# Patient Record
Sex: Female | Born: 1960 | Race: Black or African American | Hispanic: No | Marital: Single | Smoking: Former smoker
Health system: Southern US, Community
[De-identification: ages and names within clinical notes are randomized; demographics above are authoritative.]

## PROBLEM LIST (undated history)

## (undated) DIAGNOSIS — I10 Essential (primary) hypertension: Secondary | ICD-10-CM

## (undated) DIAGNOSIS — E119 Type 2 diabetes mellitus without complications: Secondary | ICD-10-CM

## (undated) DIAGNOSIS — H35039 Hypertensive retinopathy, unspecified eye: Secondary | ICD-10-CM

## (undated) DIAGNOSIS — E1140 Type 2 diabetes mellitus with diabetic neuropathy, unspecified: Secondary | ICD-10-CM

## (undated) HISTORY — PX: FOOT SURGERY: SHX648

## (undated) HISTORY — PX: CATARACT EXTRACTION: SUR2

## (undated) HISTORY — PX: EYE SURGERY: SHX253

## (undated) HISTORY — PX: TUBAL LIGATION: SHX77

## (undated) HISTORY — DX: Hypertensive retinopathy, unspecified eye: H35.039

---

## 2001-02-05 ENCOUNTER — Ambulatory Visit (HOSPITAL_COMMUNITY): Admission: RE | Admit: 2001-02-05 | Discharge: 2001-02-05 | Payer: Self-pay | Admitting: Family Medicine

## 2001-02-05 ENCOUNTER — Encounter: Payer: Self-pay | Admitting: Family Medicine

## 2016-04-23 ENCOUNTER — Encounter (HOSPITAL_COMMUNITY): Payer: Self-pay

## 2016-04-23 ENCOUNTER — Inpatient Hospital Stay (HOSPITAL_COMMUNITY)
Admission: EM | Admit: 2016-04-23 | Discharge: 2016-04-28 | DRG: 602 | Disposition: A | Discharge status: Home / Self Care | Payer: Medicaid - Out of State | Attending: Internal Medicine | Admitting: Internal Medicine

## 2016-04-23 DIAGNOSIS — L03311 Cellulitis of abdominal wall: Secondary | ICD-10-CM

## 2016-04-23 DIAGNOSIS — R739 Hyperglycemia, unspecified: Secondary | ICD-10-CM

## 2016-04-23 DIAGNOSIS — E1110 Type 2 diabetes mellitus with ketoacidosis without coma: Secondary | ICD-10-CM | POA: Diagnosis present

## 2016-04-23 DIAGNOSIS — L299 Pruritus, unspecified: Secondary | ICD-10-CM | POA: Diagnosis present

## 2016-04-23 DIAGNOSIS — E872 Acidosis, unspecified: Secondary | ICD-10-CM

## 2016-04-23 DIAGNOSIS — T368X5A Adverse effect of other systemic antibiotics, initial encounter: Secondary | ICD-10-CM | POA: Diagnosis present

## 2016-04-23 DIAGNOSIS — E1165 Type 2 diabetes mellitus with hyperglycemia: Secondary | ICD-10-CM

## 2016-04-23 DIAGNOSIS — Z833 Family history of diabetes mellitus: Secondary | ICD-10-CM

## 2016-04-23 DIAGNOSIS — L500 Allergic urticaria: Secondary | ICD-10-CM | POA: Diagnosis present

## 2016-04-23 DIAGNOSIS — L02211 Cutaneous abscess of abdominal wall: Principal | ICD-10-CM | POA: Diagnosis present

## 2016-04-23 DIAGNOSIS — IMO0002 Reserved for concepts with insufficient information to code with codable children: Secondary | ICD-10-CM

## 2016-04-23 DIAGNOSIS — E1140 Type 2 diabetes mellitus with diabetic neuropathy, unspecified: Secondary | ICD-10-CM | POA: Diagnosis present

## 2016-04-23 DIAGNOSIS — E1310 Other specified diabetes mellitus with ketoacidosis without coma: Secondary | ICD-10-CM | POA: Diagnosis present

## 2016-04-23 DIAGNOSIS — Z794 Long term (current) use of insulin: Secondary | ICD-10-CM

## 2016-04-23 DIAGNOSIS — I10 Essential (primary) hypertension: Secondary | ICD-10-CM | POA: Diagnosis present

## 2016-04-23 DIAGNOSIS — E119 Type 2 diabetes mellitus without complications: Secondary | ICD-10-CM

## 2016-04-23 HISTORY — DX: Essential (primary) hypertension: I10

## 2016-04-23 HISTORY — DX: Type 2 diabetes mellitus without complications: E11.9

## 2016-04-23 HISTORY — DX: Type 2 diabetes mellitus with diabetic neuropathy, unspecified: E11.40

## 2016-04-23 LAB — URINE MICROSCOPIC-ADD ON

## 2016-04-23 LAB — URINALYSIS, ROUTINE W REFLEX MICROSCOPIC
Bilirubin Urine: NEGATIVE
Glucose, UA: >1000 mg/dL — AB
Ketones, ur: >80 mg/dL — AB
Leukocytes, UA: NEGATIVE
Nitrite: NEGATIVE
Protein, ur: NEGATIVE mg/dL
Specific Gravity, Urine: 1.01 (ref 1.005–1.030)
pH: 6 (ref 5.0–8.0)

## 2016-04-23 MED ORDER — HYDROXYZINE HCL 50 MG/ML IM SOLN
50.0000 mg | Freq: Once | INTRAMUSCULAR | Status: AC
  Administered 2016-04-23: 50 mg via INTRAMUSCULAR
  Filled 2016-04-23: qty 1

## 2016-04-23 NOTE — ED Provider Notes (Signed)
[name] DEPT Provider Note   CSN: [id number] Arrival date & time: 04/23/16  2119  By signing my name below, I, Nicole Golden, attest that this documentation has been prepared under the direction and in the presence of [name], PA-C. Electronically Signed: Irene Golden, ED Scribe. 04/23/16. 10:08 PM.   History   Chief Complaint Chief Complaint  Patient presents with  . Abscess   The history is provided by the patient. No language interpreter was used.  Abscess  Location:  Torso Torso abscess location:  Abd RLQ Abscess quality: draining and redness   Duration:  2 days Progression:  Worsening Chronicity:  New Context: diabetes   Ineffective treatments:  Oral antibiotics Associated symptoms: no fever, no nausea and no vomiting   Rash   This is a new problem. The current episode started more than 2 days ago. The problem has been gradually worsening. The problem is associated with an unknown factor. There has been no fever. The rash is present on the left arm, back, torso, right arm and neck. The pain is mild. Associated symptoms include itching. She has tried antihistamines and steriods for the symptoms. The treatment provided no relief. Risk factors include new medications.   HPI Comments: Nicole Golden is a 55 y.o. Female with a hx of DM and HTN who presents to the Emergency Department complaining of a red abscess to the RLQ abdomen onset 2 days ago. She states that the area is sore and had discharge earlier today, but this has since stopped. Pt has been prescribed Cipro for her symptoms. Pt states that she is "itching" all over onset 4 days ago. She does not know the cause of the rash, but believes it could be related to the seasoning she used on her fajitas on 04/18/16. She was taking Benadryl for the itching but states that the itching became worse. She was seen at [facility] for her symptoms and prescribed steroids. She states that she had to return to the  hospital because the steroids caused her CBG to spike and she was dehydrated. Her itching had stopped before she received fluids for hydration. She was given the Cipro while at [facility] for abscess of the abdomen wall. Pt was discharged from [facility] this morning. She denies hx of similar symptoms. She denies known allergies.   Past Medical History:  Diagnosis Date  . Diabetes mellitus without complication ([place])   . Diabetic neuropathy ([place])   . Diabetic neuropathy ([place])   . Hypertension     There are no active problems to display for this patient.   Past Surgical History:  Procedure Laterality Date  . CATARACT EXTRACTION    . EYE SURGERY    . FOOT SURGERY    . tubual ligation      OB History    No data available       Home Medications    Prior to Admission medications   Not on File    Family History History reviewed. No pertinent family history.  Social History Social History  Substance Use Topics  . Smoking status: Never Smoker  . Smokeless tobacco: Never Used  . Alcohol use No     Allergies   Review of patient's allergies indicates no known allergies.   Review of Systems Review of Systems  Constitutional: Negative for chills and fever.  Gastrointestinal: Negative for nausea and vomiting.  Skin: Positive for itching, rash and wound.  All other systems reviewed and are negative.    Physical  Exam Updated Vital Signs BP 143/72 (BP Location: Left Arm)   Pulse 114   Temp 97.7 F (36.5 C) (Oral)   Resp 16   Ht 5\' 8"  (1.727 m)   Wt 173 lb (78.5 kg)   SpO2 99%   BMI 26.30 kg/m   Physical Exam  Constitutional: She is oriented to person, place, and time. She appears well-developed and well-nourished.  HENT:  Head: Normocephalic and atraumatic.  Eyes: EOM are normal. Pupils are equal, round, and reactive to light.  Neck: Normal range of motion. Neck supple.  Cardiovascular: Regular rhythm and normal heart sounds.  Tachycardia present.  Exam reveals  no gallop and no friction rub.   No murmur heard. Pulmonary/Chest: Effort normal and breath sounds normal. She has no wheezes.  Abdominal: Soft. There is no tenderness.  Musculoskeletal: Normal range of motion.  Neurological: She is alert and oriented to person, place, and time.  Skin: Skin is warm and dry. Rash noted. Rash is urticarial.  Symmetrical rise and fall of the chest with breathing multiple hives of the neck and back Few hives on the forearms, right and left lower extremities Hives noted on the abdomen Abscess of the right abdominal wall  Psychiatric: She has a normal mood and affect. Her behavior is normal.  Nursing note and vitals reviewed.    ED Treatments / Results  DIAGNOSTIC STUDIES: Oxygen Saturation is 99% on RA, normal by my interpretation.    COORDINATION OF CARE: 10:07 PM-Discussed treatment plan which includes labs, Benadryl, and phenergan with pt at bedside and pt agreed to plan.    Labs (all labs order ed are listed, but only abnormal results are displayed) Labs Reviewed - No data to display  EKG  EKG Interpretation None       Radiology No results found.  Procedures Procedures (including critical care time)  Medications Ordered in ED Medications - No data to display   Initial Impression / Assessment and Plan / ED Course  11:48 - Lab reports blood Hemolyzed. Results will be delayed.  I have reviewed the triage vital signs and the nursing notes.  Pertinent labs & imaging results that were available during my care of the patient were reviewed by me and considered in my medical decision making (see chart for details).  Clinical Course    **I have reviewed nursing notes, vital signs, and all appropriate lab and imaging results for this patient.*  Final Clinical Impressions(s)   Itching significantly improved with the intramuscular hydralazine. Vital signs reviewed.  BASIC metabolic panel shows a glucose to be elevated at 332. Sodium  is slightly low at 133, the CO2 is low at 18. The anion gap is 15. The complete blood count is normal. Urinalysis does not show evidence of urinary tract infection, but does show 1000 mg/daL of glucose.   I discussed the importance of monitoring her glucose with the patient. I've also discussed with her the need for her physician to closely monitor her because of the elevation in glucose and decrease in the carbon dioxide. I've also discussed with patient the need to use the Vistaril for itching. She will currently stop the Cipro and use doxycycline 2 times daily she will use warm tub soaks as well. Patient is to return to the emergency department if any increase in temperature, or signs of advancing infection.    Final diagnoses:  None  ntation, which was admitting scribed in my presence. The recorded information has been reviewed and is accurate.*  New Prescriptions New Prescriptions   No medications on file     [name], PA-C 04/24/16 [place], PA-C 04/24/16 [id number]    Rolland Porter, MD 04/24/16 [phone]

## 2016-04-23 NOTE — ED Triage Notes (Signed)
Abscess with redness to RLQ X2 days. Also c/o itching

## 2016-04-24 ENCOUNTER — Encounter (HOSPITAL_COMMUNITY): Payer: Self-pay

## 2016-04-24 DIAGNOSIS — L02211 Cutaneous abscess of abdominal wall: Secondary | ICD-10-CM | POA: Diagnosis present

## 2016-04-24 DIAGNOSIS — E1140 Type 2 diabetes mellitus with diabetic neuropathy, unspecified: Secondary | ICD-10-CM | POA: Diagnosis present

## 2016-04-24 DIAGNOSIS — E1010 Type 1 diabetes mellitus with ketoacidosis without coma: Secondary | ICD-10-CM | POA: Diagnosis not present

## 2016-04-24 DIAGNOSIS — L500 Allergic urticaria: Secondary | ICD-10-CM | POA: Diagnosis present

## 2016-04-24 DIAGNOSIS — I10 Essential (primary) hypertension: Secondary | ICD-10-CM | POA: Diagnosis present

## 2016-04-24 DIAGNOSIS — E0810 Diabetes mellitus due to underlying condition with ketoacidosis without coma: Secondary | ICD-10-CM

## 2016-04-24 DIAGNOSIS — L299 Pruritus, unspecified: Secondary | ICD-10-CM | POA: Diagnosis present

## 2016-04-24 DIAGNOSIS — E119 Type 2 diabetes mellitus without complications: Secondary | ICD-10-CM | POA: Diagnosis not present

## 2016-04-24 DIAGNOSIS — T368X5A Adverse effect of other systemic antibiotics, initial encounter: Secondary | ICD-10-CM | POA: Diagnosis present

## 2016-04-24 DIAGNOSIS — L03311 Cellulitis of abdominal wall: Secondary | ICD-10-CM

## 2016-04-24 DIAGNOSIS — Z794 Long term (current) use of insulin: Secondary | ICD-10-CM

## 2016-04-24 DIAGNOSIS — R109 Unspecified abdominal pain: Secondary | ICD-10-CM | POA: Diagnosis present

## 2016-04-24 DIAGNOSIS — E1310 Other specified diabetes mellitus with ketoacidosis without coma: Secondary | ICD-10-CM | POA: Diagnosis present

## 2016-04-24 DIAGNOSIS — Z833 Family history of diabetes mellitus: Secondary | ICD-10-CM | POA: Diagnosis not present

## 2016-04-24 DIAGNOSIS — IMO0002 Reserved for concepts with insufficient information to code with codable children: Secondary | ICD-10-CM

## 2016-04-24 DIAGNOSIS — E1165 Type 2 diabetes mellitus with hyperglycemia: Secondary | ICD-10-CM

## 2016-04-24 DIAGNOSIS — E1110 Type 2 diabetes mellitus with ketoacidosis without coma: Secondary | ICD-10-CM | POA: Diagnosis present

## 2016-04-24 LAB — BASIC METABOLIC PANEL
ANION GAP: 13 (ref 5–15)
ANION GAP: 15 (ref 5–15)
BUN: 16 mg/dL (ref 6–20)
BUN: 12 mg/dL (ref 6–20)
CHLORIDE: 105 mmol/L (ref 101–111)
CO2: 18 mmol/L — ABNORMAL LOW (ref 22–32)
CO2: 15 mmol/L — AB (ref 22–32)
Calcium: 8.9 mg/dL (ref 8.9–10.3)
Calcium: 7.8 mg/dL — ABNORMAL LOW (ref 8.9–10.3)
Chloride: 100 mmol/L — ABNORMAL LOW (ref 101–111)
Creatinine, Ser: 0.91 mg/dL (ref 0.44–1.00)
Creatinine, Ser: 0.79 mg/dL (ref 0.44–1.00)
GFR calc Af Amer: >60 mL/min (ref >60)
GFR calc non Af Amer: >60 mL/min (ref >60)
GFR calc non Af Amer: >60 mL/min (ref >60)
GLUCOSE: 249 mg/dL — AB (ref 65–99)
Glucose, Bld: 332 mg/dL — ABNORMAL HIGH (ref 65–99)
POTASSIUM: 3.8 mmol/L (ref 3.5–5.1)
Potassium: 3.4 mmol/L — ABNORMAL LOW (ref 3.5–5.1)
SODIUM: 133 mmol/L — AB (ref 135–145)
Sodium: 133 mmol/L — ABNORMAL LOW (ref 135–145)

## 2016-04-24 LAB — CBC
HEMATOCRIT: 34.7 % — AB (ref 36.0–46.0)
HEMOGLOBIN: 11.5 g/dL — AB (ref 12.0–15.0)
MCH: 26.1 pg (ref 26.0–34.0)
MCHC: 33.1 g/dL (ref 30.0–36.0)
MCV: 78.7 fL (ref 78.0–100.0)
Platelets: 193 10*3/uL (ref 150–400)
RBC: 4.41 MIL/uL (ref 3.87–5.11)
RDW: 13.7 % (ref 11.5–15.5)
WBC: 9 10*3/uL (ref 4.0–10.5)

## 2016-04-24 LAB — CBC WITH DIFFERENTIAL/PLATELET
BASOS ABS: 0 10*3/uL (ref 0.0–0.1)
BASOS PCT: 0 %
Eosinophils Absolute: 0.2 10*3/uL (ref 0.0–0.7)
Eosinophils Relative: 2 %
HEMATOCRIT: 36.6 % (ref 36.0–46.0)
HEMOGLOBIN: 12 g/dL (ref 12.0–15.0)
LYMPHS PCT: 18 %
Lymphs Abs: 1.5 10*3/uL (ref 0.7–4.0)
MCH: 26 pg (ref 26.0–34.0)
MCHC: 32.8 g/dL (ref 30.0–36.0)
MCV: 79.2 fL (ref 78.0–100.0)
MONO ABS: 0.9 10*3/uL (ref 0.1–1.0)
MONOS PCT: 11 %
NEUTROS ABS: 6.1 10*3/uL (ref 1.7–7.7)
NEUTROS PCT: 69 %
Platelets: 186 10*3/uL (ref 150–400)
RBC: 4.62 MIL/uL (ref 3.87–5.11)
RDW: 13.5 % (ref 11.5–15.5)
WBC: 8.7 10*3/uL (ref 4.0–10.5)

## 2016-04-24 LAB — GLUCOSE, CAPILLARY: Glucose-Capillary: 345 mg/dL — ABNORMAL HIGH (ref 65–99)

## 2016-04-24 LAB — CREATININE, SERUM
Creatinine, Ser: 0.91 mg/dL (ref 0.44–1.00)
GFR calc Af Amer: >60 mL/min (ref >60)

## 2016-04-24 MED ORDER — ONDANSETRON HCL 4 MG/2ML IJ SOLN: 4.0000 mg | Freq: Four times a day (QID) | INTRAMUSCULAR | Status: DC | PRN

## 2016-04-24 MED ORDER — ACETAMINOPHEN 650 MG RE SUPP: 650.0000 mg | Freq: Four times a day (QID) | RECTAL | Status: DC | PRN

## 2016-04-24 MED ORDER — ONDANSETRON HCL 4 MG PO TABS: 4.0000 mg | ORAL_TABLET | Freq: Four times a day (QID) | ORAL | Status: DC | PRN

## 2016-04-24 MED ORDER — BUPIVACAINE HCL (PF) 0.5 % IJ SOLN
10.0000 mL | Freq: Once | INTRAMUSCULAR | Status: AC
  Administered 2016-04-24: 10 mL
  Filled 2016-04-24: qty 30

## 2016-04-24 MED ORDER — INSULIN GLARGINE 100 UNIT/ML ~~LOC~~ SOLN
30.0000 [IU] | Freq: Every day | SUBCUTANEOUS | Status: DC
  Administered 2016-04-24: 30 [IU] via SUBCUTANEOUS
  Filled 2016-04-24 (×2): qty 0.3

## 2016-04-24 MED ORDER — HYDROCHLOROTHIAZIDE 25 MG PO TABS
25.0000 mg | ORAL_TABLET | Freq: Every day | ORAL | Status: DC
  Administered 2016-04-25 – 2016-04-28 (×4): 25 mg via ORAL
  Filled 2016-04-24 (×4): qty 1

## 2016-04-24 MED ORDER — IRBESARTAN 300 MG PO TABS
300.0000 mg | ORAL_TABLET | Freq: Every day | ORAL | Status: DC
  Administered 2016-04-25 – 2016-04-28 (×4): 300 mg via ORAL
  Filled 2016-04-24 (×4): qty 1

## 2016-04-24 MED ORDER — DOXYCYCLINE HYCLATE 100 MG PO CAPS: 100.0000 mg | ORAL_CAPSULE | Freq: Two times a day (BID) | ORAL | 0 refills | Status: DC

## 2016-04-24 MED ORDER — INSULIN ASPART 100 UNIT/ML ~~LOC~~ SOLN
5.0000 [IU] | Freq: Once | SUBCUTANEOUS | Status: AC
  Administered 2016-04-24: 5 [IU] via SUBCUTANEOUS
  Filled 2016-04-24: qty 1

## 2016-04-24 MED ORDER — SODIUM CHLORIDE 0.9 % IV BOLUS (SEPSIS)
1000.0000 mL | Freq: Once | INTRAVENOUS | Status: AC
  Administered 2016-04-24: 1000 mL via INTRAVENOUS

## 2016-04-24 MED ORDER — ACETAMINOPHEN 325 MG PO TABS: 650.0000 mg | ORAL_TABLET | Freq: Four times a day (QID) | ORAL | Status: DC | PRN

## 2016-04-24 MED ORDER — DOXYCYCLINE HYCLATE 100 MG PO TABS
100.0000 mg | ORAL_TABLET | Freq: Once | ORAL | Status: AC
  Administered 2016-04-24: 100 mg via ORAL
  Filled 2016-04-24: qty 1

## 2016-04-24 MED ORDER — ENOXAPARIN SODIUM 40 MG/0.4ML ~~LOC~~ SOLN
40.0000 mg | SUBCUTANEOUS | Status: DC
  Administered 2016-04-25 – 2016-04-27 (×3): 40 mg via SUBCUTANEOUS
  Filled 2016-04-24 (×4): qty 0.4

## 2016-04-24 MED ORDER — SODIUM CHLORIDE 0.9 % IV SOLN
INTRAVENOUS | Status: DC
  Administered 2016-04-24: 11:00:00 via INTRAVENOUS

## 2016-04-24 MED ORDER — INSULIN ASPART 100 UNIT/ML ~~LOC~~ SOLN
0.0000 [IU] | Freq: Three times a day (TID) | SUBCUTANEOUS | Status: DC
Start: 2016-04-24 — End: 2016-04-28
  Administered 2016-04-25: 8 [IU] via SUBCUTANEOUS
  Administered 2016-04-25: 11 [IU] via SUBCUTANEOUS
  Administered 2016-04-25: 15 [IU] via SUBCUTANEOUS
  Administered 2016-04-26 (×2): 8 [IU] via SUBCUTANEOUS
  Administered 2016-04-26: 11 [IU] via SUBCUTANEOUS
  Administered 2016-04-27: 8 [IU] via SUBCUTANEOUS
  Administered 2016-04-27: 3 [IU] via SUBCUTANEOUS
  Administered 2016-04-27 – 2016-04-28 (×3): 8 [IU] via SUBCUTANEOUS

## 2016-04-24 MED ORDER — SENNOSIDES-DOCUSATE SODIUM 8.6-50 MG PO TABS
1.0000 | ORAL_TABLET | Freq: Every evening | ORAL | Status: DC | PRN
  Administered 2016-04-27: 1 via ORAL
  Filled 2016-04-24: qty 1

## 2016-04-24 MED ORDER — INSULIN ASPART 100 UNIT/ML ~~LOC~~ SOLN
4.0000 [IU] | Freq: Three times a day (TID) | SUBCUTANEOUS | Status: DC
  Administered 2016-04-25 – 2016-04-26 (×4): 4 [IU] via SUBCUTANEOUS

## 2016-04-24 MED ORDER — VANCOMYCIN HCL IN DEXTROSE 1-5 GM/200ML-% IV SOLN
1000.0000 mg | Freq: Two times a day (BID) | INTRAVENOUS | Status: DC
  Administered 2016-04-25 – 2016-04-28 (×7): 1000 mg via INTRAVENOUS
  Filled 2016-04-24 (×7): qty 200

## 2016-04-24 MED ORDER — SODIUM CHLORIDE 0.9 % IV SOLN
INTRAVENOUS | Status: DC
  Administered 2016-04-24 – 2016-04-28 (×10): via INTRAVENOUS

## 2016-04-24 MED ORDER — INSULIN ASPART 100 UNIT/ML ~~LOC~~ SOLN
0.0000 [IU] | Freq: Every day | SUBCUTANEOUS | Status: DC
  Administered 2016-04-24 – 2016-04-26 (×3): 4 [IU] via SUBCUTANEOUS
  Administered 2016-04-27: 2 [IU] via SUBCUTANEOUS

## 2016-04-24 MED ORDER — HYDROXYZINE PAMOATE 25 MG PO CAPS: 25.0000 mg | ORAL_CAPSULE | Freq: Three times a day (TID) | ORAL | 0 refills | Status: DC | PRN

## 2016-04-24 MED ORDER — CANDESARTAN CILEXETIL-HCTZ 32-25 MG PO TABS: 1.0000 | ORAL_TABLET | Freq: Every day | ORAL | Status: DC

## 2016-04-24 MED ORDER — SODIUM CHLORIDE 0.9 % IV SOLN
1500.0000 mg | Freq: Once | INTRAVENOUS | Status: AC
  Administered 2016-04-24: 1500 mg via INTRAVENOUS
  Filled 2016-04-24: qty 1500

## 2016-04-24 NOTE — ED Notes (Signed)
Report given to Ander Purpura, RN unit 300

## 2016-04-24 NOTE — Progress Notes (Signed)
ANTIBIOTIC CONSULT NOTE - INITIAL  Pharmacy Consult for Vancomycin Indication: cellulitis  Allergies  Allergen Reactions  . Ciprofloxacin Hives    Patient Measurements: Height: 5\' 8"  (172.7 cm) Weight: 178 lb 8 oz (81 kg) IBW/kg (Calculated) : 63.9  Vital Signs: Temp: 98.2 F (36.8 C) (08/28 1451) Temp Source: Oral (08/28 1451) BP: 155/63 (08/28 1451) Pulse Rate: 91 (08/28 1451) Intake/Output from previous day: No intake/output data recorded. Intake/Output from this shift: Total I/O In: 1360 [P.O.:360; I.V.:1000] Out: 300 [Urine:300]  Labs:  Recent Labs  04/23/16 2345 04/24/16 0536  WBC 8.7  --   HGB 12.0  --   PLT 186  --   CREATININE 0.91 0.79   Estimated Creatinine Clearance: 87.6 mL/min (by C-G formula based on SCr of 0.8 mg/dL). No results for input(s): VANCOTROUGH, VANCOPEAK, VANCORANDOM, GENTTROUGH, GENTPEAK, GENTRANDOM, TOBRATROUGH, TOBRAPEAK, TOBRARND, AMIKACINPEAK, AMIKACINTROU, AMIKACIN in the last 72 hours.   Microbiology: No results found for this or any previous visit (from the past 720 hour(s)).  Medical History: Past Medical History:  Diagnosis Date  . Diabetes mellitus without complication ([place])   . Diabetic neuropathy ([place])   . Diabetic neuropathy ([place])   . Hypertension     Assessment: 55yo obese female.  Pt c/o abdominal wall cellulitis.  Good renal fxn.  Asked to initiate Vancomycin.  Pt failed OP Rx reportedly.  Goal of Therapy:  Vancomycin trough level 10-15 mcg/ml  Plan:  Vancomycin 1500mg  x 1 then 1000mg  IV q12hrs Check trough at SS F/U labs, micro, plan, cx's  Hart Robinsons A 04/24/2016,5:36 PM

## 2016-04-24 NOTE — ED Notes (Signed)
When pt sat up to sign her discharge papers, she began to throw up. Pt's temp was 100.0 and heart rate was 116. Pt stating she was about to thirst to death and she just didn't feel right. Dr. Tomi Bamberger notified and will go in and assess this pt herself.

## 2016-04-24 NOTE — Discharge Instructions (Signed)
Please use Vistaril every 6 hours as needed for itching. Please stop your Cipro. Please use doxycycline 2 times daily with food for your abscess. Please soak in a total warm Epsom salt water for about 15 minutes daily until the abscess has resolved. Vistaril may cause drowsiness, please use with caution.  Your lab work shows elevation in your glucose. You also have some other abnormalities that may indicate easy transition to an acidosis state. Please monitor your glucoses carefully. Please keep your physician aware of your condition and your numbers when you test her sugars. Please return to the emergency department if any emergent changes, problems, or concerns.

## 2016-04-24 NOTE — H&P (Signed)
History and Physical    Nicole Golden [id number] DOB: [birth year] DOA: 04/23/2016  Referring MD/NP/PA: Rolland Porter, EDP PCP: No primary care provider on file.  Patient coming from: Home  Chief Complaint: "[place]", right-sided abdominal pain  HPI: Nicole Golden is a 55 y.o. 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 She was just hospitalized at [facility] and discharged less than 48 hours ago. From what she describes it appears she was admitted with DKA and abdominal wall cellulitis, discharged home on Cipro. She returns today due to worsening right-sided abdominal pain with an area of fluctuance that has been drained in the ED, she also describes what appear to be hives. In the ED she was given Solu-Medrol, Benadryl, Pepcid and was going to be discharged home when she was noted to have a low-grade temperature followed by emesis.Upon reassessment by EDP, decision was made to drain abscess. She was also noted to be acidotic with a bicarbonate of 18 and an initial anion gap of 15 likely representing early DKA. Admission requested for further evaluation and management.  Past Medical/Surgical History: Past Medical History:  Diagnosis Date  . Diabetes mellitus without complication ([place])   . Diabetic neuropathy ([place])   . Diabetic neuropathy ([place])   . Hypertension     Past Surgical History:  Procedure Laterality Date  . CATARACT EXTRACTION    . EYE SURGERY    . FOOT SURGERY    . tubual ligation      Social History:  reports that she has never smoked. She has never used smokeless tobacco. She reports that she does not drink alcohol. Her drug history is not on file.  Allergies: No Known Allergies  Family History:  Diabetes in mother  Prior to Admission medications   Medication Sig Start Date End Date Taking? Authorizing Provider  acetaminophen (TYLENOL) 500 MG tablet Take 500 mg by mouth every 6 (six)  hours as needed for fever.   Yes Historical Provider, MD  Candesartan Cilexetil-HCTZ 32-25 MG TABS Take 1 tablet by mouth daily.   Yes Historical Provider, MD  ciprofloxacin (CIPRO) 500 MG tablet Take 1 tablet by mouth 2 (two) times daily. 04/23/16  Yes Historical Provider, MD  insulin glargine (LANTUS) 100 UNIT/ML injection Inject 30 Units into the skin at bedtime.   Yes Historical Provider, MD  insulin lispro (HUMALOG) 100 UNIT/ML injection Inject 10-20 Units into the skin 3 (three) times daily before meals.   Yes Historical Provider, MD  milk thistle 175 MG tablet Take 175 mg by mouth daily.   Yes Historical Provider, MD  doxycycline (VIBRAMYCIN) 100 MG capsule Take 1 capsule (100 mg total) by mouth 2 (two) times daily. 04/24/16   [name], PA-C  hydrOXYzine (VISTARIL) 25 MG capsule Take 1 capsule (25 mg total) by mouth 3 (three) times daily as needed. 04/24/16   [name], PA-C    Review of Systems: Constitutional: Denies  chills, diaphoresis, appetite change and fatigue.  HEENT: Denies photophobia, eye pain, redness, hearing loss, ear pain, congestion, sore throat, rhinorrhea, sneezing, mouth sores, trouble swallowing, neck pain, neck stiffness and tinnitus.   Respiratory: Denies SOB, DOE, cough, chest tightness,  and wheezing.   Cardiovascular: Denies chest pain, palpitations and leg swelling.  Gastrointestinal: Denies nausea, vomiting, abdominal pain, diarrhea, constipation, blood in stool and abdominal distention.  Genitourinary: Denies dysuria, urgency, frequency, hematuria, flank pain and difficulty urinating.  Endocrine: Denies: hot or cold intolerance, sweats, changes in hair  or nails, polyuria, polydipsia. Musculoskeletal: Denies myalgias, back pain, joint swelling, arthralgias and gait problem.  Skin: Denies pallor, rash and wound.  Neurological: Denies dizziness, seizures, syncope, weakness, light-headedness, numbness and headaches.  Hematological: Denies adenopathy. Easy  bruising, personal or family bleeding history  Psychiatric/Behavioral: Denies suicidal ideation, mood changes, confusion, nervousness, sleep disturbance and agitation    Physical Exam: Vitals:   04/24/16 0635 04/24/16 0816 04/24/16 0926 04/24/16 1451  BP: 146/74 150/71 (!) 140/58 (!) 155/63  Pulse: 104 105 100 91  Resp: 22 21 20 20   Temp: 99.8 F (37.7 C)  98.9 F (37.2 C) 98.2 F (36.8 C)  TempSrc: Oral  Oral Oral  SpO2: 99% 98% 98% 99%  Weight:   81 kg (178 lb 8 oz)   Height:   5\' 8"  (1.727 m)      Constitutional: NAD, calm, comfortable Eyes: PERRL, lids and conjunctivae normal ENMT: Mucous membranes are moist. Posterior pharynx clear of any exudate or lesions.Normal dentition.  Neck: normal, supple, no masses, no thyromegaly Respiratory: clear to auscultation bilaterally, no wheezing, no crackles. Normal respiratory effort. No accessory muscle use.  Cardiovascular: Regular rate and rhythm, no murmurs / rubs / gallops. No extremity edema. 2+ pedal pulses. No carotid bruits.  Abdomen: Distended, tenderness to superficial palpation of right side of abdomen, no masses palpated. . Bowel sounds positive.  Musculoskeletal: no clubbing / cyanosis. No joint deformity upper and lower extremities. Good ROM, no contractures. Normal muscle tone.  Skin: no rashes, lesions, ulcers. No induration Neurologic: CN 2-12 grossly intact. Sensation intact, DTR normal. Strength 5/5 in all 4.  Psychiatric: Normal judgment and insight. Alert and oriented x 3. Normal mood.    Labs on Admission: I have personally reviewed the following labs and imaging studies  CBC:  Recent Labs Lab 04/23/16 2345  WBC 8.7  NEUTROABS 6.1  HGB 12.0  HCT 36.6  MCV 79.2  PLT 99991111   Basic Metabolic Panel:  Recent Labs Lab 04/23/16 2345 04/24/16 0536  NA 133* 133*  K 3.8 3.4*  CL 100* 105  CO2 18* 15*  GLUCOSE 332* 249*  BUN 16 12  CREATININE 0.91 0.79  CALCIUM 8.9 7.8*   GFR: Estimated Creatinine  Clearance: 87.6 mL/min (by C-G formula based on SCr of 0.8 mg/dL). Liver Function Tests: No results for input(s): AST, ALT, ALKPHOS, BILITOT, PROT, ALBUMIN in the last 168 hours. No results for input(s): LIPASE, AMYLASE in the last 168 hours. No results for input(s): AMMONIA in the last 168 hours. Coagulation Profile: No results for input(s): INR, PROTIME in the last 168 hours. Cardiac Enzymes: No results for input(s): CKTOTAL, CKMB, CKMBINDEX, TROPONINI in the last 168 hours. BNP (last 3 results) No results for input(s): PROBNP in the last 8760 hours. HbA1C: No results for input(s): HGBA1C in the last 72 hours. CBG: No results for input(s): GLUCAP in the last 168 hours. Lipid Profile: No results for input(s): CHOL, HDL, LDLCALC, TRIG, CHOLHDL, LDLDIRECT in the last 72 hours. Thyroid Function Tests: No results for input(s): TSH, T4TOTAL, FREET4, T3FREE, THYROIDAB in the last 72 hours. Anemia Panel: No results for input(s): VITAMINB12, FOLATE, FERRITIN, TIBC, IRON, RETICCTPCT in the last 72 hours. Urine analysis:    Component Value Date/Time   COLORURINE YELLOW 04/23/2016 2156   APPEARANCEUR CLEAR 04/23/2016 2156   LABSPEC 1.010 04/23/2016 2156   PHURINE 6.0 04/23/2016 2156   GLUCOSEU >1000 (A) 04/23/2016 2156   HGBUR TRACE (A) 04/23/2016 2156   [place] NEGATIVE 04/23/2016 2156  KETONESUR >80 (A) 04/23/2016 2156   PROTEINUR NEGATIVE 04/23/2016 2156   NITRITE NEGATIVE 04/23/2016 2156   LEUKOCYTESUR NEGATIVE 04/23/2016 2156   Sepsis Labs: @LABRCNTIP (procalcitonin:4,lacticidven:4) )No results found for this or any previous visit (from the past 240 hour(s)).   Radiological Exams on Admission: No results found.  EKG: Independently reviewed. None obtained in ED  Assessment/Plan Principal Problem:   Abdominal wall cellulitis Active Problems:   DKA (diabetic ketoacidoses) (HCC)   IDDM (insulin dependent diabetes mellitus) ([place])    Abdominal wall cellulitis and  abscess -Has been drained in ED. -We'll place on IV vancomycin; believe that Cipro prescribed at [facility] was inadequate coverage.  Early DKA -Do not believe she needs insulin drip at this point. -We'll give copious amounts of IV fluids, 30 units of Lantus and a moderate sliding scale, recheck basic metabolic profile in a.m.  Allergic reaction/hives -No longer present, received Solu-Medrol, Benadryl and Pepcid in the ED. -Only new medication is Cipro, she took 2 doses of it before rash appeared. -Will place on allergy list.   DVT prophylaxis: Lovenox  Code Status: Full code  Family Communication: patient only  Disposition Plan: likely dc in 48-72 hours  Consults called: none  Admission status: inpatient    Time Spent: 75 minutes  Lelon Frohlich MD [facility] Pager [phone]  If 7PM-7AM, please contact night-coverage [email or web address] Password [facility]  04/24/2016, 5:03 PM

## 2016-04-25 DIAGNOSIS — E1010 Type 1 diabetes mellitus with ketoacidosis without coma: Secondary | ICD-10-CM

## 2016-04-25 LAB — COMPREHENSIVE METABOLIC PANEL
ALK PHOS: 115 U/L (ref 38–126)
ALT: 42 U/L (ref 14–54)
ANION GAP: 10 (ref 5–15)
AST: 38 U/L (ref 15–41)
Albumin: 2.8 g/dL — ABNORMAL LOW (ref 3.5–5.0)
BILIRUBIN TOTAL: 1.1 mg/dL (ref 0.3–1.2)
BUN: 10 mg/dL (ref 6–20)
CHLORIDE: 107 mmol/L (ref 101–111)
CO2: 18 mmol/L — ABNORMAL LOW (ref 22–32)
Calcium: 8.4 mg/dL — ABNORMAL LOW (ref 8.9–10.3)
Creatinine, Ser: 0.67 mg/dL (ref 0.44–1.00)
GFR calc Af Amer: >60 mL/min (ref >60)
GLUCOSE: 260 mg/dL — AB (ref 65–99)
POTASSIUM: 3.5 mmol/L (ref 3.5–5.1)
SODIUM: 135 mmol/L (ref 135–145)
TOTAL PROTEIN: 6.5 g/dL (ref 6.5–8.1)

## 2016-04-25 LAB — GLUCOSE, CAPILLARY
GLUCOSE-CAPILLARY: 217 mg/dL — AB (ref 65–99)
GLUCOSE-CAPILLARY: 248 mg/dL — AB (ref 65–99)
GLUCOSE-CAPILLARY: 318 mg/dL — AB (ref 65–99)
GLUCOSE-CAPILLARY: 328 mg/dL — AB (ref 65–99)
Glucose-Capillary: 281 mg/dL — ABNORMAL HIGH (ref 65–99)
Glucose-Capillary: 351 mg/dL — ABNORMAL HIGH (ref 65–99)

## 2016-04-25 LAB — HEMOGLOBIN A1C
HEMOGLOBIN A1C: 15.3 % — AB (ref 4.8–5.6)
Mean Plasma Glucose: 392 mg/dL

## 2016-04-25 MED ORDER — LIVING WELL WITH DIABETES BOOK
Freq: Once | Status: AC
  Administered 2016-04-25: 08:00:00
  Filled 2016-04-25: qty 1

## 2016-04-25 MED ORDER — INSULIN GLARGINE 100 UNIT/ML ~~LOC~~ SOLN
40.0000 [IU] | Freq: Every day | SUBCUTANEOUS | Status: DC
  Administered 2016-04-25 – 2016-04-27 (×3): 40 [IU] via SUBCUTANEOUS
  Filled 2016-04-25 (×6): qty 0.4

## 2016-04-25 NOTE — Progress Notes (Addendum)
Inpatient Diabetes Program Recommendations  AACE/ADA: New Consensus Statement on Inpatient Glycemic Control (2015)  Target Ranges:  Prepandial:   less than 140 mg/dL      [place] postprandial:   less than 180 mg/dL (1-2 hours)      Critically ill patients:  140 - 180 mg/dL   Results for Nicole Golden, Nicole Golden (MRN [id number]) as of 04/25/2016 07:36  Ref. Range 04/24/2016 20:05 04/25/2016 00:21 04/25/2016 04:25 04/25/2016 07:23  Glucose-Capillary Latest Ref Range: 65 - 99 mg/dL 345 (H) 248 (H) 217 (H) 281 (H)  Results for Nicole Golden, Nicole Golden (MRN [id number]) as of 04/25/2016 07:36  Ref. Range 04/23/2016 23:45 04/24/2016 05:36 04/25/2016 06:06  Hemoglobin A1C Latest Ref Range: 4.8 - 5.6 %  15.3 (H)   Glucose Latest Ref Range: 65 - 99 mg/dL 332 (H) 249 (H) 260 (H)   Review of Glycemic Control  Outpatient Diabetes medications: Lantus 30 units QHS, Humalog 10-20 units TID with meals Current orders for Inpatient glycemic control: Lantus 40 units QHS, Novolog 0-15 units TID with meals, Novolog 0-5 units QHS, Novolog 4 units TID with meals for meal coverage  Inpatient Diabetes Program Recommendations: Insulin-Basal: Noted Lantus was increased from 30 to 40 units today.  nsulin-Meal Coverage: Please consider increasing meal coverage to 10 units TID with meals. HgbA1C: A1C 15.3% on 04/24/16 indicating an average glucose of 392 mg/dl. In reviewing chart, note ED note that patient has recently been on steroids which has contributed to elevated A1C. Patient needs to follow up with PCP regarding improving diabetes control.  Addendum 04/25/16@16 :57-Spoke with patient about diabetes and home regimen for diabetes control. Patient reports that she was diagnosed with GDM during her pregnancy with her son and then with DM2 about 30 years ago.  Patient is followed by PCP in [place], [place]  for diabetes management and she also has an Musician in [place], [place] but does not see Endocrinologist very often. Currently she takes Lantus 30  units QHS and Humalog 10-20 units TID with meals as an outpatient for diabetes control.  Patient reports that she is taking insulin as prescribed.  Patient states that she checks her glucose 2-3 times per day and that it always over 200 mg/dl.   Discussed A1C results (15.3% on 04/24/16)  and explained that her current A1C indicates an average glucose of 392 mg/dl over the past 2-3 months. Do note that patient has recently been on steroids which is contributing to elevated A1C. Discussed glucose and A1C goals. Discussed importance of checking CBGs and maintaining good CBG control to prevent long-term and short-term complications. Explained how hyperglycemia leads to damage within blood vessels which lead to the common complications seen with uncontrolled diabetes. Stressed to the patient the importance of improving glycemic control to prevent further complications from uncontrolled diabetes. Discussed impact of nutrition, exercise, stress, sickness, and medications on diabetes control. Patient admits that she has a great deal of stress in her life and feels the stress is contributing to hyperglycemia. Patient reports that she tries to follow a carb modified diet for the most part.  Discussed carbohydrates, carbohydrate goals per day and meal, along with portion sizes. Patient reports that she plans to start exercising (walking) and once her abscess is healed up she would like to try water aerobics at the [facility]. Encouraged patient to check her glucose 4 times per day (before meals and at bedtime) and to keep a log book of glucose readings and insulin taken which she will need to take to doctor  appointments. Patient states that she plans to make follow up appointments with her PCP and Endocrinologist in [place] after she is discharged from the hospital. Patient stated that eventually she plans to move back to the [facility] area and will find a local PCP and Endocrinologist. Encouraged patient to see her  Endocrinologist in [place] as soon as she could for assistance with improve diabetes control.  Patient verbalized understanding of information discussed and she states that she has no further questions at this time related to diabetes.  Thanks, Barnie Alderman, RN, MSN, CDE Diabetes Coordinator Inpatient Diabetes Program [phone] (Team Pager from [place] to [place]) [phone] ([facility] office) [phone] [facility] office) [phone] [facility] office)

## 2016-04-25 NOTE — Progress Notes (Signed)
PROGRESS NOTE    Nicole Golden  [id number] DOB: Nov 02, 1960 DOA: 04/23/2016 PCP: No primary care provider on file.     Brief Narrative:  55 y/o woman admitted from home on 8/28 with abdominal wall cellulitis and early DKA.   Assessment & Plan:   Principal Problem:   Abdominal wall cellulitis Active Problems:   DKA (diabetic ketoacidoses) (HCC)   IDDM (insulin dependent diabetes mellitus) ([place])   Abdominal Wall Cellulitis and Abscess -Looks much better today. -Would continue IV vancomycin for at least another 24 hours. -Has been I and D in ED and will need packing removed prior to DC home.  Early DKA -Resolved with IVF and SQ insulin (was never on an insulin drip). -CBGs remain elevated. Will increase lantus to 40 units.   DVT prophylaxis: Lovenox Code Status: full code Family Communication: patient only Disposition Plan: likely home in 24-48 hours  Consultants:   None  Procedures:   None  Antimicrobials:   Vancomycin 8/28-->    Subjective: No complaints  Objective: Vitals:   04/24/16 1451 04/24/16 2138 04/25/16 0427 04/25/16 1300  BP: (!) 155/63 (!) 152/57 (!) 150/69 128/86  Pulse: 91 (!) 104 (!) 105 (!) 101  Resp: 20 20 20 20   Temp: 98.2 F (36.8 C) 99.5 F (37.5 C) 100.2 F (37.9 C) 98.5 F (36.9 C)  TempSrc: Oral Oral Oral Oral  SpO2: 99% 100% 100% 97%  Weight:      Height:        Intake/Output Summary (Last 24 hours) at 04/25/16 1520 Last data filed at 04/25/16 1200  Gross per 24 hour  Intake          1938.75 ml  Output             1900 ml  Net            38.75 ml   Filed Weights   04/23/16 2128 04/24/16 0926  Weight: 78.5 kg (173 lb) 81 kg (178 lb 8 oz)    Examination:  General exam: Alert, awake, oriented x 3 Respiratory system: Clear to auscultation. Respiratory effort normal. Cardiovascular system:RRR. No murmurs, rubs, gallops. Gastrointestinal system: Right sided abdominal wall cellulitis looks improved, less hard  and red. Central nervous system: Alert and oriented. No focal neurological deficits. Extremities: No C/C/E, +pedal pulses Skin: No rashes, lesions or ulcers Psychiatry: Judgement and insight appear normal. Mood & affect appropriate.     Data Reviewed: I have personally reviewed following labs and imaging studies  CBC:  Recent Labs Lab 04/23/16 2345 04/24/16 1908  WBC 8.7 9.0  NEUTROABS 6.1  --   HGB 12.0 11.5*  HCT 36.6 34.7*  MCV 79.2 78.7  PLT 186 0000000   Basic Metabolic Panel:  Recent Labs Lab 04/23/16 2345 04/24/16 0536 04/24/16 1908 04/25/16 0606  NA 133* 133*  --  135  K 3.8 3.4*  --  3.5  CL 100* 105  --  107  CO2 18* 15*  --  18*  GLUCOSE 332* 249*  --  260*  BUN 16 12  --  10  CREATININE 0.91 0.79 0.91 0.67  CALCIUM 8.9 7.8*  --  8.4*   GFR: Estimated Creatinine Clearance: 87.6 mL/min (by C-G formula based on SCr of 0.8 mg/dL). Liver Function Tests:  Recent Labs Lab 04/25/16 0606  AST 38  ALT 42  ALKPHOS 115  BILITOT 1.1  PROT 6.5  ALBUMIN 2.8*   No results for input(s): LIPASE, AMYLASE in the last 168  hours. No results for input(s): AMMONIA in the last 168 hours. Coagulation Profile: No results for input(s): INR, PROTIME in the last 168 hours. Cardiac Enzymes: No results for input(s): CKTOTAL, CKMB, CKMBINDEX, TROPONINI in the last 168 hours. BNP (last 3 results) No results for input(s): PROBNP in the last 8760 hours. HbA1C:  Recent Labs  04/24/16 0536  HGBA1C 15.3*   CBG:  Recent Labs Lab 04/24/16 2005 04/25/16 0021 04/25/16 0425 04/25/16 0723 04/25/16 1108  GLUCAP 345* 248* 217* 281* 351*   Lipid Profile: No results for input(s): CHOL, HDL, LDLCALC, TRIG, CHOLHDL, LDLDIRECT in the last 72 hours. Thyroid Function Tests: No results for input(s): TSH, T4TOTAL, FREET4, T3FREE, THYROIDAB in the last 72 hours. Anemia Panel: No results for input(s): VITAMINB12, FOLATE, FERRITIN, TIBC, IRON, RETICCTPCT in the last 72 hours. Urine  analysis:    Component Value Date/Time   COLORURINE YELLOW 04/23/2016 2156   APPEARANCEUR CLEAR 04/23/2016 2156   LABSPEC 1.010 04/23/2016 2156   PHURINE 6.0 04/23/2016 2156   GLUCOSEU >1000 (A) 04/23/2016 2156   HGBUR TRACE (A) 04/23/2016 2156   BILIRUBINUR NEGATIVE 04/23/2016 2156   KETONESUR >80 (A) 04/23/2016 2156   PROTEINUR NEGATIVE 04/23/2016 2156   NITRITE NEGATIVE 04/23/2016 2156   LEUKOCYTESUR NEGATIVE 04/23/2016 2156   Sepsis Labs: @LABRCNTIP (procalcitonin:4,lacticidven:4)  )No results found for this or any previous visit (from the past 240 hour(s)).       Radiology Studies: No results found.      Scheduled Meds: . enoxaparin (LOVENOX) injection  40 mg Subcutaneous Q24H  . irbesartan  300 mg Oral Daily   And  . hydrochlorothiazide  25 mg Oral Daily  . insulin aspart  0-15 Units Subcutaneous TID WC  . insulin aspart  0-5 Units Subcutaneous QHS  . insulin aspart  4 Units Subcutaneous TID WC  . insulin glargine  30 Units Subcutaneous QHS  . vancomycin  1,000 mg Intravenous Q12H   Continuous Infusions: . sodium chloride 125 mL/hr at 04/25/16 0904     LOS: 1 day    Time spent: 25 minutes. Greater than 50% of this time was spent in direct contact with the patient coordinating care.     Lelon Frohlich, MD [facility] Pager [phone]  If 7PM-7AM, please contact night-coverage [email or web address] Password [email or web address] 04/25/2016, 3:20 PM

## 2016-04-25 NOTE — Care Management Note (Signed)
Case Management Note  Patient Details  Name: Nicole Golden MRN: [id number] Date of Birth: [birth year]  Subjective/Objective:  Patient adm from home with abd wall cellulitis. She is from [place], she is here staying with son ot help with newborn. She still drives and is ind with ADL's. She has a PCP in [place]. She has a glucometer. She has medicaid, reports no issues affording medications.                   Action/Plan: Anticipate DC home with self care. No CM needs.    Expected Discharge Date:       04/27/2016           Expected Discharge Plan:  Home/Self Care  In-House Referral:  NA  Discharge planning Services  CM Consult  Post Acute Care Choice:  NA Choice offered to:  NA  DME Arranged:    DME Agency:     HH Arranged:    HH Agency:     Status of Service:  Completed, signed off  If discussed at [employer] of Stay Meetings, dates discussed:    Additional Comments:  Temara Lanum, Chauncey Reading, RN 04/25/2016, 3:20 PM

## 2016-04-26 DIAGNOSIS — Z794 Long term (current) use of insulin: Secondary | ICD-10-CM

## 2016-04-26 DIAGNOSIS — E119 Type 2 diabetes mellitus without complications: Secondary | ICD-10-CM

## 2016-04-26 DIAGNOSIS — L03311 Cellulitis of abdominal wall: Secondary | ICD-10-CM

## 2016-04-26 LAB — GLUCOSE, CAPILLARY
GLUCOSE-CAPILLARY: 283 mg/dL — AB (ref 65–99)
Glucose-Capillary: 291 mg/dL — ABNORMAL HIGH (ref 65–99)
Glucose-Capillary: 313 mg/dL — ABNORMAL HIGH (ref 65–99)
Glucose-Capillary: 307 mg/dL — ABNORMAL HIGH (ref 65–99)

## 2016-04-26 MED ORDER — INSULIN ASPART 100 UNIT/ML ~~LOC~~ SOLN
6.0000 [IU] | Freq: Three times a day (TID) | SUBCUTANEOUS | Status: DC
  Administered 2016-04-26 – 2016-04-28 (×7): 6 [IU] via SUBCUTANEOUS

## 2016-04-26 NOTE — Progress Notes (Signed)
PROGRESS NOTE    Nicole Golden  [id number] DOB: [birth year] DOA: 04/23/2016 [facility]      Brief Narrative: patient was admitted for abdominal wall cellulitis, having mild and early DKA, currently on IV Vancomycin, doing well.   Assessment & Plan:   Principal Problem:   Abdominal wall cellulitis Active Problems:   DKA (diabetic ketoacidoses) (HCC)   IDDM (insulin dependent diabetes mellitus) ([place])   Abdominal Wall Cellulitis and Abscess -Looks much better today. -Will continue with IV Vancomycin.  D/C home tomorrow on oral antibiotics.  -Has been I and D in ED and will need packing removed prior to DC home.  Early DKA -Resolved with IVF and SQ insulin (was never on an insulin drip). -CBGs remain elevated. Will increase lantus to 40 units.   DVT prophylaxis: Lovenox Code Status: full code Family Communication: patient only Disposition Plan: likely home in 24-48 hours   Antimicrobials: Anti-infectives    Start     Dose/Rate Route Frequency Ordered Stop   04/25/16 0600  vancomycin (VANCOCIN) IVPB 1000 mg/200 mL premix     1,000 mg 200 mL/hr over 60 Minutes Intravenous Every 12 hours 04/24/16 1734     04/24/16 1800  vancomycin (VANCOCIN) 1,500 mg in sodium chloride 0.9 % 500 mL IVPB     1,500 mg 250 mL/hr over 120 Minutes Intravenous  Once 04/24/16 1734 04/24/16 2332   04/24/16 0230  doxycycline (VIBRA-TABS) tablet 100 mg     100 mg Oral  Once 04/24/16 0215 04/24/16 0227   04/24/16 0000  doxycycline (VIBRAMYCIN) 100 MG capsule     100 mg Oral 2 times daily 04/24/16 0245         Subjective:  Feeling better.  Some drainage still from the abdominal wall.   Objective: Vitals:   04/25/16 0427 04/25/16 1300 04/25/16 2239 04/26/16 0543  BP: (!) 150/69 128/86 121/75 137/68  Pulse: (!) 105 (!) 101 (!) 104 93  Resp: 20 20 20 16   Temp: 100.2 F (37.9 C) 98.5 F (36.9 C) 99.5 F (37.5 C) 99 F (37.2 C)  TempSrc: Oral Oral Oral Oral  SpO2: 100% 97% 99% 96%    Weight:      Height:        Intake/Output Summary (Last 24 hours) at 04/26/16 1033 Last data filed at 04/25/16 1800  Gross per 24 hour  Intake          2459.17 ml  Output              300 ml  Net          2159.17 ml   Filed Weights   04/23/16 2128 04/24/16 0926  Weight: 78.5 kg (173 lb) 81 kg (178 lb 8 oz)    Examination:  General exam: Appears calm and comfortable  Respiratory system: Clear to auscultation. Respiratory effort normal. Cardiovascular system: S1 & S2 heard, RRR. No JVD, murmurs, rubs, gallops or clicks. No pedal edema. Gastrointestinal system: Abdomen is nondistended, soft and nontender. No organomegaly or masses felt. Normal bowel sounds heard. Central nervous system: Alert and oriented. No focal neurological deficits. Extremities: Symmetric 5 x 5 power. Skin: No rashes, lesions or ulcers Psychiatry: Judgement and insight appear normal. Mood & affect appropriate.   Data Reviewed: I have personally reviewed following labs and imaging studies  CBC:  Recent Labs Lab 04/23/16 2345 04/24/16 1908  WBC 8.7 9.0  NEUTROABS 6.1  --   HGB 12.0 11.5*  HCT 36.6 34.7*  MCV  79.2 78.7  PLT 186 0000000   Basic Metabolic Panel:  Recent Labs Lab 04/23/16 2345 04/24/16 0536 04/24/16 1908 04/25/16 0606  NA 133* 133*  --  135  K 3.8 3.4*  --  3.5  CL 100* 105  --  107  CO2 18* 15*  --  18*  GLUCOSE 332* 249*  --  260*  BUN 16 12  --  10  CREATININE 0.91 0.79 0.91 0.67  CALCIUM 8.9 7.8*  --  8.4*   Liver Function Tests:  Recent Labs Lab 04/25/16 0606  AST 38  ALT 42  ALKPHOS 115  BILITOT 1.1  PROT 6.5  ALBUMIN 2.8*    Recent Labs  04/24/16 0536  HGBA1C 15.3*   CBG:  Recent Labs Lab 04/25/16 0723 04/25/16 1108 04/25/16 1610 04/25/16 2023 04/26/16 0733  GLUCAP 281* 351* 318* 328* 291*   Radiology Studies: No results found.  Scheduled Meds: . enoxaparin (LOVENOX) injection  40 mg Subcutaneous Q24H  . irbesartan  300 mg Oral Daily   And   . hydrochlorothiazide  25 mg Oral Daily  . insulin aspart  0-15 Units Subcutaneous TID WC  . insulin aspart  0-5 Units Subcutaneous QHS  . insulin aspart  4 Units Subcutaneous TID WC  . insulin glargine  40 Units Subcutaneous QHS  . vancomycin  1,000 mg Intravenous Q12H   Continuous Infusions: . sodium chloride 125 mL/hr at 04/26/16 0819     LOS: 2 days   Wava Kildow, MD [facility].   If 7PM-7AM, please contact night-coverage [email or web address] Password [email or web address] 04/26/2016, 10:33 AM

## 2016-04-26 NOTE — Progress Notes (Signed)
2050 drsg changed to RIGHT ABD incision site, moderate purulent drainage noted to removed drsg. Clean guaze and CDD applied to incision site. Patient c/o slight tenderness to area surrounding incision site but tolerated drsg change well.

## 2016-04-27 LAB — GLUCOSE, CAPILLARY
GLUCOSE-CAPILLARY: 226 mg/dL — AB (ref 65–99)
Glucose-Capillary: 296 mg/dL — ABNORMAL HIGH (ref 65–99)
Glucose-Capillary: 286 mg/dL — ABNORMAL HIGH (ref 65–99)
Glucose-Capillary: 181 mg/dL — ABNORMAL HIGH (ref 65–99)

## 2016-04-27 MED ORDER — SULFAMETHOXAZOLE-TRIMETHOPRIM 800-160 MG PO TABS: 1.0000 | ORAL_TABLET | Freq: Two times a day (BID) | ORAL | 0 refills | Status: DC

## 2016-04-27 NOTE — Discharge Summary (Signed)
Physician Discharge Summary  IN SHIDLER [id number] DOB: 25-Dec-1960 DOA: 04/23/2016  PCP: No primary care provider on file.  Admit date: 04/23/2016 Discharge date: 04/27/2016  Admitted From: Home Disposition:  To home.   Recommendations for Outpatient Follow-up:  1. Follow up with PCP in 1-2 weeks, in [place]. 2. Please obtain BMP/CBC in one week  Home Health: None.  Equipment/Devices: None.  Discharge Condition: Improved CODE STATUS: Full code.  Diet recommendation: carb modified diet.   Brief/Interim Summary: patient was admitted by Dr Jerilee Hoh on Aug 28, 17 for abdominal wall cellulitis and abscess, along with having early DKA.  As per her H and P:  " Nicole Golden is a 55 y.o. 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 She was just hospitalized at [facility] and discharged less than 48 hours ago. From what she describes it appears she was admitted with DKA and abdominal wall cellulitis, discharged home on Cipro. She returns today due to worsening right-sided abdominal pain with an area of fluctuance that has been drained in the ED, she also describes what appear to be hives. In the ED she was given Solu-Medrol, Benadryl, Pepcid and was going to be discharged home when she was noted to have a low-grade temperature followed by emesis.Upon reassessment by EDP, decision was made to drain abscess. She was also noted to be acidotic with a bicarbonate of 18 and an initial anion gap of 15 likely representing early DKA. Admission requested for further evaluation and management.  Patient was admitted, given IV Van and IVF, and she improved.   She did not require IV drip of insulin, but her early DKA resolved.  She did have I and D of the local boil, and her cellulitis improved on IV antibiotics.  She has dressing changes q shift, and no longer had any drainage from her wound.  She is anxious to go back to [place], where  she lives, and I have recommended that she follow up with her PCP there later this week or early next week.  She will be given Bactrim DS to take 1 BID for another week.  Rash, nausea and vomiting as side effect discussed, and she should promptly discontinue the antibiotic and seek medical help should it happens.  She is stable for discharge, and will be discharge home today.  She will continue with her insulin regimen, and to check CBG 4x per day.   She will stay on carb modified diet.   No sweet.  Thank you and Good Day.   Discharge Diagnoses:  Principal Problem:   Abdominal wall cellulitis Active Problems:   DKA (diabetic ketoacidoses) (HCC)   IDDM (insulin dependent diabetes mellitus) ([place])    Discharge Instructions:  Take antibiotics and follow up with PCP.     Medication List    STOP taking these medications   ciprofloxacin 500 MG tablet Commonly known as:  CIPRO     TAKE these medications   acetaminophen 500 MG tablet Commonly known as:  TYLENOL Take 500 mg by mouth every 6 (six) hours as needed for fever.   Candesartan Cilexetil-HCTZ 32-25 MG Tabs Take 1 tablet by mouth daily.   doxycycline 100 MG capsule Commonly known as:  VIBRAMYCIN Take 1 capsule (100 mg total) by mouth 2 (two) times daily.   hydrOXYzine 25 MG capsule Commonly known as:  VISTARIL Take 1 capsule (25 mg total) by mouth 3 (three) times daily as needed.   insulin  glargine 100 UNIT/ML injection Commonly known as:  LANTUS Inject 30 Units into the skin at bedtime.   insulin lispro 100 UNIT/ML injection Commonly known as:  HUMALOG Inject 10-20 Units into the skin 3 (three) times daily before meals.   milk thistle 175 MG tablet Take 175 mg by mouth daily.   sulfamethoxazole-trimethoprim 800-160 MG tablet Commonly known as:  BACTRIM DS,SEPTRA DS Take 1 tablet by mouth 2 (two) times daily.       Allergies  Allergen Reactions  . Ciprofloxacin Hives     Consultations:  None.   Procedures/Studies:  No results found.    Subjective: Feeling much better.    Discharge Exam: Vitals:   04/26/16 2035 04/27/16 0536  BP: 135/65 134/61  Pulse: 94 85  Resp: 20   Temp: 99 F (37.2 C) 98.2 F (36.8 C)   Vitals:   04/26/16 0543 04/26/16 1311 04/26/16 2035 04/27/16 0536  BP: 137/68 138/63 135/65 134/61  Pulse: 93 90 94 85  Resp: 16 18 20    Temp: 99 F (37.2 C) 98.4 F (36.9 C) 99 F (37.2 C) 98.2 F (36.8 C)  TempSrc: Oral Oral Oral Oral  SpO2: 96% 100% 99% 97%  Weight:      Height:        General: Pt is alert, awake, not in acute distress Cardiovascular: RRR, S1/S2 +, no rubs, no gallops Respiratory: CTA bilaterally, no wheezing, no rhonchi Abdominal: Soft, NT, ND, bowel sounds + Extremities: no edema, no cyanosis    The results of significant diagnostics from this hospitalization (including imaging, microbiology, ancillary and laboratory) are listed below for reference.     Microbiology: No results found for this or any previous visit (from the past 240 hour(s)).   Labs: BNP (last 3 results) No results for input(s): BNP in the last 8760 hours. Basic Metabolic Panel:  Recent Labs Lab 04/23/16 2345 04/24/16 0536 04/24/16 1908 04/25/16 0606  NA 133* 133*  --  135  K 3.8 3.4*  --  3.5  CL 100* 105  --  107  CO2 18* 15*  --  18*  GLUCOSE 332* 249*  --  260*  BUN 16 12  --  10  CREATININE 0.91 0.79 0.91 0.67  CALCIUM 8.9 7.8*  --  8.4*   Liver Function Tests:  Recent Labs Lab 04/25/16 0606  AST 38  ALT 42  ALKPHOS 115  BILITOT 1.1  PROT 6.5  ALBUMIN 2.8*   CBC:  Recent Labs Lab 04/23/16 2345 04/24/16 1908  WBC 8.7 9.0  NEUTROABS 6.1  --   HGB 12.0 11.5*  HCT 36.6 34.7*  MCV 79.2 78.7  PLT 186 193   CBG:  Recent Labs Lab 04/26/16 0733 04/26/16 1205 04/26/16 1608 04/26/16 2141 04/27/16 0720  GLUCAP 291* 283* 313* 307* 296*   Urinalysis    Component Value Date/Time    COLORURINE YELLOW 04/23/2016 2156   APPEARANCEUR CLEAR 04/23/2016 2156   LABSPEC 1.010 04/23/2016 2156   PHURINE 6.0 04/23/2016 2156   GLUCOSEU >1000 (A) 04/23/2016 2156   HGBUR TRACE (A) 04/23/2016 2156   BILIRUBINUR NEGATIVE 04/23/2016 2156   KETONESUR >80 (A) 04/23/2016 2156   PROTEINUR NEGATIVE 04/23/2016 2156   NITRITE NEGATIVE 04/23/2016 2156   LEUKOCYTESUR NEGATIVE 04/23/2016 2156    Time coordinating discharge: Over 30 minutes  SIGNED:  Orvan Falconer, MD FACP [facility] 04/27/2016, 11:13 AM   If 7PM-7AM, please contact night-coverage [email or web address] Password [email or web address]

## 2016-04-27 NOTE — Progress Notes (Addendum)
Charted in wrong chart 

## 2016-04-27 NOTE — Progress Notes (Signed)
Inpatient Diabetes Program Recommendations  AACE/ADA: New Consensus Statement on Inpatient Glycemic Control (2015)  Target Ranges:  Prepandial:   less than 140 mg/dL      Peak postprandial:   less than 180 mg/dL (1-2 hours)      Critically ill patients:  140 - 180 mg/dL  Results for Nicole Golden, Nicole Golden (MRN [id number]) as of 04/27/2016 07:03  Ref. Range 04/26/2016 07:33 04/26/2016 12:05 04/26/2016 16:08 04/26/2016 21:41  Glucose-Capillary Latest Ref Range: 65 - 99 mg/dL 291 (H)  Novolog 12 units 283 (H)  Novolog 14 units 313 (H)  Novolog 17 units 307 (H)  Novolog 4 units  Lantus 40 units    Review of Glycemic Control  Diabetes history: DM2 Outpatient Diabetes medications: Lantus 30 units QHS, Humalog 10-20 units TID with meals Current orders for Inpatient glycemic control: Lantus 40 units QHS, Novolog 0-15 units TID with meals, Novolog 0-5 units QHS, Novolog 6 units TID with meals for meal coverage  Inpatient Diabetes Program Recommendations: Insulin - Basal: Please consider increasing Lantus to 45 units QHS. Insulin - Meal Coverage: Please consider increasing meal coverage to Novolog 15 units TID with meals.  Thanks, Barnie Alderman, RN, MSN, CDE Diabetes Coordinator Inpatient Diabetes Program [phone] (Team Pager from [place] to [place]) [phone] ([place] office) [phone] [facility] office) [phone] [facility] office)

## 2016-04-28 LAB — GLUCOSE, CAPILLARY
GLUCOSE-CAPILLARY: 262 mg/dL — AB (ref 65–99)
GLUCOSE-CAPILLARY: 252 mg/dL — AB (ref 65–99)

## 2016-04-28 MED ORDER — SULFAMETHOXAZOLE-TRIMETHOPRIM 800-160 MG PO TABS: 1.0000 | ORAL_TABLET | Freq: Two times a day (BID) | ORAL | 0 refills | Status: DC

## 2016-04-28 NOTE — Progress Notes (Signed)
Patient with orders to be discharge home. Discharge instructions given, patient verbalized understanding. Prescriptions given. Patient stable. Patient left in private vehicle with family.  

## 2016-04-28 NOTE — Progress Notes (Signed)
Inpatient Diabetes Program Recommendations  AACE/ADA: New Consensus Statement on Inpatient Glycemic Control (2015)  Target Ranges:  Prepandial:   less than 140 mg/dL      Peak postprandial:   less than 180 mg/dL (1-2 hours)      Critically ill patients:  140 - 180 mg/dL   Results for KIRRA, HEATER (MRN [id number]) as of 04/28/2016 09:43  Ref. Range 04/27/2016 07:20 04/27/2016 11:21 04/27/2016 16:26 04/27/2016 20:31 04/28/2016 08:05  Glucose-Capillary Latest Ref Range: 65 - 99 mg/dL 296 (H) 286 (H) 181 (H) 226 (H) 262 (H)   Review of Glycemic Control  Diabetes history: DM2 Outpatient Diabetes medications: Lantus 30 units QHS, Humalog 10-20 units TID with meals Current orders for Inpatient glycemic control: Lantus 40units QHS, Novolog 0-15 units TID with meals, Novolog 0-5 units QHS, Novolog 6 units TID with meals for meal coverage  Inpatient Diabetes Program Recommendations: Insulin - Basal: Please consider increasing Lantus to 45 units QHS. Insulin - Meal Coverage: Please consider increasing meal coverage to Novolog 12 units TID with meals.  Thanks, Barnie Alderman, RN, MSN, CDE Diabetes Coordinator Inpatient Diabetes Program [phone] (Team Pager from [place] to [place]) [phone] ([place] office) [phone] [facility] office) [phone] [facility] office)

## 2016-04-28 NOTE — Progress Notes (Signed)
Patient ready for discharge. Dr. Marin Comment notified. New order to discharge patient home.

## 2016-07-28 ENCOUNTER — Encounter (HOSPITAL_COMMUNITY): Payer: Self-pay | Admitting: *Deleted

## 2016-07-28 ENCOUNTER — Emergency Department (HOSPITAL_COMMUNITY)
Admission: EM | Admit: 2016-07-28 | Discharge: 2016-07-28 | Disposition: A | Discharge status: Home / Self Care | Payer: Medicaid Other | Attending: Emergency Medicine | Admitting: Emergency Medicine

## 2016-07-28 DIAGNOSIS — L02412 Cutaneous abscess of left axilla: Secondary | ICD-10-CM | POA: Insufficient documentation

## 2016-07-28 DIAGNOSIS — I10 Essential (primary) hypertension: Secondary | ICD-10-CM | POA: Diagnosis not present

## 2016-07-28 DIAGNOSIS — L02211 Cutaneous abscess of abdominal wall: Secondary | ICD-10-CM | POA: Diagnosis not present

## 2016-07-28 DIAGNOSIS — Z79899 Other long term (current) drug therapy: Secondary | ICD-10-CM | POA: Insufficient documentation

## 2016-07-28 DIAGNOSIS — Z794 Long term (current) use of insulin: Secondary | ICD-10-CM | POA: Diagnosis not present

## 2016-07-28 DIAGNOSIS — L02212 Cutaneous abscess of back [any part, except buttock]: Secondary | ICD-10-CM | POA: Diagnosis not present

## 2016-07-28 DIAGNOSIS — L0291 Cutaneous abscess, unspecified: Secondary | ICD-10-CM

## 2016-07-28 MED ORDER — DOXYCYCLINE HYCLATE 100 MG PO TABS
100.0000 mg | ORAL_TABLET | Freq: Once | ORAL | Status: AC
  Administered 2016-07-28: 100 mg via ORAL

## 2016-07-28 MED ORDER — IBUPROFEN 800 MG PO TABS
800.0000 mg | ORAL_TABLET | Freq: Once | ORAL | Status: AC
  Administered 2016-07-28: 800 mg via ORAL

## 2016-07-28 MED ORDER — IBUPROFEN 800 MG PO TABS
ORAL_TABLET | ORAL | Status: AC
  Filled 2016-07-28: qty 1

## 2016-07-28 MED ORDER — CEFTRIAXONE SODIUM 1 G IJ SOLR
INTRAMUSCULAR | Status: AC
  Filled 2016-07-28: qty 10

## 2016-07-28 MED ORDER — CEFTRIAXONE SODIUM 1 G IJ SOLR
1.0000 g | Freq: Once | INTRAMUSCULAR | Status: AC
  Administered 2016-07-28: 1 g via INTRAMUSCULAR

## 2016-07-28 MED ORDER — DOXYCYCLINE HYCLATE 100 MG PO TABS
ORAL_TABLET | ORAL | Status: AC
  Filled 2016-07-28: qty 1

## 2016-07-28 MED ORDER — STERILE WATER FOR INJECTION IJ SOLN
INTRAMUSCULAR | Status: AC
  Filled 2016-07-28: qty 10

## 2016-07-28 NOTE — Discharge Instructions (Signed)
Your vital signs within normal limits. Please use warm Epsom salt soaks for about 15-20 minutes daily until the abscess areas have resolved. Please finish your antibiotic. Use 600 mg of ibuprofen every 6 hours, may use Tylenol in between the ibuprofen doses if needed for soreness. Please see the clinic listed above for assistance or reevaluation. You were treated in the emergency department tonight with intramuscular Rocephin. Please return to the emergency department if any emergent changes, problems, or concerns.

## 2016-07-28 NOTE — ED Notes (Addendum)
Pt reports 4 abscesses to triage, but does not indicate that to this RN

## 2016-07-28 NOTE — ED Triage Notes (Signed)
Abscess to back, abdomen and under left arm, states eh was seen at [facility] for same and given an antibiotic, states she is not better

## 2016-07-28 NOTE — ED Notes (Signed)
Pt reports she is recently moved here- has no family practitioner and yet has been seen here previously for abscesses. She points to her back as well as her abd area as sites of abscesses. This RN cannot see nor palpate reddened raised areas on her back or abd area- nor does she mention other areas

## 2016-07-28 NOTE — ED Provider Notes (Signed)
[facility] DEPT Provider Note   CSN: [id number] Arrival date & time: 07/28/16  2030  By signing my name below, I, Jeanell Sparrow, attest that this documentation has been prepared under the direction and in the presence of non-physician practitioner, [name], PA-C. Electronically Signed: Jeanell Sparrow, Scribe. 07/28/2016. 10:01 PM.  History   Chief Complaint Chief Complaint  Patient presents with  . Abscess   The history is provided by the patient.  Abscess  Location:  Shoulder/arm and torso Shoulder/arm abscess location:  L axilla Torso abscess location:  Lower back, abd LLQ and abd RLQ Abscess quality: not draining   Red streaking: no   Duration:  7 days Progression:  Unchanged Chronicity:  Recurrent Relieved by:  Nothing Worsened by:  Nothing Ineffective treatments:  Oral antibiotics Associated symptoms: no fever   Risk factors: prior abscess    HPI Comments: Nicole Golden is a 55 y.o. female who presents to the Emergency Department complaining of a lower back bump that started about 6 days ago. She states that she was seen at [facility] and started on Doxycyline 3 days ago without relief or change in progression. She reports other small bumps to her abdomen and left armpit. She admits to a prior hx of abscess. She denies any fever or other complaints.   Past Medical History:  Diagnosis Date  . Diabetes mellitus without complication ([place])   . Diabetic neuropathy ([place])   . Diabetic neuropathy ([place])   . Hypertension     Patient Active Problem List   Diagnosis Date Noted  . DKA (diabetic ketoacidoses) ([place]) 04/24/2016  . Abdominal wall cellulitis 04/24/2016  . IDDM (insulin dependent diabetes mellitus) ([place]) 04/24/2016    Past Surgical History:  Procedure Laterality Date  . CATARACT EXTRACTION    . EYE SURGERY    . FOOT SURGERY    . tubual ligation      OB History    No data available       Home Medications    Prior to Admission medications     Medication Sig Start Date End Date Taking? Authorizing Provider  acetaminophen (TYLENOL) 500 MG tablet Take 500 mg by mouth every 6 (six) hours as needed for fever.    Historical Provider, MD  Candesartan Cilexetil-HCTZ 32-25 MG TABS Take 1 tablet by mouth daily.    Historical Provider, MD  doxycycline (VIBRAMYCIN) 100 MG capsule Take 1 capsule (100 mg total) by mouth 2 (two) times daily. 04/24/16   [name], PA-C  hydrOXYzine (VISTARIL) 25 MG capsule Take 1 capsule (25 mg total) by mouth 3 (three) times daily as needed. 04/24/16   [name], PA-C  insulin glargine (LANTUS) 100 UNIT/ML injection Inject 30 Units into the skin at bedtime.    Historical Provider, MD  insulin lispro (HUMALOG) 100 UNIT/ML injection Inject 10-20 Units into the skin 3 (three) times daily before meals.    Historical Provider, MD  milk thistle 175 MG tablet Take 175 mg by mouth daily.    Historical Provider, MD  sulfamethoxazole-trimethoprim (BACTRIM DS,SEPTRA DS) 800-160 MG tablet Take 1 tablet by mouth 2 (two) times daily. 04/28/16   Orvan Falconer, MD    Family History No family history on file.  Social History Social History  Substance Use Topics  . Smoking status: Never Smoker  . Smokeless tobacco: Never Used  . Alcohol use No     Allergies   Ciprofloxacin   Review of Systems Review of Systems  Constitutional: Negative for fever.  Skin:       abscess  All other systems reviewed and are negative.    Physical Exam Updated Vital Signs BP 137/75   Pulse 95   Temp 97.6 F (36.4 C)   Resp 18   Ht 5\' 8"  (1.727 m)   Wt 175 lb (79.4 kg)   SpO2 96%   BMI 26.61 kg/m   Physical Exam  Constitutional: She appears well-developed and well-nourished. No distress.  HENT:  Head: Normocephalic.  Eyes: Conjunctivae are normal.  Neck: Neck supple.  Cardiovascular: Normal rate and regular rhythm.  Exam reveals no gallop and no friction rub.   No murmur heard. Pulmonary/Chest: Effort normal. No  respiratory distress. She has no wheezes. She has no rales.  Abdominal: Soft.  Musculoskeletal: Normal range of motion.  Neurological: She is alert.  Skin: Skin is warm and dry.  Abscess with a scabbed area in the right lower back. No streaks. No drainage.  Small abcesses to the right and left mid abdomen. No red streaks appreciated.  Small abscess to the outer left axilla. No drainage. No red streak appreciated.   Psychiatric: She has a normal mood and affect.  Nursing note and vitals reviewed.    ED Treatments / Results  DIAGNOSTIC STUDIES: Oxygen Saturation is 96% on RA, normal by my interpretation.    COORDINATION OF CARE: 10:06 PM- Pt advised of plan for treatment and pt agrees.  Labs (all labs ordered are listed, but only abnormal results are displayed) Labs Reviewed - No data to display  EKG  EKG Interpretation None       Radiology No results found.  Procedures Procedures (including critical care time)  Medications Ordered in ED Medications - No data to display   Initial Impression / Assessment and Plan / ED Course  I have reviewed the triage vital signs and the nursing notes.  Pertinent labs & imaging results that were available during my care of the patient were reviewed by me and considered in my medical decision making (see chart for details).  Clinical Course     *I have reviewed nursing notes, vital signs, and all appropriate lab and imaging results for this patient.**  Final Clinical Impressions(s) / ED Diagnoses  Vital signs within normal limits. Patient has multiple areas of abscess present. They are not candidates for incision and drainage at this time. The patient has begun a prescription for doxycycline. Intramuscular Rocephin given in the emergency department. I've encouraged patient to use warm Epsom salt soaks. The patient is to see her primary physician or return to the emergency department if any changes, problems, or if the wounds are not  resolving. Patient is in agreement with this discharge plan.    Final diagnoses:  None    New Prescriptions New Prescriptions   No medications on file   **I personally performed the services described in this documentation, which was scribed in my presence. The recorded information has been reviewed and is accurate.[name], PA-C 07/29/16 [place], DO 07/31/16 2029

## 2018-01-24 DIAGNOSIS — K5900 Constipation, unspecified: Secondary | ICD-10-CM | POA: Diagnosis not present

## 2018-01-24 DIAGNOSIS — E119 Type 2 diabetes mellitus without complications: Secondary | ICD-10-CM | POA: Diagnosis not present

## 2018-01-30 DIAGNOSIS — Z87891 Personal history of nicotine dependence: Secondary | ICD-10-CM | POA: Diagnosis not present

## 2018-01-30 DIAGNOSIS — E1140 Type 2 diabetes mellitus with diabetic neuropathy, unspecified: Secondary | ICD-10-CM | POA: Diagnosis not present

## 2018-01-30 DIAGNOSIS — K5900 Constipation, unspecified: Secondary | ICD-10-CM | POA: Diagnosis not present

## 2018-01-30 DIAGNOSIS — Z794 Long term (current) use of insulin: Secondary | ICD-10-CM | POA: Diagnosis not present

## 2018-01-30 DIAGNOSIS — I10 Essential (primary) hypertension: Secondary | ICD-10-CM | POA: Diagnosis not present

## 2018-01-30 DIAGNOSIS — Z79899 Other long term (current) drug therapy: Secondary | ICD-10-CM | POA: Diagnosis not present

## 2018-02-04 DIAGNOSIS — D259 Leiomyoma of uterus, unspecified: Secondary | ICD-10-CM | POA: Diagnosis not present

## 2018-02-04 DIAGNOSIS — R1084 Generalized abdominal pain: Secondary | ICD-10-CM | POA: Diagnosis not present

## 2018-02-04 DIAGNOSIS — D250 Submucous leiomyoma of uterus: Secondary | ICD-10-CM | POA: Diagnosis not present

## 2018-02-04 DIAGNOSIS — D252 Subserosal leiomyoma of uterus: Secondary | ICD-10-CM | POA: Diagnosis not present

## 2018-02-11 ENCOUNTER — Encounter: Payer: Self-pay | Admitting: Gastroenterology

## 2018-02-14 DIAGNOSIS — Z6828 Body mass index (BMI) 28.0-28.9, adult: Secondary | ICD-10-CM | POA: Diagnosis not present

## 2018-02-14 DIAGNOSIS — B86 Scabies: Secondary | ICD-10-CM | POA: Diagnosis not present

## 2018-02-16 DIAGNOSIS — R21 Rash and other nonspecific skin eruption: Secondary | ICD-10-CM | POA: Diagnosis not present

## 2018-02-16 DIAGNOSIS — Z87891 Personal history of nicotine dependence: Secondary | ICD-10-CM | POA: Diagnosis not present

## 2018-02-16 DIAGNOSIS — E1140 Type 2 diabetes mellitus with diabetic neuropathy, unspecified: Secondary | ICD-10-CM | POA: Diagnosis not present

## 2018-02-16 DIAGNOSIS — E1165 Type 2 diabetes mellitus with hyperglycemia: Secondary | ICD-10-CM | POA: Diagnosis not present

## 2018-02-16 DIAGNOSIS — Z794 Long term (current) use of insulin: Secondary | ICD-10-CM | POA: Diagnosis not present

## 2018-02-19 DIAGNOSIS — D259 Leiomyoma of uterus, unspecified: Secondary | ICD-10-CM | POA: Diagnosis not present

## 2018-02-19 DIAGNOSIS — E782 Mixed hyperlipidemia: Secondary | ICD-10-CM | POA: Diagnosis not present

## 2018-02-19 DIAGNOSIS — I10 Essential (primary) hypertension: Secondary | ICD-10-CM | POA: Diagnosis not present

## 2018-02-19 DIAGNOSIS — E119 Type 2 diabetes mellitus without complications: Secondary | ICD-10-CM | POA: Diagnosis not present

## 2018-02-20 DIAGNOSIS — E782 Mixed hyperlipidemia: Secondary | ICD-10-CM | POA: Diagnosis not present

## 2018-02-20 DIAGNOSIS — Z6828 Body mass index (BMI) 28.0-28.9, adult: Secondary | ICD-10-CM | POA: Diagnosis not present

## 2018-02-20 DIAGNOSIS — Z1331 Encounter for screening for depression: Secondary | ICD-10-CM | POA: Diagnosis not present

## 2018-02-20 DIAGNOSIS — I10 Essential (primary) hypertension: Secondary | ICD-10-CM | POA: Diagnosis not present

## 2018-02-20 DIAGNOSIS — E119 Type 2 diabetes mellitus without complications: Secondary | ICD-10-CM | POA: Diagnosis not present

## 2018-02-20 DIAGNOSIS — Z1389 Encounter for screening for other disorder: Secondary | ICD-10-CM | POA: Diagnosis not present

## 2018-02-26 DIAGNOSIS — L280 Lichen simplex chronicus: Secondary | ICD-10-CM | POA: Diagnosis not present

## 2018-02-26 DIAGNOSIS — D2330 Other benign neoplasm of skin of unspecified part of face: Secondary | ICD-10-CM | POA: Diagnosis not present

## 2018-03-19 DIAGNOSIS — L280 Lichen simplex chronicus: Secondary | ICD-10-CM | POA: Diagnosis not present

## 2018-05-08 ENCOUNTER — Ambulatory Visit (INDEPENDENT_AMBULATORY_CARE_PROVIDER_SITE_OTHER): Payer: Medicare HMO | Admitting: Gastroenterology

## 2018-05-08 ENCOUNTER — Encounter: Payer: Self-pay | Admitting: Gastroenterology

## 2018-05-08 DIAGNOSIS — K5900 Constipation, unspecified: Secondary | ICD-10-CM | POA: Diagnosis not present

## 2018-05-08 NOTE — Assessment & Plan Note (Signed)
57 year old female with history of chronic constipation now worsening, failure of OTC agents. Xray from outside practice with moderate stool burden, no obstruction (May 2019). Discussed Miralax purge today, taking 1 capful every hour up to 4-6 doses, followed by 8 ounces of water after each dose. Start Linzess 290 mcg once daily tomorrow. Call with update. If linzess fails, will trial Amitiza. May need CT if persistent issues. Will attempt to obtain outside colonoscopy from [facility] that was done several years ago. Return in 3-4 months.

## 2018-05-08 NOTE — Progress Notes (Signed)
Primary Care Physician:  [facility] Primary Gastroenterologist:  Dr. Oneida Alar   Chief Complaint  Patient presents with  . Constipation    occas will have BM once a week but has been as long as 2 weeks. Lots of straining     HPI:   Nicole Golden is a 57 y.o. female presenting today at the request of [facility] secondary to constipation. She moved here from [place], [place] in 2016/2017. Last colonoscopy at [facility] several years ago, no polyps.   About 2-3 months ago started having more difficulty with constipation. Historically more constipated. Notes straining. Stomach feels hard. Stays bloated. When having a BM has to strain to have it. Taking Metamucil, eating sugar-free ice cream, which helps her go but "tears my stomach up". Taking Miralax as well.   No changes in medications. No rectal bleeding. No unexplained weight loss or lack of appetite.    Past Medical History:  Diagnosis Date  . Diabetes mellitus without complication ([place])   . Diabetic neuropathy ([place])   . Diabetic neuropathy ([place])   . Hypertension     Past Surgical History:  Procedure Laterality Date  . CATARACT EXTRACTION    . FOOT SURGERY     right foot  . TUBAL LIGATION      Current Outpatient Medications  Medication Sig Dispense Refill  . acetaminophen (TYLENOL) 500 MG tablet Take 500 mg by mouth every 6 (six) hours as needed for fever.    . betamethasone dipropionate (DIPROLENE) 0.05 % ointment Apply 1 application topically 2 (two) times daily.  1  . hydrochlorothiazide (HYDRODIURIL) 25 MG tablet Take 25 mg by mouth daily.    . insulin aspart protamine- aspart (NOVOLOG MIX 70/30) (70-30) 100 UNIT/ML injection Inject 10-16 Units into the skin. Each meal    . insulin glargine (LANTUS) 100 UNIT/ML injection Inject 43 Units into the skin 2 (two) times daily.     Marland Kitchen lovastatin (MEVACOR) 20 MG tablet Take 20 mg by mouth at bedtime.    . psyllium (METAMUCIL) 58.6 % powder Take 1 packet by mouth daily.      No current facility-administered medications for this visit.     Allergies as of 05/08/2018 - Review Complete 05/08/2018  Allergen Reaction Noted  . Ciprofloxacin Hives 04/24/2016    Family History  Problem Relation Age of Onset  . Colon cancer Neg Hx   . Colon polyps Neg Hx     Social History   Socioeconomic History  . Marital status: Single    Spouse name: Not on file  . Number of children: Not on file  . Years of education: Not on file  . Highest education level: Not on file  Occupational History  . Occupation: [employer]    Comment: in [place], first [place]  . Financial resource strain: Not on file  . Food insecurity:    Worry: Not on file    Inability: Not on file  . Transportation needs:    Medical: Not on file    Non-medical: Not on file  Tobacco Use  . Smoking status: Former Research scientist (life sciences)  . Smokeless tobacco: Never Used  Substance and Sexual Activity  . Alcohol use: Yes    Comment: occas  . Drug use: Never  . Sexual activity: Not on file  Lifestyle  . Physical activity:    Days per week: Not on file    Minutes per session: Not on file  . Stress: Not on file  Relationships  . Social connections:    Talks on phone: Not on file    Gets together: Not on file    Attends religious service: Not on file    Active member of club or organization: Not on file    Attends meetings of clubs or organizations: Not on file    Relationship status: Not on file  . Intimate partner violence:    Fear of current or ex partner: Not on file    Emotionally abused: Not on file    Physically abused: Not on file    Forced sexual activity: Not on file  Other Topics Concern  . Not on file  Social History Narrative  . Not on file    Review of Systems: Gen: Denies any fever, chills, fatigue, weight loss, lack of appetite.  CV: Denies chest pain, heart palpitations, peripheral edema, syncope.  Resp: Denies shortness of breath at rest or with exertion. Denies wheezing  or cough.  GI: see HPI  GU : Denies urinary burning, urinary frequency, urinary hesitancy MS: Denies joint pain, muscle weakness, cramps, or limitation of movement.  Derm: Denies rash, itching, dry skin Psych: Denies depression, anxiety, memory loss, and confusion Heme: Denies bruising, bleeding, and enlarged lymph nodes.  Physical Exam: BP (!) 168/74   Pulse 80   Temp (!) 97.1 F (36.2 C) (Oral)   Ht 5\' 8"  (1.727 m)   Wt 186 lb 9.6 oz (84.6 kg)   BMI 28.37 kg/m  General:   Alert and oriented. Pleasant and cooperative. Well-nourished and well-developed.  Head:  Normocephalic and atraumatic. Eyes:  Without icterus, sclera clear and conjunctiva pink.  Ears:  Normal auditory acuity. Nose:  No deformity, discharge,  or lesions. Mouth:  No deformity or lesions, oral mucosa pink.  Lungs:  Clear to auscultation bilaterally. No wheezes, rales, or rhonchi. No distress.  Heart:  S1, S2 present without murmurs appreciated.  Abdomen:  +BS, soft, rounded but non-tender. No HSM noted. No guarding or rebound. No masses appreciated.  Rectal:  Deferred  Msk:  Symmetrical without gross deformities. Normal posture. Extremities:  Without  edema. Neurologic:  Alert and  oriented x4 Psych:  Alert and cooperative. Normal mood and affect.  Outside xray May 2019: moderate stool burden. Calcified fibroid.

## 2018-05-08 NOTE — Patient Instructions (Signed)
Today: take Miralax 1 capful with a full 8 ounces of water every hour up to 4-6 doses. If you start having loose stool, then you can stop the doses.  Tomorrow, start taking Linzess 1 capsule on an empty stomach (I would suggest the afternoon so that you see how it works for you). On the day you are off, you can take it in the morning at least 30 minutes before eating.  I will try to get the colonoscopy reports from [facility].  I will see you back in 3-4 months!  Let me know how the samples work for you!  It was a pleasure to see you today. I strive to create trusting relationships with patients to provide genuine, compassionate, and quality care. I value your feedback. If you receive a survey regarding your visit,  I greatly appreciate you taking time to fill this out.   Annitta Needs, PhD, ANP-BC [facility]

## 2018-05-09 ENCOUNTER — Encounter: Payer: Self-pay | Admitting: Gastroenterology

## 2018-05-09 NOTE — Progress Notes (Signed)
cc'ed to pcp °

## 2018-05-15 DIAGNOSIS — S91211A Laceration without foreign body of right great toe with damage to nail, initial encounter: Secondary | ICD-10-CM | POA: Diagnosis not present

## 2018-05-15 DIAGNOSIS — S91311A Laceration without foreign body, right foot, initial encounter: Secondary | ICD-10-CM | POA: Diagnosis not present

## 2018-05-15 DIAGNOSIS — Z87891 Personal history of nicotine dependence: Secondary | ICD-10-CM | POA: Diagnosis not present

## 2018-05-15 DIAGNOSIS — G629 Polyneuropathy, unspecified: Secondary | ICD-10-CM | POA: Diagnosis not present

## 2018-05-15 DIAGNOSIS — Z79899 Other long term (current) drug therapy: Secondary | ICD-10-CM | POA: Diagnosis not present

## 2018-05-15 DIAGNOSIS — E119 Type 2 diabetes mellitus without complications: Secondary | ICD-10-CM | POA: Diagnosis not present

## 2018-05-15 DIAGNOSIS — Z794 Long term (current) use of insulin: Secondary | ICD-10-CM | POA: Diagnosis not present

## 2018-05-15 DIAGNOSIS — I10 Essential (primary) hypertension: Secondary | ICD-10-CM | POA: Diagnosis not present

## 2018-05-15 DIAGNOSIS — L84 Corns and callosities: Secondary | ICD-10-CM | POA: Diagnosis not present

## 2018-06-18 DIAGNOSIS — L280 Lichen simplex chronicus: Secondary | ICD-10-CM | POA: Diagnosis not present

## 2018-06-25 ENCOUNTER — Telehealth: Payer: Self-pay | Admitting: Gastroenterology

## 2018-06-25 MED ORDER — LINACLOTIDE 290 MCG PO CAPS: 290.0000 ug | ORAL_CAPSULE | Freq: Every day | ORAL | 3 refills | Status: DC

## 2018-06-25 NOTE — Telephone Encounter (Signed)
Forwarding to Anna Boone, NP.  

## 2018-06-25 NOTE — Telephone Encounter (Signed)
Patient tried linzess samples and they worked.  Would like a prescription sent to [facility] [place].  [phone]

## 2018-06-25 NOTE — Addendum Note (Signed)
Addended by: Annitta Needs on: 06/25/2018 03:13 PM   Modules accepted: Orders

## 2018-06-25 NOTE — Telephone Encounter (Signed)
Linzess 290 mcg sent to pharmacy. 

## 2018-08-05 DIAGNOSIS — M24575 Contracture, left foot: Secondary | ICD-10-CM | POA: Diagnosis not present

## 2018-08-05 DIAGNOSIS — L97512 Non-pressure chronic ulcer of other part of right foot with fat layer exposed: Secondary | ICD-10-CM | POA: Diagnosis not present

## 2018-08-05 DIAGNOSIS — M24574 Contracture, right foot: Secondary | ICD-10-CM | POA: Diagnosis not present

## 2018-08-05 DIAGNOSIS — L97521 Non-pressure chronic ulcer of other part of left foot limited to breakdown of skin: Secondary | ICD-10-CM | POA: Diagnosis not present

## 2018-08-08 DIAGNOSIS — E119 Type 2 diabetes mellitus without complications: Secondary | ICD-10-CM | POA: Diagnosis not present

## 2018-08-08 DIAGNOSIS — Z6828 Body mass index (BMI) 28.0-28.9, adult: Secondary | ICD-10-CM | POA: Diagnosis not present

## 2018-08-08 DIAGNOSIS — E782 Mixed hyperlipidemia: Secondary | ICD-10-CM | POA: Diagnosis not present

## 2018-08-08 DIAGNOSIS — I10 Essential (primary) hypertension: Secondary | ICD-10-CM | POA: Diagnosis not present

## 2018-08-08 DIAGNOSIS — Z6829 Body mass index (BMI) 29.0-29.9, adult: Secondary | ICD-10-CM | POA: Diagnosis not present

## 2018-08-27 DIAGNOSIS — E7800 Pure hypercholesterolemia, unspecified: Secondary | ICD-10-CM | POA: Diagnosis not present

## 2018-08-27 DIAGNOSIS — I10 Essential (primary) hypertension: Secondary | ICD-10-CM | POA: Diagnosis not present

## 2018-08-27 DIAGNOSIS — E109 Type 1 diabetes mellitus without complications: Secondary | ICD-10-CM | POA: Diagnosis not present

## 2018-08-27 DIAGNOSIS — Z794 Long term (current) use of insulin: Secondary | ICD-10-CM | POA: Diagnosis not present

## 2018-08-27 DIAGNOSIS — Z0100 Encounter for examination of eyes and vision without abnormal findings: Secondary | ICD-10-CM | POA: Diagnosis not present

## 2018-08-27 DIAGNOSIS — H524 Presbyopia: Secondary | ICD-10-CM | POA: Diagnosis not present

## 2018-08-27 DIAGNOSIS — H2510 Age-related nuclear cataract, unspecified eye: Secondary | ICD-10-CM | POA: Diagnosis not present

## 2018-08-27 DIAGNOSIS — E1065 Type 1 diabetes mellitus with hyperglycemia: Secondary | ICD-10-CM | POA: Diagnosis not present

## 2018-08-30 DIAGNOSIS — E119 Type 2 diabetes mellitus without complications: Secondary | ICD-10-CM | POA: Diagnosis not present

## 2018-08-30 DIAGNOSIS — E782 Mixed hyperlipidemia: Secondary | ICD-10-CM | POA: Diagnosis not present

## 2018-08-30 DIAGNOSIS — I10 Essential (primary) hypertension: Secondary | ICD-10-CM | POA: Diagnosis not present

## 2018-09-03 DIAGNOSIS — Z6828 Body mass index (BMI) 28.0-28.9, adult: Secondary | ICD-10-CM | POA: Diagnosis not present

## 2018-09-03 DIAGNOSIS — Z01419 Encounter for gynecological examination (general) (routine) without abnormal findings: Secondary | ICD-10-CM | POA: Diagnosis not present

## 2018-09-10 ENCOUNTER — Ambulatory Visit (INDEPENDENT_AMBULATORY_CARE_PROVIDER_SITE_OTHER): Payer: Medicare HMO | Admitting: Gastroenterology

## 2018-09-10 ENCOUNTER — Encounter: Payer: Self-pay | Admitting: Gastroenterology

## 2018-09-10 VITALS — BP 155/71 | HR 98 | Temp 97.6°F | Ht 68.0 in | Wt 186.0 lb

## 2018-09-10 DIAGNOSIS — K5900 Constipation, unspecified: Secondary | ICD-10-CM

## 2018-09-10 NOTE — Patient Instructions (Signed)
You can open the Linzess capsule and approximate half, stirring contents into applesauce or liquid if you would like.  We will see you back in 1 year!  Call if any issues or concerns in the meantime!  I enjoyed seeing you again today! As you know, I value our relationship and want to provide genuine, compassionate, and quality care. I welcome your feedback. If you receive a survey regarding your visit,  I greatly appreciate you taking time to fill this out. See you next time!  Annitta Needs, PhD, ANP-BC [facility]

## 2018-09-10 NOTE — Progress Notes (Signed)
cc'ed to pcp °

## 2018-09-10 NOTE — Assessment & Plan Note (Addendum)
Excellent results with Linzess 290 mcg, in fact sometimes overshooting the mark. Discussed opening capsule if needed. Will attempt to retrieve last colonoscopy reports from [facility]. Return in 1 year or sooner if needed.   Addendum 10/23/18: unable to retrieve colonoscopy reports after multiple attempts. Will discuss routine screening at next visit.

## 2018-09-10 NOTE — Progress Notes (Signed)
Referring Provider: [facility] Primary Care Physician:  [facility] Primary GI: Dr. Oneida Alar   Chief Complaint  Patient presents with  . Constipation    doing better    HPI:   Nicole Golden is a 59 y.o. female presenting today with a history of chronic constipation, last seen in Sept 2019. Started on Linzess 290 mcg at that time. Last colonoscopy at [facility] several years ago per patient. Still have not received reports.   BMs in the morning without any help sometimes. Drinking more water. Eats a lot of veggies. [place]. Constipation resolved on Linzess and actually taking prn. Sometimes with diarrhea. Overall feeling very encouraged. No rectal bleeding. Feels less bloated.   Past Medical History:  Diagnosis Date  . Diabetes mellitus without complication ([place])   . Diabetic neuropathy ([place])   . Diabetic neuropathy ([place])   . Hypertension     Past Surgical History:  Procedure Laterality Date  . CATARACT EXTRACTION    . FOOT SURGERY     right foot  . TUBAL LIGATION      Current Outpatient Medications  Medication Sig Dispense Refill  . acetaminophen (TYLENOL) 500 MG tablet Take 500 mg by mouth every 6 (six) hours as needed for fever.    . hydrochlorothiazide (HYDRODIURIL) 25 MG tablet Take 25 mg by mouth daily.    . Insulin Aspart (NOVOLOG FLEXPEN [place]) Inject 10-16 Units into the skin 3 (three) times daily.    . insulin aspart protamine- aspart (NOVOLOG MIX 70/30) (70-30) 100 UNIT/ML injection Inject 10-16 Units into the skin. Each meal    . insulin glargine (LANTUS) 100 UNIT/ML injection Inject 43 Units into the skin 2 (two) times daily.     Marland Kitchen linaclotide (LINZESS) 290 MCG CAPS capsule Take 1 capsule (290 mcg total) by mouth daily before breakfast. (Patient taking differently: Take 290 mcg by mouth as needed. ) 90 capsule 3  . lovastatin (MEVACOR) 20 MG tablet Take 20 mg by mouth at bedtime.     No current facility-administered  medications for this visit.     Allergies as of 09/10/2018 - Review Complete 09/10/2018  Allergen Reaction Noted  . Ciprofloxacin Hives 04/24/2016    Family History  Problem Relation Age of Onset  . Colon cancer Neg Hx   . Colon polyps Neg Hx     Social History   Socioeconomic History  . Marital status: Single    Spouse name: Not on file  . Number of children: Not on file  . Years of education: Not on file  . Highest education level: Not on file  Occupational History  . Occupation: [employer]    Comment: in [place], first [place]  . Financial resource strain: Not on file  . Food insecurity:    Worry: Not on file    Inability: Not on file  . Transportation needs:    Medical: Not on file    Non-medical: Not on file  Tobacco Use  . Smoking status: Former Research scientist (life sciences)  . Smokeless tobacco: Never Used  Substance and Sexual Activity  . Alcohol use: Yes    Comment: occas  . Drug use: Never  . Sexual activity: Not on file  Lifestyle  . Physical activity:    Days per week: Not on file    Minutes per session: Not on file  . Stress: Not on file  Relationships  . Social connections:    Talks on phone: Not on  file    Gets together: Not on file    Attends religious service: Not on file    Active member of club or organization: Not on file    Attends meetings of clubs or organizations: Not on file    Relationship status: Not on file  Other Topics Concern  . Not on file  Social History Narrative  . Not on file    Review of Systems: Gen: Denies fever, chills, anorexia. Denies fatigue, weakness, weight loss.  CV: Denies chest pain, palpitations, syncope, peripheral edema, and claudication. Resp: Denies dyspnea at rest, cough, wheezing, coughing up blood, and pleurisy. GI: see HPI  Derm: Denies rash, itching, dry skin Psych: Denies depression, anxiety, memory loss, confusion. No homicidal or suicidal ideation.  Heme: Denies bruising, bleeding, and enlarged lymph  nodes.  Physical Exam: BP (!) 155/71   Pulse 98   Temp 97.6 F (36.4 C) (Oral)   Ht 5\' 8"  (1.727 m)   Wt 186 lb (84.4 kg)   BMI 28.28 kg/m  General:   Alert and oriented. No distress noted. Pleasant and cooperative.  Head:  Normocephalic and atraumatic. Eyes:  Conjuctiva clear without scleral icterus. Mouth:  Oral mucosa pink and moist.  Abdomen:  +BS, soft, non-tender and non-distended. No rebound or guarding. No HSM or masses noted. Msk:  Symmetrical without gross deformities. Normal posture. Extremities:  Without edema. Neurologic:  Alert and  oriented x4 Psych:  Alert and cooperative. Normal mood and affect.

## 2018-09-18 DIAGNOSIS — L309 Dermatitis, unspecified: Secondary | ICD-10-CM | POA: Diagnosis not present

## 2018-09-18 DIAGNOSIS — S91302A Unspecified open wound, left foot, initial encounter: Secondary | ICD-10-CM | POA: Diagnosis not present

## 2019-01-22 DIAGNOSIS — L84 Corns and callosities: Secondary | ICD-10-CM | POA: Diagnosis not present

## 2019-01-22 DIAGNOSIS — E119 Type 2 diabetes mellitus without complications: Secondary | ICD-10-CM | POA: Diagnosis not present

## 2019-01-23 DIAGNOSIS — L97512 Non-pressure chronic ulcer of other part of right foot with fat layer exposed: Secondary | ICD-10-CM | POA: Diagnosis not present

## 2019-01-23 DIAGNOSIS — M2041 Other hammer toe(s) (acquired), right foot: Secondary | ICD-10-CM | POA: Diagnosis not present

## 2019-02-10 DIAGNOSIS — L97512 Non-pressure chronic ulcer of other part of right foot with fat layer exposed: Secondary | ICD-10-CM | POA: Diagnosis not present

## 2019-02-13 DIAGNOSIS — L97511 Non-pressure chronic ulcer of other part of right foot limited to breakdown of skin: Secondary | ICD-10-CM | POA: Diagnosis not present

## 2019-04-04 DIAGNOSIS — L600 Ingrowing nail: Secondary | ICD-10-CM | POA: Diagnosis not present

## 2019-04-04 DIAGNOSIS — M79671 Pain in right foot: Secondary | ICD-10-CM | POA: Diagnosis not present

## 2019-04-04 DIAGNOSIS — M25774 Osteophyte, right foot: Secondary | ICD-10-CM | POA: Diagnosis not present

## 2019-04-04 DIAGNOSIS — M79672 Pain in left foot: Secondary | ICD-10-CM | POA: Diagnosis not present

## 2019-04-18 DIAGNOSIS — L303 Infective dermatitis: Secondary | ICD-10-CM | POA: Diagnosis not present

## 2019-04-18 DIAGNOSIS — B353 Tinea pedis: Secondary | ICD-10-CM | POA: Diagnosis not present

## 2019-05-08 DIAGNOSIS — B351 Tinea unguium: Secondary | ICD-10-CM | POA: Diagnosis not present

## 2019-05-08 DIAGNOSIS — M7989 Other specified soft tissue disorders: Secondary | ICD-10-CM | POA: Diagnosis not present

## 2019-05-08 DIAGNOSIS — L600 Ingrowing nail: Secondary | ICD-10-CM | POA: Diagnosis not present

## 2019-07-01 ENCOUNTER — Other Ambulatory Visit (HOSPITAL_COMMUNITY): Payer: Self-pay | Admitting: Physician Assistant

## 2019-07-01 DIAGNOSIS — Z1231 Encounter for screening mammogram for malignant neoplasm of breast: Secondary | ICD-10-CM

## 2019-07-14 DIAGNOSIS — L97512 Non-pressure chronic ulcer of other part of right foot with fat layer exposed: Secondary | ICD-10-CM | POA: Diagnosis not present

## 2019-07-14 DIAGNOSIS — B957 Other staphylococcus as the cause of diseases classified elsewhere: Secondary | ICD-10-CM | POA: Diagnosis not present

## 2019-07-14 DIAGNOSIS — B952 Enterococcus as the cause of diseases classified elsewhere: Secondary | ICD-10-CM | POA: Diagnosis not present

## 2019-07-14 DIAGNOSIS — Z1611 Resistance to penicillins: Secondary | ICD-10-CM | POA: Diagnosis not present

## 2019-07-14 DIAGNOSIS — M2041 Other hammer toe(s) (acquired), right foot: Secondary | ICD-10-CM | POA: Diagnosis not present

## 2019-07-14 DIAGNOSIS — B965 Pseudomonas (aeruginosa) (mallei) (pseudomallei) as the cause of diseases classified elsewhere: Secondary | ICD-10-CM | POA: Diagnosis not present

## 2019-07-21 ENCOUNTER — Other Ambulatory Visit: Payer: Self-pay

## 2019-07-21 ENCOUNTER — Encounter (HOSPITAL_COMMUNITY): Payer: Self-pay

## 2019-07-21 ENCOUNTER — Ambulatory Visit (HOSPITAL_COMMUNITY)
Admission: RE | Admit: 2019-07-21 | Discharge: 2019-07-21 | Disposition: A | Discharge status: Home / Self Care | Payer: Medicare HMO | Source: Ambulatory Visit | Attending: Physician Assistant | Admitting: Physician Assistant

## 2019-07-21 DIAGNOSIS — Z1231 Encounter for screening mammogram for malignant neoplasm of breast: Secondary | ICD-10-CM | POA: Insufficient documentation

## 2019-07-21 IMAGING — MG DIGITAL SCREENING BILAT W/ TOMO W/ CAD
6 of 10 series · 6 of 30 positions shown · non-contrast
Comparison: None
COMPARISON: None

Addendum:
CLINICAL DATA: Screening.

EXAM:
DIGITAL SCREENING BILATERAL MAMMOGRAM WITH TOMO AND CAD

[R MLO synth-2D (1 of 2)]
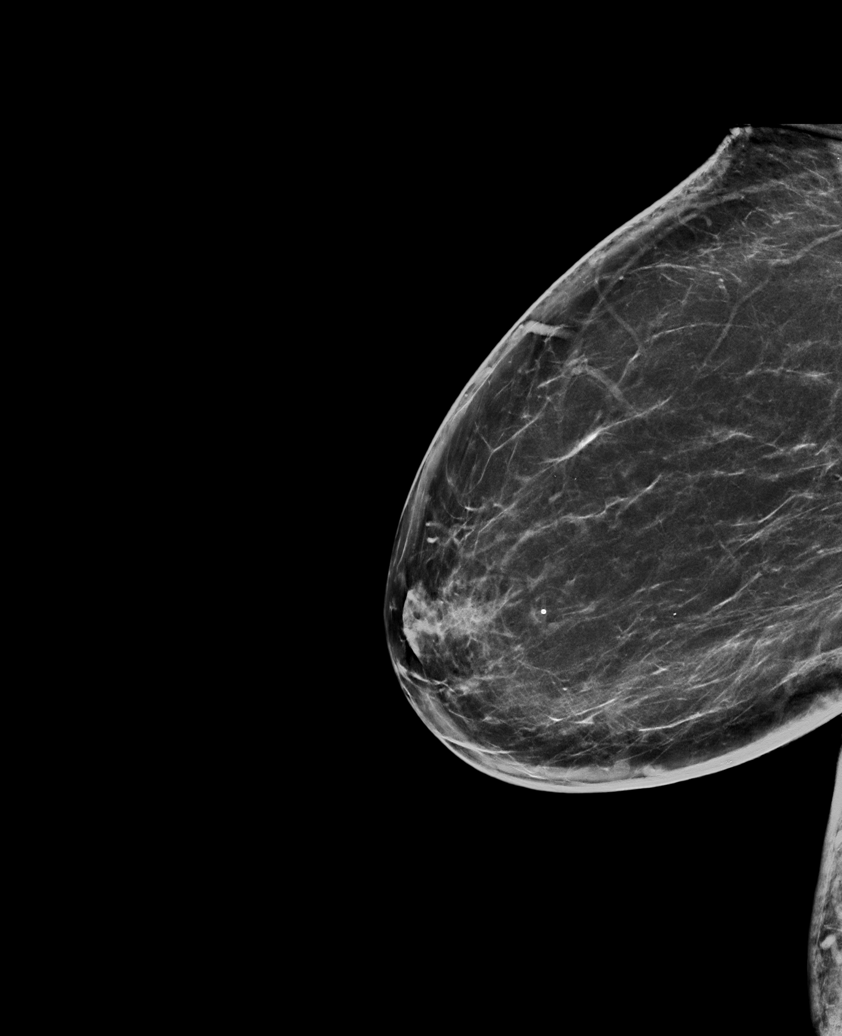

[R MLO synth-2D (2 of 2)]
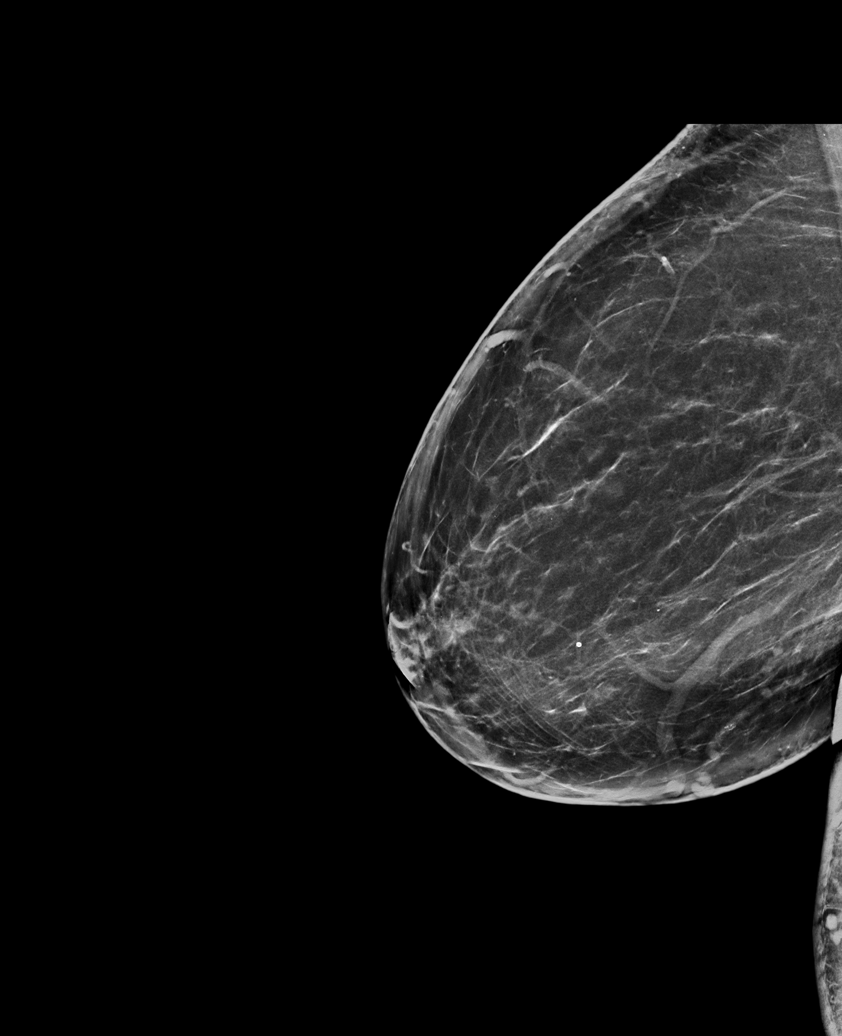

[R CC synth-2D]
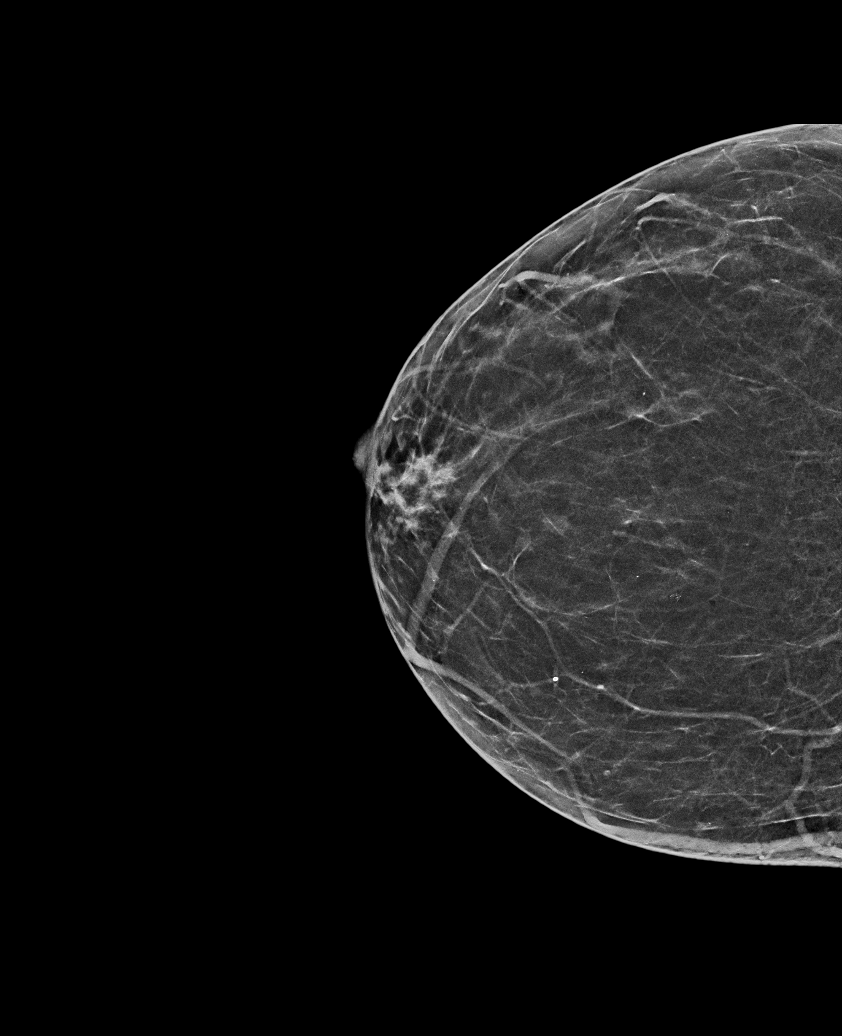

[L MLO synth-2D]
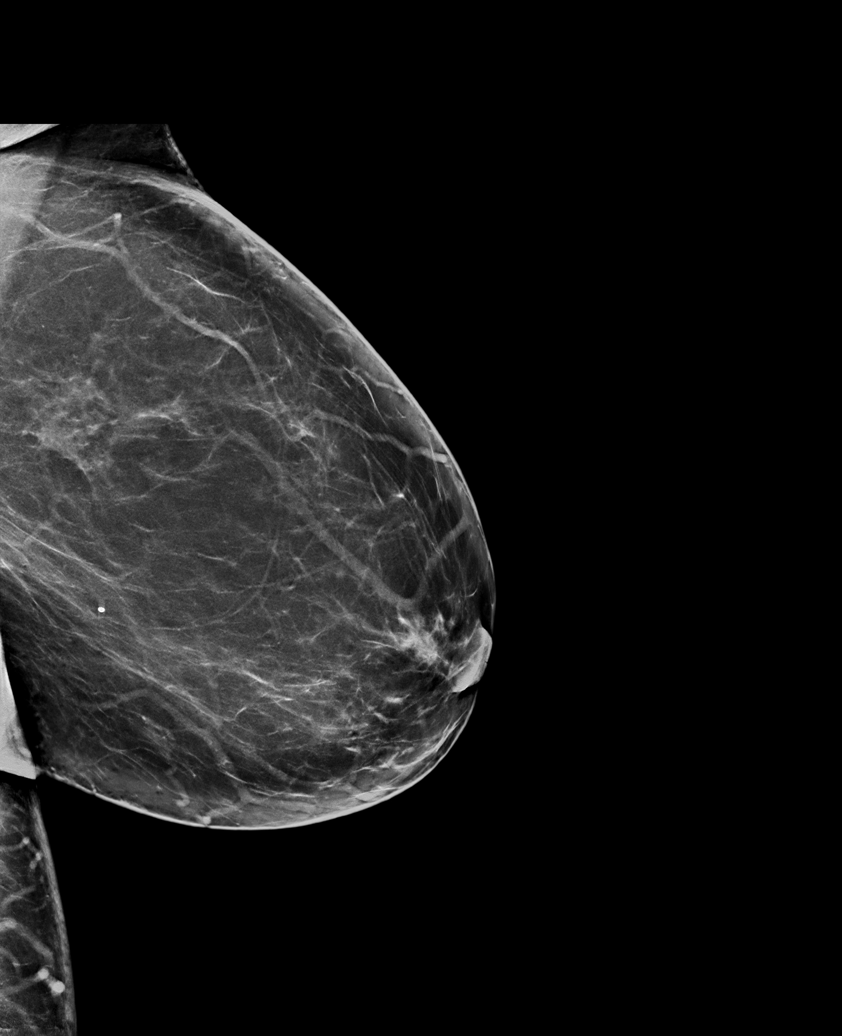

[L CC synth-2D]
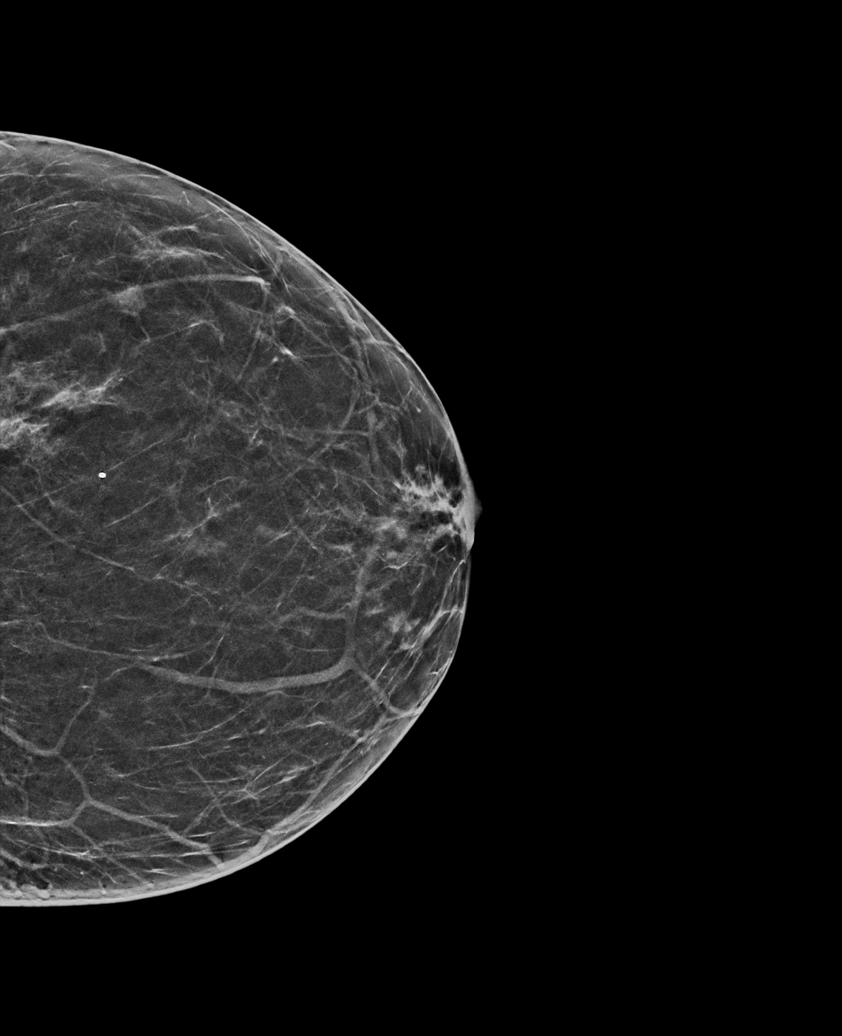

[L CC tomo · tomo slice 28/55.0]
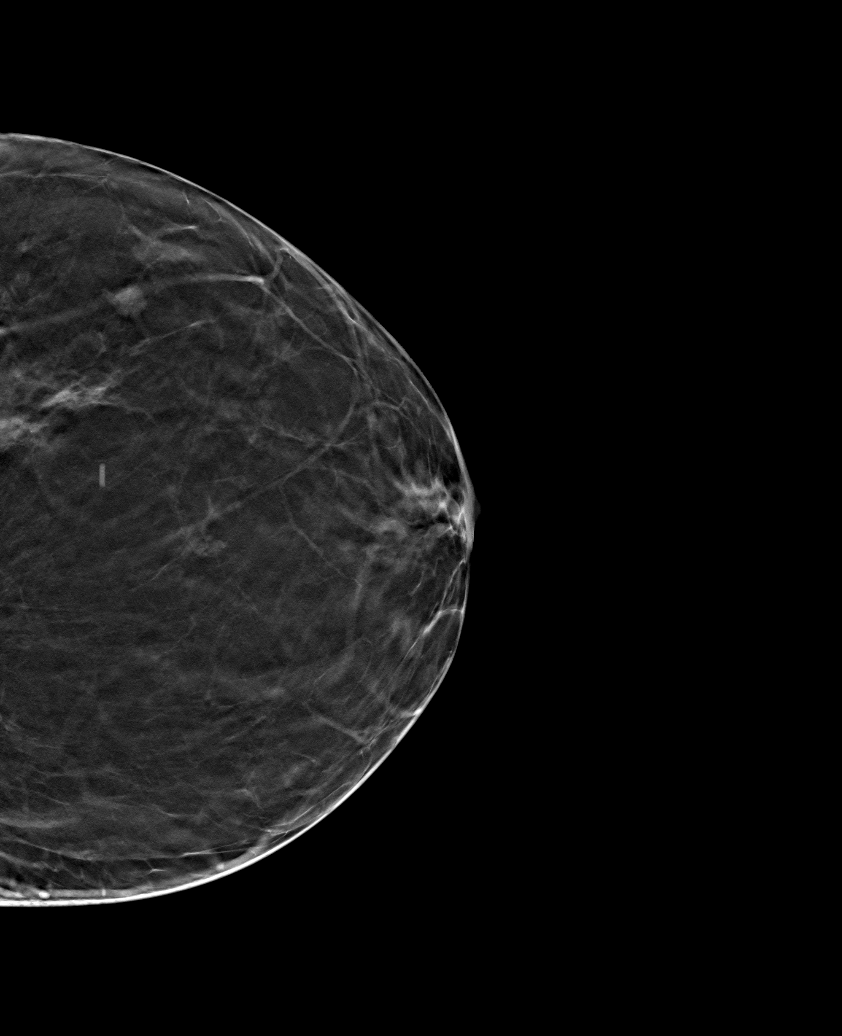

[6 of 30 positions shown; findings below may reference images not displayed]

ACR Breast Density Category b: There are scattered areas of
fibroglandular density.
FINDINGS: In the left breast, a possible mass and asymmetry warrant evaluation
in the right breast, no findings suspicious for malignancy.

Images were processed with CAD.
IMPRESSION: Further evaluation is suggested for possible mass and asymmetry in
the left breast.

RECOMMENDATION:
Diagnostic mammogram and possibly ultrasound of the left breast.
(Code:[UL])

The patient will be contacted regarding the findings, and additional
imaging will be scheduled.

BI-RADS CATEGORY  0: Incomplete. Need additional imaging evaluation
and/or prior mammograms for comparison.

ADDENDUM:
Prior exams from [DATE] and [DATE] have become available for
comparison. The possible asymmetry noted in the left breast is
stable from the prior exams. No further evaluation of this benign
finding is indicated. The possible mass noted in the left breast is
not clearly present on the prior exams. Additional follow-up is
still recommended.

Recommendation is still for diagnostic mammography and possible
ultrasound of the left breast. The BI-RADS category remains 0.

*** End of Addendum ***
ACR Breast Density Category b: There are scattered areas of
fibroglandular density.
FINDINGS: In the left breast, a possible mass and asymmetry warrant evaluation
in the right breast, no findings suspicious for malignancy.

Images were processed with CAD.
IMPRESSION: Further evaluation is suggested for possible mass and asymmetry in
the left breast.

RECOMMENDATION:
Diagnostic mammogram and possibly ultrasound of the left breast.
(Code:[UL])

The patient will be contacted regarding the findings, and additional
imaging will be scheduled.

BI-RADS CATEGORY  0: Incomplete. Need additional imaging evaluation
and/or prior mammograms for comparison.

## 2019-07-28 ENCOUNTER — Other Ambulatory Visit (HOSPITAL_COMMUNITY): Payer: Self-pay | Admitting: Physician Assistant

## 2019-07-28 DIAGNOSIS — I10 Essential (primary) hypertension: Secondary | ICD-10-CM | POA: Diagnosis not present

## 2019-07-28 DIAGNOSIS — R928 Other abnormal and inconclusive findings on diagnostic imaging of breast: Secondary | ICD-10-CM

## 2019-07-28 DIAGNOSIS — E782 Mixed hyperlipidemia: Secondary | ICD-10-CM | POA: Diagnosis not present

## 2019-07-31 DIAGNOSIS — L97512 Non-pressure chronic ulcer of other part of right foot with fat layer exposed: Secondary | ICD-10-CM | POA: Diagnosis not present

## 2019-07-31 DIAGNOSIS — E11621 Type 2 diabetes mellitus with foot ulcer: Secondary | ICD-10-CM | POA: Diagnosis not present

## 2019-08-11 ENCOUNTER — Encounter (INDEPENDENT_AMBULATORY_CARE_PROVIDER_SITE_OTHER): Payer: Self-pay | Admitting: Ophthalmology

## 2019-08-11 DIAGNOSIS — H3581 Retinal edema: Secondary | ICD-10-CM

## 2019-08-12 ENCOUNTER — Other Ambulatory Visit: Payer: Self-pay

## 2019-08-12 ENCOUNTER — Ambulatory Visit (HOSPITAL_COMMUNITY)
Admission: RE | Admit: 2019-08-12 | Discharge: 2019-08-12 | Disposition: A | Discharge status: Home / Self Care | Payer: Medicare HMO | Source: Ambulatory Visit | Attending: Physician Assistant | Admitting: Physician Assistant

## 2019-08-12 ENCOUNTER — Encounter: Payer: Self-pay | Admitting: Gastroenterology

## 2019-08-12 DIAGNOSIS — R928 Other abnormal and inconclusive findings on diagnostic imaging of breast: Secondary | ICD-10-CM | POA: Diagnosis not present

## 2019-08-12 DIAGNOSIS — N6321 Unspecified lump in the left breast, upper outer quadrant: Secondary | ICD-10-CM | POA: Diagnosis not present

## 2019-08-12 IMAGING — MG MM DIGITAL DIAGNOSTIC UNILAT*L* W/ TOMO W/ CAD
4 series · 4 of 12 positions shown · non-contrast
Comparison: Previous exam(s).

CLINICAL DATA: Screening recall for left breast mass.

EXAM:
DIGITAL DIAGNOSTIC UNILATERAL LEFT MAMMOGRAM WITH CAD AND TOMO
LEFT BREAST ULTRASOUND

[L MLO synth-2D]
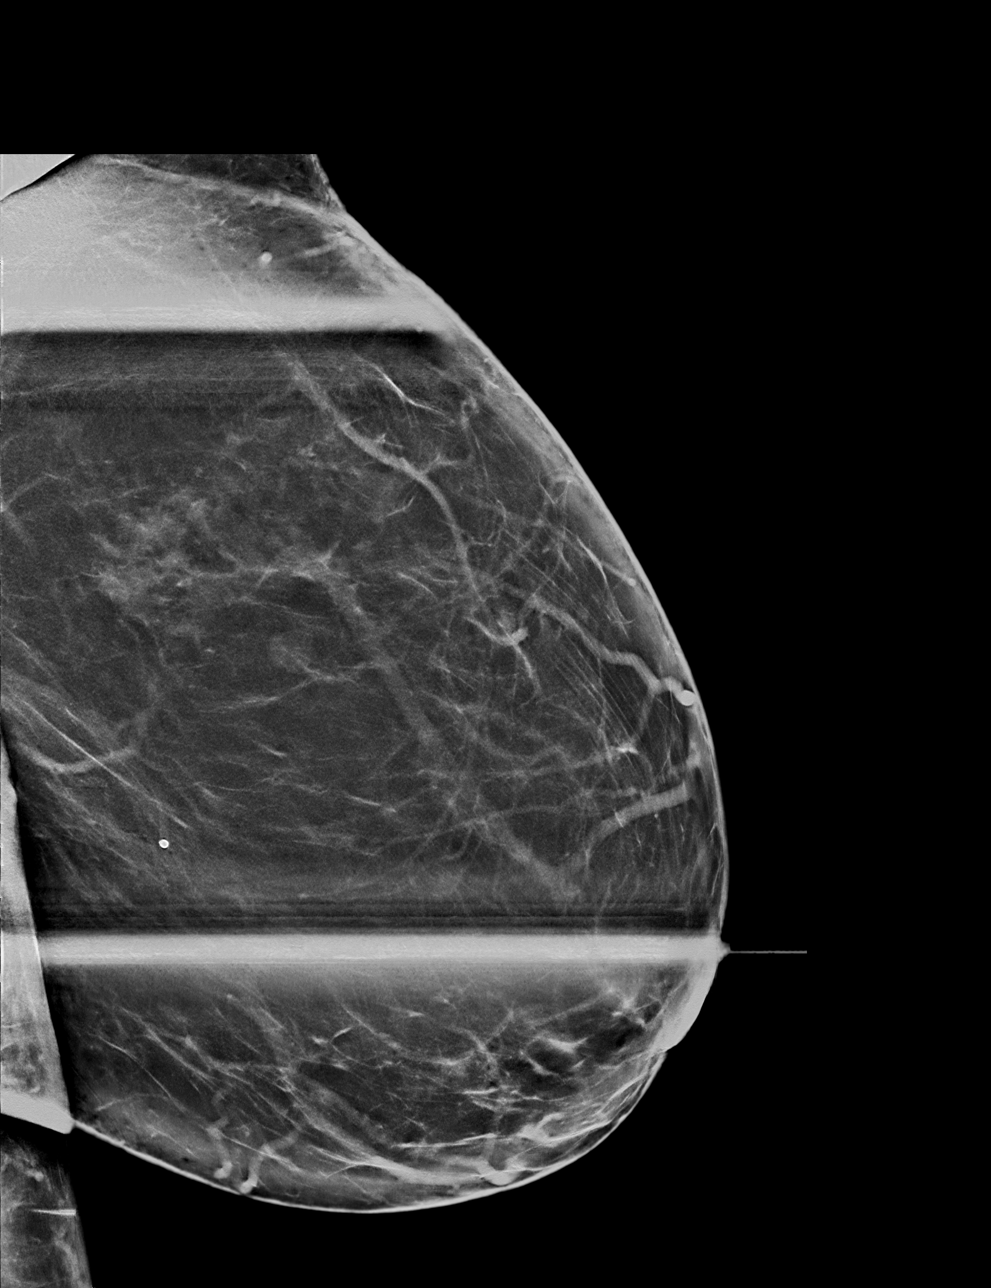

[L CC synth-2D]
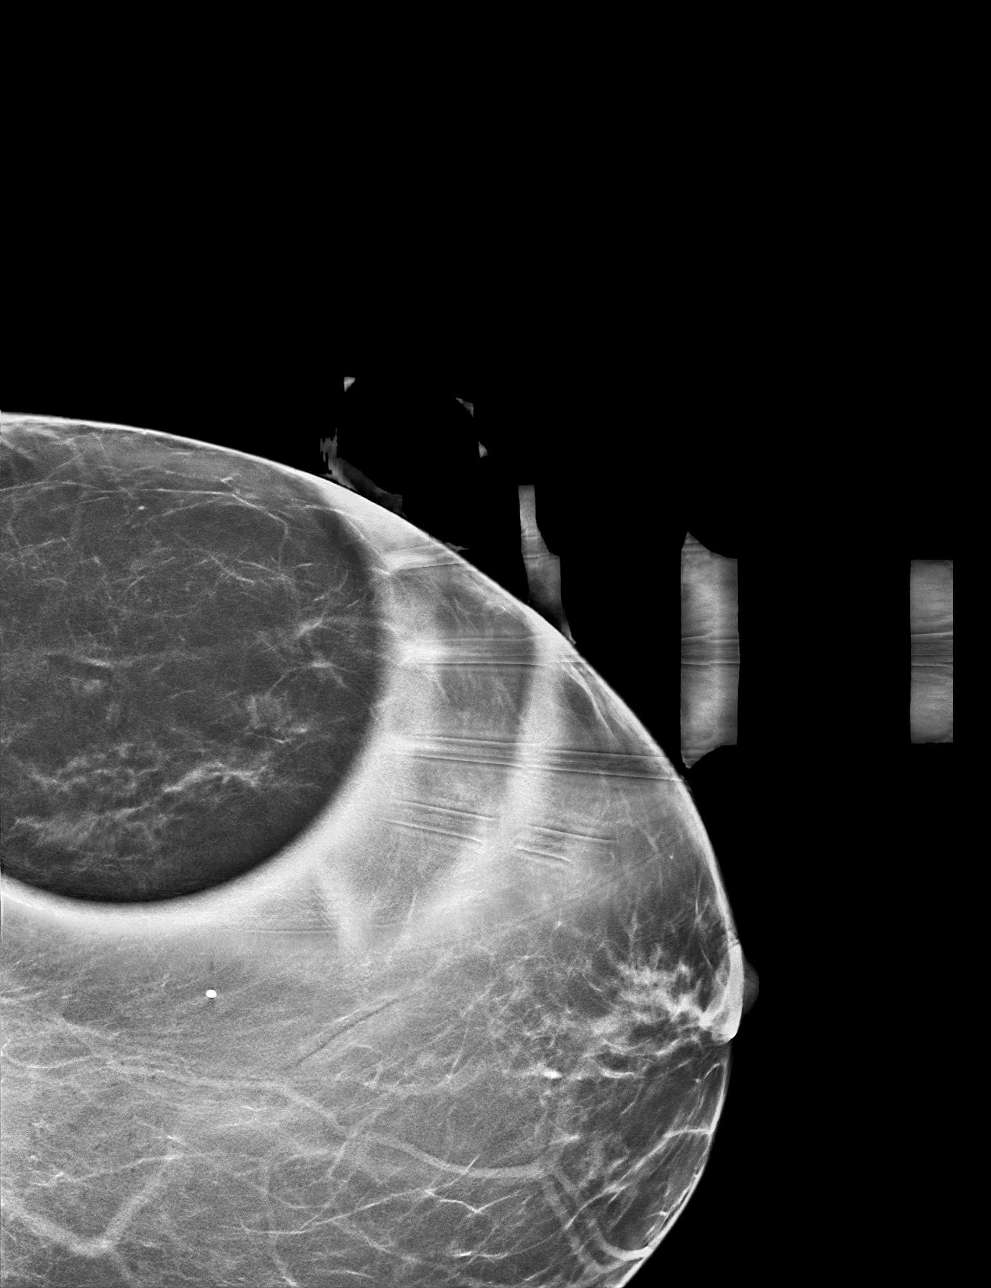

[L CC tomo · tomo slice 30/59.0]
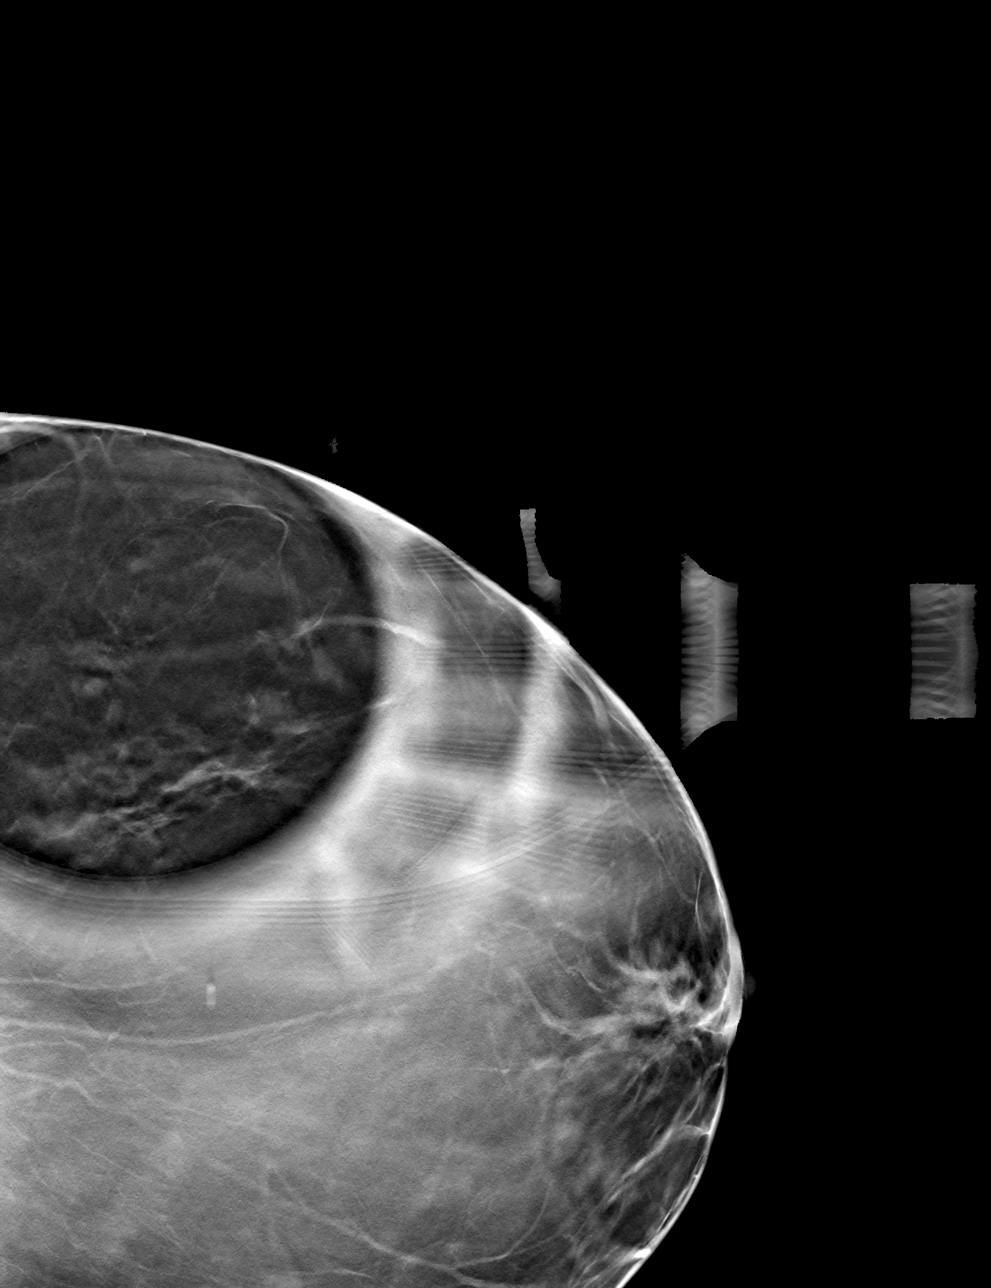

[L MLO tomo · tomo slice 42/83.0]
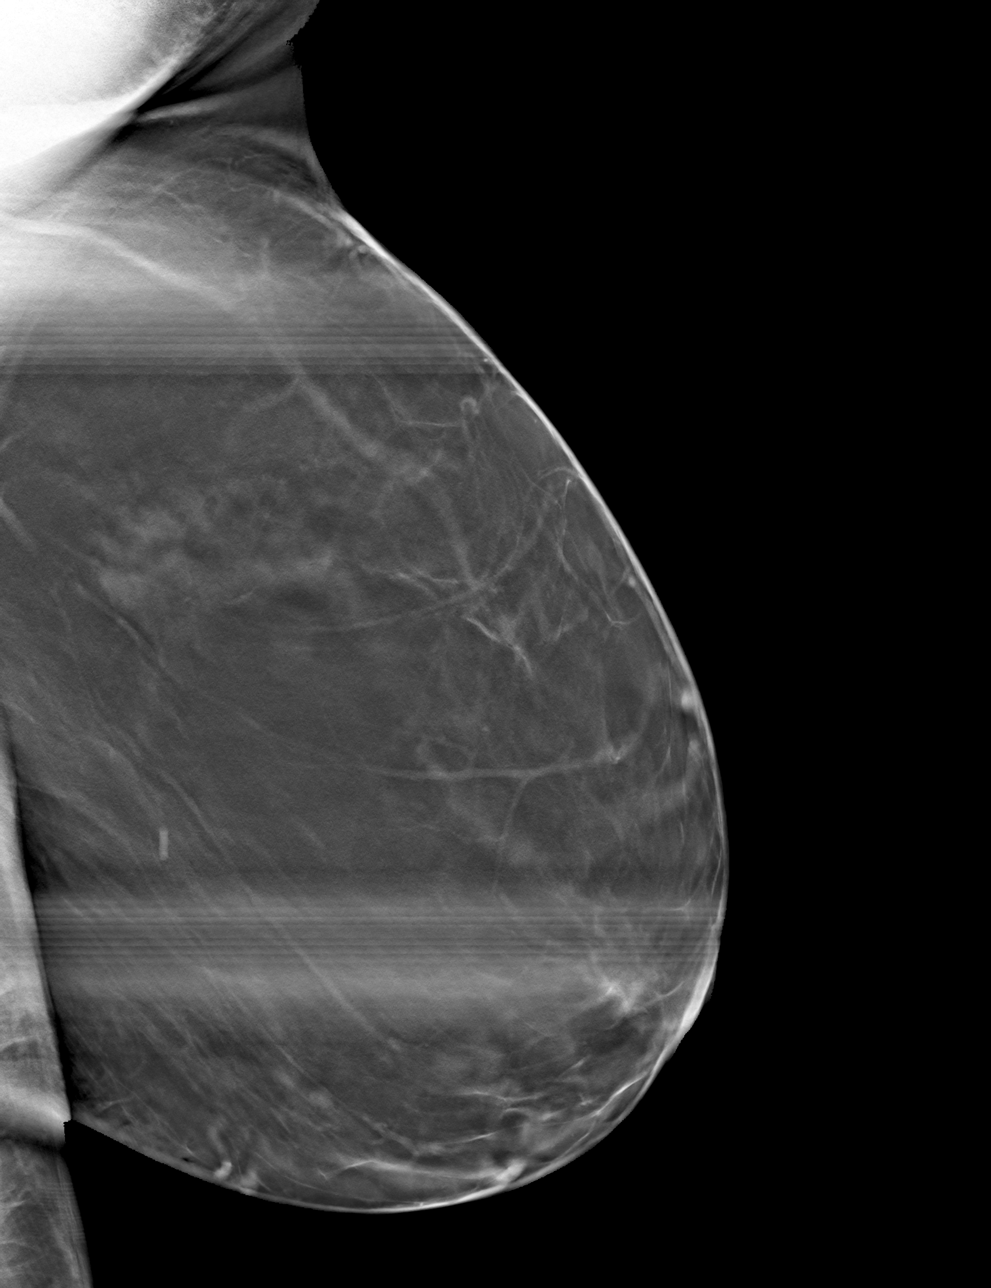

[4 of 12 positions shown; findings below may reference images not displayed]

ACR Breast Density Category b: There are scattered areas of
fibroglandular density.
FINDINGS: Spot compression tomograms were performed over the slightly
upper-outer left breast. There is an oval lobulated mass within the
upper-outer left breast measuring approximately 0.8 cm.

Mammographic images were processed with CAD.

Targeted ultrasound of the entire outer left breast was performed.
There is no definite sonographic correlate for the mass seen in the
outer left breast at mammography. No lymphadenopathy seen in the
left axilla.
IMPRESSION: Indeterminate mass in the slightly upper outer left breast seen on
mammography only.

RECOMMENDATION:
Stereotactic guided biopsy of the mass within the upper-outer left
breast is recommended.

I have discussed the findings and recommendations with the patient.
If applicable, a reminder letter will be sent to the patient
regarding the next appointment.

BI-RADS CATEGORY  4: Suspicious.

## 2019-08-12 IMAGING — US US BREAST*L* LIMITED INC AXILLA
1 series · 2 of 2 positions shown · non-contrast
Comparison: Previous exam(s).

CLINICAL DATA: Screening recall for left breast mass.

EXAM:
DIGITAL DIAGNOSTIC UNILATERAL LEFT MAMMOGRAM WITH CAD AND TOMO
LEFT BREAST ULTRASOUND

[Series 1: us breast*left* limited inc axilla · 0.08mm/px · 2 of 2 slices shown]
[im 1/2]
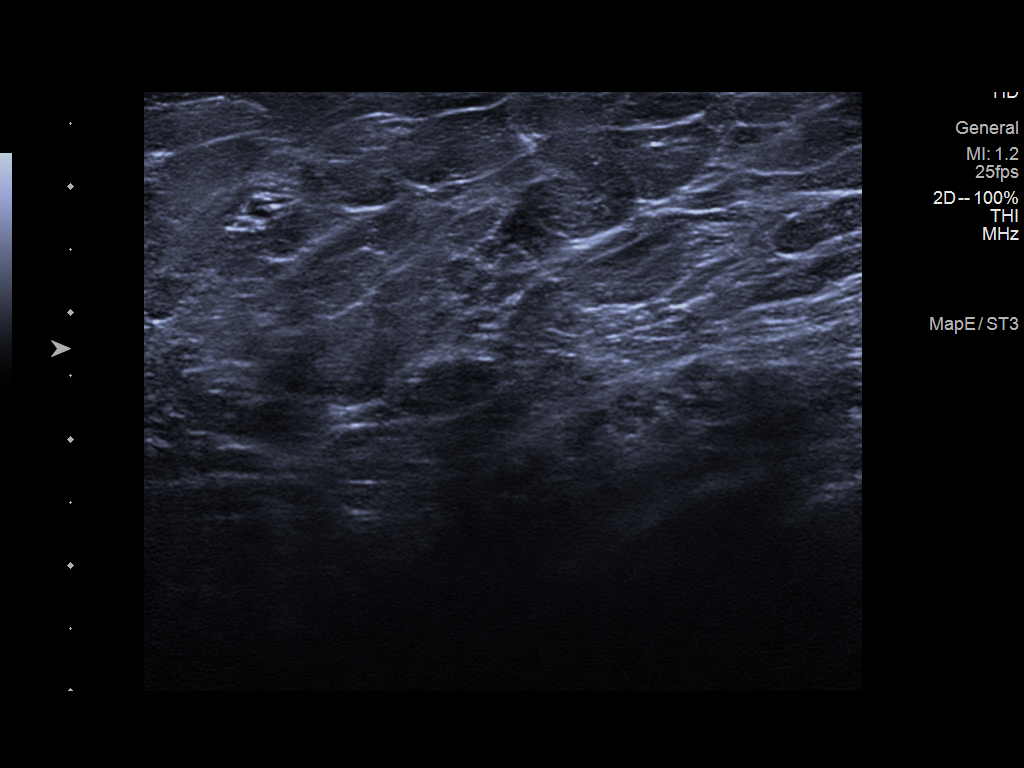
[im 2/2]
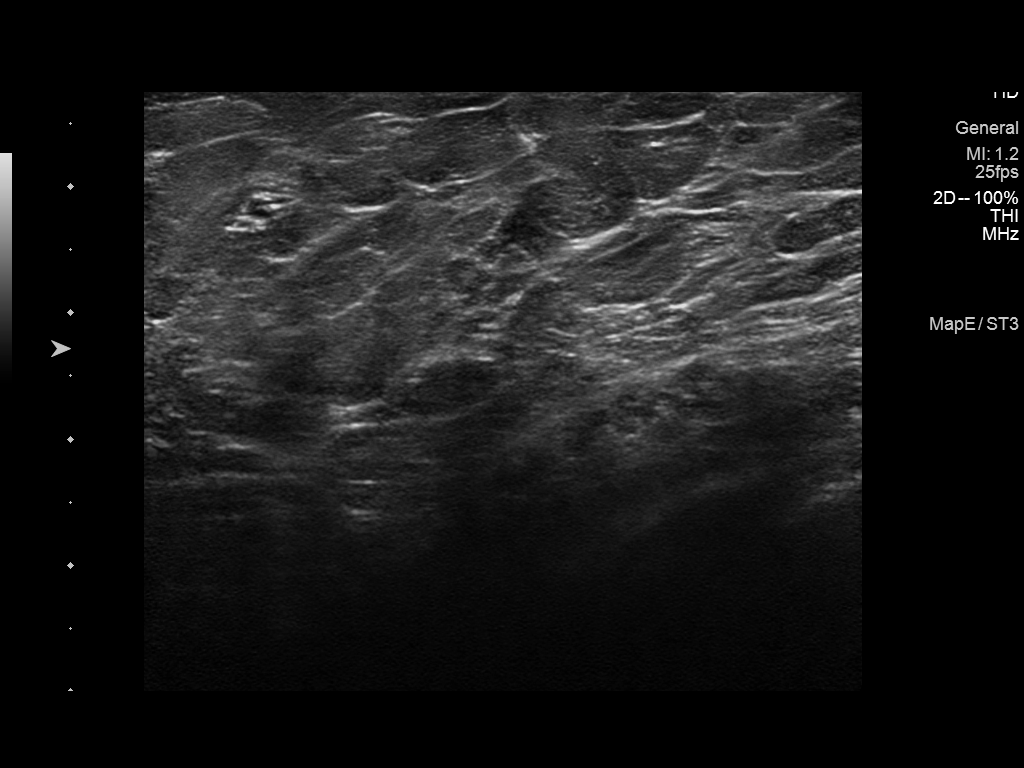

[2 of 2 positions shown; findings below may reference images not displayed]

ACR Breast Density Category b: There are scattered areas of
fibroglandular density.
FINDINGS: Spot compression tomograms were performed over the slightly
upper-outer left breast. There is an oval lobulated mass within the
upper-outer left breast measuring approximately 0.8 cm.

Mammographic images were processed with CAD.

Targeted ultrasound of the entire outer left breast was performed.
There is no definite sonographic correlate for the mass seen in the
outer left breast at mammography. No lymphadenopathy seen in the
left axilla.
IMPRESSION: Indeterminate mass in the slightly upper outer left breast seen on
mammography only.

RECOMMENDATION:
Stereotactic guided biopsy of the mass within the upper-outer left
breast is recommended.

I have discussed the findings and recommendations with the patient.
If applicable, a reminder letter will be sent to the patient
regarding the next appointment.

BI-RADS CATEGORY  4: Suspicious.

## 2019-08-14 NOTE — Progress Notes (Addendum)
[facility] Note  08/18/2019     CHIEF COMPLAINT Patient presents for Retina Evaluation and Diabetic Eye Exam   HISTORY OF PRESENT ILLNESS: Nicole Golden is a 58 y.o. female who presents to the clinic today for:   HPI    Retina Evaluation    In both eyes.  This started 1 year ago.  Associated Symptoms Flashes.  Negative for Pain, Trauma, Fever, Weight Loss, Scalp Tenderness, Redness, Floaters, Distortion, Photophobia, Jaw Claudication, Fatigue, Shoulder/Hip pain, Glare and Blind Spot.  Context:  distance vision, mid-range vision and near vision.  Treatments tried include surgery.  Response to treatment was significant improvement.  I, the attending physician,  performed the HPI with the patient and updated documentation appropriately.          Diabetic Eye Exam    Vision fluctuates with blood sugars.  Associated Symptoms Negative for Blind Spot, Glare, Shoulder/Hip pain, Flashes, Pain, Trauma, Fever, Weight Loss, Scalp Tenderness, Redness, Floaters, Distortion, Photophobia, Jaw Claudication and Fatigue.  Diabetes characteristics include Type 2.  This started 36 years ago.  Blood sugar level fluctuates.  Last Blood Glucose 170.  Last A1C 11.  I, the attending physician,  performed the HPI with the patient and updated documentation appropriately.          Comments    Referral of [facility] for DME. Patient states she is DM2 x 36 yrs, BS fluctuate, is in taking Jardiance and insulin.Patient denies visual issues .        Last edited by Bernarda Caffey, MD on 08/18/2019  1:47 PM. (History)    pt states she was referred here by her PCP, she states they took some pictures of the back of her eyes, but did not say they found anything abnormal, she states they did not dilate her eyes, she states she has an eye dr in [place] where she gets glasses, she had cataract sx in [place], pt was not having any vision complaints prior to being referred here, pt is diabetic, she  was dx 36 years ago and is on insulin  Referring physician: Practice, [facility] [place],  [place] [address]  HISTORICAL INFORMATION:   Selected notes from the MEDICAL RECORD NUMBER Referred by [facility] for DM eye exam   CURRENT MEDICATIONS: No current outpatient medications on file. (Ophthalmic Drugs)   No current facility-administered medications for this visit. (Ophthalmic Drugs)   Current Outpatient Medications (Other)  Medication Sig  . acetaminophen (TYLENOL) 500 MG tablet Take 500 mg by mouth every 6 (six) hours as needed for fever.  . hydrochlorothiazide (HYDRODIURIL) 25 MG tablet Take 25 mg by mouth daily.  . Insulin Aspart (NOVOLOG FLEXPEN [place]) Inject 10-16 Units into the skin 3 (three) times daily.  . insulin aspart protamine- aspart (NOVOLOG MIX 70/30) (70-30) 100 UNIT/ML injection Inject 10-16 Units into the skin. Each meal  . insulin glargine (LANTUS) 100 UNIT/ML injection Inject 43 Units into the skin 2 (two) times daily.   [place] Kitchen JARDIANCE 25 MG TABS tablet   . linaclotide (LINZESS) 290 MCG CAPS capsule Take 1 capsule (290 mcg total) by mouth daily before breakfast. (Patient taking differently: Take 290 mcg by mouth as needed. )  . lovastatin (MEVACOR) 20 MG tablet Take 20 mg by mouth at bedtime.   No current facility-administered medications for this visit. (Other)      REVIEW OF SYSTEMS: ROS    Positive for: Endocrine, Eyes   Negative for:  Constitutional, Gastrointestinal, Neurological, Skin, Genitourinary, Musculoskeletal, HENT, Cardiovascular, Respiratory, Psychiatric, Allergic/Imm, Heme/Lymph   Last edited by Zenovia Jordan, LPN on 624THL  D34-534 PM. (History)       ALLERGIES Allergies  Allergen Reactions  . Ciprofloxacin Hives    PAST MEDICAL HISTORY Past Medical History:  Diagnosis Date  . Diabetes mellitus without complication ([place])   . Diabetic neuropathy ([place])   . Diabetic neuropathy ([place])   . Hypertension    Past Surgical History:   Procedure Laterality Date  . CATARACT EXTRACTION    . EYE SURGERY    . FOOT SURGERY     right foot  . TUBAL LIGATION      FAMILY HISTORY Family History  Problem Relation Age of Onset  . Breast cancer Paternal Aunt   . Colon cancer Neg Hx   . Colon polyps Neg Hx     SOCIAL HISTORY Social History   Tobacco Use  . Smoking status: Former Research scientist (life sciences)  . Smokeless tobacco: Never Used  Substance Use Topics  . Alcohol use: Yes    Comment: occas  . Drug use: Never         OPHTHALMIC EXAM:  Base Eye Exam    Visual Acuity (Snellen - Linear)      Right Left   Dist [place] 20/25 +1 20/60 -1   Dist ph  NI 20/40       Tonometry (Tonopen, 1:24 PM)      Right Left   Pressure 12 16       Pupils      Dark Light Shape React APD   Right 3 2 Round Brisk None   Left 3 2 Round Brisk None       Visual Fields (Counting fingers)      Left Right    Full Full       Extraocular Movement      Right Left    Full, Ortho Full, Ortho       Neuro/Psych    Oriented x3: Yes       Dilation    Both eyes: 1.0% Mydriacyl, 2.5% Phenylephrine @ 1:10 PM        Slit Lamp and Fundus Exam    Slit Lamp Exam      Right Left   Lids/Lashes Dermatochalasis - upper lid Dermatochalasis - upper lid   Conjunctiva/Sclera Mild Melanosis Mild Melanosis   Cornea Arcus, well healed temporal cataract wounds Arcus, well healed temporal cataract wounds   Anterior Chamber Deep and quiet Deep and quiet   Iris Round and dilated, No NVI Round and dilated, No NVI   Lens PC IOL in good position PC IOL in good position, ?tilt   Vitreous Mild Vitreous syneresis Mild Vitreous syneresis       Fundus Exam      Right Left   Disc Pallor, Sharp rim, +SVP, temporal PPP/PPA Pink and Sharp, mild PPA   C/D Ratio 0.2 0.2   Macula Flat, Blunted foveal reflex, Retinal pigment epithelial mottling Flat, Blunted foveal reflex, Retinal pigment epithelial mottling   Vessels Vascular attenuation, Tortuous, Copper wiring  Vascular attenuation, Tortuous, Copper wiring   Periphery Attached, mild reticular degeneration   Attached, rare peripheral drusen         Refraction    Manifest Refraction      Sphere Cylinder Axis Dist VA   Right -0.75 +0.50 090 20/25   Left -2.25 +0.50 150 20/40          IMAGING AND  PROCEDURES  Imaging and Procedures for @TODAY @  OCT, Retina - OU - Both Eyes       Right Eye Quality was good. Central Foveal Thickness: 270. Progression has no prior data. Findings include normal foveal contour, no IRF, no SRF, vitreomacular adhesion .   Left Eye Quality was good. Central Foveal Thickness: 262. Progression has no prior data. Findings include normal foveal contour, no IRF, no SRF, vitreomacular adhesion .   Notes *Images captured and stored on drive  Diagnosis / Impression:  NFP, no IRF/SRF OU No DME OU  Clinical management:  See below  Abbreviations: NFP - Normal foveal profile. CME - cystoid macular edema. PED - pigment epithelial detachment. IRF - intraretinal fluid. SRF - subretinal fluid. EZ - ellipsoid zone. ERM - epiretinal membrane. ORA - outer retinal atrophy. ORT - outer retinal tubulation. SRHM - subretinal hyper-reflective material                 ASSESSMENT/PLAN:    ICD-10-CM   1. Diabetes mellitus type 2 without retinopathy ([place])  E11.9   2. Retinal edema  H35.81 OCT, Retina - OU - Both Eyes  3. Essential hypertension  I10   4. Hypertensive retinopathy of both eyes  H35.033   5. Pseudophakia of both eyes  Z96.1     1,2. Diabetes mellitus, type 2 without retinopathy  - The incidence, risk factors for progression, natural history and treatment options for diabetic retinopathy  were discussed with patient.    - The need for close monitoring of blood glucose, blood pressure, and serum lipids, avoiding cigarette or any type of tobacco, and the need for long term follow up was also discussed with patient.  - f/u in 1 year, sooner prn -- okay to f/u  with optometrist or general ophthalmologist  3,4. Hypertensive retinopathy OU  - discussed importance of tight BP control  - monitor  5. Pseudophakia OU  - s/p CE/IOL both eyes [facility])  - doing well  - ? Mild tilt of PCIOL OS  - monitor   Ophthalmic Meds Ordered this visit:  No orders of the defined types were placed in this encounter.      Return if symptoms worsen or fail to improve.  There are no Patient Instructions on file for this visit.   Explained the diagnoses, plan, and follow up with the patient and they expressed understanding.  Patient expressed understanding of the importance of proper follow up care.   This document serves as a record of services personally performed by Gardiner Sleeper, MD, PhD. It was created on their behalf by Leeann Must, [place], a certified ophthalmic assistant. The creation of this record is the provider's dictation and/or activities during the visit.    Electronically signed by: Leeann Must, COA @TODAY @ 9:13 PM   This document serves as a record of services personally performed by Gardiner Sleeper, MD, PhD. It was created on their behalf by Ernest Mallick, OA, an ophthalmic assistant. The creation of this record is the provider's dictation and/or activities during the visit.    Electronically signed by: Ernest Mallick, OA 12.21.2020 9:13 PM   Gardiner Sleeper, M.D., Ph.D. Diseases & Surgery of the Retina and Vitreous [facility]  I have reviewed the above documentation for accuracy and completeness, and I agree with the above. Gardiner Sleeper, M.D., Ph.D. 08/18/19 9:13 PM   Abbreviations: M myopia (nearsighted); A astigmatism; H hyperopia (farsighted); P presbyopia; Mrx spectacle prescription;  CTL contact lenses; OD right eye; OS left eye; OU both eyes  XT exotropia; ET esotropia; PEK punctate epithelial keratitis; PEE punctate epithelial erosions; DES dry eye syndrome; MGD meibomian gland dysfunction; ATs  artificial tears; PFAT's preservative free artificial tears; [place] nuclear sclerotic cataract; PSC posterior subcapsular cataract; ERM epi-retinal membrane; PVD posterior vitreous detachment; RD retinal detachment; DM diabetes mellitus; DR diabetic retinopathy; NPDR non-proliferative diabetic retinopathy; PDR proliferative diabetic retinopathy; CSME clinically significant macular edema; DME diabetic macular edema; dbh dot blot hemorrhages; CWS cotton wool spot; POAG primary open angle glaucoma; C/D cup-to-disc ratio; HVF humphrey visual field; GVF goldmann visual field; OCT optical coherence tomography; IOP intraocular pressure; BRVO Branch retinal vein occlusion; CRVO central retinal vein occlusion; CRAO central retinal artery occlusion; BRAO branch retinal artery occlusion; RT retinal tear; SB scleral buckle; PPV pars plana vitrectomy; VH Vitreous hemorrhage; PRP panretinal laser photocoagulation; IVK intravitreal kenalog; VMT vitreomacular traction; MH Macular hole;  NVD neovascularization of the disc; NVE neovascularization elsewhere; AREDS age related eye disease study; ARMD age related macular degeneration; POAG primary open angle glaucoma; EBMD epithelial/anterior basement membrane dystrophy; ACIOL anterior chamber intraocular lens; IOL intraocular lens; PCIOL posterior chamber intraocular lens; Phaco/IOL phacoemulsification with intraocular lens placement; [place] photorefractive keratectomy; LASIK laser assisted in situ keratomileusis; HTN hypertension; DM diabetes mellitus; COPD chronic obstructive pulmonary disease

## 2019-08-18 ENCOUNTER — Encounter (INDEPENDENT_AMBULATORY_CARE_PROVIDER_SITE_OTHER): Payer: Self-pay | Admitting: Ophthalmology

## 2019-08-18 ENCOUNTER — Ambulatory Visit (INDEPENDENT_AMBULATORY_CARE_PROVIDER_SITE_OTHER): Payer: Medicare HMO | Admitting: Ophthalmology

## 2019-08-18 ENCOUNTER — Other Ambulatory Visit: Payer: Self-pay | Admitting: Physician Assistant

## 2019-08-18 DIAGNOSIS — H3581 Retinal edema: Secondary | ICD-10-CM

## 2019-08-18 DIAGNOSIS — Z961 Presence of intraocular lens: Secondary | ICD-10-CM | POA: Diagnosis not present

## 2019-08-18 DIAGNOSIS — I10 Essential (primary) hypertension: Secondary | ICD-10-CM

## 2019-08-18 DIAGNOSIS — H35033 Hypertensive retinopathy, bilateral: Secondary | ICD-10-CM | POA: Diagnosis not present

## 2019-08-18 DIAGNOSIS — N6320 Unspecified lump in the left breast, unspecified quadrant: Secondary | ICD-10-CM

## 2019-08-18 DIAGNOSIS — E119 Type 2 diabetes mellitus without complications: Secondary | ICD-10-CM

## 2019-08-20 DIAGNOSIS — M25572 Pain in left ankle and joints of left foot: Secondary | ICD-10-CM | POA: Diagnosis not present

## 2019-08-20 DIAGNOSIS — L600 Ingrowing nail: Secondary | ICD-10-CM | POA: Diagnosis not present

## 2019-08-26 ENCOUNTER — Ambulatory Visit
Admission: RE | Admit: 2019-08-26 | Discharge: 2019-08-26 | Disposition: A | Discharge status: Home / Self Care | Payer: Medicare HMO | Source: Ambulatory Visit | Attending: Physician Assistant | Admitting: Physician Assistant

## 2019-08-26 ENCOUNTER — Other Ambulatory Visit: Payer: Self-pay

## 2019-08-26 DIAGNOSIS — N6323 Unspecified lump in the left breast, lower outer quadrant: Secondary | ICD-10-CM | POA: Diagnosis not present

## 2019-08-26 DIAGNOSIS — N6320 Unspecified lump in the left breast, unspecified quadrant: Secondary | ICD-10-CM

## 2019-08-26 DIAGNOSIS — N6012 Diffuse cystic mastopathy of left breast: Secondary | ICD-10-CM | POA: Diagnosis not present

## 2019-08-26 IMAGING — MG MM BREAST BX W LOC DEV 1ST LESION IMAGE BX SPEC STEREO GUIDE*L*
8 of 11 series · 8 of 27 positions shown · non-contrast
Comparison: Previous exams.
COMPARISON: Previous exams.

Addendum:
CLINICAL DATA: Patient with indeterminate mass lower outer left
breast.

EXAM:
LEFT BREAST STEREOTACTIC CORE NEEDLE BIOPSY

[L (1 of 6)]
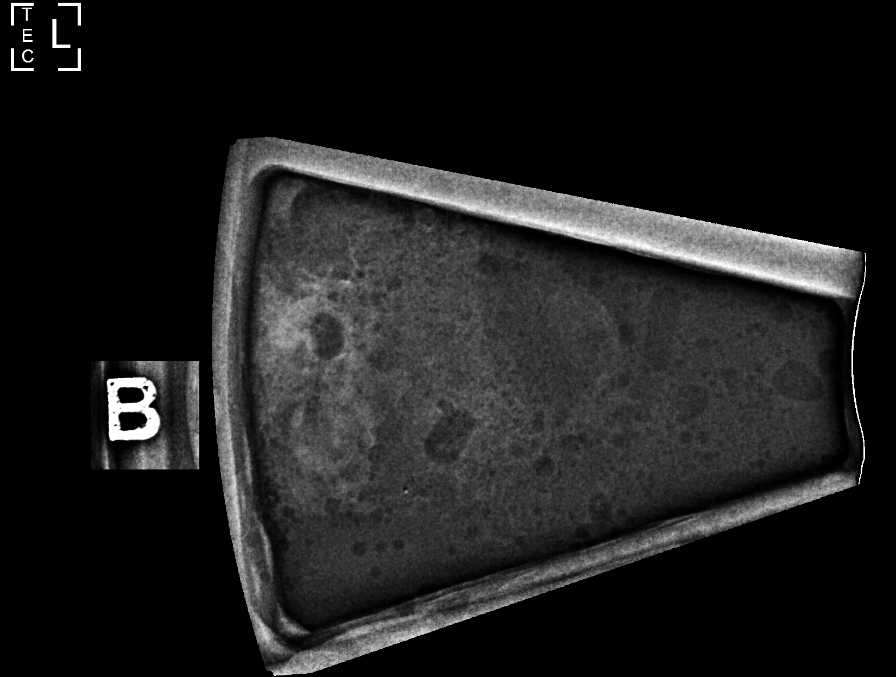

[L (2 of 6)]
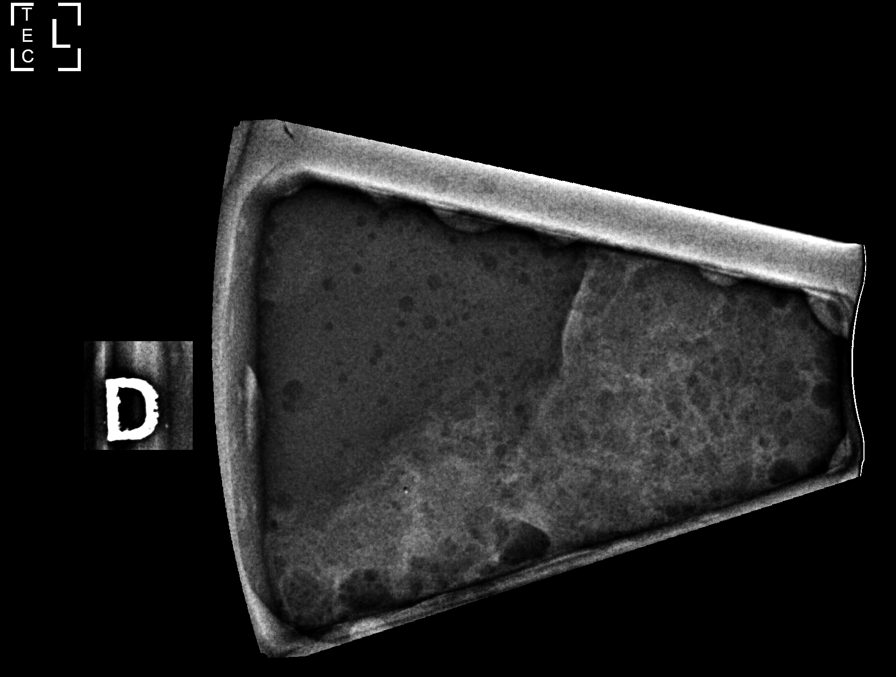

[L (3 of 6)]
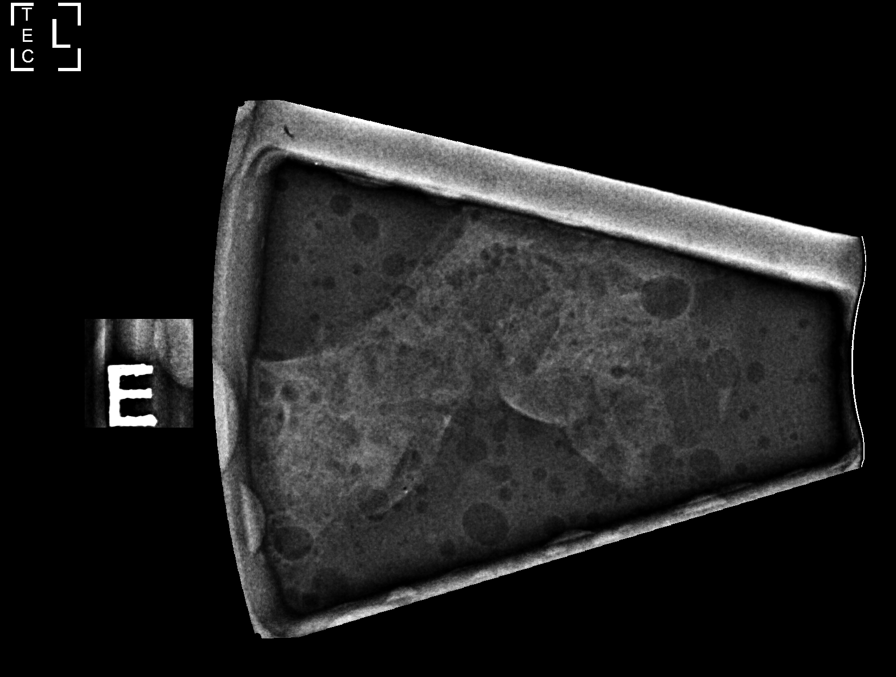

[L (4 of 6)]
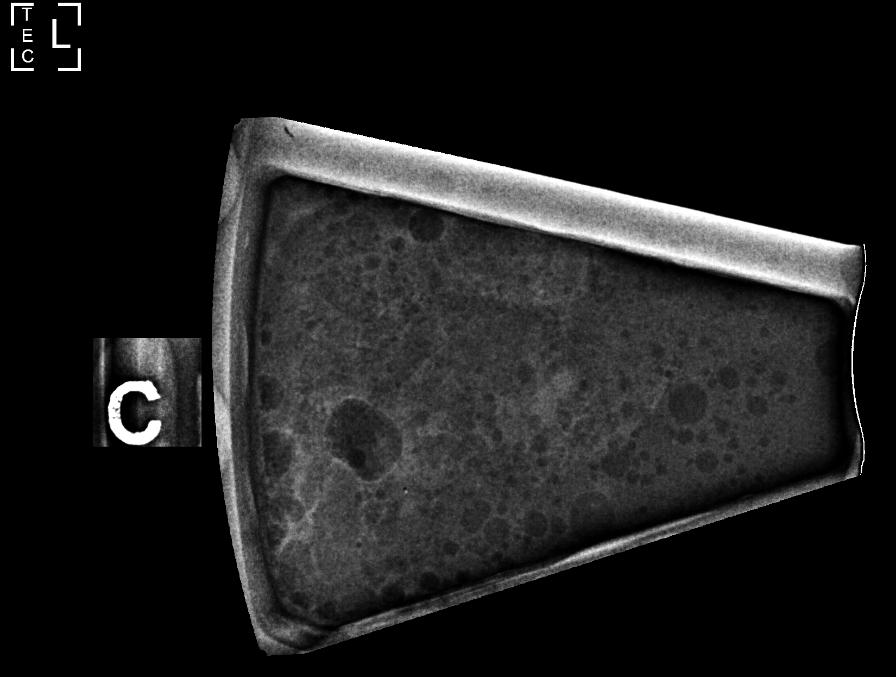

[L (5 of 6)]
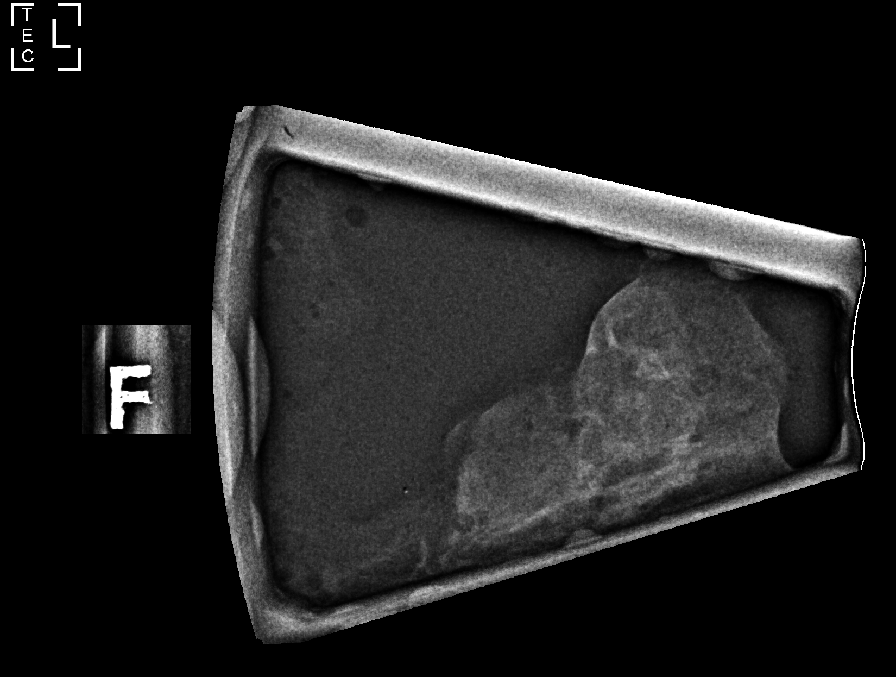

[L (6 of 6)]
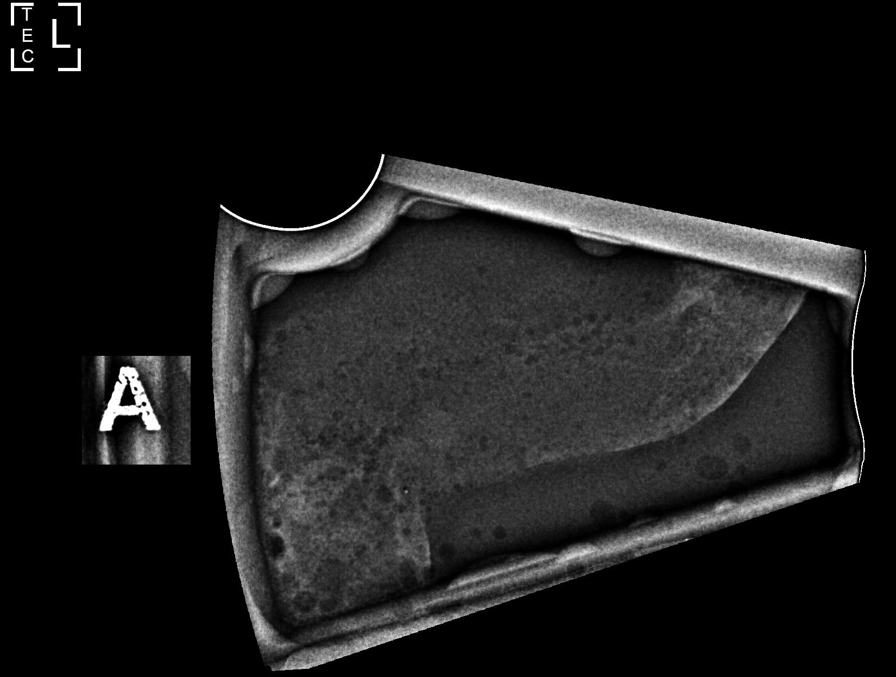

[L LM]
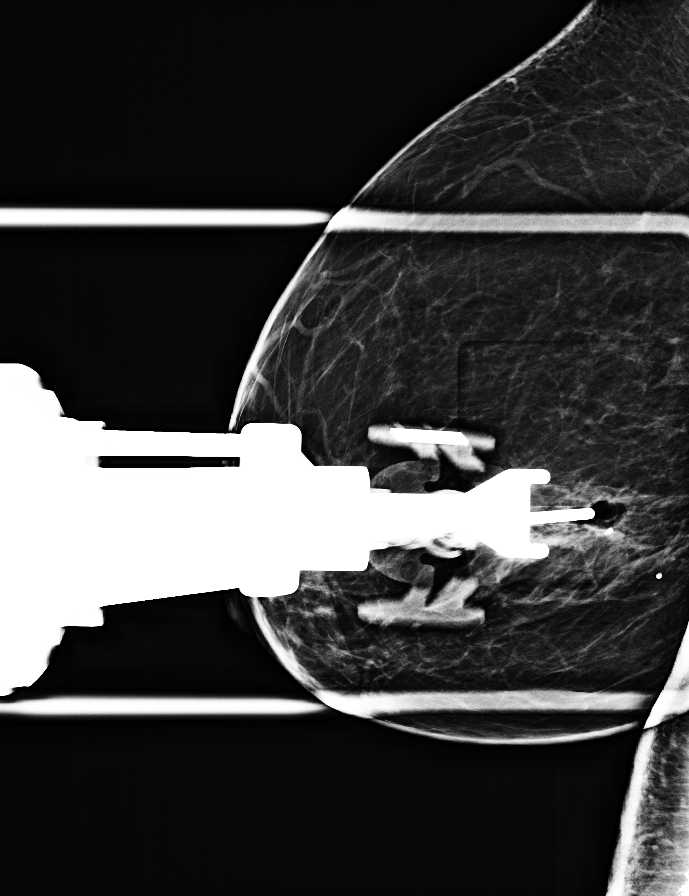

[L LM tomo · tomo slice 38/75.0]
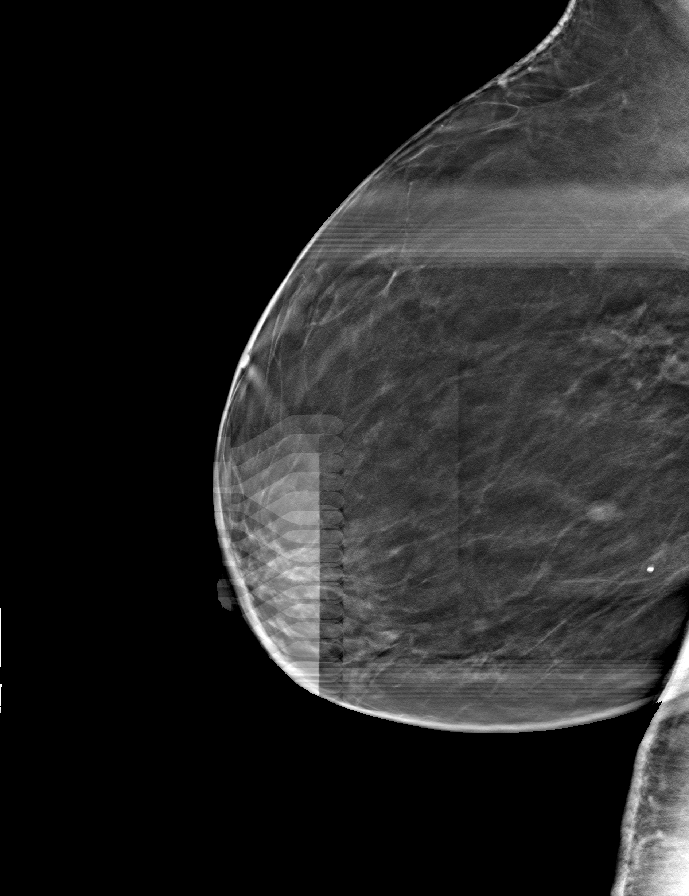

[8 of 27 positions shown; findings below may reference images not displayed]



Using sterile technique and 1% Lidocaine as local anesthetic, under
stereotactic guidance, a 9 gauge vacuum assisted device was used to
perform core needle biopsy of mass within the lower outer left
breast using a lateral approach.

Lesion quadrant: Lower outer quadrant

At the conclusion of the procedure, coil shaped tissue marker clip
was deployed into the biopsy cavity. Follow-up 2-view mammogram was
performed and dictated separately.
IMPRESSION: Stereotactic-guided biopsy of left breast mass. No apparent
complications.

ADDENDUM:
Pathology revealed FIBROCYSTIC CHANGE WITH APOCRINE METAPLASIA of
the Left breast, lower outer. This was found to be concordant by Dr.
MORATAYA.

Pathology results were discussed with the patient by telephone. The
patient reported doing well after the biopsy without significant
tenderness and bleeding at the site. Post biopsy instructions and
care were reviewed and questions were answered. The patient was
encouraged to call The [REDACTED] for any
additional concerns.

The patient was instructed to return for annual screening
mammography at [HOSPITAL] in [HOSPITAL][HOSPITAL].

Pathology results reported by MORATAYA, RN on [DATE].



Using sterile technique and 1% Lidocaine as local anesthetic, under
stereotactic guidance, a 9 gauge vacuum assisted device was used to
perform core needle biopsy of mass within the lower outer left
breast using a lateral approach.

Lesion quadrant: Lower outer quadrant

At the conclusion of the procedure, coil shaped tissue marker clip
was deployed into the biopsy cavity. Follow-up 2-view mammogram was
performed and dictated separately.
IMPRESSION: Stereotactic-guided biopsy of left breast mass. No apparent
complications.

## 2019-08-26 IMAGING — MG MM BREAST LOCALIZATION CLIP
4 series · 4 of 12 positions shown · non-contrast
Comparison: Previous exam(s).

CLINICAL DATA: Indeterminate left breast mass status post
stereotactic guided biopsy.

EXAM:
DIAGNOSTIC LEFT MAMMOGRAM POST STEREOTACTIC BIOPSY

[L LM synth-2D]
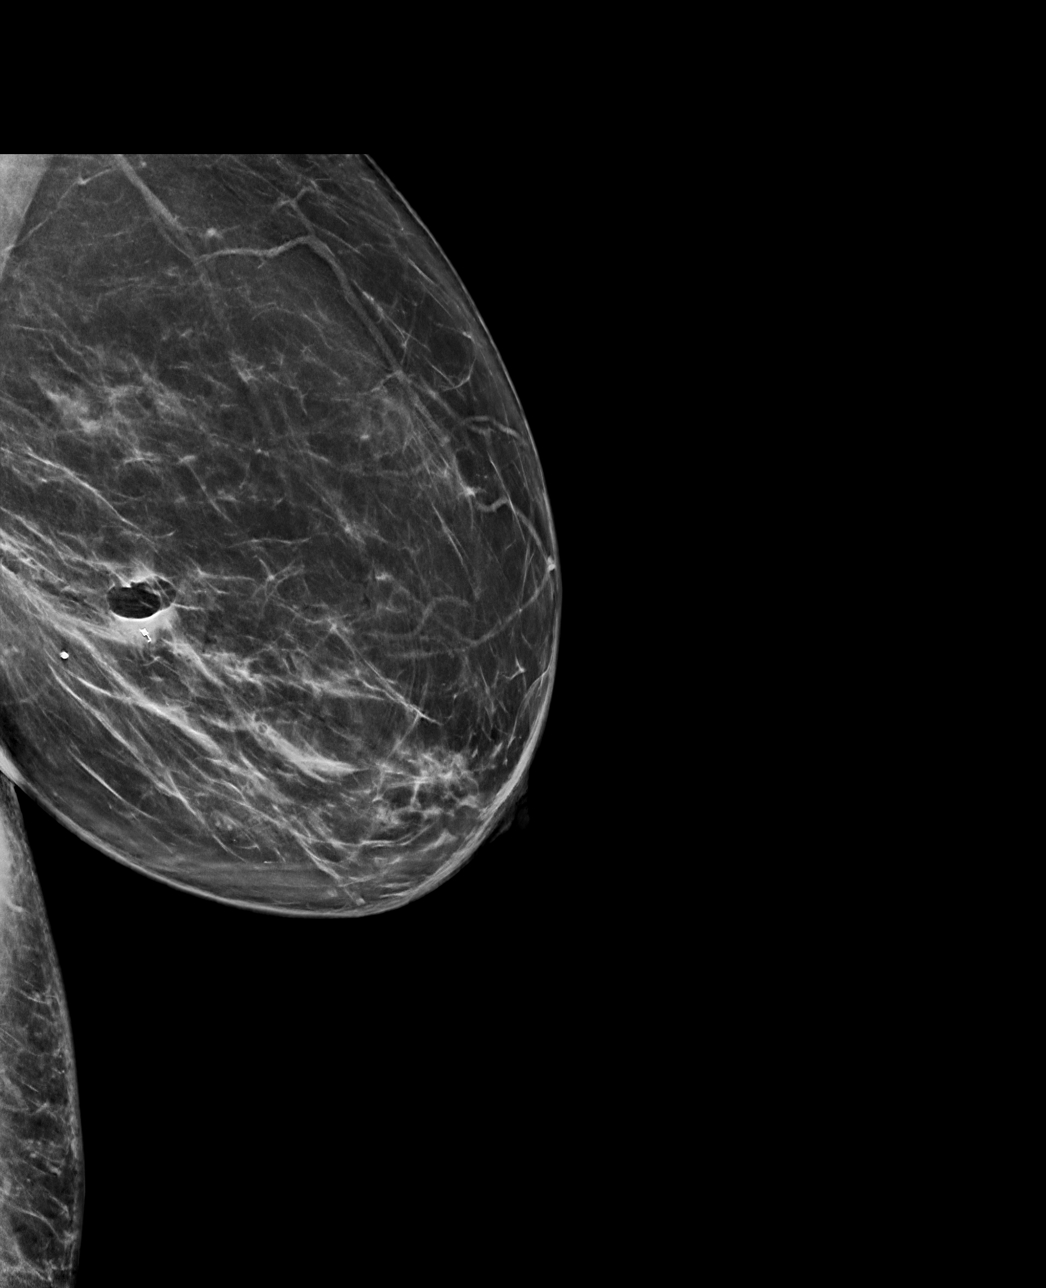

[L CC synth-2D]
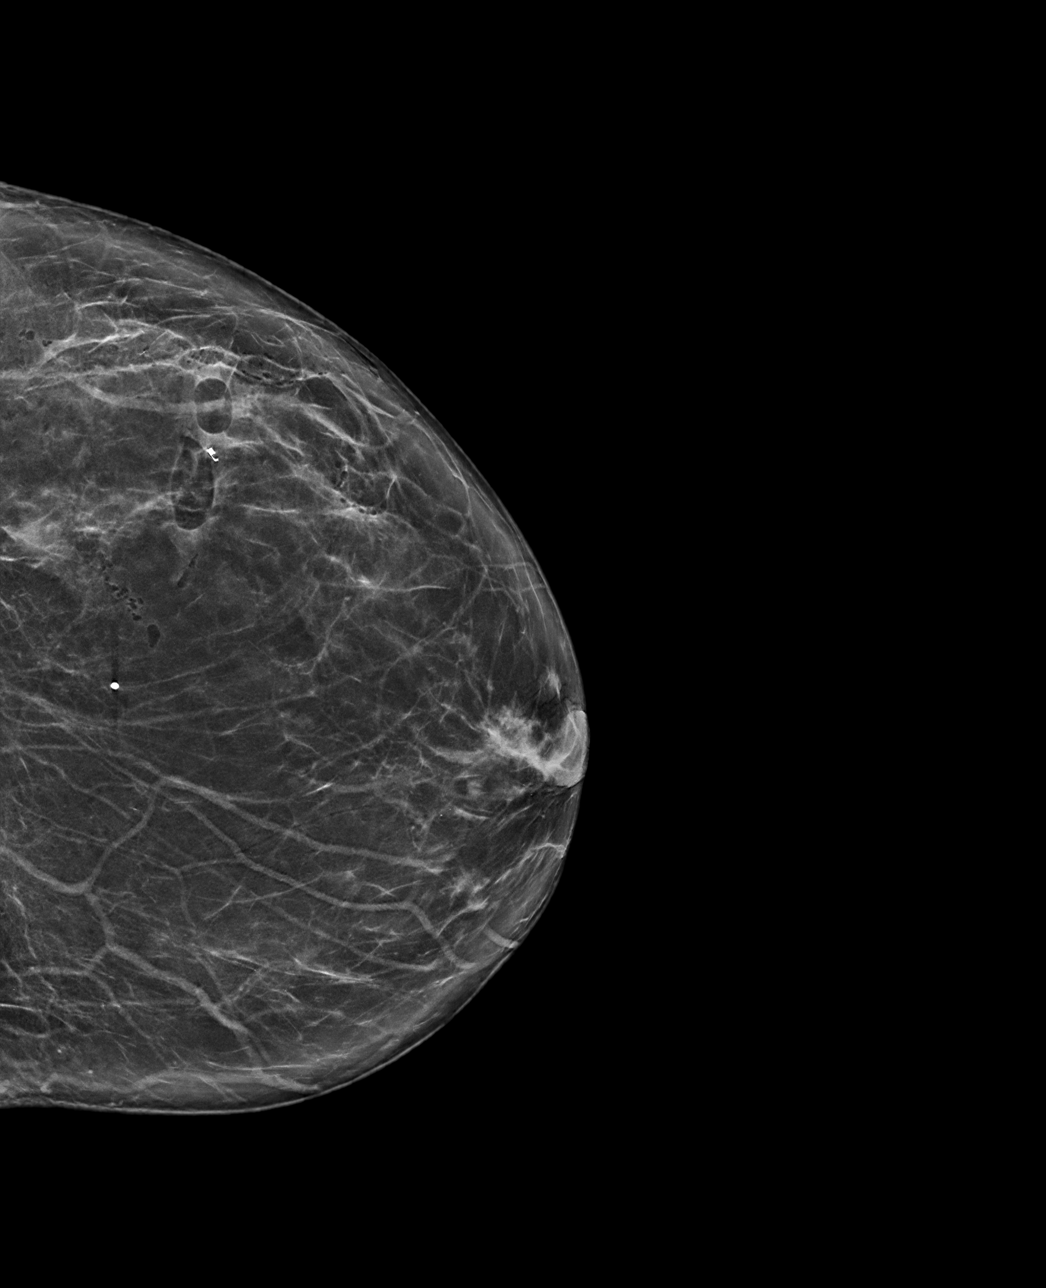

[L LM tomo · tomo slice 45/89.0]
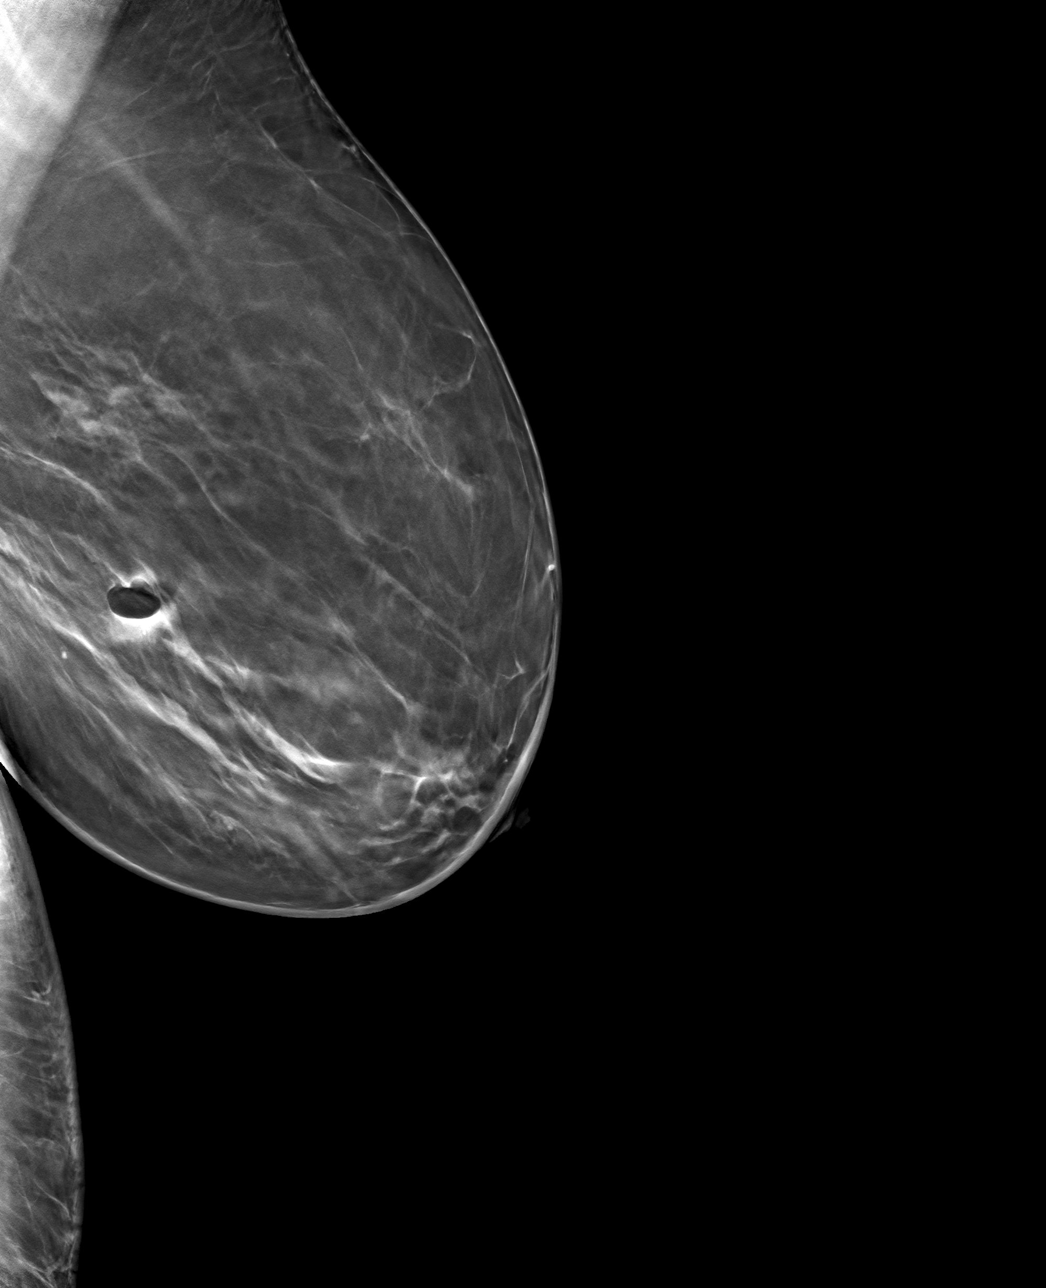

[L CC tomo · tomo slice 40/79.0]
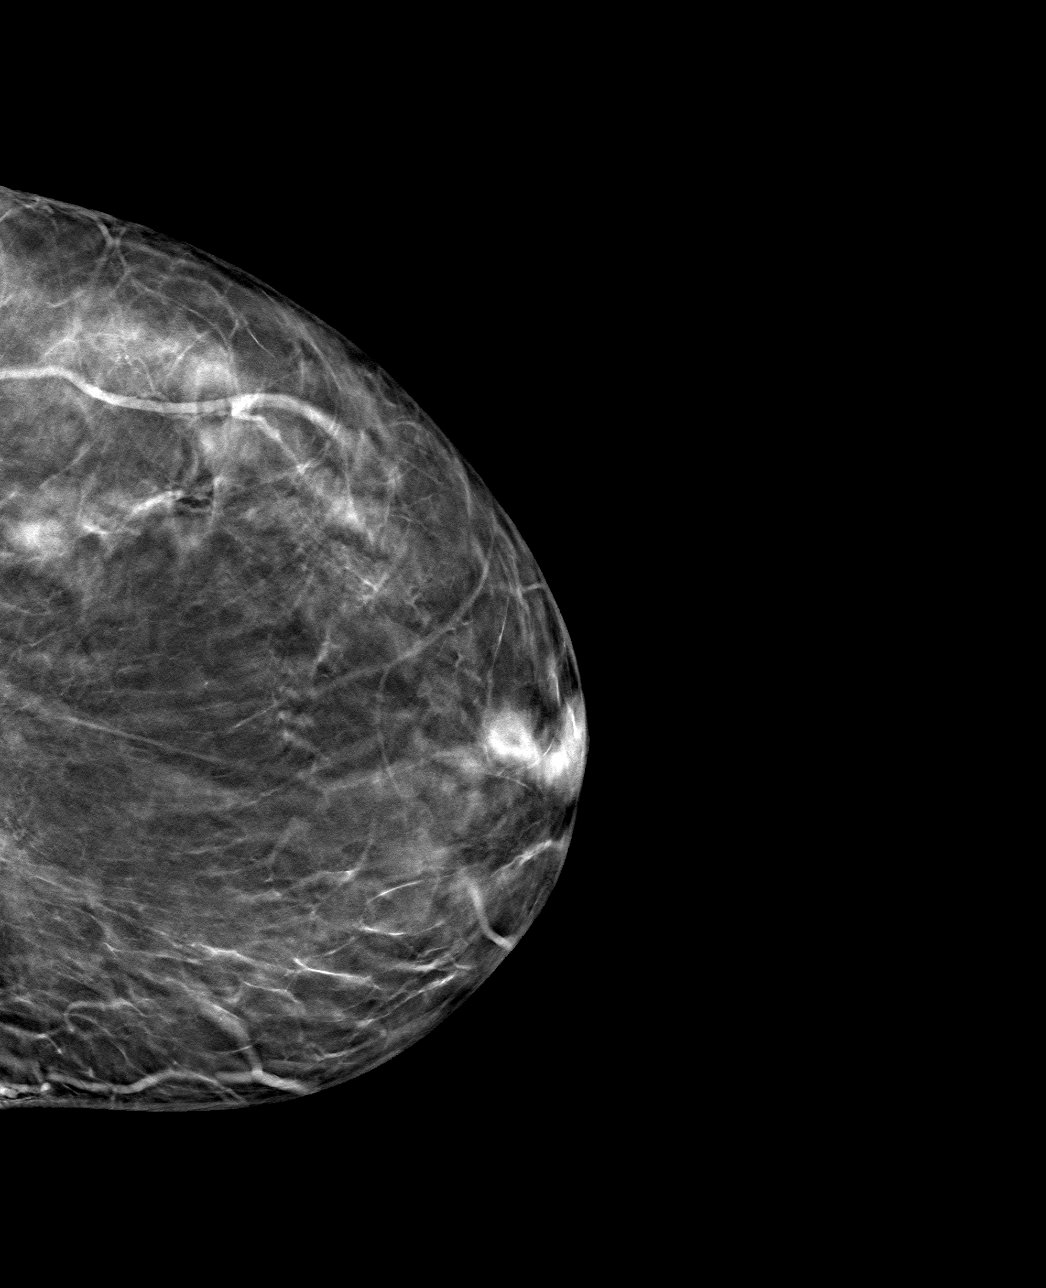

[4 of 12 positions shown; findings below may reference images not displayed]

FINDINGS: Mammographic images were obtained following stereotactic guided
biopsy of left breast mass. The biopsy marking clip is in expected
position at the site of biopsy.
IMPRESSION: Appropriate positioning of the coil shaped biopsy marking clip at
the site of biopsy in the mass within the lower outer left breast.

Final Assessment: Post Procedure Mammograms for Marker Placement

## 2019-08-28 DIAGNOSIS — I10 Essential (primary) hypertension: Secondary | ICD-10-CM | POA: Diagnosis not present

## 2019-08-28 DIAGNOSIS — E782 Mixed hyperlipidemia: Secondary | ICD-10-CM | POA: Diagnosis not present

## 2019-09-26 DIAGNOSIS — E7849 Other hyperlipidemia: Secondary | ICD-10-CM | POA: Diagnosis not present

## 2019-09-26 DIAGNOSIS — M1611 Unilateral primary osteoarthritis, right hip: Secondary | ICD-10-CM | POA: Diagnosis not present

## 2019-09-26 DIAGNOSIS — I10 Essential (primary) hypertension: Secondary | ICD-10-CM | POA: Diagnosis not present

## 2019-10-08 DIAGNOSIS — E1165 Type 2 diabetes mellitus with hyperglycemia: Secondary | ICD-10-CM | POA: Diagnosis not present

## 2019-10-08 DIAGNOSIS — S81802A Unspecified open wound, left lower leg, initial encounter: Secondary | ICD-10-CM | POA: Diagnosis not present

## 2019-10-08 DIAGNOSIS — Z6828 Body mass index (BMI) 28.0-28.9, adult: Secondary | ICD-10-CM | POA: Diagnosis not present

## 2019-10-13 DIAGNOSIS — S81802A Unspecified open wound, left lower leg, initial encounter: Secondary | ICD-10-CM | POA: Diagnosis not present

## 2019-10-13 DIAGNOSIS — E1165 Type 2 diabetes mellitus with hyperglycemia: Secondary | ICD-10-CM | POA: Diagnosis not present

## 2019-10-24 DIAGNOSIS — E7849 Other hyperlipidemia: Secondary | ICD-10-CM | POA: Diagnosis not present

## 2019-10-24 DIAGNOSIS — I10 Essential (primary) hypertension: Secondary | ICD-10-CM | POA: Diagnosis not present

## 2019-11-20 DIAGNOSIS — L97522 Non-pressure chronic ulcer of other part of left foot with fat layer exposed: Secondary | ICD-10-CM | POA: Diagnosis not present

## 2019-11-20 DIAGNOSIS — D492 Neoplasm of unspecified behavior of bone, soft tissue, and skin: Secondary | ICD-10-CM | POA: Diagnosis not present

## 2019-11-26 DIAGNOSIS — I10 Essential (primary) hypertension: Secondary | ICD-10-CM | POA: Diagnosis not present

## 2019-11-26 DIAGNOSIS — L97522 Non-pressure chronic ulcer of other part of left foot with fat layer exposed: Secondary | ICD-10-CM | POA: Diagnosis not present

## 2019-11-26 DIAGNOSIS — E7849 Other hyperlipidemia: Secondary | ICD-10-CM | POA: Diagnosis not present

## 2020-02-09 ENCOUNTER — Other Ambulatory Visit: Payer: Self-pay | Admitting: General Practice

## 2020-02-09 DIAGNOSIS — N1831 Chronic kidney disease, stage 3a: Secondary | ICD-10-CM

## 2020-02-11 ENCOUNTER — Encounter (INDEPENDENT_AMBULATORY_CARE_PROVIDER_SITE_OTHER): Payer: Medicare HMO | Admitting: Ophthalmology

## 2020-02-12 NOTE — Progress Notes (Signed)
[facility] Note  02/13/2020     CHIEF COMPLAINT Patient presents for Retina Follow Up   HISTORY OF PRESENT ILLNESS: Nicole Golden is a 59 y.o. female who presents to the clinic today for:   HPI    Retina Follow Up    Patient presents with  Diabetic Retinopathy.  In both eyes.  This started weeks ago.  Severity is moderate.  Duration of weeks.  Since onset it is gradually worsening.  I, the attending physician,  performed the HPI with the patient and updated documentation appropriately.          Comments    BS: 131 yesterday A1c: 14.0 (2 months ago) Pt states her vision in her left eye became blurry on Sunday and got better over the next couple of days.  Pt states when she walked into work this week, her left eye became blurry again and has not improved.  Pt denies eye pain.  Pt denies any new or worsening floaters or fol OU.       Last edited by Bernarda Caffey, MD on 02/13/2020  2:50 PM. (History)    pt states she has been having blurry vision in her left eye, pt states on Sunday she was working and her vision suddenly went cloudy, she states blinking and eye drops did not help, she states it eventually cleared up, but happened again on Monday when she was walking into work   Referring physician: [name], [place],  [place] [address]  HISTORICAL INFORMATION:   Selected notes from the [facility] Referred by [facility] for DM eye exam   CURRENT MEDICATIONS: No current outpatient medications on file. (Ophthalmic Drugs)   No current facility-administered medications for this visit. (Ophthalmic Drugs)   Current Outpatient Medications (Other)  Medication Sig  . acetaminophen (TYLENOL) 500 MG tablet Take 500 mg by mouth every 6 (six) hours as needed for fever.  . hydrochlorothiazide (HYDRODIURIL) 25 MG tablet Take 25 mg by mouth daily.  . Insulin Aspart (NOVOLOG FLEXPEN [place]) Inject 10-16 Units into the skin 3 (three)  times daily.  . insulin aspart protamine- aspart (NOVOLOG MIX 70/30) (70-30) 100 UNIT/ML injection Inject 10-16 Units into the skin. Each meal  . insulin glargine (LANTUS) 100 UNIT/ML injection Inject 43 Units into the skin 2 (two) times daily.   Marland Kitchen JARDIANCE 25 MG TABS tablet   . linaclotide (LINZESS) 290 MCG CAPS capsule Take 1 capsule (290 mcg total) by mouth daily before breakfast. (Patient taking differently: Take 290 mcg by mouth as needed. )  . lovastatin (MEVACOR) 20 MG tablet Take 20 mg by mouth at bedtime.   No current facility-administered medications for this visit. (Other)      REVIEW OF SYSTEMS: ROS    Positive for: Endocrine, Eyes   Negative for: Constitutional, Gastrointestinal, Neurological, Skin, Genitourinary, Musculoskeletal, HENT, Cardiovascular, Respiratory, Psychiatric, Allergic/Imm, Heme/Lymph   Last edited by Doneen Poisson on 02/13/2020  1:37 PM. (History)       ALLERGIES Allergies  Allergen Reactions  . Ciprofloxacin Hives    PAST MEDICAL HISTORY Past Medical History:  Diagnosis Date  . Diabetes mellitus without complication ([place])   . Diabetic neuropathy ([place])   . Diabetic neuropathy ([place])   . Hypertension    Past Surgical History:  Procedure Laterality Date  . CATARACT EXTRACTION    . EYE SURGERY    . FOOT SURGERY     right foot  .  TUBAL LIGATION      FAMILY HISTORY Family History  Problem Relation Age of Onset  . Breast cancer Paternal Aunt   . Colon cancer Neg Hx   . Colon polyps Neg Hx     SOCIAL HISTORY Social History   Tobacco Use  . Smoking status: Former Research scientist (life sciences)  . Smokeless tobacco: Never Used  Substance Use Topics  . Alcohol use: Yes    Comment: occas  . Drug use: Never         OPHTHALMIC EXAM:  Base Eye Exam    Visual Acuity (Snellen - Linear)      Right Left   Dist cc 20/20 20/50 +2   Dist ph cc  NI   Correction: Glasses       Tonometry (Tonopen, 1:41 PM)      Right Left   Pressure 19 17        Pupils      Dark Light Shape React APD   Right 3 2 Round Minimal 0   Left 3 2 Round Minimal 0       Visual Fields      Left Right    Full Full       Extraocular Movement      Right Left    Full Full       Neuro/Psych    Oriented x3: Yes   Mood/Affect: Normal       Dilation    Both eyes: 1.0% Mydriacyl, 2.5% Phenylephrine @ 1:41 PM        Slit Lamp and Fundus Exam    Slit Lamp Exam      Right Left   Lids/Lashes Dermatochalasis - upper lid Dermatochalasis - upper lid   Conjunctiva/Sclera Mild Melanosis Mild Melanosis   Cornea Arcus, well healed temporal cataract wounds, trace Punctate epithelial erosions Mild Arcus, well healed temporal cataract wounds, trace Punctate epithelial erosions, +endopigment--Krukenberg spindle   Anterior Chamber Deep and quiet Deep, 1-2+pigment   Iris Round and dilated, No NVI Round and dilated, No NVI   Lens PC IOL in good position PC IOL in good position, ?tilt, mild pigment deposition   Vitreous Mild Vitreous syneresis Mild Vitreous syneresis       Fundus Exam      Right Left   Disc Pallor, Sharp rim, temporal PPA Pink and Sharp, mild PPA   C/D Ratio 0.2 0.2   Macula Flat, Blunted foveal reflex, Retinal pigment epithelial mottling, No heme or edema Flat, Blunted foveal reflex, Retinal pigment epithelial mottling, No heme or edema   Vessels Vascular attenuation Vascular attenuation, mild Tortuous, AV crossing changes   Periphery Attached, mild reticular degeneration   Attached, rare peripheral drusen, mild blot hemes nasal periphery          IMAGING AND PROCEDURES  Imaging and Procedures for @TODAY @  OCT, Retina - OU - Both Eyes       Right Eye Quality was good. Central Foveal Thickness: 271. Progression has been stable. Findings include normal foveal contour, no IRF, no SRF, vitreomacular adhesion .   Left Eye Quality was good. Central Foveal Thickness: 274. Progression has been stable. Findings include normal foveal contour,  no IRF, no SRF, vitreomacular adhesion  (Partial PVD).   Notes *Images captured and stored on drive  Diagnosis / Impression:  NFP, no IRF/SRF OU No DME OU  Clinical management:  See below  Abbreviations: NFP - Normal foveal profile. CME - cystoid macular edema. PED - pigment epithelial detachment. IRF -  intraretinal fluid. SRF - subretinal fluid. EZ - ellipsoid zone. ERM - epiretinal membrane. ORA - outer retinal atrophy. ORT - outer retinal tubulation. SRHM - subretinal hyper-reflective material                 ASSESSMENT/PLAN:    ICD-10-CM   1. Pigment dispersion syndrome of left eye  H21.232   2. Diabetes mellitus type 2 without retinopathy ([place])  E11.9   3. Retinal edema  H35.81 OCT, Retina - OU - Both Eyes  4. Essential hypertension  I10   5. Hypertensive retinopathy of both eyes  H35.033   6. Pseudophakia of both eyes  Z96.1     1. Pigment dispersion syndrome OS  - Pt presents acutely today with complaint of episodes of blurry/hazy vision OS x5 days  - BCVA 20/50 OS today  - exam shows scattered fine pigment in AC and ant vit + Krukenberg spindle  - IOL OS with mild tilt -- ?etiology of PDS  - IOP okay at 17  - discussed findings, prognosis  - will refer to [facility] for eval of PDS OS and establishment of primary eye care  - f/u in 6 mos  1,2. Diabetes mellitus, type 2 without retinopathy  - The incidence, risk factors for progression, natural history and treatment options for diabetic retinopathy  were discussed with patient.    - The need for close monitoring of blood glucose, blood pressure, and serum lipids, avoiding cigarette or any type of tobacco, and the need for long term follow up was also discussed with patient.  - f/u in 6 mos to1 year, sooner prn -- okay to f/u with optometrist or general ophthalmologist  3,4. Hypertensive retinopathy OU  - discussed importance of tight BP control  - monitor  5. Pseudophakia OU  - s/p CE/IOL both eyes  [facility])  - doing well  - ? Mild tilt of PCIOL OS  - monitor   Ophthalmic Meds Ordered this visit:  No orders of the defined types were placed in this encounter.      Return in about 6 months (around 08/14/2020) for f/u DM exam, DFE, OCT.  There are no Patient Instructions on file for this visit.   Explained the diagnoses, plan, and follow up with the patient and they expressed understanding.  Patient expressed understanding of the importance of proper follow up care.   This document serves as a record of services personally performed by Gardiner Sleeper, MD, PhD. It was created on their behalf by Ernest Mallick, OA, an ophthalmic assistant. The creation of this record is the provider's dictation and/or activities during the visit.    Electronically signed by: Ernest Mallick, OA 06.17.2021 1:22 AM   Gardiner Sleeper, M.D., Ph.D. Diseases & Surgery of the Retina and Vitreous [facility]  I have reviewed the above documentation for accuracy and completeness, and I agree with the above. Gardiner Sleeper, M.D., Ph.D. 02/15/20 1:22 AM   Abbreviations: M myopia (nearsighted); A astigmatism; H hyperopia (farsighted); P presbyopia; Mrx spectacle prescription;  CTL contact lenses; OD right eye; OS left eye; OU both eyes  XT exotropia; ET esotropia; PEK punctate epithelial keratitis; PEE punctate epithelial erosions; DES dry eye syndrome; MGD meibomian gland dysfunction; ATs artificial tears; PFAT's preservative free artificial tears; [place] nuclear sclerotic cataract; PSC posterior subcapsular cataract; ERM epi-retinal membrane; PVD posterior vitreous detachment; RD retinal detachment; DM diabetes mellitus; DR diabetic retinopathy; NPDR non-proliferative diabetic retinopathy; PDR  proliferative diabetic retinopathy; CSME clinically significant macular edema; DME diabetic macular edema; dbh dot blot hemorrhages; CWS cotton wool spot; POAG primary open angle glaucoma; C/D  cup-to-disc ratio; HVF humphrey visual field; GVF goldmann visual field; OCT optical coherence tomography; IOP intraocular pressure; BRVO Branch retinal vein occlusion; CRVO central retinal vein occlusion; CRAO central retinal artery occlusion; BRAO branch retinal artery occlusion; RT retinal tear; SB scleral buckle; PPV pars plana vitrectomy; VH Vitreous hemorrhage; PRP panretinal laser photocoagulation; IVK intravitreal kenalog; VMT vitreomacular traction; MH Macular hole;  NVD neovascularization of the disc; NVE neovascularization elsewhere; AREDS age related eye disease study; ARMD age related macular degeneration; POAG primary open angle glaucoma; EBMD epithelial/anterior basement membrane dystrophy; ACIOL anterior chamber intraocular lens; IOL intraocular lens; PCIOL posterior chamber intraocular lens; Phaco/IOL phacoemulsification with intraocular lens placement; [place] photorefractive keratectomy; LASIK laser assisted in situ keratomileusis; HTN hypertension; DM diabetes mellitus; COPD chronic obstructive pulmonary disease

## 2020-02-13 ENCOUNTER — Ambulatory Visit (INDEPENDENT_AMBULATORY_CARE_PROVIDER_SITE_OTHER): Payer: Medicare HMO | Admitting: Ophthalmology

## 2020-02-13 ENCOUNTER — Telehealth: Payer: Self-pay

## 2020-02-13 ENCOUNTER — Other Ambulatory Visit: Payer: Self-pay

## 2020-02-13 DIAGNOSIS — H21232 Degeneration of iris (pigmentary), left eye: Secondary | ICD-10-CM | POA: Diagnosis not present

## 2020-02-13 DIAGNOSIS — I10 Essential (primary) hypertension: Secondary | ICD-10-CM | POA: Diagnosis not present

## 2020-02-13 DIAGNOSIS — E119 Type 2 diabetes mellitus without complications: Secondary | ICD-10-CM | POA: Diagnosis not present

## 2020-02-13 DIAGNOSIS — H35033 Hypertensive retinopathy, bilateral: Secondary | ICD-10-CM

## 2020-02-13 DIAGNOSIS — Z961 Presence of intraocular lens: Secondary | ICD-10-CM

## 2020-02-13 DIAGNOSIS — H3581 Retinal edema: Secondary | ICD-10-CM

## 2020-02-13 NOTE — Telephone Encounter (Signed)
NOTES ON FILE FROM [facility] [phone]. SENT TO SCHEDULING

## 2020-02-15 ENCOUNTER — Encounter (INDEPENDENT_AMBULATORY_CARE_PROVIDER_SITE_OTHER): Payer: Self-pay | Admitting: Ophthalmology

## 2020-02-19 ENCOUNTER — Ambulatory Visit
Admission: RE | Admit: 2020-02-19 | Discharge: 2020-02-19 | Disposition: A | Discharge status: Home / Self Care | Payer: Medicare HMO | Source: Ambulatory Visit | Attending: General Practice | Admitting: General Practice

## 2020-02-19 DIAGNOSIS — N1831 Chronic kidney disease, stage 3a: Secondary | ICD-10-CM

## 2020-02-19 IMAGING — US US RENAL
1 series · 14 of 25 positions shown · non-contrast
Comparison: None.

CLINICAL DATA: Hypertension diabetes

EXAM:
RENAL / URINARY TRACT ULTRASOUND COMPLETE

[Series 1: us renal · 0.26mm/px · 14 of 38 slices shown]
[im 1/38]
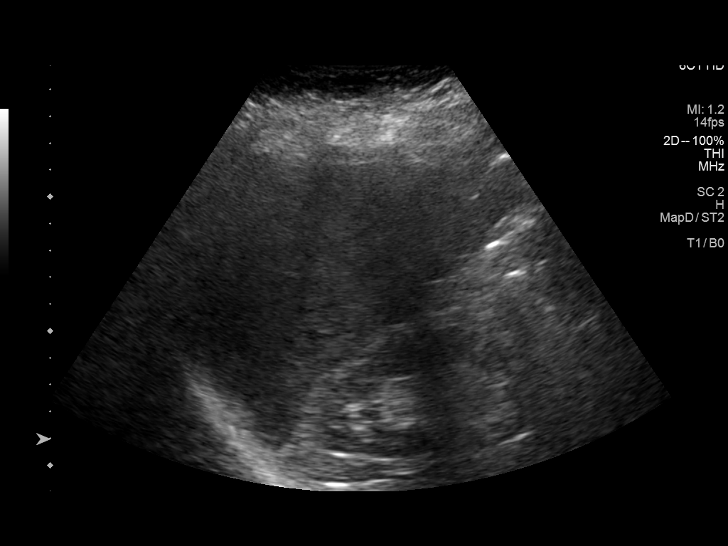
[im 4/38]
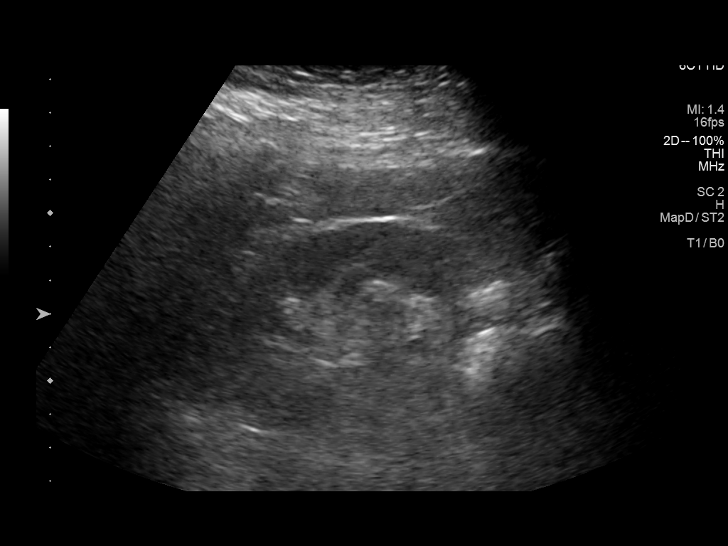
[im 7/38]
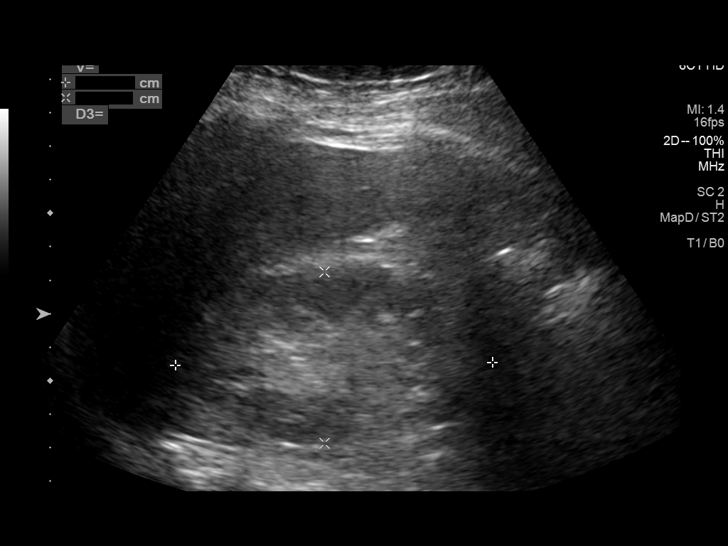
[im 10/38]
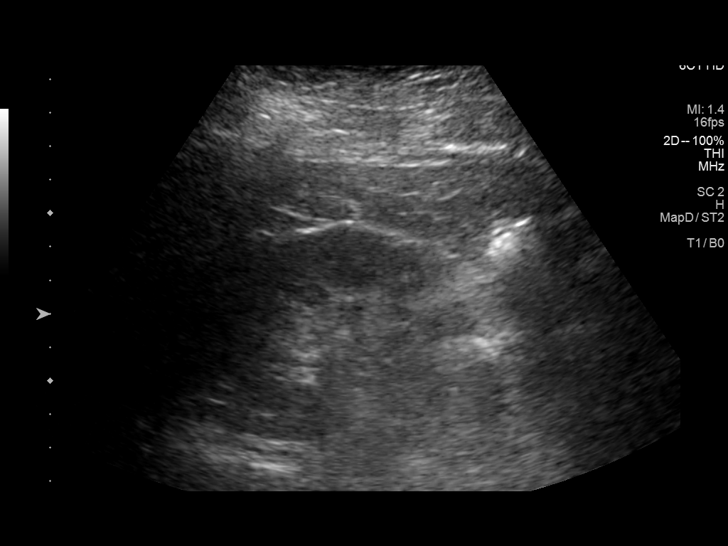
[im 13/38]
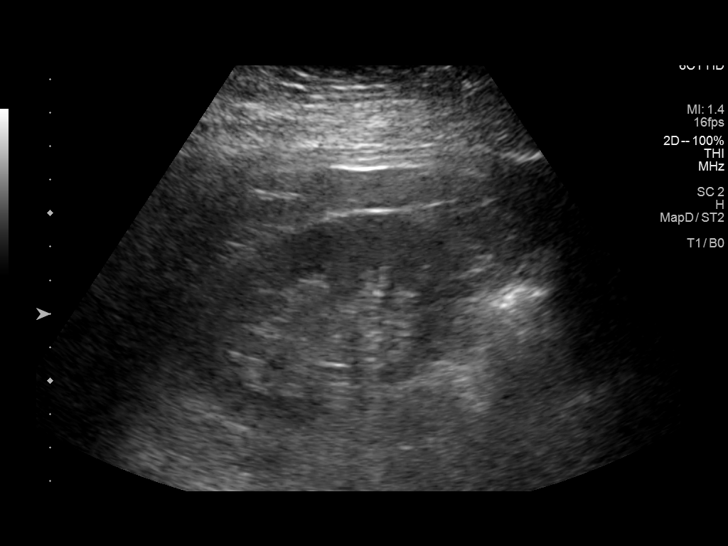
[im 14/38]
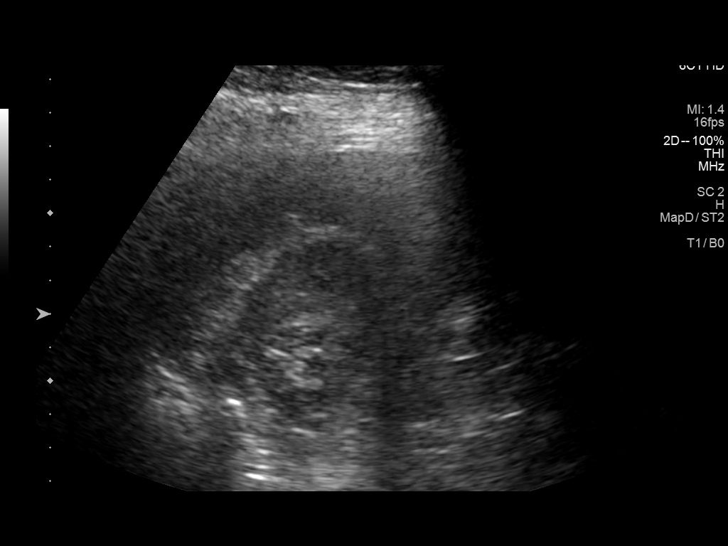
[im 17/38]
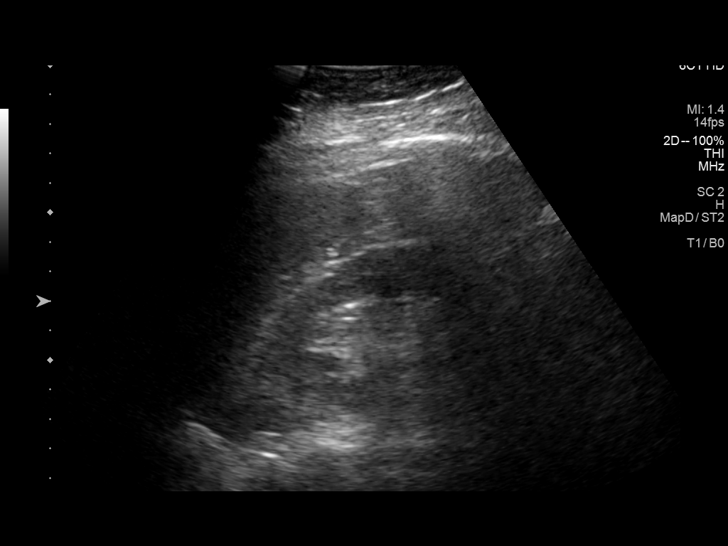
[im 21/38]
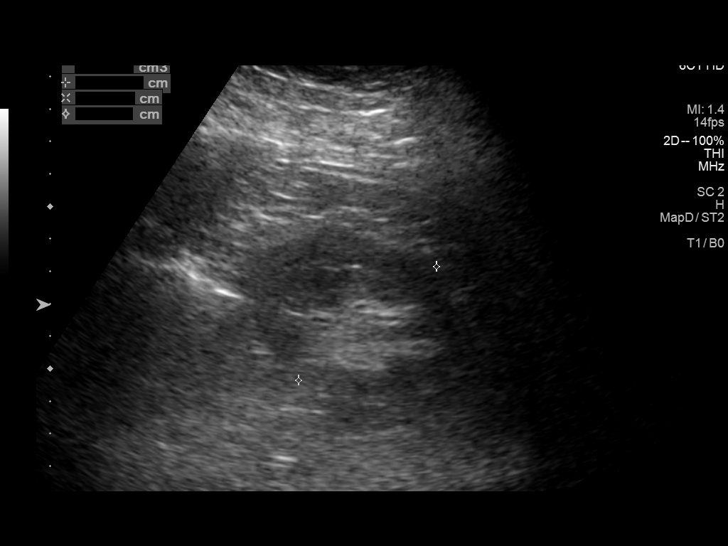
[im 24/38]
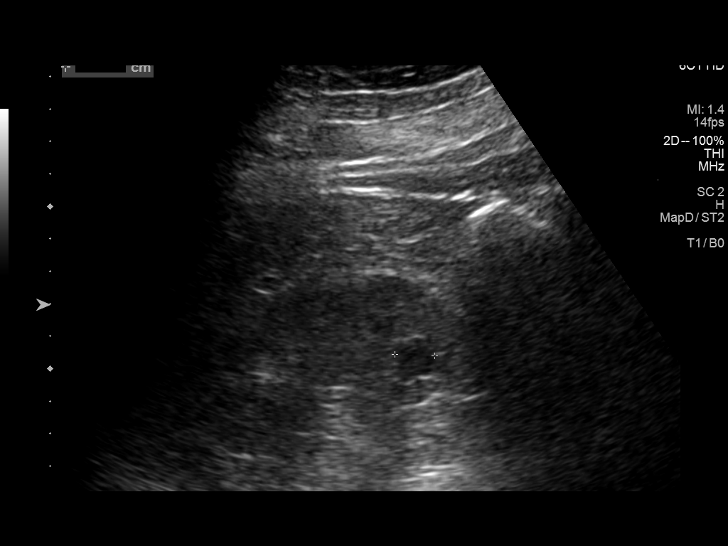
[im 25/38]
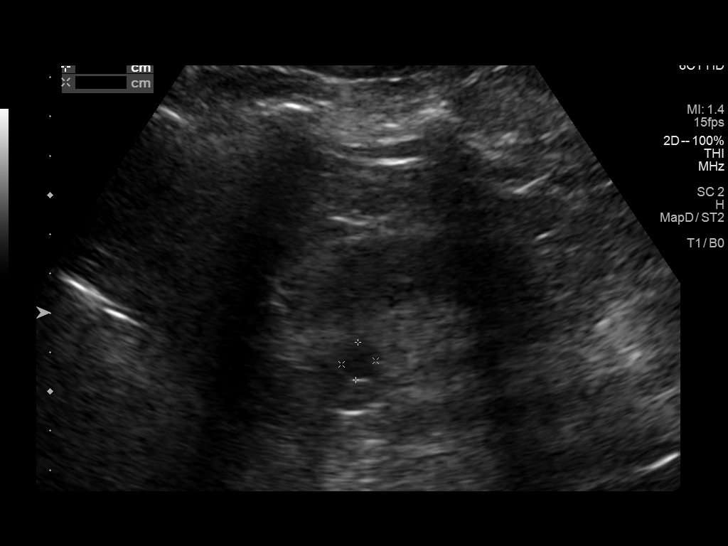
[im 28/38]
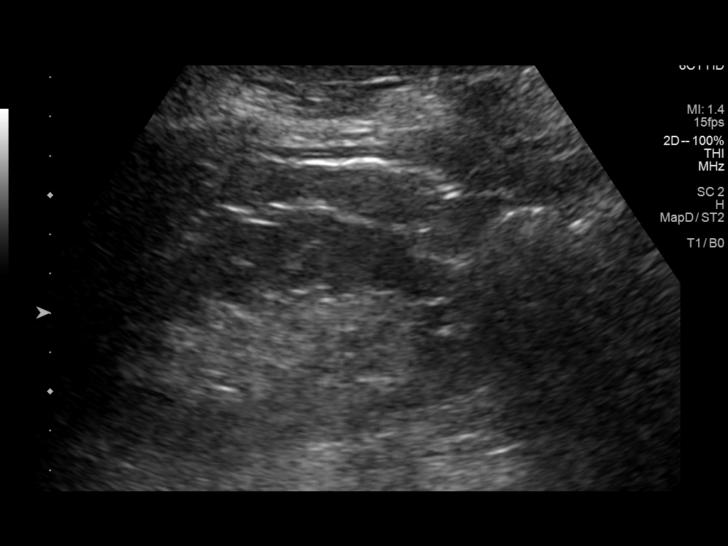
[im 31/38]
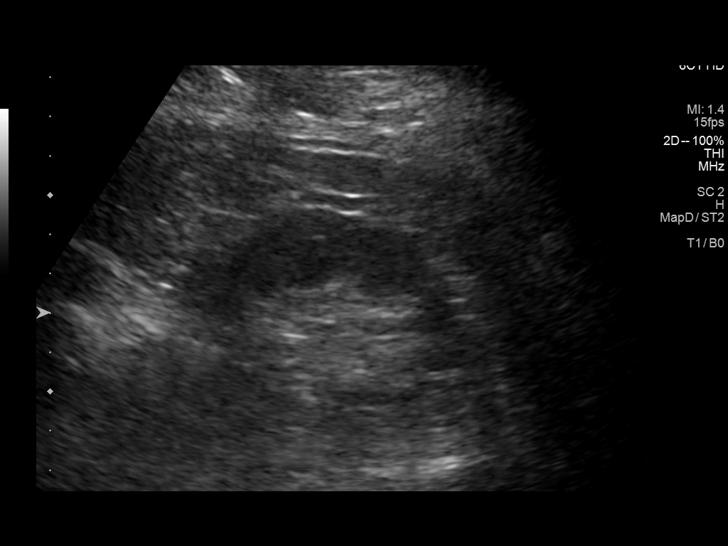
[im 34/38]
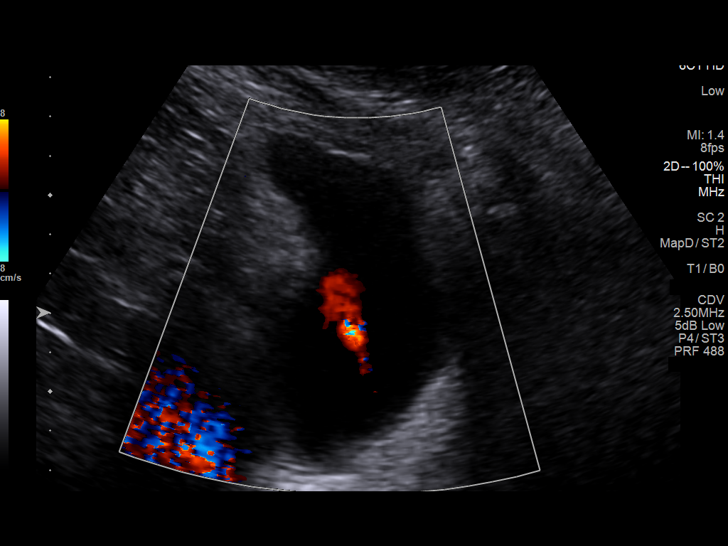
[im 38/38]
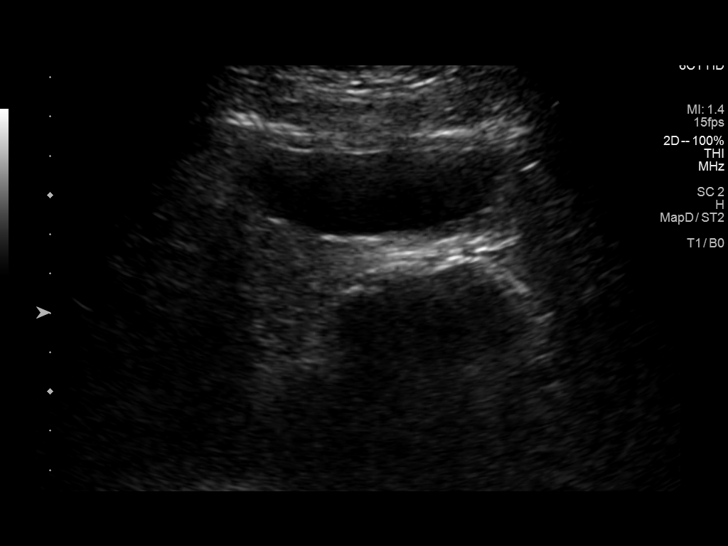

[14 of 25 positions shown; findings below may reference images not displayed]

FINDINGS: Right Kidney:

Renal measurements: 9.5 x 5.1 x 5.7 cm = volume: 140 mL .
Echogenicity within normal limits. No mass or hydronephrosis
visualized.

Left Kidney:

Renal measurements: 10.5 x 5.7 x 5.5 cm = volume: 173.9 mL. Cortical
echogenicity normal. No hydronephrosis. Hypoechoic cyst in the lower
pole measuring 1.2 mm.

Bladder:

Appears normal for degree of bladder distention.

Other:

None.
IMPRESSION: 1. Negative for hydronephrosis.
2. Small cyst in the lower pole of the left kidney.

## 2020-02-24 ENCOUNTER — Ambulatory Visit: Payer: Medicare HMO | Admitting: Cardiovascular Disease

## 2020-03-12 ENCOUNTER — Ambulatory Visit: Payer: Medicare HMO | Admitting: Cardiology

## 2020-03-23 ENCOUNTER — Emergency Department (HOSPITAL_COMMUNITY)
Admission: EM | Admit: 2020-03-23 | Discharge: 2020-03-24 | Disposition: A | Discharge status: Home / Self Care | Payer: Medicare HMO | Attending: Emergency Medicine | Admitting: Emergency Medicine

## 2020-03-23 ENCOUNTER — Encounter (HOSPITAL_COMMUNITY): Payer: Self-pay | Admitting: Emergency Medicine

## 2020-03-23 ENCOUNTER — Other Ambulatory Visit: Payer: Self-pay

## 2020-03-23 DIAGNOSIS — R2241 Localized swelling, mass and lump, right lower limb: Secondary | ICD-10-CM | POA: Diagnosis present

## 2020-03-23 DIAGNOSIS — E11621 Type 2 diabetes mellitus with foot ulcer: Secondary | ICD-10-CM

## 2020-03-23 DIAGNOSIS — E11622 Type 2 diabetes mellitus with other skin ulcer: Secondary | ICD-10-CM | POA: Diagnosis not present

## 2020-03-23 DIAGNOSIS — Z79899 Other long term (current) drug therapy: Secondary | ICD-10-CM | POA: Diagnosis not present

## 2020-03-23 DIAGNOSIS — I10 Essential (primary) hypertension: Secondary | ICD-10-CM | POA: Insufficient documentation

## 2020-03-23 DIAGNOSIS — L98491 Non-pressure chronic ulcer of skin of other sites limited to breakdown of skin: Secondary | ICD-10-CM | POA: Insufficient documentation

## 2020-03-23 DIAGNOSIS — Z87891 Personal history of nicotine dependence: Secondary | ICD-10-CM | POA: Diagnosis not present

## 2020-03-23 LAB — CBC WITH DIFFERENTIAL/PLATELET
Abs Immature Granulocytes: 0.03 10*3/uL (ref 0.00–0.07)
Basophils Absolute: 0.1 10*3/uL (ref 0.0–0.1)
Basophils Relative: 1 %
Eosinophils Absolute: 0.4 10*3/uL (ref 0.0–0.5)
Eosinophils Relative: 6 %
HCT: 40.8 % (ref 36.0–46.0)
Hemoglobin: 12.3 g/dL (ref 12.0–15.0)
Immature Granulocytes: 1 %
Lymphocytes Relative: 31 %
Lymphs Abs: 2 10*3/uL (ref 0.7–4.0)
MCH: 24.5 pg — ABNORMAL LOW (ref 26.0–34.0)
MCHC: 30.1 g/dL (ref 30.0–36.0)
MCV: 81.3 fL (ref 80.0–100.0)
Monocytes Absolute: 0.4 10*3/uL (ref 0.1–1.0)
Monocytes Relative: 6 %
Neutro Abs: 3.6 10*3/uL (ref 1.7–7.7)
Neutrophils Relative %: 55 %
Platelets: 245 10*3/uL (ref 150–400)
RBC: 5.02 MIL/uL (ref 3.87–5.11)
RDW: 14.3 % (ref 11.5–15.5)
WBC: 6.4 10*3/uL (ref 4.0–10.5)
nRBC: 0 % (ref 0.0–0.2)

## 2020-03-23 LAB — COMPREHENSIVE METABOLIC PANEL
ALT: 32 U/L (ref 0–44)
AST: 27 U/L (ref 15–41)
Albumin: 3.4 g/dL — ABNORMAL LOW (ref 3.5–5.0)
Alkaline Phosphatase: 86 U/L (ref 38–126)
Anion gap: 10 (ref 5–15)
BUN: 20 mg/dL (ref 6–20)
CO2: 24 mmol/L (ref 22–32)
Calcium: 9 mg/dL (ref 8.9–10.3)
Chloride: 104 mmol/L (ref 98–111)
Creatinine, Ser: 0.95 mg/dL (ref 0.44–1.00)
GFR calc Af Amer: >60 mL/min (ref >60)
GFR calc non Af Amer: >60 mL/min (ref >60)
Glucose, Bld: 317 mg/dL — ABNORMAL HIGH (ref 70–99)
Potassium: 4 mmol/L (ref 3.5–5.1)
Sodium: 138 mmol/L (ref 135–145)
Total Bilirubin: 0.7 mg/dL (ref 0.3–1.2)
Total Protein: 6.7 g/dL (ref 6.5–8.1)

## 2020-03-23 LAB — LACTIC ACID, PLASMA: Lactic Acid, Venous: 1.3 mmol/L (ref 0.5–1.9)

## 2020-03-23 NOTE — ED Triage Notes (Signed)
Pt reports she was sent to ED by urgent care for an infected big toe on her left foot.  Pt is a diabetic.

## 2020-03-24 ENCOUNTER — Emergency Department (HOSPITAL_COMMUNITY): Payer: Medicare HMO

## 2020-03-24 DIAGNOSIS — E11622 Type 2 diabetes mellitus with other skin ulcer: Secondary | ICD-10-CM | POA: Diagnosis not present

## 2020-03-24 LAB — CBG MONITORING, ED: Glucose-Capillary: 84 mg/dL (ref 70–99)

## 2020-03-24 IMAGING — DX DG TOE GREAT 2+V*L*
3 series · 3 of 3 positions shown · non-contrast
Comparison: None.

CLINICAL DATA: Infected diabetic toe ulcer.

EXAM:
LEFT GREAT TOE

[toe ap]
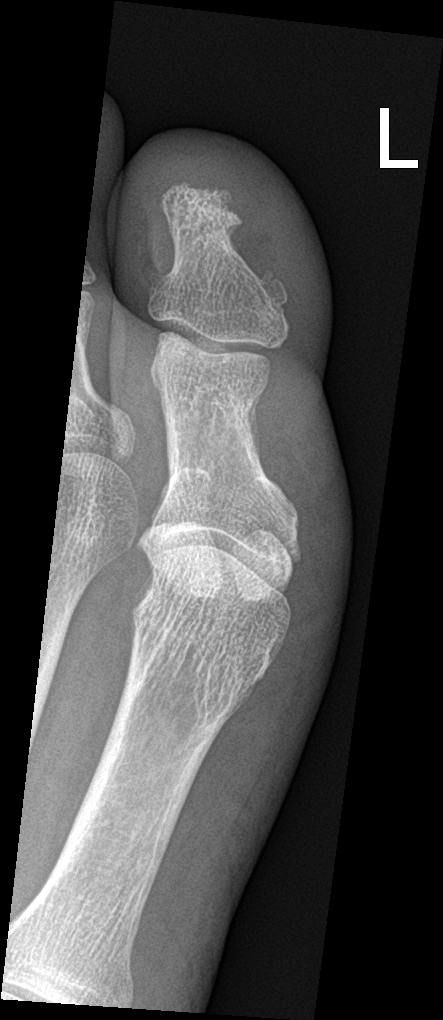

[toe obl]
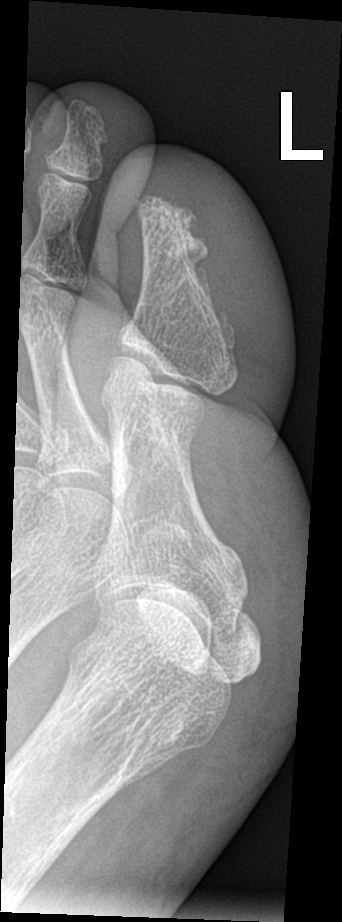

[toe lat]
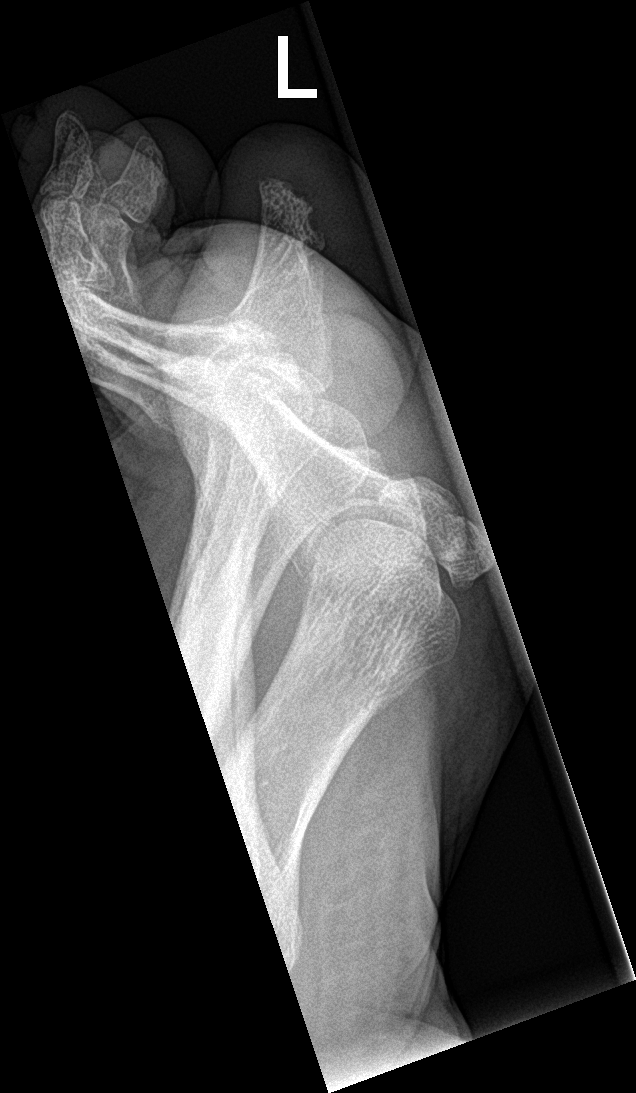

[3 of 3 positions shown; findings below may reference images not displayed]

FINDINGS: Three views of the left great toe were obtained. Negative for
fracture or dislocation. Mild cortical irregularity along the medial
aspect the great toe tuft. No periosteal reaction.
IMPRESSION: Cortical irregularity involving the great toe tuft. Findings could
be related to cortical destruction and osteomyelitis.

## 2020-03-24 MED ORDER — SULFAMETHOXAZOLE-TRIMETHOPRIM 800-160 MG PO TABS: 1.0000 | ORAL_TABLET | Freq: Two times a day (BID) | ORAL | 0 refills | Status: AC

## 2020-03-24 NOTE — Discharge Instructions (Signed)
Please call your podiatry office.  Ideally you should be seen within the next 24 to 48 hours for close monitoring of your toe.  Take antibiotic as prescribed.  Return to ER if you develop worsening swelling, redness, fever or other new concerning symptom.

## 2020-03-24 NOTE — ED Provider Notes (Signed)
[facility] EMERGENCY DEPARTMENT Provider Note   CSN: [id number] Arrival date & time: 03/23/20  2059     History Chief Complaint  Patient presents with  . infected toe    Nicole Golden is a 59 y.o. female.  Presents to ER with concern for infected big toe.  Patient reports she has history of diabetes, diabetic neuropathy, followed by podiatry in the outpatient setting.  Dr. Barkley Bruns.  Couple weeks ago had a small blister rupture on her big toe as noted some mild swelling.  No erythema, no fever.  Not painful.  Denies any other concerns.  No known trauma.  HPI     Past Medical History:  Diagnosis Date  . Diabetes mellitus without complication ([place])   . Diabetic neuropathy ([place])   . Diabetic neuropathy ([place])   . Hypertension     Patient Active Problem List   Diagnosis Date Noted  . Constipation 05/08/2018  . DKA (diabetic ketoacidoses) ([place]) 04/24/2016  . Abdominal wall cellulitis 04/24/2016  . IDDM (insulin dependent diabetes mellitus) 04/24/2016    Past Surgical History:  Procedure Laterality Date  . CATARACT EXTRACTION    . EYE SURGERY    . FOOT SURGERY     right foot  . TUBAL LIGATION       OB History   No obstetric history on file.     Family History  Problem Relation Age of Onset  . Breast cancer Paternal Aunt   . Colon cancer Neg Hx   . Colon polyps Neg Hx     Social History   Tobacco Use  . Smoking status: Former Research scientist (life sciences)  . Smokeless tobacco: Never Used  Substance Use Topics  . Alcohol use: Yes    Comment: occas  . Drug use: Never    Home Medications Prior to Admission medications   Medication Sig Start Date End Date Taking? Authorizing Provider  acetaminophen (TYLENOL) 500 MG tablet Take 500 mg by mouth every 6 (six) hours as needed for fever.   Yes [provider]  ALAWAY 0.025 % ophthalmic solution Place 1 drop into both eyes daily as needed for dry eyes. 03/04/20  Yes [provider]  furosemide  (LASIX) 20 MG tablet Take 20 mg by mouth daily as needed for edema. 03/04/20  Yes [provider]  hydrochlorothiazide (HYDRODIURIL) 25 MG tablet Take 25 mg by mouth daily.   Yes [provider]  LANTUS SOLOSTAR 100 UNIT/ML Solostar Pen Inject 66 Units into the skin in the morning and at bedtime. AM and HS 03/04/20  Yes [provider]  losartan (COZAAR) 25 MG tablet Take 25 mg by mouth daily. 03/23/20  Yes [provider]  lovastatin (MEVACOR) 20 MG tablet Take 20 mg by mouth at bedtime.   Yes [provider]  NOVOLOG FLEXPEN 100 UNIT/ML FlexPen Inject 10 Units into the skin in the morning, at noon, and at bedtime. 03/04/20  Yes [provider]  rosuvastatin (CRESTOR) 10 MG tablet Take 10 mg by mouth daily. 03/04/20  Yes [provider]  sulfamethoxazole-trimethoprim (BACTRIM DS) 800-160 MG tablet Take 1 tablet by mouth 2 (two) times daily for 7 days. 03/24/20 03/31/20  Lucrezia Starch, MD    Allergies    Ciprofloxacin  Review of Systems   Review of Systems  Constitutional: Negative for chills and fever.  HENT: Negative for ear pain and sore throat.   Eyes: Negative for pain and visual disturbance.  Respiratory: Negative for cough and  shortness of breath.   Cardiovascular: Negative for chest pain and palpitations.  Gastrointestinal: Negative for abdominal pain and vomiting.  Genitourinary: Negative for dysuria and hematuria.  Musculoskeletal: Negative for arthralgias and back pain.  Skin: Negative for color change and rash.  Neurological: Negative for seizures and syncope.  All other systems reviewed and are negative.   Physical Exam Updated Vital Signs BP (!) 154/86   Pulse 81   Temp 97.8 F (36.6 C) (Oral)   Resp 16   Ht 5\' 8"  (1.727 m)   Wt 85.3 kg   SpO2 97%   BMI 28.59 kg/m   Physical Exam Vitals and nursing note reviewed.  Constitutional:      General: She is not in acute distress.    Appearance: She is  well-developed.  HENT:     Head: Normocephalic and atraumatic.  Eyes:     Conjunctiva/sclera: Conjunctivae normal.  Cardiovascular:     Rate and Rhythm: Normal rate.     Heart sounds: No murmur heard.   Pulmonary:     Effort: Pulmonary effort is normal. No respiratory distress.  Musculoskeletal:     Cervical back: Neck supple.     Comments: L foot: mild swelling over distal tip of great toe, there is no active drainage, no overlying erythema, normal dp/pt pulses  Skin:    General: Skin is warm and dry.     Capillary Refill: Capillary refill takes less than 2 seconds.  Neurological:     General: No focal deficit present.     Mental Status: She is alert.  Psychiatric:        Mood and Affect: Mood normal.        Behavior: Behavior normal.   Media Information   Document Information  Photos    03/24/2020 08:44  Attached To:  Hospital Encounter on 03/23/20  Source Information  Davionte Lusby, Ellwood Dense, MD  [facility]     ED Results / Procedures / Treatments   Labs (all labs ordered are listed, but only abnormal results are displayed) Labs Reviewed  COMPREHENSIVE METABOLIC PANEL - Abnormal; Notable for the following components:      Result Value   Glucose, Bld 317 (*)    Albumin 3.4 (*)    All other components within normal limits  CBC WITH DIFFERENTIAL/PLATELET - Abnormal; Notable for the following components:   MCH 24.5 (*)    All other components within normal limits  LACTIC ACID, PLASMA  CBG MONITORING, ED    EKG None  Radiology DG Toe Great Left  Result Date: 03/24/2020 CLINICAL DATA:  Infected diabetic toe ulcer. EXAM: LEFT GREAT TOE COMPARISON:  None. FINDINGS: Three views of the left great toe were obtained. Negative for fracture or dislocation. Mild cortical irregularity along the medial aspect the great toe tuft. No periosteal reaction. IMPRESSION: Cortical irregularity involving the great toe tuft. Findings could be related to cortical destruction and  osteomyelitis. Electronically Signed   By: Markus Daft M.D.   On: 03/24/2020 08:19    Procedures Procedures (including critical care time)  Medications Ordered in ED Medications - No data to display  ED Course  I have reviewed the triage vital signs and the nursing notes.  Pertinent labs & imaging results that were available during my care of the patient were reviewed by me and considered in my medical decision making (see chart for details).  Clinical Course as of Mar 24 1230  Wed Mar 24, 2020  0908 D/w Barkley Bruns, concern for  cortical irregularity on xr, rec Septra, close f/u in their clinic and he will monitor the wound   [RD]    Clinical Course User Index [RD] Lucrezia Starch, MD   MDM Rules/Calculators/A&P                          59 year old lady presents here with concern for possible toe infection.  On exam there is slight swelling to the distal toe, possible area of small induration, no overlying erythema, no active drainage.  Labs grossly normal, vital stable, patient not septic or ill-appearing.  Screening x-ray negative for fracture but did demonstrate some cortical irregularity recent possibility of osteomyelitis.  I reviewed case in detail with patient's primary podiatrist, Dr. Barkley Bruns. He recommends starting a course of Bactrim and will monitor closely in outpatient setting. Provided rx, instructed patient on need for close f/u in clinic and reviewed return precautions in detail.     After the discussed management above, the patient was determined to be safe for discharge.  The patient was in agreement with this plan and all questions regarding their care were answered.  ED return precautions were discussed and the patient will return to the ED with any significant worsening of condition.   Final Clinical Impression(s) / ED Diagnoses Final diagnoses:  Diabetic ulcer of toe of left foot associated with type 2 diabetes mellitus, limited to breakdown of skin ([place])    Rx  / DC Orders ED Discharge Orders         Ordered    sulfamethoxazole-trimethoprim (BACTRIM DS) 800-160 MG tablet  2 times daily     Discontinue  Reprint     03/24/20 0600           Lucrezia Starch, MD 03/24/20 1231

## 2020-03-25 ENCOUNTER — Encounter: Payer: Self-pay | Admitting: Cardiology

## 2020-03-25 ENCOUNTER — Ambulatory Visit (INDEPENDENT_AMBULATORY_CARE_PROVIDER_SITE_OTHER): Payer: Medicare HMO | Admitting: Cardiology

## 2020-03-25 VITALS — BP 158/78 | HR 80 | Ht 68.0 in | Wt 189.0 lb

## 2020-03-25 DIAGNOSIS — R600 Localized edema: Secondary | ICD-10-CM

## 2020-03-25 DIAGNOSIS — R0600 Dyspnea, unspecified: Secondary | ICD-10-CM

## 2020-03-25 DIAGNOSIS — I10 Essential (primary) hypertension: Secondary | ICD-10-CM

## 2020-03-25 DIAGNOSIS — R0609 Other forms of dyspnea: Secondary | ICD-10-CM

## 2020-03-25 NOTE — Progress Notes (Signed)
Clinical Summary Ms. Davoli is a 59 y.o.female seen today as a new consult, referred by Dr Posey Pronto for leg edema  1. LE edema - reports recently her HCTZ was stopped, had significant LE edema after - pcp started lasix prn, which has resolved her swelling.  - no SOB or DOE  2. HTN - compliant with meds     Past Medical History:  Diagnosis Date  . Diabetes mellitus without complication ([place])   . Diabetic neuropathy ([place])   . Diabetic neuropathy ([place])   . Hypertension      Allergies  Allergen Reactions  . Ciprofloxacin Hives     Current Outpatient Medications  Medication Sig Dispense Refill  . acetaminophen (TYLENOL) 500 MG tablet Take 500 mg by mouth every 6 (six) hours as needed for fever.    . ALAWAY 0.025 % ophthalmic solution Place 1 drop into both eyes daily as needed for dry eyes.    . furosemide (LASIX) 20 MG tablet Take 20 mg by mouth daily as needed for edema.    . hydrochlorothiazide (HYDRODIURIL) 25 MG tablet Take 25 mg by mouth daily.    Marland Kitchen LANTUS SOLOSTAR 100 UNIT/ML Solostar Pen Inject 66 Units into the skin in the morning and at bedtime. AM and HS    . losartan (COZAAR) 25 MG tablet Take 25 mg by mouth daily.    Marland Kitchen lovastatin (MEVACOR) 20 MG tablet Take 20 mg by mouth at bedtime.    Marland Kitchen NOVOLOG FLEXPEN 100 UNIT/ML FlexPen Inject 10 Units into the skin in the morning, at noon, and at bedtime.    . rosuvastatin (CRESTOR) 10 MG tablet Take 10 mg by mouth daily.    Marland Kitchen sulfamethoxazole-trimethoprim (BACTRIM DS) 800-160 MG tablet Take 1 tablet by mouth 2 (two) times daily for 7 days. 14 tablet 0   No current facility-administered medications for this visit.     Past Surgical History:  Procedure Laterality Date  . CATARACT EXTRACTION    . EYE SURGERY    . FOOT SURGERY     right foot  . TUBAL LIGATION       Allergies  Allergen Reactions  . Ciprofloxacin Hives      Family History  Problem Relation Age of Onset  . Breast cancer Paternal Aunt   .  Colon cancer Neg Hx   . Colon polyps Neg Hx      Social History Ms. Chaloux reports that she has quit smoking. She has never used smokeless tobacco. Ms. Rota reports current alcohol use.   Review of Systems CONSTITUTIONAL: No weight loss, fever, chills, weakness or fatigue.  HEENT: Eyes: No visual loss, blurred vision, double vision or yellow sclerae.No hearing loss, sneezing, congestion, runny nose or sore throat.  SKIN: No rash or itching.  CARDIOVASCULAR: per hpi RESPIRATORY: No shortness of breath, cough or sputum.  GASTROINTESTINAL: No anorexia, nausea, vomiting or diarrhea. No abdominal pain or blood.  GENITOURINARY: No burning on urination, no polyuria NEUROLOGICAL: No headache, dizziness, syncope, paralysis, ataxia, numbness or tingling in the extremities. No change in bowel or bladder control.  MUSCULOSKELETAL: No muscle, back pain, joint pain or stiffness.  LYMPHATICS: No enlarged nodes. No history of splenectomy.  PSYCHIATRIC: No history of depression or anxiety.  ENDOCRINOLOGIC: No reports of sweating, cold or heat intolerance. No polyuria or polydipsia.  Marland Kitchen   Physical Examination Today's Vitals   03/25/20 1057  BP: (!) 158/78  Pulse: 80  SpO2: 96%  Weight: 189 lb (85.7 kg)  Height: 5\' 8"  (1.727 m)   Body mass index is 28.74 kg/m.  Gen: resting comfortably, no acute distress HEENT: no scleral icterus, pupils equal round and reactive, no palptable cervical adenopathy,  CV: RRR, no m/r,g, no jvd Resp: Clear to auscultation bilaterally GI: abdomen is soft, non-tender, non-distended, normal bowel sounds, no hepatosplenomegaly MSK: extremities are warm, no edema.  Skin: warm, no rash Neuro:  no focal deficits Psych: appropriate affect     Assessment and Plan  1. Leg edema - resolved with prn lasix - unclear etiology, we will obtain an echo to look for any underlying cardiac dysfunction at the cause - continue prn lasix - EKG today shows SR, no ischemci  changes  2. HTN - elevated today but was at goal at recent pcp visit - reports ongoing foot pains - defer to pcp, room to titrate ARB if needed   F/u pending echo results, if benign echo can f/u as needed.    Arnoldo Lenis, M.D.

## 2020-03-25 NOTE — Patient Instructions (Signed)
Medication Instructions:  *If you need a refill on your cardiac medications before your next appointment, please call your pharmacy*  Lab Work: If you have labs (blood work) drawn today and your tests are completely normal, you will receive your results only by: Marland Kitchen MyChart Message (if you have MyChart) OR . A paper copy in the mail If you have any lab test that is abnormal or we need to change your treatment, we will call you to review the results.  Testing/Procedures: Your physician has requested that you have an echocardiogram. Echocardiography is a painless test that uses sound waves to create images of your heart. It provides your doctor with information about the size and shape of your heart and how well your heart's chambers and valves are working. This procedure takes approximately one hour. There are no restrictions for this procedure.  Follow-Up: At [facility], you and your health needs are our priority.  As part of our continuing mission to provide you with exceptional heart care, we have created designated Provider Care Teams.  These Care Teams include your primary Cardiologist (physician) and Advanced Practice Providers (APPs -  Physician Assistants and Nurse Practitioners) who all work together to provide you with the care you need, when you need it.  We recommend signing up for the patient portal called "MyChart".  Sign up information is provided on this After Visit Summary.  MyChart is used to connect with patients for Virtual Visits (Telemedicine).  Patients are able to view lab/test results, encounter notes, upcoming appointments, etc.  Non-urgent messages can be sent to your provider as well.   To learn more about what you can do with MyChart, go to [email or web address].    Your next appointment:   Your follow up will be arranged pending your echocardiogram results.  The format for your next appointment:   In Person with Carlyle Dolly, MD

## 2020-03-25 NOTE — Addendum Note (Signed)
Addended by: Laurine Blazer on: 03/25/2020 01:47 PM   Modules accepted: Orders

## 2020-04-12 ENCOUNTER — Telehealth: Payer: Self-pay | Admitting: Cardiology

## 2020-04-12 NOTE — Telephone Encounter (Signed)
percert-  patient's echo was re-scheduled for 04/27/2020 . Need to verify insurance for this date.

## 2020-04-13 ENCOUNTER — Other Ambulatory Visit: Payer: Medicare HMO

## 2020-04-27 ENCOUNTER — Other Ambulatory Visit: Payer: Medicare HMO

## 2020-05-06 ENCOUNTER — Other Ambulatory Visit: Payer: Medicare HMO

## 2020-05-20 ENCOUNTER — Ambulatory Visit (INDEPENDENT_AMBULATORY_CARE_PROVIDER_SITE_OTHER): Payer: Medicare HMO

## 2020-05-20 DIAGNOSIS — R0609 Other forms of dyspnea: Secondary | ICD-10-CM

## 2020-05-20 DIAGNOSIS — R0600 Dyspnea, unspecified: Secondary | ICD-10-CM

## 2020-05-20 DIAGNOSIS — R600 Localized edema: Secondary | ICD-10-CM

## 2020-05-20 LAB — ECHOCARDIOGRAM COMPLETE
Area-P 1/2: 3.23 cm2
Calc EF: 63 %
MV M vel: 2.61 m/s
MV Peak grad: 27.2 mmHg
S' Lateral: 3.11 cm
Single Plane A2C EF: 61.6 %
Single Plane A4C EF: 61.8 %

## 2020-06-01 ENCOUNTER — Telehealth: Payer: Self-pay | Admitting: *Deleted

## 2020-06-01 NOTE — Telephone Encounter (Signed)
-----   Message from Arnoldo Lenis, MD sent at 05/31/2020  7:09 PM EDT ----- Echo overall looks good, normla heart pumping function. Can f/u just as needed   Zandra Abts MD

## 2020-06-21 ENCOUNTER — Encounter: Payer: Self-pay | Admitting: *Deleted

## 2020-06-21 NOTE — Telephone Encounter (Signed)
Called pt multiple times will mail letter

## 2020-06-22 ENCOUNTER — Other Ambulatory Visit: Payer: Self-pay | Admitting: General Practice

## 2020-06-22 DIAGNOSIS — Z1231 Encounter for screening mammogram for malignant neoplasm of breast: Secondary | ICD-10-CM

## 2020-07-28 ENCOUNTER — Other Ambulatory Visit: Payer: Self-pay

## 2020-07-28 ENCOUNTER — Ambulatory Visit
Admission: RE | Admit: 2020-07-28 | Discharge: 2020-07-28 | Disposition: A | Discharge status: Home / Self Care | Payer: Medicare HMO | Source: Ambulatory Visit | Attending: General Practice | Admitting: General Practice

## 2020-07-28 DIAGNOSIS — Z1231 Encounter for screening mammogram for malignant neoplasm of breast: Secondary | ICD-10-CM

## 2020-07-28 IMAGING — MG DIGITAL SCREENING BILAT W/ TOMO W/ CAD
8 series · 8 of 24 positions shown · non-contrast
Comparison: Previous exam(s).

CLINICAL DATA: Screening.

EXAM:
DIGITAL SCREENING BILATERAL MAMMOGRAM WITH TOMO AND CAD

[L MLO synth-2D]
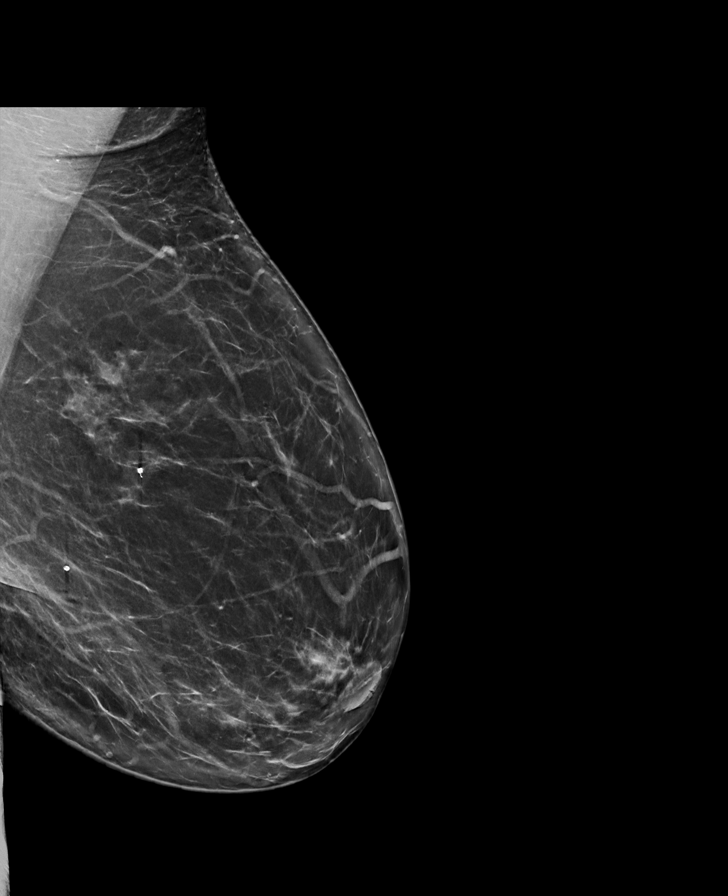

[R CC synth-2D]
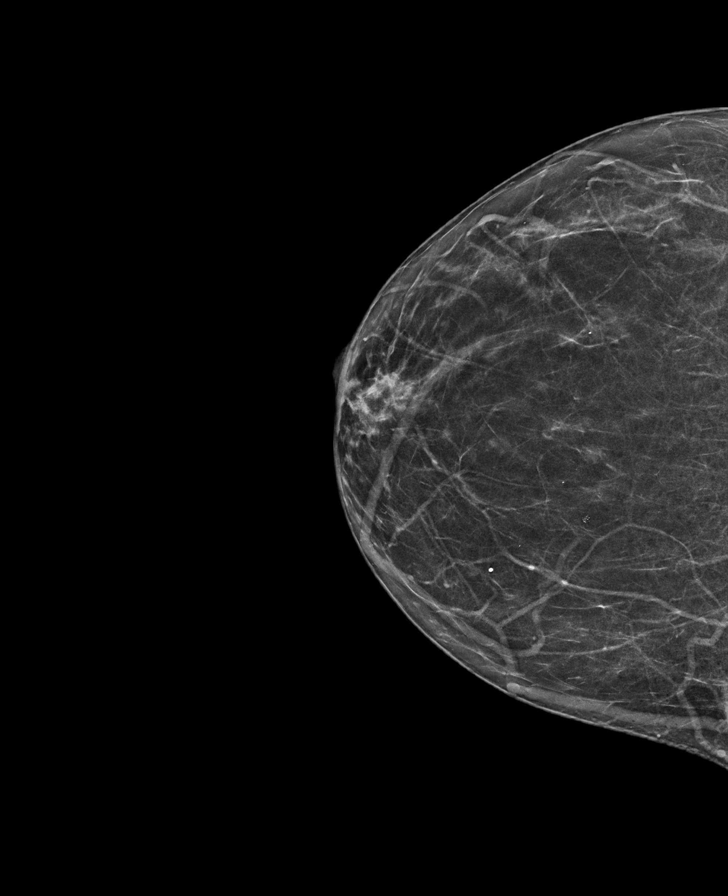

[R MLO synth-2D]
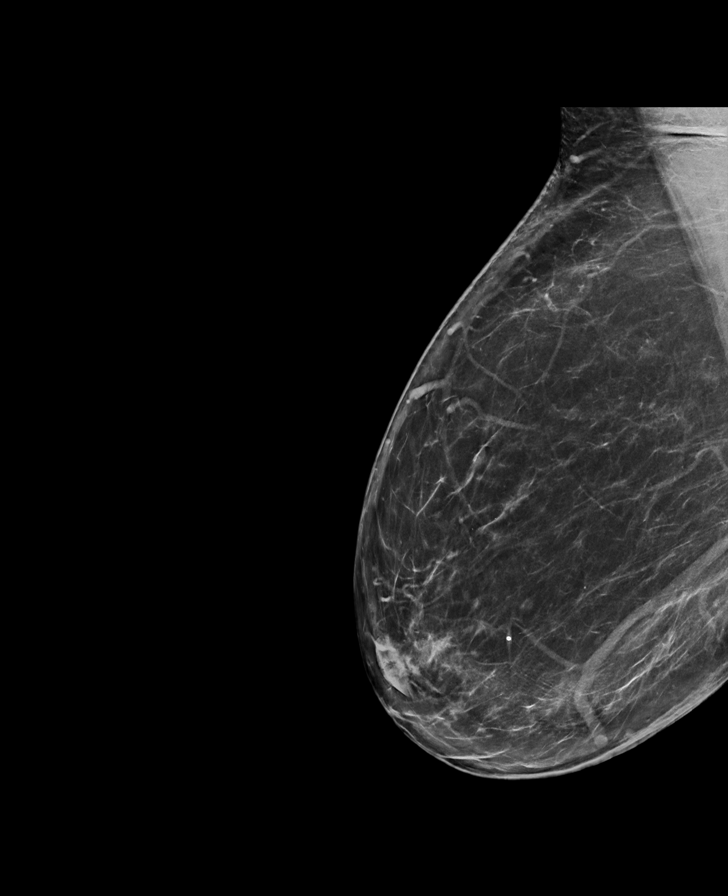

[L CC synth-2D]
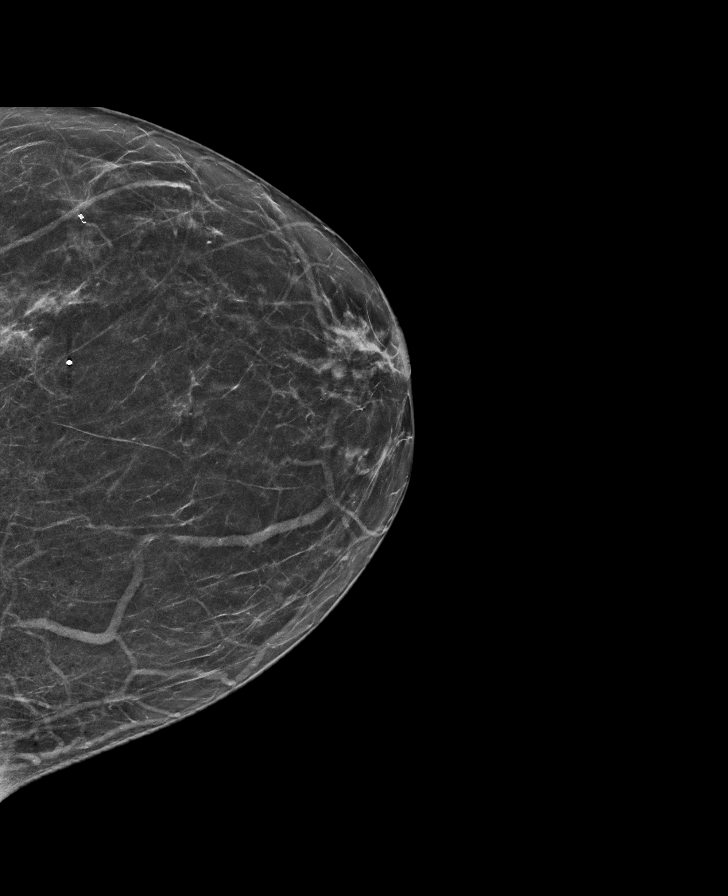

[L MLO tomo · tomo slice 45/88.0]
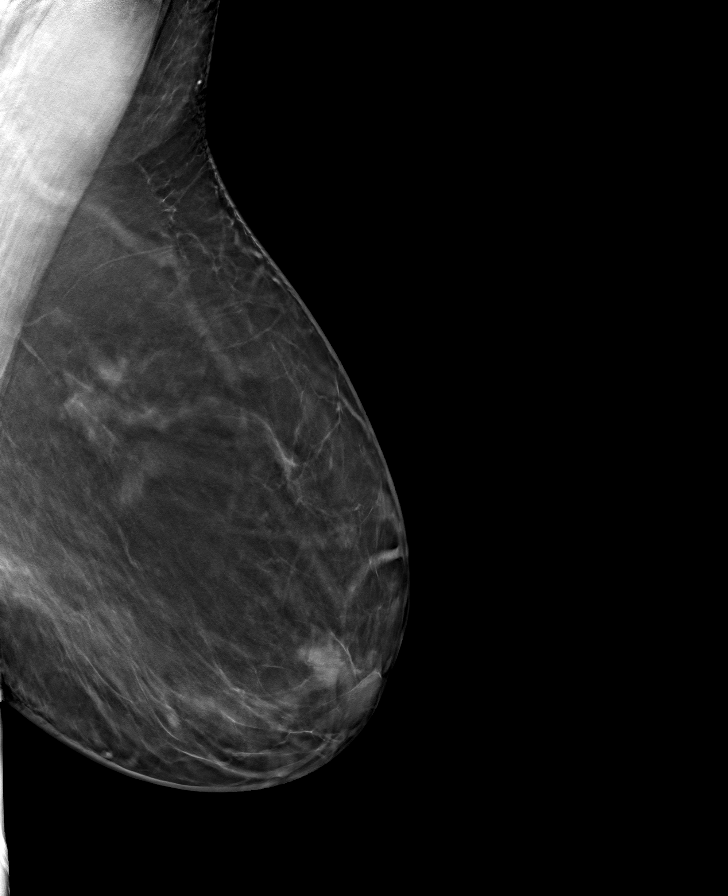

[R CC tomo · tomo slice 33/64.0]
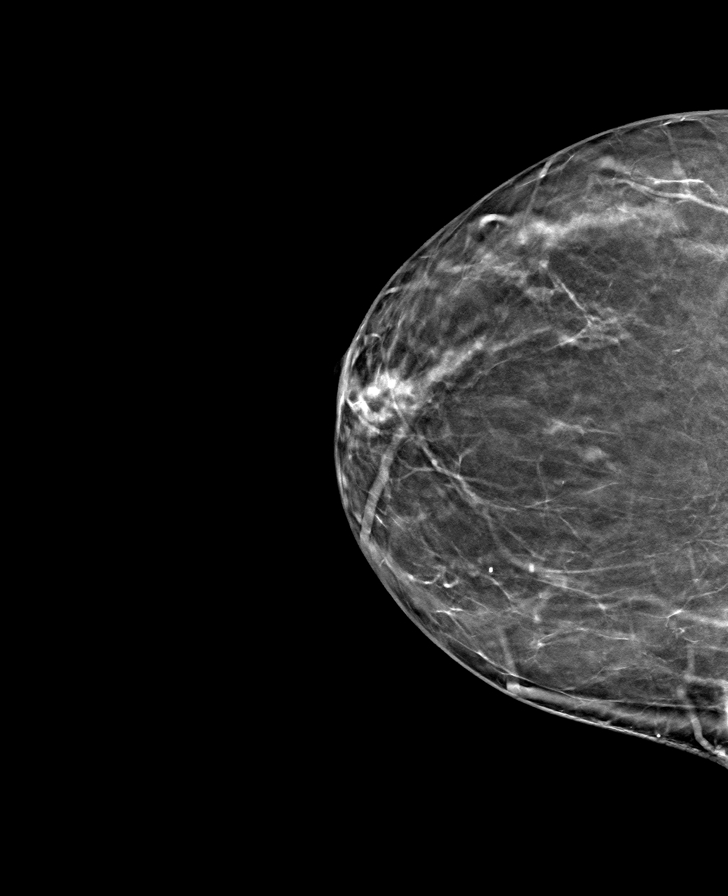

[L CC tomo · tomo slice 33/66.0]
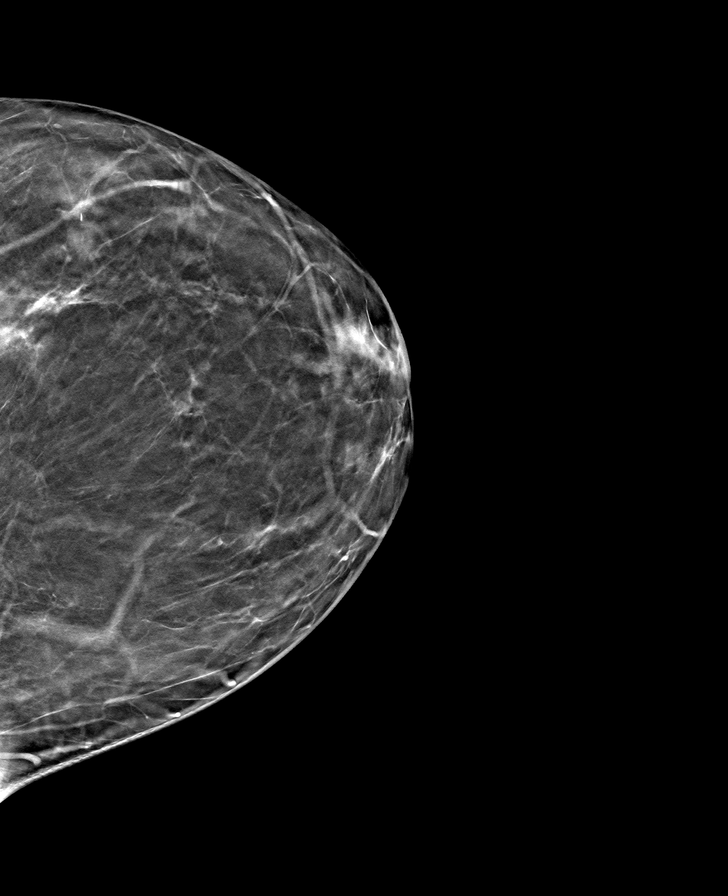

[R MLO tomo · tomo slice 43/86.0]
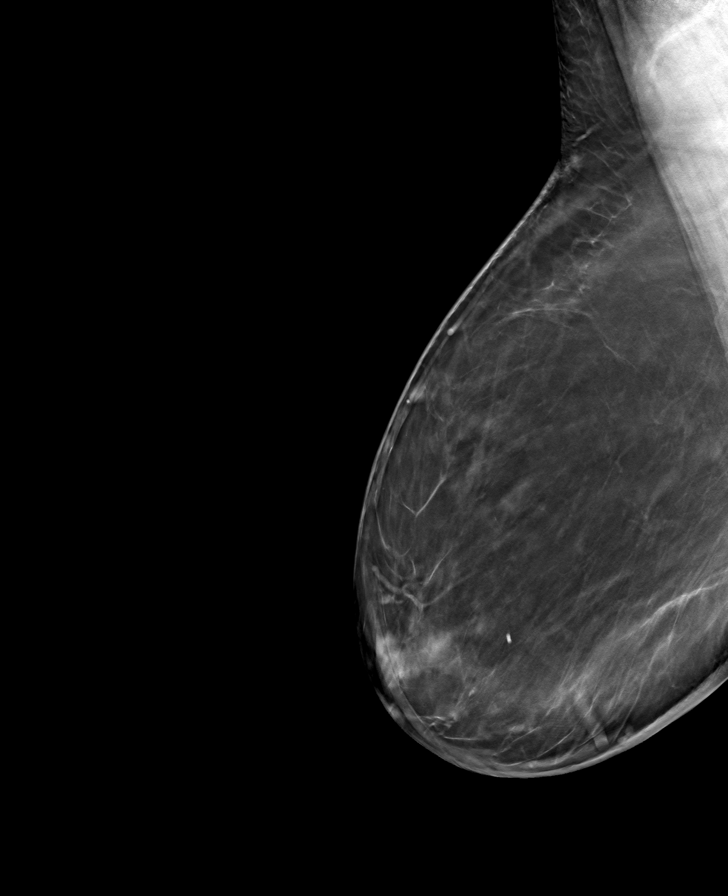

[8 of 24 positions shown; findings below may reference images not displayed]

ACR Breast Density Category b: There are scattered areas of
fibroglandular density.
FINDINGS: There are no findings suspicious for malignancy. Images were
processed with CAD.
IMPRESSION: No mammographic evidence of malignancy. A result letter of this
screening mammogram will be mailed directly to the patient.

RECOMMENDATION:
Screening mammogram in one year. (Code:[TQ])

BI-RADS CATEGORY  1: Negative.

## 2020-08-10 NOTE — Progress Notes (Signed)
[facility] Note  08/11/2020     CHIEF COMPLAINT Patient presents for Retina Follow Up  HISTORY OF PRESENT ILLNESS: Nicole Golden is a 59 y.o. female who presents to the clinic today for:   HPI    Retina Follow Up    Patient presents with  Other.  In left eye.  This started 6 months ago.  Severity is moderate.  Duration of 6 months.  Since onset it is gradually improving.  I, the attending physician,  performed the HPI with the patient and updated documentation appropriately.          Comments    59 y/o female pt here for 6 mo f/u for pigment dispersion syndrome OS.  Being followed by Dr. Elliot Dally, who last saw pt about 2 wks ago.  VA gradually improving OS.  No change in [place] OD.  Denies pain, FOL, floaters.  BS 88 this a.m.  A1C 12.0 in April of 2021.  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  PF BID OS Dorzolamide-Timolol BID OS Atropine QD OS Latanoprost QHS OU       Last edited by Bernarda Caffey, MD on 08/14/2020 11:28 PM. (History)     Referring physician: Practice, [facility] [place],  [place] [address]  HISTORICAL INFORMATION:   Selected notes from the MEDICAL RECORD NUMBER Referred by [facility] for DM eye exam   CURRENT MEDICATIONS: Current Outpatient Medications (Ophthalmic Drugs)  Medication Sig   ALAWAY 0.025 % ophthalmic solution Place 1 drop into both eyes daily as needed for dry eyes.   atropine 1 % ophthalmic solution Place 1 drop into the left eye 2 (two) times daily.   dorzolamide-timolol (COSOPT) 22.3-6.8 MG/ML ophthalmic solution Place 1 drop into the left eye 2 (two) times daily.   latanoprost (XALATAN) 0.005 % ophthalmic solution 1 drop daily.   prednisoLONE acetate (PRED FORTE) 1 % ophthalmic suspension Place 1 drop into the left eye 3 (three) times daily.   No current facility-administered medications for this visit. (Ophthalmic Drugs)   Current Outpatient Medications (Other)  Medication Sig    ACCU-CHEK AVIVA PLUS test strip    acetaminophen (TYLENOL) 500 MG tablet Take 500 mg by mouth every 6 (six) hours as needed for fever.   BD PEN NEEDLE NANO 2ND GEN 32G X 4 MM MISC    furosemide (LASIX) 20 MG tablet Take 20 mg by mouth daily as needed for edema.   hydrochlorothiazide (HYDRODIURIL) 25 MG tablet Take 25 mg by mouth daily.   LANTUS SOLOSTAR 100 UNIT/ML Solostar Pen Inject 66 Units into the skin in the morning and at bedtime. AM and HS   losartan (COZAAR) 25 MG tablet Take 25 mg by mouth daily.   lovastatin (MEVACOR) 20 MG tablet Take 20 mg by mouth at bedtime.   NOVOLOG FLEXPEN 100 UNIT/ML FlexPen Inject 10 Units into the skin in the morning, at noon, and at bedtime.   rosuvastatin (CRESTOR) 10 MG tablet Take 10 mg by mouth daily.   SANTYL ointment    No current facility-administered medications for this visit. (Other)   REVIEW OF SYSTEMS: ROS    Positive for: Endocrine, Eyes   Negative for: Constitutional, Gastrointestinal, Neurological, Skin, Genitourinary, Musculoskeletal, HENT, Cardiovascular, Respiratory, Psychiatric, Allergic/Imm, Heme/Lymph   Last edited by Matthew Folks, COA on 08/11/2020  3:04 PM. (History)     ALLERGIES Allergies  Allergen Reactions   Ciprofloxacin Hives   PAST MEDICAL HISTORY Past  Medical History:  Diagnosis Date   Diabetes mellitus without complication ([facility])    Diabetic neuropathy ([place])    Diabetic neuropathy ([place])    Hypertension    Hypertensive retinopathy    OU   Past Surgical History:  Procedure Laterality Date   CATARACT EXTRACTION Bilateral    EYE SURGERY Bilateral    Cat Sx   FOOT SURGERY     right foot   TUBAL LIGATION     FAMILY HISTORY Family History  Problem Relation Age of Onset   Breast cancer Paternal Aunt    Colon cancer Neg Hx    Colon polyps Neg Hx    SOCIAL HISTORY Social History   Tobacco Use   Smoking status: Former Smoker   Smokeless tobacco: Never Used  Substance Use  Topics   Alcohol use: Yes    Comment: occas   Drug use: Never         OPHTHALMIC EXAM:  Base Eye Exam    Visual Acuity (Snellen - Linear)      Right Left   Dist [place] 20/20 - 20/40 -   Dist ph [place]  20/30 -       Tonometry (Tonopen, 3:12 PM)      Right Left   Pressure 13 14       Pupils      Dark Light Shape React APD   Right 3 2 Round Minimal None   Left 3 2 Round Minimal None       Visual Fields (Counting fingers)      Left Right    Full Full       Extraocular Movement      Right Left    Full, Ortho Full, Ortho       Neuro/Psych    Oriented x3: Yes   Mood/Affect: Normal       Dilation    Both eyes: 1.0% Mydriacyl, 2.5% Phenylephrine @ 3:12 PM        Slit Lamp and Fundus Exam    Slit Lamp Exam      Right Left   Lids/Lashes Dermatochalasis - upper lid Dermatochalasis - upper lid   Conjunctiva/Sclera Mild Melanosis Mild Melanosis   Cornea Arcus, well healed temporal cataract wounds, trace Punctate epithelial erosions Mild Arcus, well healed temporal cataract wounds, trace Punctate epithelial erosions, +endopigment--Krukenberg spindle   Anterior Chamber Deep and quiet Deep and clear; no cell or pigment   Iris Round and dilated, No NVI Round and dilated, No NVI.  No TIDs.   Lens PC IOL in good position PC IOL in good position, ?tilt, mild pigment deposition   Vitreous Mild Vitreous syneresis Mild Vitreous syneresis       Fundus Exam      Right Left   Disc Pallor, Sharp rim, temporal PPA Pink and Sharp, mild PPA, focal fibrosis @ 0600.   C/D Ratio 0.2 0.2   Macula Flat, Blunted foveal reflex, Retinal pigment epithelial mottling, MA/IRH temporal macula. Flat, good foveal reflex, Retinal pigment epithelial mottling, scattered focal DBH greatest superior and temporal macula.  No edema.   Vessels Vascular attenuation Vascular attenuation, mild Tortuous, AV crossing changes   Periphery Attached, mild reticular degeneration.  Scattered MA/IRH. Attached, scattered  rare DBH          IMAGING AND PROCEDURES  Imaging and Procedures for @TODAY @  OCT, Retina - OU - Both Eyes       Right Eye Quality was good. Central Foveal Thickness: 263. Progression has been  stable. Findings include normal foveal contour, no IRF, no SRF, vitreomacular adhesion .   Left Eye Quality was good. Central Foveal Thickness: 271. Progression has been stable. Findings include normal foveal contour, no IRF, no SRF, vitreomacular adhesion  (Partial PVD).   Notes *Images captured and stored on drive  Diagnosis / Impression:  NFP, no IRF/SRF OU No DME OU  Clinical management:  See below  Abbreviations: NFP - Normal foveal profile. CME - cystoid macular edema. PED - pigment epithelial detachment. IRF - intraretinal fluid. SRF - subretinal fluid. EZ - ellipsoid zone. ERM - epiretinal membrane. ORA - outer retinal atrophy. ORT - outer retinal tubulation. SRHM - subretinal hyper-reflective material                 ASSESSMENT/PLAN:    ICD-10-CM   1. Pigment dispersion syndrome of left eye  H21.232   2. Diabetes mellitus type 2 without retinopathy ([place])  E11.9   3. Retinal edema  H35.81 OCT, Retina - OU - Both Eyes  4. Essential hypertension  I10   5. Hypertensive retinopathy of both eyes  H35.033   6. Pseudophakia of both eyes  Z96.1    1. Pigment dispersion syndrome OS  - BCVA 20/30 OS today  - exam shows scattered fine pigment in [facility] and ant vit + Krukenberg spindle -- stable from prior  - IOL OS with mild tilt -- ?etiology of PDS  - IOP okay at 14  - discussed findings, prognosis             - Followed closely by Dr. Elliot Dally  2. 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   - The need for close monitoring of blood glucose, blood pressure, and serum lipids, avoiding cigarette or any type of tobacco,  and the need for long term follow up was also discussed with patient. - exam shows MA/IRH OD; DBH OS - OCT without diabetic macular edema - F/u in 6 mos  3,4. Hypertensive retinopathy OU  - discussed importance of tight BP control  - monitor  5. Pseudophakia OU  - s/p CE/IOL both eyes [facility])  - doing well  - ? Mild tilt of PCIOL OS  - monitor  Ophthalmic Meds Ordered this visit:  No orders of the defined types were placed in this encounter.     Return in about 6 months (around 02/09/2021) for 6 mo f/u for mild NPDR OU w/DFE/OCT.  There are no Patient Instructions on file for this visit.  Explained the diagnoses, plan, and follow up with the patient and they expressed understanding.  Patient expressed understanding of the importance of proper follow up care.   This document serves as a record of services personally performed by Gardiner Sleeper, MD, PhD. It was created on their behalf by Roselee Nova, COMT. The creation of this record is the provider's dictation and/or activities during the visit.  Electronically signed by: Roselee Nova, COMT 08/14/20 11:30 PM  This document serves as a record of services personally performed by Gardiner Sleeper, MD, PhD. It was created on their behalf by Estill Bakes, COT an ophthalmic technician. The creation of this record is the provider's dictation and/or activities during the visit.    Electronically signed by: Estill Bakes, COT 12.15.21 @ 11:30 PM  Gardiner Sleeper, M.D., Ph.D. Diseases & Surgery of the Retina and  Vitreous [facility] 12.15.21  I have reviewed the above documentation for accuracy and completeness, and I agree with the above. Gardiner Sleeper, M.D., Ph.D. 08/14/20 11:30 PM    Abbreviations: M myopia (nearsighted); A astigmatism; H hyperopia (farsighted); P presbyopia; Mrx spectacle prescription;  CTL contact lenses; OD right eye; OS left eye; OU both eyes  XT exotropia; ET esotropia; PEK  punctate epithelial keratitis; PEE punctate epithelial erosions; DES dry eye syndrome; MGD meibomian gland dysfunction; ATs artificial tears; PFAT's preservative free artificial tears; [place] nuclear sclerotic cataract; PSC posterior subcapsular cataract; ERM epi-retinal membrane; PVD posterior vitreous detachment; RD retinal detachment; DM diabetes mellitus; DR diabetic retinopathy; NPDR non-proliferative diabetic retinopathy; PDR proliferative diabetic retinopathy; CSME clinically significant macular edema; DME diabetic macular edema; dbh dot blot hemorrhages; CWS cotton wool spot; POAG primary open angle glaucoma; C/D cup-to-disc ratio; HVF humphrey visual field; GVF goldmann visual field; OCT optical coherence tomography; IOP intraocular pressure; BRVO Branch retinal vein occlusion; CRVO central retinal vein occlusion; CRAO central retinal artery occlusion; BRAO branch retinal artery occlusion; RT retinal tear; SB scleral buckle; PPV pars plana vitrectomy; VH Vitreous hemorrhage; PRP panretinal laser photocoagulation; IVK intravitreal kenalog; VMT vitreomacular traction; MH Macular hole;  NVD neovascularization of the disc; NVE neovascularization elsewhere; AREDS age related eye disease study; ARMD age related macular degeneration; POAG primary open angle glaucoma; EBMD epithelial/anterior basement membrane dystrophy; ACIOL anterior chamber intraocular lens; IOL intraocular lens; PCIOL posterior chamber intraocular lens; Phaco/IOL phacoemulsification with intraocular lens placement; [place] photorefractive keratectomy; LASIK laser assisted in situ keratomileusis; HTN hypertension; DM diabetes mellitus; COPD chronic obstructive pulmonary disease

## 2020-08-11 ENCOUNTER — Encounter (INDEPENDENT_AMBULATORY_CARE_PROVIDER_SITE_OTHER): Payer: Self-pay | Admitting: Ophthalmology

## 2020-08-11 ENCOUNTER — Ambulatory Visit (INDEPENDENT_AMBULATORY_CARE_PROVIDER_SITE_OTHER): Payer: Medicare HMO | Admitting: Ophthalmology

## 2020-08-11 ENCOUNTER — Other Ambulatory Visit: Payer: Self-pay

## 2020-08-11 DIAGNOSIS — I10 Essential (primary) hypertension: Secondary | ICD-10-CM | POA: Diagnosis not present

## 2020-08-11 DIAGNOSIS — H3581 Retinal edema: Secondary | ICD-10-CM

## 2020-08-11 DIAGNOSIS — E119 Type 2 diabetes mellitus without complications: Secondary | ICD-10-CM

## 2020-08-11 DIAGNOSIS — H21232 Degeneration of iris (pigmentary), left eye: Secondary | ICD-10-CM | POA: Diagnosis not present

## 2020-08-11 DIAGNOSIS — Z961 Presence of intraocular lens: Secondary | ICD-10-CM

## 2020-08-11 DIAGNOSIS — H35033 Hypertensive retinopathy, bilateral: Secondary | ICD-10-CM

## 2020-08-12 LAB — HEMOGLOBIN A1C: Hemoglobin A1C: 12.4

## 2020-08-14 ENCOUNTER — Encounter (INDEPENDENT_AMBULATORY_CARE_PROVIDER_SITE_OTHER): Payer: Self-pay | Admitting: Ophthalmology

## 2020-08-25 ENCOUNTER — Ambulatory Visit: Payer: Medicare HMO | Admitting: "Endocrinology

## 2020-08-26 ENCOUNTER — Ambulatory Visit (INDEPENDENT_AMBULATORY_CARE_PROVIDER_SITE_OTHER): Payer: Medicare HMO | Admitting: "Endocrinology

## 2020-08-26 ENCOUNTER — Encounter: Payer: Self-pay | Admitting: "Endocrinology

## 2020-08-26 ENCOUNTER — Other Ambulatory Visit: Payer: Self-pay

## 2020-08-26 VITALS — BP 150/72 | HR 88 | Ht 68.0 in | Wt 198.6 lb

## 2020-08-26 DIAGNOSIS — E1165 Type 2 diabetes mellitus with hyperglycemia: Secondary | ICD-10-CM

## 2020-08-26 MED ORDER — BD PEN NEEDLE SHORT U/F 31G X 8 MM MISC: 1.0000 | 3 refills | Status: DC

## 2020-08-26 NOTE — Progress Notes (Signed)
Endocrinology Consult Note       08/26/2020, 3:01 PM   Subjective:    Patient ID: Nicole Golden, female    DOB: 1961-03-18.  Nicole Golden is being seen in consultation for management of currently uncontrolled symptomatic diabetes requested by  [name], DO.   Past Medical History:  Diagnosis Date  . Diabetes mellitus without complication ([place])   . Diabetic neuropathy ([place])   . Diabetic neuropathy ([place])   . Hypertension   . Hypertensive retinopathy    OU    Past Surgical History:  Procedure Laterality Date  . CATARACT EXTRACTION Bilateral   . EYE SURGERY Bilateral    Cat Sx  . FOOT SURGERY     right foot  . TUBAL LIGATION      Social History   Socioeconomic History  . Marital status: Single    Spouse name: Not on file  . Number of children: Not on file  . Years of education: Not on file  . Highest education level: Not on file  Occupational History  . Occupation: [employer]    Comment: in [place], first shift   Tobacco Use  . Smoking status: Former Research scientist (life sciences)  . Smokeless tobacco: Never Used  Vaping Use  . Vaping Use: Never used  Substance and Sexual Activity  . Alcohol use: Yes    Comment: occas  . Drug use: Never  . Sexual activity: Not on file  Other Topics Concern  . Not on file  Social History Narrative  . Not on file   Social Determinants of Health   Financial Resource Strain: Not on file  Food Insecurity: Not on file  Transportation Needs: Not on file  Physical Activity: Not on file  Stress: Not on file  Social Connections: Not on file    Family History  Problem Relation Age of Onset  . Breast cancer Paternal Aunt   . Diabetes Father   . Colon cancer Neg Hx   . Colon polyps Neg Hx     Outpatient Encounter Medications as of 08/26/2020  Medication Sig  . amLODipine (NORVASC) 2.5 MG tablet Take 2.5 mg by mouth daily.  Marland Kitchen aspirin EC 81 MG tablet Take 81 mg by  mouth daily. Swallow whole.  . Insulin Pen Needle (B-D ULTRAFINE III SHORT PEN) 31G X 8 MM MISC 1 each by Does not apply route as directed.  Marland Kitchen losartan-hydrochlorothiazide (HYZAAR) 100-12.5 MG tablet Take 1 tablet by mouth daily.  . rosuvastatin (CRESTOR) 40 MG tablet Take 40 mg by mouth daily.  . [DISCONTINUED] cetirizine (ZYRTEC) 10 MG tablet Take 10 mg by mouth daily.  . [DISCONTINUED] empagliflozin (JARDIANCE) 25 MG TABS tablet Take 25 mg by mouth daily.  . [DISCONTINUED] terbinafine (LAMISIL) 250 MG tablet Take 250 mg by mouth daily.  Marland Kitchen ACCU-CHEK AVIVA PLUS test strip   . acetaminophen (TYLENOL) 500 MG tablet Take 500 mg by mouth every 6 (six) hours as needed for fever.  Marland Kitchen atropine 1 % ophthalmic solution Place 1 drop into the left eye 2 (two) times daily.  . dorzolamide-timolol (COSOPT) 22.3-6.8 MG/ML ophthalmic solution Place 1 drop into the left  eye 2 (two) times daily.  . furosemide (LASIX) 20 MG tablet Take 20 mg by mouth daily as needed for edema.  Marland Kitchen LANTUS SOLOSTAR 100 UNIT/ML Solostar Pen Inject 60 Units into the skin at bedtime.  Marland Kitchen latanoprost (XALATAN) 0.005 % ophthalmic solution 1 drop daily.  Marland Kitchen NOVOLOG FLEXPEN 100 UNIT/ML FlexPen Inject 10-16 Units into the skin 3 (three) times daily before meals.  . prednisoLONE acetate (PRED FORTE) 1 % ophthalmic suspension Place 1 drop into the left eye 3 (three) times daily.  Marland Kitchen SANTYL ointment   . [DISCONTINUED] ALAWAY 0.025 % ophthalmic solution Place 1 drop into both eyes daily as needed for dry eyes.  . [DISCONTINUED] BD PEN NEEDLE NANO 2ND GEN 32G X 4 MM MISC   . [DISCONTINUED] hydrochlorothiazide (HYDRODIURIL) 25 MG tablet Take 25 mg by mouth daily.  . [DISCONTINUED] losartan (COZAAR) 25 MG tablet Take 25 mg by mouth daily.  . [DISCONTINUED] lovastatin (MEVACOR) 20 MG tablet Take 20 mg by mouth at bedtime.  . [DISCONTINUED] rosuvastatin (CRESTOR) 10 MG tablet Take 10 mg by mouth daily.   No facility-administered encounter medications  on file as of 08/26/2020.    ALLERGIES: No Known Allergies  VACCINATION STATUS:  There is no immunization history on file for this patient.  Diabetes She presents for her initial diabetic visit. She has type 2 diabetes mellitus. Onset time: She was diagnosed at approximate age of 89 years.  She did have gestational diabetes less than a year prior to the diagnosis. Her disease course has been worsening (She did have an episode of DKA in 2017 when her A1c was 15.3%.   Reportedly, she was told to have type 1 diabetes instead of type 2 recently,  despite the fact that she was treated with oral medications for decades.). There are no hypoglycemic associated symptoms. Pertinent negatives for hypoglycemia include no confusion, headaches, pallor or seizures. Associated symptoms include blurred vision, fatigue, foot paresthesias, foot ulcerations, polydipsia and polyuria. Pertinent negatives for diabetes include no chest pain and no polyphagia. There are no hypoglycemic complications. Symptoms are worsening. Diabetic complications include retinopathy. Risk factors for coronary artery disease include dyslipidemia, diabetes mellitus, hypertension, tobacco exposure, post-menopausal and sedentary lifestyle. Current diabetic treatment includes insulin injections (She is on Lantus 60 units twice daily, NovoLog 10 units 3 times daily AC.). Her weight is fluctuating minimally. She is following a generally unhealthy diet. When asked about meal planning, she reported none. She has not had a previous visit with a dietitian. She rarely participates in exercise. (She did not bring any logs nor meter with her today.  Her recent A1c of 12.4%, prior to which she was 15+ percent.) An ACE inhibitor/angiotensin II receptor blocker is being taken. Eye exam is current.  Hypertension Associated symptoms include blurred vision. Pertinent negatives include no chest pain, headaches, palpitations or shortness of breath. Hypertensive  end-organ damage includes retinopathy.  Hyperlipidemia Pertinent negatives include no chest pain, myalgias or shortness of breath.     Review of Systems  Constitutional: Positive for fatigue. Negative for chills, fever and unexpected weight change.  HENT: Negative for trouble swallowing and voice change.   Eyes: Positive for blurred vision. Negative for visual disturbance.  Respiratory: Negative for cough, shortness of breath and wheezing.   Cardiovascular: Negative for chest pain, palpitations and leg swelling.  Gastrointestinal: Negative for diarrhea, nausea and vomiting.  Endocrine: Positive for polydipsia and polyuria. Negative for cold intolerance, heat intolerance and polyphagia.  Musculoskeletal: Negative for arthralgias and myalgias.  Skin: Negative for color change, pallor, rash and wound.  Neurological: Negative for seizures and headaches.  Psychiatric/Behavioral: Negative for confusion and suicidal ideas.    Objective:    Vitals with BMI 08/26/2020 03/25/2020 03/24/2020  Height 5\' 8"  5\' 8"  -  Weight 198 lbs 10 oz 189 lbs -  BMI 30.2 28.74 -  Systolic 150 158 -  Diastolic 72 78 -  Pulse 88 80 81    BP (!) 150/72   Pulse 88   Ht 5\' 8"  (1.727 m)   Wt 198 lb 9.6 oz (90.1 kg)   BMI 30.20 kg/m   Wt Readings from Last 3 Encounters:  08/26/20 198 lb 9.6 oz (90.1 kg)  03/25/20 189 lb (85.7 kg)  03/23/20 188 lb (85.3 kg)     Physical Exam Constitutional:      Appearance: She is well-developed.  HENT:     Head: Normocephalic and atraumatic.  Eyes:     Extraocular Movements: EOM normal.  Neck:     Thyroid: No thyromegaly.     Trachea: No tracheal deviation.  Cardiovascular:     Rate and Rhythm: Normal rate and regular rhythm.  Pulmonary:     Effort: Pulmonary effort is normal.  Abdominal:     Tenderness: There is no abdominal tenderness. There is no guarding.  Musculoskeletal:        General: No edema. Normal range of motion.     Cervical back: Normal range  of motion and neck supple.     Comments: Callus on left lower extremity.  Skin:    General: Skin is warm and dry.     Coloration: Skin is not pale.     Findings: No erythema or rash.  Neurological:     Mental Status: She is alert and oriented to person, place, and time.     Cranial Nerves: No cranial nerve deficit.     Coordination: Coordination normal.     Deep Tendon Reflexes: Reflexes are normal and symmetric.  Psychiatric:        Mood and Affect: Mood and affect normal.        Judgment: Judgment normal.       CMP ( most recent) CMP     Component Value Date/Time   NA 138 03/23/2020 2144   K 4.0 03/23/2020 2144   CL 104 03/23/2020 2144   CO2 24 03/23/2020 2144   GLUCOSE 317 (H) 03/23/2020 2144   BUN 20 03/23/2020 2144   CREATININE 0.95 03/23/2020 2144   CALCIUM 9.0 03/23/2020 2144   PROT 6.7 03/23/2020 2144   ALBUMIN 3.4 (L) 03/23/2020 2144   AST 27 03/23/2020 2144   ALT 32 03/23/2020 2144   ALKPHOS 86 03/23/2020 2144   BILITOT 0.7 03/23/2020 2144   GFRNONAA >60 03/23/2020 2144   GFRAA >60 03/23/2020 2144     Diabetic Labs (most recent): Lab Results  Component Value Date   HGBA1C 12.4 08/12/2020   HGBA1C 15.3 (H) 04/24/2016     Assessment & Plan:   1. Uncontrolled type 2 diabetes mellitus with hyperglycemia ([facility])  - Nicole Golden has currently uncontrolled symptomatic type 2 DM since  59 years of age,  with most recent A1c of 12.4 %. Recent labs reviewed.  She did have A1c as high as 15.3% in the past.  She reports that she was told to have type 1 instead of type 2 recently.  Review of her medical records indicate presentation in August 2017 with hyperglycemia and metabolic acidosis  likely DKA.  However as when she had A1c of 15.3%, DKA likely as a result of type 2 diabetes.  She was treated with oral medications along with her insulin over the decades. She will need more specific studies to reclassify appropriately when she approaches target A1c. -At this  time, she will continue to need intensive treatment with basal/bolus insulin in order for her to achieve and maintain control of diabetes to target.  - I had a long discussion with her about the progressive nature of diabetes and the pathology behind its complications. -her diabetes is complicated by peripheral neuropathy, history of DKA, retinopathy and she remains at a high risk for more acute and chronic complications which include CAD, CVA, CKD, retinopathy, and neuropathy. These are all discussed in detail with her.  - I have counseled her on diet  and weight management  by adopting a carbohydrate restricted/protein rich diet. Patient is encouraged to switch to  unprocessed or minimally processed     complex starch and increased protein intake (animal or plant source), fruits, and vegetables. -  she is advised to stick to a routine mealtimes to eat 3 meals  a day and avoid unnecessary snacks ( to snack only to correct hypoglycemia).   - she acknowledges that there is a room for improvement in her food and drink choices. - Suggestion is made for her to avoid simple carbohydrates  from her diet including Cakes, Sweet Desserts, Ice Cream, Soda (diet and regular), Sweet Tea, Candies, Chips, Cookies, Store Bought Juices, Alcohol in Excess of  1-2 drinks a day, Artificial Sweeteners,  Coffee Creamer, and "Sugar-free" Products. This will help patient to have more stable blood glucose profile and potentially avoid unintended weight gain.  - she will be scheduled with Jearld Fenton, RDN, CDE for diabetes education.  - I have approached her with the following individualized plan to manage  her diabetes and patient agrees:   -In light of her presentation with significant glycemic burden chronically, she will continue to need intensive treatment with basal/bolus insulin in order for her to achieve and maintain control of diabetes to target. -To avoid hypoglycemia from inadvertent use of insulin, she is  advised to lower her Lantus to 60 units  daily at bedtime , and adjust her prandial insulin NovoLog to 10 units 3 times a day with meals  for pre-meal BG readings of 90-150mg /dl, plus patient specific correction dose for unexpected hyperglycemia above 150mg /dl, associated with strict monitoring of glucose 4 times a day-before meals and at bedtime. - she is warned not to take insulin without proper monitoring per orders. -This patient will benefit from a CGM.  She will be considered for the freestyle libre device on next visit. - Adjustment parameters are given to her for hypo and hyperglycemia in writing. - she is encouraged to call clinic for blood glucose levels less than 70 or above 300 mg /dl. -She is advised to stay off of oral medications at this time. -If her subsequent work-up indicates type 2 diabetes instead of type 1, she will be reconsidered for other modalities of treatment including low-dose Metformin, incretin therapy.  - Specific targets for  A1c;  LDL, HDL,  and Triglycerides were discussed with the patient.  2) Blood Pressure /Hypertension:  her blood pressure is not controlled to target.   she is advised to continue her current medications including amlodipine 2.5 mg p.o. daily, Lasix as needed, losartan/HCTZ 100/12.5 mg p.o. daily.   3) Lipids/Hyperlipidemia: No recent  lipid panel to review.  She is advised to continue Crestor 40 mg p.o. nightly.    4)  Weight/Diet:  Body mass index is 30.2 kg/m.  -   clearly complicating her diabetes care.   she is  a candidate for weight loss. I discussed with her the fact that loss of 5 - 10% of her  current body weight will have the most impact on her diabetes management.  Exercise, and detailed carbohydrates information provided  -  detailed on discharge instructions.  5) Chronic Care/Health Maintenance:  -she  is on ACEI/ARB and Statin medications and  is encouraged to initiate and continue to follow up with Ophthalmology, Dentist,   Podiatrist at least yearly or according to recommendations, and advised to   stay away from smoking. I have recommended yearly flu vaccine and pneumonia vaccine at least every 5 years; moderate intensity exercise for up to 150 minutes weekly; and  sleep for at least 7 hours a day.  - she is  advised to maintain close follow up with [name], DO for primary care needs, as well as her other providers for optimal and coordinated care.   - Time spent in this patient care: 60 min, of which > 50% was spent in  counseling  her about her chronically uncontrolled, complicated type 2 diabetes, hyperlipidemia, hypertension and the rest reviewing her blood glucose logs , discussing her hypoglycemia and hyperglycemia episodes, reviewing her current and  previous labs / studies  ( including abstraction from other facilities) and medications  doses and developing a  long term treatment plan based on the latest standards of care/ guidelines; and documenting her care.    Please refer to Patient Instructions for Blood Glucose Monitoring and Insulin/Medications Dosing Guide"  in media tab for additional information. Please  also refer to " Patient Self Inventory" in the Media  tab for reviewed elements of pertinent patient history.  Nicole Golden participated in the discussions, expressed understanding, and voiced agreement with the above plans.  All questions were answered to her satisfaction. she is encouraged to contact clinic should she have any questions or concerns prior to her return visit.   Follow up plan: - Return in about 10 days (around 09/05/2020) for F/U with Meter and Logs Only - no Labs.  Glade Lloyd, MD [facility] [address] [place], [place] [address] Phone: [phone]  Fax: [phone]    08/26/2020, 3:01 PM  This note was partially dictated with voice recognition software. Similar sounding words can be transcribed inadequately or  may not  be corrected upon review.

## 2020-08-26 NOTE — Patient Instructions (Signed)
                                     Advice for Weight Management  -For most of us the best way to lose weight is by diet management. Generally speaking, diet management means consuming less calories intentionally which over time brings about progressive weight loss.  This can be achieved more effectively by restricting carbohydrate consumption to the minimum possible.  So, it is critically important to know your numbers: how much calorie you are consuming and how much calorie you need. More importantly, our carbohydrates sources should be unprocessed or minimally processed complex starch food items.   Sometimes, it is important to balance nutrition by increasing protein intake (animal or plant source), fruits, and vegetables.  -Sticking to a routine mealtime to eat 3 meals a day and avoiding unnecessary snacks is shown to have a big role in weight control. Under normal circumstances, the only time we lose real weight is when we are hungry, so allow hunger to take place- hunger means no food between meal times, only water.  It is not advisable to starve.   -It is better to avoid simple carbohydrates including: Cakes, Sweet Desserts, Ice Cream, Soda (diet and regular), Sweet Tea, Candies, Chips, Cookies, Store Bought Juices, Alcohol in Excess of  1-2 drinks a day, Lemonade,  Artificial Sweeteners, Doughnuts, Coffee Creamers, "Sugar-free" Products, etc, etc.  This is not a complete list.....    -Consulting with certified diabetes educators is proven to provide you with the most accurate and current information on diet.  Also, you may be  interested in discussing diet options/exchanges , we can schedule a visit with Nicole Golden, RDN, CDE for individualized nutrition education.  -Exercise: If you are able: 30 -60 minutes a day ,4 days a week, or 150 minutes a week.  The longer the better.  Combine stretch, strength, and aerobic activities.  If you were told in the  past that you have high risk for cardiovascular diseases, you may seek evaluation by your heart doctor prior to initiating moderate to intense exercise programs.                                  Additional Care Considerations for Diabetes   -Diabetes  is a chronic disease.  The most important care consideration is regular follow-up with your diabetes care provider with the goal being avoiding or delaying its complications and to take advantage of advances in medications and technology.    -Type 2 diabetes is known to coexist with other important comorbidities such as high blood pressure and high cholesterol.  It is critical to control not only the diabetes but also the high blood pressure and high cholesterol to minimize and delay the risk of complications including coronary artery disease, stroke, amputations, blindness, etc.    - Studies showed that people with diabetes will benefit from a class of medications known as ACE inhibitors and statins.  Unless there are specific reasons not to be on these medications, the standard of care is to consider getting one from these groups of medications at an optimal doses.  These medications are generally considered safe and proven to help protect the heart and the kidneys.    - People with diabetes are encouraged to initiate and maintain regular follow-up with eye doctors, foot   doctors, dentists , and if necessary heart and kidney doctors.     - It is highly recommended that people with diabetes quit smoking or stay away from smoking, and get yearly  flu vaccine and pneumonia vaccine at least every 5 years.  One other important lifestyle recommendation is to ensure adequate sleep - at least 6-7 hours of uninterrupted sleep at night.  -Exercise: If you are able: 30 -60 minutes a day, 4 days a week, or 150 minutes a week.  The longer the better.  Combine stretch, strength, and aerobic activities.  If you were told in the past that you have high risk for  cardiovascular diseases, you may seek evaluation by your heart doctor prior to initiating moderate to intense exercise programs.          

## 2020-09-06 ENCOUNTER — Ambulatory Visit: Payer: Medicare HMO | Admitting: "Endocrinology

## 2020-09-07 ENCOUNTER — Ambulatory Visit: Payer: Medicare HMO | Admitting: "Endocrinology

## 2020-09-24 ENCOUNTER — Other Ambulatory Visit: Payer: Self-pay

## 2020-09-24 ENCOUNTER — Encounter (HOSPITAL_BASED_OUTPATIENT_CLINIC_OR_DEPARTMENT_OTHER): Payer: Medicare HMO | Attending: Internal Medicine | Admitting: Internal Medicine

## 2020-09-24 DIAGNOSIS — Z87891 Personal history of nicotine dependence: Secondary | ICD-10-CM | POA: Diagnosis not present

## 2020-09-24 DIAGNOSIS — E11621 Type 2 diabetes mellitus with foot ulcer: Secondary | ICD-10-CM | POA: Diagnosis present

## 2020-09-24 DIAGNOSIS — L97512 Non-pressure chronic ulcer of other part of right foot with fat layer exposed: Secondary | ICD-10-CM | POA: Insufficient documentation

## 2020-09-24 DIAGNOSIS — I10 Essential (primary) hypertension: Secondary | ICD-10-CM | POA: Diagnosis not present

## 2020-09-24 DIAGNOSIS — E1142 Type 2 diabetes mellitus with diabetic polyneuropathy: Secondary | ICD-10-CM | POA: Insufficient documentation

## 2020-09-24 NOTE — Progress Notes (Signed)
Nicole Golden, Nicole Golden ([id number]) Visit Report for 09/24/2020 Abuse/Suicide Risk Screen Details Patient Name: Date of Service: Nicole Golden 09/24/2020 2:45 PM Medical Record Number: [id number] Patient Account Number: [id number] Date of Birth/Sex: Treating RN: 01/05/1961 (60 y.o. Nicole Golden Primary Care Nicole Golden: PA TEL, Nicole Golden Other Clinician: Referring Nicole Golden: Treating Nicole Golden/Extender: Nicole Ham PA TEL, Nicole Golden in Treatment: 0 Abuse/Suicide Risk Screen Items Answer ABUSE RISK SCREEN: Has anyone close to you tried to hurt or harm you recentlyo No Do you feel uncomfortable with anyone in your familyo No Has anyone forced you do things that you didnt want to doo No Electronic Signature(s) Signed: 09/24/2020 5:52:57 PM By: Levan Hurst RN, BSN Entered By: Levan Hurst on 09/24/2020 15:36:38 -------------------------------------------------------------------------------- Activities of Daily Living Details Patient Name: Date of Service: Nicole Golden 09/24/2020 2:45 PM Medical Record Number: [id number] Patient Account Number: [id number] Date of Birth/Sex: Treating RN: [birth year] (60 y.o. Nicole Golden Primary Care Nicole Golden: PA TEL, Nicole Golden Other Clinician: Referring Jadelyn Elks: Treating Nicole Golden/Extender: Nicole Ham PA TEL, Nicole Golden in Treatment: 0 Activities of Daily Living Items Answer Activities of Daily Living (Please select one for each item) Drive Automobile Completely Able T Medications ake Completely Able Use T elephone Completely Able Care for Appearance Completely Able Use T oilet Completely Able Bath / Shower Completely Able Dress Self Completely Able Feed Self Completely Able Walk Completely Able Get In / Out Bed Completely Able Housework Completely Able Prepare Meals Completely [place] for Self Completely Able Electronic Signature(s) Signed: 09/24/2020 5:52:57 PM By: Levan Hurst RN,  BSN Entered By: Levan Hurst on 09/24/2020 15:36:57 -------------------------------------------------------------------------------- Education Screening Details Patient Name: Date of Service: Nicole Golden. 09/24/2020 2:45 PM Medical Record Number: [id number] Patient Account Number: [id number] Date of Birth/Sex: Treating RN: 14-Feb-1961 (60 y.o. Nicole Golden Primary Care Smantha Boakye: PA TEL, Nicole Golden Other Clinician: Referring Nicole Golden: Treating Nicole Golden/Extender: Nicole Ham PA TEL, Nicole Golden in Treatment: 0 Primary Learner Assessed: Patient Learning Preferences/Education Level/Primary Language Learning Preference: Explanation, Demonstration, Printed Material Highest Education Level: High School Preferred Language: English Cognitive Barrier Language Barrier: No Translator Needed: No Memory Deficit: No Emotional Barrier: No Cultural/Religious Beliefs Affecting Medical Care: No Physical Barrier Impaired Vision: No Impaired Hearing: No Decreased Hand dexterity: No Knowledge/Comprehension Knowledge Level: High Comprehension Level: High Ability to understand written instructions: High Ability to understand verbal instructions: High Motivation Anxiety Level: Calm Cooperation: Cooperative Education Importance: Acknowledges Need Interest in Health Problems: Asks Questions Perception: Coherent Willingness to Engage in Self-Management High Activities: Readiness to Engage in Self-Management High Activities: Electronic Signature(s) Signed: 09/24/2020 5:52:57 PM By: Levan Hurst RN, BSN Entered By: Levan Hurst on 09/24/2020 15:37:22 -------------------------------------------------------------------------------- Fall Risk Assessment Details Patient Name: Date of Service: Nicole Jester J. 09/24/2020 2:45 PM Medical Record Number: [id number] Patient Account Number: [id number] Date of Birth/Sex: Treating RN: Jan 26, 1961 (59 y.o. Nicole Golden Primary Care  Nicole Golden: PA TEL, Nicole Golden Other Clinician: Referring Bryella Diviney: Treating Nicole Golden/Extender: Nicole Ham PA TEL, Nicole Golden in Treatment: 0 Fall Risk Assessment Items Have you had 2 or more falls in the last 12 monthso 0 No Have you had any fall that resulted in injury in the last 12 monthso 0 No FALLS RISK SCREEN History of falling - immediate or within 3 months 0 No Secondary diagnosis (Do you have 2 or more medical diagnoseso) 15 Yes Ambulatory aid None/bed rest/wheelchair/nurse 0 Yes Crutches/cane/walker 0 No  Furniture 0 No Intravenous therapy Access/Saline/Heparin Lock 0 No Gait/Transferring Normal/ bed rest/ wheelchair 0 Yes Weak (short steps with or without shuffle, stooped but able to lift head while walking, may seek 0 No support from furniture) Impaired (short steps with shuffle, may have difficulty arising from chair, head down, impaired 0 No balance) Mental Status Oriented to own ability 0 Yes Electronic Signature(s) Signed: 09/24/2020 5:52:57 PM By: Levan Hurst RN, BSN Entered By: Levan Hurst on 09/24/2020 15:37:38 -------------------------------------------------------------------------------- Foot Assessment Details Patient Name: Date of Service: Nicole Jester J. 09/24/2020 2:45 PM Medical Record Number: [id number] Patient Account Number: [id number] Date of Birth/Sex: Treating RN: [birth year] (60 y.o. Nicole Golden Primary Care Nicole Golden: PA TEL, Nicole Golden Other Clinician: Referring Nicole Golden: Treating Nicole Golden/Extender: Nicole Ham PA TEL, Nicole Golden in Treatment: 0 Foot Assessment Items Site Locations + = Sensation present, - = Sensation absent, C = Callus, U = Ulcer R = Redness, W = Warmth, M = Maceration, PU = Pre-ulcerative lesion F = Fissure, S = Swelling, D = Dryness Assessment Right: Left: Other Deformity: No No Prior Foot Ulcer: No No Prior Amputation: No No Charcot Joint: No No Ambulatory Status: Ambulatory Without Help Gait:  Steady Electronic Signature(s) Signed: 09/24/2020 5:52:57 PM By: Levan Hurst RN, BSN Entered By: Levan Hurst on 09/24/2020 15:45:33 -------------------------------------------------------------------------------- Nutrition Risk Screening Details Patient Name: Date of Service: Nicole Golden 09/24/2020 2:45 PM Medical Record Number: [id number] Patient Account Number: [id number] Date of Birth/Sex: Treating RN: 12/16/1960 (60 y.o. Nicole Golden Primary Care Amaryah Mallen: PA TEL, Nicole Golden Other Clinician: Referring Daiton Cowles: Treating Isra Lindy/Extender: Nicole Ham PA TEL, Nicole Golden in Treatment: 0 Height (in): 68 Weight (lbs): 200 Body Mass Index (BMI): 30.4 Nutrition Risk Screening Items Score Screening NUTRITION RISK SCREEN: I have an illness or condition that made me change the kind and/or amount of food I eat 2 Yes I eat fewer than two meals per day 0 No I eat few fruits and vegetables, or milk products 0 No I have three or more drinks of beer, liquor or wine almost every day 0 No I have tooth or mouth problems that make it hard for me to eat 0 No I don't always have enough money to buy the food I need 0 No I eat alone most of the time 0 No I take three or more different prescribed or over-the-counter drugs a day 1 Yes Without wanting to, I have lost or gained 10 pounds in the last six months 0 No I am not always physically able to shop, cook and/or feed myself 0 No Nutrition Protocols Good Risk Protocol Moderate Risk Protocol 0 Provide education on nutrition High Risk Proctocol Risk Level: Moderate Risk Score: 3 Electronic Signature(s) Signed: 09/24/2020 5:52:57 PM By: Levan Hurst RN, BSN Entered By: Levan Hurst on 09/24/2020 15:37:52

## 2020-09-24 NOTE — Progress Notes (Addendum)
GEANA, LELLA ([id number]) Visit Report for 09/24/2020 Chief Complaint Document Details Patient Name: Date of Service: Nicole Golden 09/24/2020 2:45 PM Medical Record Number: [id number] Patient Account Number: [id number] Date of Birth/Sex: Treating RN: 1960/12/29 (60 y.o. Nicole Golden Primary Care Provider: PA TEL, Darel Hong Other Clinician: Referring Provider: Treating Provider/Extender: Linton Ham PA TEL, Ena Dawley in Treatment: 0 Information Obtained from: Patient Chief Complaint 09/24/2020; patient is here for review of a ulcer on her right plantar first metatarsal head Electronic Signature(s) Signed: 09/24/2020 5:33:44 PM By: Linton Ham MD Entered By: Linton Ham on 09/24/2020 16:56:57 -------------------------------------------------------------------------------- Debridement Details Patient Name: Date of Service: Nicole Jester J. 09/24/2020 2:45 PM Medical Record Number: [id number] Patient Account Number: [id number] Date of Birth/Sex: Treating RN: [birth year] (60 y.o. Nicole Golden Primary Care Provider: PA TEL, Darel Hong Other Clinician: Referring Provider: Treating Provider/Extender: Linton Ham PA TEL, Ena Dawley in Treatment: 0 Debridement Performed for Assessment: Wound #1 Right Metatarsal head first Performed By: Physician Ricard Dillon., MD Debridement Type: Debridement Severity of Tissue Pre Debridement: Fat layer exposed Level of Consciousness (Pre-procedure): Awake and Alert Pre-procedure Verification/Time Out Yes - 16:25 Taken: Start Time: 16:25 T Area Debrided (L x W): otal 2 (cm) x 2 (cm) = 4 (cm) Tissue and other material debrided: Viable, Non-Viable, Callus, Slough, Subcutaneous, Skin: Epidermis, Slough Level: Skin/Subcutaneous Tissue Debridement Description: Excisional Instrument: Curette Bleeding: Minimum Hemostasis Achieved: Pressure End Time: 16:34 Response to Treatment: Procedure was tolerated well Level  of Consciousness (Post- Awake and Alert procedure): Post Debridement Measurements of Total Wound Length: (cm) 1.3 Width: (cm) 0.8 Depth: (cm) 0.2 Volume: (cm) 0.163 Character of Wound/Ulcer Post Debridement: Improved Severity of Tissue Post Debridement: Fat layer exposed Post Procedure Diagnosis Same as Pre-procedure Electronic Signature(s) Signed: 09/24/2020 5:33:44 PM By: Linton Ham MD Signed: 09/24/2020 5:52:16 PM By: Baruch Gouty RN, BSN Entered By: Linton Ham on 09/24/2020 16:54:05 -------------------------------------------------------------------------------- HPI Details Patient Name: Date of Service: Nicole Dike, [name] 09/24/2020 2:45 PM Medical Record Number: [id number] Patient Account Number: [id number] Date of Birth/Sex: Treating RN: December 25, 1960 (60 y.o. Nicole Golden Primary Care Provider: PA TEL, Darel Hong Other Clinician: Referring Provider: Treating Provider/Extender: Linton Ham PA TEL, Ena Dawley in Treatment: 0 History of Present Illness HPI Description: Admission 09/24/2020 This is a 60 year old woman with type 2 diabetes poorly controlled and polyneuropathy. She has been been dealing with an area on her right first plantar metatarsal head for about 2 months. She has been following at [facility] using Santyl and callus debridements. 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 She has been using Santyl for 2 months she has had x-rays done in the podiatrist office there was apparently nothing noted although I am not able to review this. She has not been on antibiotics. Looking through [facility] health link she has had wounds on both feet in the past for which she is followed with Dr.Petery at [facility]. Past medical history includes type 2 diabetes with most recent hemoglobin A1c of 12.4, diabetic peripheral neuropathy, hypertension,  hyperlipidemia, lichen simplex chronicus ABI in our clinic was 0.91 on the right. She is a non-smoker Engineer, maintenance) Signed: 09/24/2020 5:33:44 PM By: Linton Ham MD Entered By: Linton Ham on 09/24/2020 16:59:37 -------------------------------------------------------------------------------- Physical Exam Details Patient Name: Date of Service: Nicole Dike,  [name] 09/24/2020 2:45 PM Medical Record Number: [id number] Patient Account Number: [id number] Date of Birth/Sex: Treating RN: 1961/06/20 (60 y.o. Nicole Golden Primary Care Provider: PA TEL, Darel Hong Other Clinician: Referring Provider: Treating Provider/Extender: Linton Ham PA TEL, Ena Dawley in Treatment: 0 Constitutional Patient is hypertensive.. Pulse regular and within target range for patient.Marland Kitchen Respirations regular, non-labored and within target range.. Temperature is normal and within the target range for the patient.Marland Kitchen Appears in no distress. Respiratory work of breathing is normal. Cardiovascular Dorsalis pedis pulses and posterior tibial were palpable on the right. Neurological Completely absent to the monofilament in the forefoot. Notes Wound exam; this areas over the right first metatarsal head. Small open area but in the middle of a thick mound of callus with clear circumferential undermining. Using pickups and a #15 scalpel the thick callus and subcutaneous tissue removed. Then with a #5 curette the surface of the remaining wound was debrided. There is really no evidence of infection here. No surrounding erythema no palpable bone. Electronic Signature(s) Signed: 09/24/2020 5:33:44 PM By: Linton Ham MD Entered By: Linton Ham on 09/24/2020 17:01:11 -------------------------------------------------------------------------------- Physician Orders Details Patient Name: Date of Service: Nicole Golden 09/24/2020 2:45 PM Medical Record Number: [id number] Patient Account Number:  [id number] Date of Birth/Sex: Treating RN: 1961-02-08 (60 y.o. Nicole Golden Primary Care Provider: PA TEL, Darel Hong Other Clinician: Referring Provider: Treating Provider/Extender: Linton Ham PA TEL, Ena Dawley in Treatment: 0 Verbal / Phone Orders: No Diagnosis Coding Follow-up Appointments Return Appointment in 1 week. Bathing/ Shower/ Hygiene May shower and wash wound with soap and water. - with dressing changes Off-Loading Wedge shoe to: - right foot when not at work Other: - felt callous pad Wound Treatment Wound #1 - Metatarsal head first Wound Laterality: Right Cleanser: Soap and Water 1 x Per Day/30 Days Discharge Instructions: May shower and wash wound with dial antibacterial soap and water prior to dressing change. Prim Dressing: KerraCel Ag Gelling Fiber Dressing, 2x2 in (silver alginate) (DME) (Generic) 1 x Per Day/30 Days ary Discharge Instructions: Apply silver alginate to wound bed as instructed Secondary Dressing: Woven Gauze Sponges 2x2 in (DME) (Generic) 1 x Per Day/30 Days Discharge Instructions: Apply over primary dressing as directed. Secondary Dressing: Optifoam Non-Adhesive Dressing, 4x4 in (DME) (Generic) 1 x Per Day/30 Days Discharge Instructions: Apply over primary dressing cut to make foam donut to help offload Secured With: Conforming Stretch Gauze Bandage, Sterile 2x75 (in/in) (DME) (Generic) 1 x Per Day/30 Days Discharge Instructions: Secure with stretch gauze as directed. Secured With: Paper Tape, 2x10 (in/yd) (DME) (Generic) 1 x Per Day/30 Days Discharge Instructions: Secure dressing with tape as directed. Electronic Signature(s) Signed: 09/24/2020 5:33:44 PM By: Linton Ham MD Signed: 09/24/2020 5:52:16 PM By: Baruch Gouty RN, BSN Entered By: Baruch Gouty on 09/24/2020 16:40:29 -------------------------------------------------------------------------------- Problem List Details Patient Name: Date of Service: Nicole Golden 09/24/2020 2:45 PM Medical Record Number: [id number] Patient Account Number: [id number] Date of Birth/Sex: Treating RN: December 18, 1960 (60 y.o. Nicole Golden Primary Care Provider: PA TEL, Darel Hong Other Clinician: Referring Provider: Treating Provider/Extender: Linton Ham PA TEL, Ena Dawley in Treatment: 0 Active Problems ICD-10 Encounter Code Description Active Date MDM Diagnosis E11.621 Type 2 diabetes mellitus with foot ulcer 09/24/2020 No Yes L97.512 Non-pressure chronic ulcer of other part of right foot with fat layer exposed 09/24/2020 No Yes E11.42 Type 2 diabetes mellitus with diabetic polyneuropathy 09/24/2020 No Yes Inactive Problems Resolved Problems  Electronic Signature(s) Signed: 09/24/2020 5:33:44 PM By: Linton Ham MD Entered By: Linton Ham on 09/24/2020 16:53:29 -------------------------------------------------------------------------------- Progress Note Details Patient Name: Date of Service: Nicole Golden 09/24/2020 2:45 PM Medical Record Number: [id number] Patient Account Number: [id number] Date of Birth/Sex: Treating RN: 1961/04/05 (60 y.o. Nicole Golden Primary Care Provider: PA TEL, Darel Hong Other Clinician: Referring Provider: Treating Provider/Extender: Linton Ham PA TEL, Ena Dawley in Treatment: 0 Subjective Chief Complaint Information obtained from Patient 09/24/2020; patient is here for review of a ulcer on her right plantar first metatarsal head History of Present Illness (HPI) Admission 09/24/2020 This is a 60 year old woman with type 2 diabetes poorly controlled and polyneuropathy. She has been been dealing with an area on her right first plantar metatarsal head for about 2 months. She has been following at [facility] using Santyl and callus debridements. 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 She has been  using Santyl for 2 months she has had x-rays done in the podiatrist office there was apparently nothing noted although I am not able to review this. She has not been on antibiotics. Looking through [facility] health link she has had wounds on both feet in the past for which she is followed with Dr.Petery at [facility]. Past medical history includes type 2 diabetes with most recent hemoglobin A1c of 12.4, diabetic peripheral neuropathy, hypertension, hyperlipidemia, lichen simplex chronicus ABI in our clinic was 0.91 on the right. She is a non-smoker Patient History Information obtained from Patient. Allergies No Known Drug Allergies Family History Diabetes - Father,Siblings,Child, Hypertension - Mother, No family history of Cancer, Heart Disease, Hereditary Spherocytosis, Kidney Disease, Lung Disease, Seizures, Stroke, Thyroid Problems, Tuberculosis. Social History Former smoker - quit 30 years ago, Marital Status - Widowed, Alcohol Use - Never, Drug Use - No History, Caffeine Use - Moderate - coffee, tea. Medical History Cardiovascular Patient has history of Hypertension Endocrine Patient has history of Type II Diabetes Neurologic Patient has history of Neuropathy Patient is treated with Insulin. Blood sugar is tested. Medical A Surgical History Notes nd Eyes Hypertensive Retinopathy Cardiovascular Hyperlipdemia Review of Systems (ROS) Constitutional Symptoms (General Health) Denies complaints or symptoms of Fatigue, Fever, Chills, Marked Weight Change. Ear/Nose/Mouth/Throat Denies complaints or symptoms of Chronic sinus problems or rhinitis. Respiratory Denies complaints or symptoms of Chronic or frequent coughs, Shortness of Breath. Cardiovascular Denies complaints or symptoms of Chest pain. Gastrointestinal Denies complaints or symptoms of Frequent diarrhea, Nausea, Vomiting. Genitourinary Denies complaints or symptoms of Frequent urination. Integumentary  (Skin) Complains or has symptoms of Wounds - wound on right foot. Musculoskeletal Denies complaints or symptoms of Muscle Pain, Muscle Weakness. Psychiatric Denies complaints or symptoms of Claustrophobia, Suicidal. Objective Constitutional Patient is hypertensive.. Pulse regular and within target range for patient.Marland Kitchen Respirations regular, non-labored and within target range.. Temperature is normal and within the target range for the patient.Marland Kitchen Appears in no distress. Vitals Time Taken: 3:18 PM, Height: 68 in, Source: Stated, Weight: 200 lbs, Source: Stated, BMI: 30.4, Temperature: 98.3 F, Pulse: 90 bpm, Respiratory Rate: 18 breaths/min, Blood Pressure: 197/84 mmHg, Capillary Blood Glucose: 200 mg/dl. General Notes: glucose per pt report Respiratory work of breathing is normal. Cardiovascular Dorsalis pedis pulses and posterior tibial were palpable on the right. Neurological Completely absent to the monofilament in the forefoot. General Notes: Wound exam; this areas over the  right first metatarsal head. Small open area but in the middle of a thick mound of callus with clear circumferential undermining. Using pickups and a #15 scalpel the thick callus and subcutaneous tissue removed. Then with a #5 curette the surface of the remaining wound was debrided. There is really no evidence of infection here. No surrounding erythema no palpable bone. Integumentary (Hair, Skin) Wound #1 status is Open. Original cause of wound was Gradually Appeared. The wound is located on the Right Metatarsal head first. The wound measures 0.8cm length x 0.5cm width x 1cm depth; 0.314cm^2 area and 0.314cm^3 volume. There is Fat Layer (Subcutaneous Tissue) exposed. There is no tunneling noted, however, there is undermining starting at 12:00 and ending at 12:00 with a maximum distance of 0.9cm. There is a medium amount of serosanguineous drainage noted. The wound margin is thickened. There is large (67-100%) pink  granulation within the wound bed. There is a small (1-33%) amount of necrotic tissue within the wound bed including Adherent Slough. Assessment Active Problems ICD-10 Type 2 diabetes mellitus with foot ulcer Non-pressure chronic ulcer of other part of right foot with fat layer exposed Type 2 diabetes mellitus with diabetic polyneuropathy Procedures Wound #1 Pre-procedure diagnosis of Wound #1 is a Diabetic Wound/Ulcer of the Lower Extremity located on the Right Metatarsal head first .Severity of Tissue Pre Debridement is: Fat layer exposed. There was a Excisional Skin/Subcutaneous Tissue Debridement with a total area of 4 sq cm performed by Ricard Dillon., MD. With the following instrument(s): Curette to remove Viable and Non-Viable tissue/material. Material removed includes Callus, Subcutaneous Tissue, Slough, and Skin: Epidermis. No specimens were taken. A time out was conducted at 16:25, prior to the start of the procedure. A Minimum amount of bleeding was controlled with Pressure. The procedure was tolerated well. Post Debridement Measurements: 1.3cm length x 0.8cm width x 0.2cm depth; 0.163cm^3 volume. Character of Wound/Ulcer Post Debridement is improved. Severity of Tissue Post Debridement is: Fat layer exposed. Post procedure Diagnosis Wound #1: Same as Pre-Procedure Plan Follow-up Appointments: Return Appointment in 1 week. Bathing/ Shower/ Hygiene: May shower and wash wound with soap and water. - with dressing changes Off-Loading: Wedge shoe to: - right foot when not at work Other: - felt callous pad WOUND #1: - Metatarsal head first Wound Laterality: Right Cleanser: Soap and Water 1 x Per Day/30 Days Discharge Instructions: May shower and wash wound with dial antibacterial soap and water prior to dressing change. Prim Dressing: KerraCel Ag Gelling Fiber Dressing, 2x2 in (silver alginate) (DME) (Generic) 1 x Per Day/30 Days ary Discharge Instructions: Apply silver  alginate to wound bed as instructed Secondary Dressing: Woven Gauze Sponges 2x2 in (DME) (Generic) 1 x Per Day/30 Days Discharge Instructions: Apply over primary dressing as directed. Secondary Dressing: Optifoam Non-Adhesive Dressing, 4x4 in (DME) (Generic) 1 x Per Day/30 Days Discharge Instructions: Apply over primary dressing cut to make foam donut to help offload Secured With: Conforming Stretch Gauze Bandage, Sterile 2x75 (in/in) (DME) (Generic) 1 x Per Day/30 Days Discharge Instructions: Secure with stretch gauze as directed. Secured With: Paper T ape, 2x10 (in/yd) (DME) (Generic) 1 x Per Day/30 Days Discharge Instructions: Secure dressing with tape as directed. 1. Neuropathic wound in the setting of type 2 diabetes with peripheral neuropathy. We are going to use silver alginate to this area with a border foam dressing. She will be changing this every second day 2. 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 The patient needs to be able to pay her bills. I think we can probably use felt in the soft Dr. Felicie Morn insole in her shoe and see how that does. 3. For the time she is not working we gave her a forefoot offloading boot but she was too unsteady in this so we gave her a surgical shoe with felt offloading. I explained to her that the gold standard of offloading is a total contact cast but she would not be able to work with this 4. I also emphasized that family will need to help at all activities such as shopping groceries etc. to help this patient stay off this. 5. No antibiotics were necessary a culture was not done I spent 35 minutes in reviewing this patient's past medical history, face-to-face evaluation and preparation of this record Electronic Signature(s) Signed: 09/24/2020 5:33:44 PM By: Linton Ham MD Entered By: Linton Ham on 09/24/2020  17:03:59 -------------------------------------------------------------------------------- HxROS Details Patient Name: Date of Service: Nicole Jester J. 09/24/2020 2:45 PM Medical Record Number: [id number] Patient Account Number: [id number] Date of Birth/Sex: Treating RN: 03-Oct-1960 (60 y.o. [name] Primary Care Provider: PA TEL, Darel Hong Other Clinician: Referring Provider: Treating Provider/Extender: Linton Ham PA TEL, Ena Dawley in Treatment: 0 Information Obtained From Patient Constitutional Symptoms (General Health) Complaints and Symptoms: Negative for: Fatigue; Fever; Chills; Marked Weight Change Ear/Nose/Mouth/Throat Complaints and Symptoms: Negative for: Chronic sinus problems or rhinitis Respiratory Complaints and Symptoms: Negative for: Chronic or frequent coughs; Shortness of Breath Cardiovascular Complaints and Symptoms: Negative for: Chest pain Medical History: Positive for: Hypertension Past Medical History Notes: Hyperlipdemia Gastrointestinal Complaints and Symptoms: Negative for: Frequent diarrhea; Nausea; Vomiting Genitourinary Complaints and Symptoms: Negative for: Frequent urination Integumentary (Skin) Complaints and Symptoms: Positive for: Wounds - wound on right foot Musculoskeletal Complaints and Symptoms: Negative for: Muscle Pain; Muscle Weakness Psychiatric Complaints and Symptoms: Negative for: Claustrophobia; Suicidal Eyes Medical History: Past Medical History Notes: Hypertensive Retinopathy Hematologic/Lymphatic Endocrine Medical History: Positive for: Type II Diabetes Time with diabetes: 1984 Treated with: Insulin Blood sugar tested every day: Yes Tested : 3 times a day Immunological Neurologic Medical History: Positive for: Neuropathy Oncologic Immunizations Pneumococcal Vaccine: Received Pneumococcal Vaccination: Yes Implantable Devices None Family and Social History Cancer: No; Diabetes: Yes -  Father,Siblings,Child; Heart Disease: No; Hereditary Spherocytosis: No; Hypertension: Yes - Mother; Kidney Disease: No; Lung Disease: No; Seizures: No; Stroke: No; Thyroid Problems: No; Tuberculosis: No; Former smoker - quit 30 years ago; Marital Status - Widowed; Alcohol Use: Never; Drug Use: No History; Caffeine Use: Moderate - coffee, tea; Financial Concerns: No; Food, Clothing or Shelter Needs: No; Support System Lacking: No; Transportation Concerns: No Engineer, maintenance) Signed: 09/24/2020 5:33:44 PM By: Linton Ham MD Signed: 09/24/2020 5:52:57 PM By: Levan Hurst RN, BSN Entered By: Levan Hurst on 09/24/2020 15:36:32 -------------------------------------------------------------------------------- [place] Details Patient Name: Date of Service: Nicole Golden 09/24/2020 Medical Record Number: [id number] Patient Account Number: [id number] Date of Birth/Sex: Treating RN: [birth year] (60 y.o. Nicole Golden Primary Care Provider: PA TEL, Darel Hong Other Clinician: Referring Provider: Treating Provider/Extender: Linton Ham PA TEL, Ena Dawley in Treatment: 0 Diagnosis Coding ICD-10 Codes Code Description E11.621 Type 2 diabetes mellitus with foot ulcer L97.512 Non-pressure chronic ulcer of other part of right foot with fat layer exposed E11.42 Type 2 diabetes mellitus with diabetic polyneuropathy Facility Procedures CPT4 Code: [id number] Description: [id number] - WOUND  CARE VISIT-LEV 3 EST PT Modifier: Quantity: 1 CPT4 Code: [id number] Description: 81157 - DEB SUBQ TISSUE 20 SQ CM/< ICD-10 Diagnosis Description L97.512 Non-pressure chronic ulcer of other part of right foot with fat layer exposed Modifier: Quantity: 1 Physician Procedures : CPT4 Code Description Modifier [id number] WC PHYS LEVEL 3 NEW PT 25 ICD-10 Diagnosis Description E11.621 Type 2 diabetes mellitus with foot ulcer L97.512 Non-pressure chronic ulcer of other part of right foot with fat layer exposed  E11.42 Type 2 diabetes  mellitus with diabetic polyneuropathy Quantity: 1 : [id number] - WC PHYS SUBQ TISS 20 SQ CM ICD-10 Diagnosis Description L97.512 Non-pressure chronic ulcer of other part of right foot with fat layer exposed Quantity: 1 Electronic Signature(s) Signed: 09/27/2020 12:41:38 PM By: Deon Pilling Signed: 09/27/2020 1:23:55 PM By: Linton Ham MD Previous Signature: 09/24/2020 5:05:06 PM Version By: Linton Ham MD Entered By: Deon Pilling on 09/27/2020 12:41:38

## 2020-09-24 NOTE — Progress Notes (Signed)
Nicole Golden, Nicole Golden ([id number]) Visit Report for 09/24/2020 Allergy List Details Patient Name: Date of Service: Nicole Golden 09/24/2020 2:45 PM Medical Record Number: [id number] Patient Account Number: [id number] Date of Birth/Sex: Treating RN: [birth year] (60 y.o. [name] Primary Care Amaru Burroughs: PA TEL, Darel Hong Other Clinician: Referring Lathyn Griggs: Treating Mose Colaizzi/Extender: Linton Ham PA TEL, Ena Dawley in Treatment: 0 Allergies Active Allergies No Known Drug Allergies Type: Allergen Allergy Notes Electronic Signature(s) Signed: 09/24/2020 5:52:57 PM By: Levan Hurst RN, BSN Entered By: Levan Hurst on 09/24/2020 15:19:41 -------------------------------------------------------------------------------- Arrival Information Details Patient Name: Date of Service: Nicole Golden, Nicole Golden 09/24/2020 2:45 PM Medical Record Number: [id number] Patient Account Number: [id number] Date of Birth/Sex: Treating RN: 01-Jun-1961 (60 y.o. [name] Primary Care Tyrease Vandeberg: PA TEL, Darel Hong Other Clinician: Referring Trevontae Lindahl: Treating Lambert Jeanty/Extender: Linton Ham PA TEL, Ena Dawley in Treatment: 0 Visit Information Patient Arrived: Ambulatory Arrival Time: 15:15 Accompanied By: alone Transfer Assistance: None Patient Identification Verified: Yes Secondary Verification Process Completed: Yes Patient Requires Transmission-Based Precautions: No Patient Has Alerts: No Electronic Signature(s) Signed: 09/24/2020 5:52:57 PM By: Levan Hurst RN, BSN Entered By: Levan Hurst on 09/24/2020 15:31:34 -------------------------------------------------------------------------------- Clinic Level of Care Assessment Details Patient Name: Date of Service: Nicole Golden. 09/24/2020 2:45 PM Medical Record Number: [id number] Patient Account Number: [id number] Date of Birth/Sex: Treating RN: 06-03-1961 (60 y.o. Nicole Golden Primary Care Curry Dulski: PA TEL, Darel Hong Other  Clinician: Referring Fermon Ureta: Treating Briselda Naval/Extender: Linton Ham PA TEL, Ena Dawley in Treatment: 0 Clinic Level of Care Assessment Items TOOL 1 Quantity Score []  - 0 Use when EandM and Procedure is performed on INITIAL visit ASSESSMENTS - Nursing Assessment / Reassessment X- 1 20 General Physical Exam (combine w/ comprehensive assessment (listed just below) when performed on new pt. evals) X- 1 25 Comprehensive Assessment (HX, ROS, Risk Assessments, Wounds Hx, etc.) ASSESSMENTS - Wound and Skin Assessment / Reassessment []  - 0 Dermatologic / Skin Assessment (not related to wound area) ASSESSMENTS - Ostomy and/or Continence Assessment and Care []  - 0 Incontinence Assessment and Management []  - 0 Ostomy Care Assessment and Management (repouching, etc.) PROCESS - Coordination of Care X - Simple Patient / Family Education for ongoing care 1 15 []  - 0 Complex (extensive) Patient / Family Education for ongoing care X- 1 10 Staff obtains Programmer, systems, Records, T Results / Process Orders est []  - 0 Staff telephones HHA, Nursing Homes / Clarify orders / etc []  - 0 Routine Transfer to another Facility (non-emergent condition) []  - 0 Routine Hospital Admission (non-emergent condition) X- 1 15 New Admissions / Biomedical engineer / Ordering NPWT Apligraf, etc. , []  - 0 Emergency Hospital Admission (emergent condition) PROCESS - Special Needs []  - 0 Pediatric / Minor Patient Management []  - 0 Isolation Patient Management []  - 0 Hearing / Language / Visual special needs []  - 0 Assessment of Community assistance (transportation, D/C planning, etc.) []  - 0 Additional assistance / Altered mentation []  - 0 Support Surface(s) Assessment (bed, cushion, seat, etc.) INTERVENTIONS - Miscellaneous []  - 0 External ear exam []  - 0 Patient Transfer (multiple staff / Civil Service fast streamer / Similar devices) []  - 0 Simple Staple / Suture removal (25 or less) []  - 0 Complex Staple /  Suture removal (26 or more) []  - 0 Hypo/Hyperglycemic Management (do not check if billed separately) X- 1 15 Ankle / Brachial Index (ABI) - do not check if billed separately Has the patient been seen  at the hospital within the last three years: Yes Total Score: 100 Level Of Care: New/Established - Level 3 Electronic Signature(s) Signed: 09/24/2020 5:52:16 PM By: Baruch Gouty RN, BSN Entered By: Baruch Gouty on 09/24/2020 16:32:47 -------------------------------------------------------------------------------- Encounter Discharge Information Details Patient Name: Date of Service: Nicole Golden, Nicole Burns J. 09/24/2020 2:45 PM Medical Record Number: [id number] Patient Account Number: [id number] Date of Birth/Sex: Treating RN: 1960-12-09 (60 y.o. [name] Primary Care Brenee Gajda: PA TEL, Darel Hong Other Clinician: Referring Nikcole Eischeid: Treating Elvia Aydin/Extender: Linton Ham PA TEL, Ena Dawley in Treatment: 0 Encounter Discharge Information Items Post Procedure Vitals Discharge Condition: Stable Temperature (F): 98.3 Ambulatory Status: Ambulatory Pulse (bpm): 90 Discharge Destination: Home Respiratory Rate (breaths/min): 18 Transportation: Private Auto Blood Pressure (mmHg): 197/84 Accompanied By: self Schedule Follow-up Appointment: Yes Clinical Summary of Care: Notes Patient unable to ambulate with wedge shoe. Applied x2 felt pieces to surgical shoe. MD made aware. Patient in agreement how to care for wound. Electronic Signature(s) Signed: 09/24/2020 5:42:38 PM By: Deon Pilling Entered By: Deon Pilling on 09/24/2020 17:39:41 -------------------------------------------------------------------------------- Lower Extremity Assessment Details Patient Name: Date of Service: Nicole Golden 09/24/2020 2:45 PM Medical Record Number: [id number] Patient Account Number: [id number] Date of Birth/Sex: Treating RN: 1961/05/27 (60 y.o. [name] Primary Care Elai Vanwyk:  PA TEL, Darel Hong Other Clinician: Referring Hatsumi Steinhart: Treating Raynette Arras/Extender: Linton Ham PA TEL, Ena Dawley in Treatment: 0 Edema Assessment Assessed: [Left: No] [Right: No] Edema: [Left: Ye] [Right: s] Calf Left: Right: Point of Measurement: 34 cm From Medial Instep 35 cm Ankle Left: Right: Point of Measurement: 11 cm From Medial Instep 11 cm Vascular Assessment Pulses: Dorsalis Pedis Palpable: [Right:Yes] Blood Pressure: Brachial: [Right:197] Ankle: [Right:Dorsalis Pedis: 180 0.91] Electronic Signature(s) Signed: 09/24/2020 5:52:57 PM By: Levan Hurst RN, BSN Entered By: Levan Hurst on 09/24/2020 15:49:37 -------------------------------------------------------------------------------- Multi Wound Chart Details Patient Name: Date of Service: Nicole Golden, Nicole Burns J. 09/24/2020 2:45 PM Medical Record Number: [id number] Patient Account Number: [id number] Date of Birth/Sex: Treating RN: 12-20-1960 (60 y.o. Nicole Golden Primary Care Montre Harbor: PA TEL, Darel Hong Other Clinician: Referring Veria Stradley: Treating Elowen Debruyn/Extender: Linton Ham PA TEL, Ena Dawley in Treatment: 0 Vital Signs Height(in): 68 Capillary Blood Glucose(mg/dl): 200 Weight(lbs): 200 Pulse(bpm): 90 Body Mass Index(BMI): 30 Blood Pressure(mmHg): 197/84 Temperature(F): 98.3 Respiratory Rate(breaths/min): 18 Photos: [1:No Photos Right Metatarsal head first] [N/A:N/A N/A] Wound Location: [1:Gradually Appeared] [N/A:N/A] Wounding Event: [1:Diabetic Wound/Ulcer of the Lower] [N/A:N/A] Primary Etiology: [1:Extremity Hypertension, Type II Diabetes,] [N/A:N/A] Comorbid History: [1:Neuropathy 06/28/2020] [N/A:N/A] Date Acquired: [1:0] [N/A:N/A] Weeks of Treatment: [1:Open] [N/A:N/A] Wound Status: [1:0.8x0.5x1] [N/A:N/A] Measurements L x W x D (cm) [1:0.314] [N/A:N/A] A (cm) : rea [1:0.314] [N/A:N/A] Volume (cm) : [1:12] Starting Position 1 (o'clock): [1:12] Ending Position 1 (o'clock):  [1:0.9] Maximum Distance 1 (cm): [1:Yes] [N/A:N/A] Undermining: [1:Grade 2] [N/A:N/A] Classification: [1:Medium] [N/A:N/A] Exudate A mount: [1:Serosanguineous] [N/A:N/A] Exudate Type: [1:red, brown] [N/A:N/A] Exudate Color: [1:Thickened] [N/A:N/A] Wound Margin: [1:Large (67-100%)] [N/A:N/A] Granulation A mount: [1:Pink] [N/A:N/A] Granulation Quality: [1:Small (1-33%)] [N/A:N/A] Necrotic A mount: [1:Fat Layer (Subcutaneous Tissue): Yes N/A] Exposed Structures: [1:Fascia: No Tendon: No Muscle: No Joint: No Bone: No None] [N/A:N/A] Epithelialization: [1:Debridement - Excisional] [N/A:N/A] Debridement: Pre-procedure Verification/Time Out 16:25 [N/A:N/A] Taken: [1:Callus, Subcutaneous, Slough] [N/A:N/A] Tissue Debrided: [1:Skin/Subcutaneous Tissue] [N/A:N/A] Level: [1:4] [N/A:N/A] Debridement A (sq cm): [1:rea Curette] [N/A:N/A] Instrument: [1:Minimum] [N/A:N/A] Bleeding: [1:Pressure] [N/A:N/A] Hemostasis A chieved: [1:Procedure was tolerated well] [N/A:N/A] Debridement Treatment Response: [1:1.3x0.8x0.2] [N/A:N/A] Post Debridement Measurements L x W x  D (cm) [1:0.163] [N/A:N/A] Post Debridement Volume: (cm) [1:Debridement] [N/A:N/A] Treatment Notes Electronic Signature(s) Signed: 09/24/2020 5:33:44 PM By: Linton Ham MD Signed: 09/24/2020 5:52:16 PM By: Baruch Gouty RN, BSN Entered By: Linton Ham on 09/24/2020 16:53:43 -------------------------------------------------------------------------------- Multi-Disciplinary Care Plan Details Patient Name: Date of Service: Nicole Golden, Nicole Burns J. 09/24/2020 2:45 PM Medical Record Number: [id number] Patient Account Number: [id number] Date of Birth/Sex: Treating RN: 04-07-1961 (60 y.o. Nicole Golden Primary Care Aveleen Nevers: PA TEL, Darel Hong Other Clinician: Referring Damareon Lanni: Treating Mackayla Mullins/Extender: Linton Ham PA TEL, Ena Dawley in Treatment: 0 Active Inactive Nutrition Nursing Diagnoses: Impaired glucose  control: actual or potential Potential for alteratiion in Nutrition/Potential for imbalanced nutrition Goals: Patient/caregiver will maintain therapeutic glucose control Date Initiated: 09/24/2020 Target Resolution Date: 10/22/2020 Goal Status: Active Interventions: Assess HgA1c results as ordered upon admission and as needed Assess patient nutrition upon admission and as needed per policy Provide education on elevated blood sugars and impact on wound healing Treatment Activities: Patient referred to Primary Care Physician for further nutritional evaluation : 09/24/2020 Notes: Wound/Skin Impairment Nursing Diagnoses: Impaired tissue integrity Knowledge deficit related to ulceration/compromised skin integrity Goals: Patient/caregiver will verbalize understanding of skin care regimen Date Initiated: 09/24/2020 Target Resolution Date: 10/22/2020 Goal Status: Active Ulcer/skin breakdown will have a volume reduction of 30% by week 4 Date Initiated: 09/24/2020 Target Resolution Date: 10/22/2020 Goal Status: Active Interventions: Assess patient/caregiver ability to obtain necessary supplies Assess patient/caregiver ability to perform ulcer/skin care regimen upon admission and as needed Assess ulceration(s) every visit Provide education on ulcer and skin care Treatment Activities: Skin care regimen initiated : 09/24/2020 Topical wound management initiated : 09/24/2020 Notes: Electronic Signature(s) Signed: 09/24/2020 5:52:16 PM By: Baruch Gouty RN, BSN Entered By: Baruch Gouty on 09/24/2020 16:31:25 -------------------------------------------------------------------------------- Pain Assessment Details Patient Name: Date of Service: Nicole Golden 09/24/2020 2:45 PM Medical Record Number: [id number] Patient Account Number: [id number] Date of Birth/Sex: Treating RN: 06-15-1961 (60 y.o. [name] Primary Care Janaiah Vetrano: PA TEL, Darel Hong Other Clinician: Referring  Gracious Renken: Treating Carey Lafon/Extender: Linton Ham PA TEL, Ena Dawley in Treatment: 0 Active Problems Location of Pain Severity and Description of Pain Patient Has Paino No Site Locations Pain Management and Medication Current Pain Management: Electronic Signature(s) Signed: 09/24/2020 5:52:57 PM By: Levan Hurst RN, BSN Entered By: Levan Hurst on 09/24/2020 15:39:44 -------------------------------------------------------------------------------- Patient/Caregiver Education Details Patient Name: Date of Service: Nicole Golden 1/28/2022andnbsp2:45 PM Medical Record Number: [id number] Patient Account Number: [id number] Date of Birth/Gender: Treating RN: [birth year] (60 y.o. Nicole Golden Primary Care Physician: PA TEL, Darel Hong Other Clinician: Referring Physician: Treating Physician/Extender: Linton Ham PA TEL, Ena Dawley in Treatment: 0 Education Assessment Education Provided To: Patient Education Topics Provided Elevated Blood Sugar/ Impact on Healing: Handouts: Elevated Blood Sugars: How Do They Affect Wound Healing Methods: Explain/Verbal, Printed Responses: Reinforcements needed, State content correctly Offloading: Handouts: What is Offloadingo Methods: Explain/Verbal, Printed Responses: Reinforcements needed, State content correctly Wound/Skin Impairment: Handouts: Caring for Your Ulcer, Skin Care Do's and Dont's Methods: Explain/Verbal, Printed Responses: Reinforcements needed, State content correctly Electronic Signature(s) Signed: 09/24/2020 5:52:16 PM By: Baruch Gouty RN, BSN Entered By: Baruch Gouty on 09/24/2020 16:32:09 -------------------------------------------------------------------------------- Wound Assessment Details Patient Name: Date of Service: Nicole Golden 09/24/2020 2:45 PM Medical Record Number: [id number] Patient Account Number: [id number] Date of Birth/Sex: Treating RN: 1961/01/24 (60 y.o. [name] Primary Care Almarie Kurdziel: PA Kristen Loader Other Clinician: Referring Kiamesha Samet: Treating Marry Kusch/Extender: Linton Ham  PA TEL, NILA Y Weeks in Treatment: 0 Wound Status Wound Number: 1 Primary Etiology: Diabetic Wound/Ulcer of the Lower Extremity Wound Location: Right Metatarsal head first Wound Status: Open Wounding Event: Gradually Appeared Comorbid History: Hypertension, Type II Diabetes, Neuropathy Date Acquired: 06/28/2020 Weeks Of Treatment: 0 Clustered Wound: No Wound Measurements Length: (cm) 0.8 Width: (cm) 0.5 Depth: (cm) 1 Area: (cm) 0.314 Volume: (cm) 0.314 % Reduction in Area: % Reduction in Volume: Epithelialization: None Tunneling: No Undermining: Yes Starting Position (o'clock): 12 Ending Position (o'clock): 12 Maximum Distance: (cm) 0.9 Wound Description Classification: Grade 2 Wound Margin: Thickened Exudate Amount: Medium Exudate Type: Serosanguineous Exudate Color: red, brown Foul Odor After Cleansing: No Slough/Fibrino Yes Wound Bed Granulation Amount: Large (67-100%) Exposed Structure Granulation Quality: Pink Fascia Exposed: No Necrotic Amount: Small (1-33%) Fat Layer (Subcutaneous Tissue) Exposed: Yes Necrotic Quality: Adherent Slough Tendon Exposed: No Muscle Exposed: No Joint Exposed: No Bone Exposed: No Treatment Notes Wound #1 (Metatarsal head first) Wound Laterality: Right Cleanser Soap and Water Discharge Instruction: May shower and wash wound with dial antibacterial soap and water prior to dressing change. Peri-Wound Care Topical Primary Dressing KerraCel Ag Gelling Fiber Dressing, 2x2 in (silver alginate) Discharge Instruction: Apply silver alginate to wound bed as instructed Secondary Dressing Woven Gauze Sponges 2x2 in Discharge Instruction: Apply over primary dressing as directed. Optifoam Non-Adhesive Dressing, 4x4 in Discharge Instruction: Apply over primary dressing cut to make foam donut to help  offload Secured With Conforming Stretch Gauze Bandage, Sterile 2x75 (in/in) Discharge Instruction: Secure with stretch gauze as directed. Paper Tape, 2x10 (in/yd) Discharge Instruction: Secure dressing with tape as directed. Compression Wrap Compression Stockings Add-Ons Electronic Signature(s) Signed: 09/24/2020 5:52:57 PM By: Levan Hurst RN, BSN Entered By: Levan Hurst on 09/24/2020 15:39:26 -------------------------------------------------------------------------------- Vitals Details Patient Name: Date of Service: Nicole Golden, Nicole Burns J. 09/24/2020 2:45 PM Medical Record Number: [id number] Patient Account Number: [id number] Date of Birth/Sex: Treating RN: 08-Aug-1961 (60 y.o. [name] Primary Care Lyric Hoar: PA TEL, Darel Hong Other Clinician: Referring Antoni Stefan: Treating Lacy Taglieri/Extender: Linton Ham PA TEL, Ena Dawley in Treatment: 0 Vital Signs Time Taken: 15:18 Temperature (F): 98.3 Height (in): 68 Pulse (bpm): 90 Source: Stated Respiratory Rate (breaths/min): 18 Weight (lbs): 200 Blood Pressure (mmHg): 197/84 Source: Stated Capillary Blood Glucose (mg/dl): 200 Body Mass Index (BMI): 30.4 Reference Range: 80 - 120 mg / dl Notes glucose per pt report Electronic Signature(s) Signed: 09/24/2020 5:52:57 PM By: Levan Hurst RN, BSN Entered By: Levan Hurst on 09/24/2020 15:19:33

## 2020-09-29 ENCOUNTER — Encounter: Payer: Medicare HMO | Attending: "Endocrinology | Admitting: Nutrition

## 2020-09-29 ENCOUNTER — Encounter: Payer: Self-pay | Admitting: Nutrition

## 2020-09-29 ENCOUNTER — Telehealth: Payer: Self-pay | Admitting: Nutrition

## 2020-09-29 ENCOUNTER — Other Ambulatory Visit: Payer: Self-pay

## 2020-09-29 VITALS — Ht 68.0 in | Wt 199.0 lb

## 2020-09-29 DIAGNOSIS — E118 Type 2 diabetes mellitus with unspecified complications: Secondary | ICD-10-CM | POA: Insufficient documentation

## 2020-09-29 DIAGNOSIS — E1165 Type 2 diabetes mellitus with hyperglycemia: Secondary | ICD-10-CM | POA: Diagnosis present

## 2020-09-29 DIAGNOSIS — E669 Obesity, unspecified: Secondary | ICD-10-CM | POA: Diagnosis present

## 2020-09-29 DIAGNOSIS — IMO0002 Reserved for concepts with insufficient information to code with codable children: Secondary | ICD-10-CM

## 2020-09-29 NOTE — Patient Instructions (Addendum)
Goals  Follow My Plate Eat meals on time Eat 2-3 carb choices per meal Drink only water Increase fresh frutis and vegetables Measure foods out. Get A1C down to 7%. Lose 2 lbs per month.

## 2020-09-29 NOTE — Telephone Encounter (Signed)
TC to do phone visit. No answer. Left VM to return call to reschedule phone visit.

## 2020-09-29 NOTE — Progress Notes (Signed)
Medical Nutrition Therapy  Appointment Start time:  5361  Appointment End time:  4431  Primary concerns today: Diabetes Type 2 , Referral diagnosis: E11.8  Preferred learning style: no preference indicated Learning readiness:  ready  NUTRITION ASSESSMENT  FBS: 70- 150's.   Before lunch Anthropometrics  Wt Readings from Last 3 Encounters:  09/29/20 199 lb (90.3 kg)  08/26/20 198 lb 9.6 oz (90.1 kg)  03/25/20 189 lb (85.7 kg)   Ht Readings from Last 3 Encounters:  09/29/20 5\' 8"  (1.727 m)  08/26/20 5\' 8"  (1.727 m)  03/25/20 5\' 8"  (1.727 m)   Body mass index is 30.26 kg/m. @BMIFA @ Facility age limit for growth percentiles is 20 years. Facility age limit for growth percentiles is 20 years.   Clinical Medical Hx: HTN, Hyperlipidemia, DM  Medications: Metformin 500 mg BID. Lantus 60 units. And Novolog 10 units plus sliding scale with meals. She note she has not been taking insulin as prescribed.  Labs:  Lab Results  Component Value Date   HGBA1C 12.4 08/12/2020   CMP Latest Ref Rng & Units 03/23/2020 04/25/2016 04/24/2016  Glucose 70 - 99 mg/dL 317(H) 260(H) -  BUN 6 - 20 mg/dL 20 10 -  Creatinine 0.44 - 1.00 mg/dL 0.95 0.67 0.91  Sodium 135 - 145 mmol/L 138 135 -  Potassium 3.5 - 5.1 mmol/L 4.0 3.5 -  Chloride 98 - 111 mmol/L 104 107 -  CO2 22 - 32 mmol/L 24 18(L) -  Calcium 8.9 - 10.3 mg/dL 9.0 8.4(L) -  Total Protein 6.5 - 8.1 g/dL 6.7 6.5 -  Total Bilirubin 0.3 - 1.2 mg/dL 0.7 1.1 -  Alkaline Phos 38 - 126 U/L 86 115 -  AST 15 - 41 U/L 27 38 -  ALT 0 - 44 U/L 32 42 -   Lipid Panel    Notable Signs/Symptoms:  Fatigue,   Lifestyle & Dietary Hx Working 3rd shift. Currently has a wound from callous on his right foot.   Estimated daily fluid intake: 90  oz Supplements: none Sleep:5-6 Stress / self-care:  Current average weekly physical activity: going to go back to the gym soon.  24-Hr Dietary Recall First Meal: skips. Got up late;;   2 bacon, 2 pancakes,  fried eggs -2, Diet Gingerale Snack:  Second Meal: 2 fried fish/shrimp, salad-Blue cheese, Diet Ginger ale. Snack:  Third Meal: Fried egg sandwich, wheat bread 2 slices, diet ginerale. Snack:  Beverages: Diet soda, water,  Estimated Energy Needs Calories: 1500  Carbohydrate: 172g Protein: 112g Fat: 42g   NUTRITION DIAGNOSIS  NB-1.1 Food and nutrition-related knowledge deficit As related to Diabetes Type 2.  As evidenced by A1C was 10%.   NUTRITION INTERVENTION  Nutrition education (E-1) on the following topics:  . Nutrition and Diabetes education provided on My Plate, CHO counting, meal planning, portion sizes, timing of meals, avoiding snacks between meals unless having a low blood sugar, target ranges for A1C and blood sugars, signs/symptoms and treatment of hyper/hypoglycemia, monitoring blood sugars, taking medications as prescribed, benefits of exercising 30 minutes per day and prevention of complications of DM. Marland Kitchen   Handouts Provided Include   My Plate, Diabetes instructions, portion control, carb counting  Learning Style & Readiness for Change Teaching method utilized: Visual & Auditory  Demonstrated degree of understanding via: Teach Back  Barriers to learning/adherence to lifestyle change: none  Goals Established by Pt .    Follow My Plate Eat meals on time Eat 2-3 carb choices per meal Drink  only water Increase fresh frutis and vegetables Measure foods out. Get A1C down to 7%. Lose 2 lbs per month.  MONITORING & EVALUATION Dietary intake, weekly physical activity, and blood sugars  in 1 month .

## 2020-10-01 ENCOUNTER — Other Ambulatory Visit: Payer: Self-pay

## 2020-10-01 ENCOUNTER — Encounter (HOSPITAL_BASED_OUTPATIENT_CLINIC_OR_DEPARTMENT_OTHER): Payer: Medicare HMO | Attending: Internal Medicine | Admitting: Internal Medicine

## 2020-10-01 DIAGNOSIS — E1142 Type 2 diabetes mellitus with diabetic polyneuropathy: Secondary | ICD-10-CM | POA: Diagnosis not present

## 2020-10-01 DIAGNOSIS — L97512 Non-pressure chronic ulcer of other part of right foot with fat layer exposed: Secondary | ICD-10-CM | POA: Diagnosis not present

## 2020-10-01 DIAGNOSIS — E11621 Type 2 diabetes mellitus with foot ulcer: Secondary | ICD-10-CM | POA: Insufficient documentation

## 2020-10-01 NOTE — Progress Notes (Signed)
AVALINE, STILLSON ([id number]) Visit Report for 10/01/2020 HPI Details Patient Name: Date of Service: Isaias Cowman 10/01/2020 9:00 A M Medical Record Number: [id number] Patient Account Number: [id number] Date of Birth/Sex: Treating RN: 1960-10-28 (60 y.o. Tommye Standard Primary Care Provider: PA TEL, Candi Leash Other Clinician: Referring Provider: Treating Provider/Extender: Baltazar Najjar PA TEL, Verdell Carmine in Treatment: 1 History of Present Illness HPI Description: Admission 09/24/2020 This is a 60 year old woman with type 2 diabetes poorly controlled and polyneuropathy. She has been been dealing with an area on her right first plantar metatarsal head for about 2 months. She has been following at [facility] using Santyl and callus debridements. 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 She has been using Santyl for 2 months she has had x-rays done in the podiatrist office there was apparently nothing noted although I am not able to review this. She has not been on antibiotics. Looking through [facility] health link she has had wounds on both feet in the past for which she is followed with Dr.Petery at [facility]. Past medical history includes type 2 diabetes with most recent hemoglobin A1c of 12.4, diabetic peripheral neuropathy, hypertension, hyperlipidemia, lichen simplex chronicus ABI in our clinic was 0.91 on the right. 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 She is not working currently normally works at [employer]. She said she can hold out to the end of the month. The wound measures slightly smaller today we use silver alginate. She did not tolerate a forefoot offloading boot we have her in a surgical shoe with felt offloading Electronic Signature(s) Signed: 10/01/2020  4:22:00 PM By: Baltazar Najjar MD Entered By: Baltazar Najjar on 10/01/2020 09:38:00 -------------------------------------------------------------------------------- Physical Exam Details Patient Name: Date of Service: Gabriel Cirri J. 10/01/2020 9:00 A M Medical Record Number: [id number] Patient Account Number: [id number] Date of Birth/Sex: Treating RN: April 27, 1961 (60 y.o. Tommye Standard Primary Care Provider: PA TEL, Candi Leash Other Clinician: Referring Provider: Treating Provider/Extender: Baltazar Najjar PA TEL, Verdell Carmine in Treatment: 1 Constitutional Patient is hypertensive.. Pulse regular and within target range for patient.Marland Kitchen Respirations regular, non-labored and within target range.. Temperature is normal and within the target range for the patient.Marland Kitchen Appears in no distress. Cardiovascular Pulses are palpable. Notes Wound exam; area over the right first metatarsal head. Appears to be slightly smaller. Wound bed looks dry and there is some evidence of senescent edges no evidence of infection there is no erythema around the wound Electronic Signature(s) Signed: 10/01/2020 4:22:00 PM By: Baltazar Najjar MD Entered By: Baltazar Najjar on 10/01/2020 09:39:23 -------------------------------------------------------------------------------- Physician Orders Details Patient Name: Date of Service: Gabriel Cirri J. 10/01/2020 9:00 A M Medical Record Number: [id number] Patient Account Number: [id number] Date of Birth/Sex: Treating RN: 07/22/1961 (60 y.o. Tommye Standard Primary Care Provider: PA TEL, Candi Leash Other Clinician: Referring Provider: Treating Provider/Extender: Baltazar Najjar PA TEL, Verdell Carmine in Treatment: 1 Verbal / Phone Orders: No Diagnosis Coding ICD-10 Coding Code Description E11.621 Type 2 diabetes mellitus with foot ulcer L97.512 Non-pressure chronic ulcer of other part of right foot with fat layer exposed E11.42 Type 2 diabetes mellitus with diabetic  polyneuropathy Follow-up Appointments ppointment in 1 week. - plan cast next  week Return A Bathing/ Shower/ Hygiene May shower and wash wound with soap and water. - with dressing changes Off-Loading Open toe surgical shoe to: - right foot to ambulate Other: - felt callous pad in shoe Wound Treatment Wound #1 - Metatarsal head first Wound Laterality: Right Cleanser: Soap and Water 1 x Per Day/30 Days Discharge Instructions: May shower and wash wound with dial antibacterial soap and water prior to dressing change. Prim Dressing: KerraCel Ag Gelling Fiber Dressing, 2x2 in (silver alginate) (Generic) 1 x Per Day/30 Days ary Discharge Instructions: Apply silver alginate to wound bed as instructed Secondary Dressing: Woven Gauze Sponges 2x2 in (Generic) 1 x Per Day/30 Days Discharge Instructions: Apply over primary dressing as directed. Secondary Dressing: Optifoam Non-Adhesive Dressing, 4x4 in (Generic) 1 x Per Day/30 Days Discharge Instructions: Apply over primary dressing cut to make foam donut to help offload Secured With: Conforming Stretch Gauze Bandage, Sterile 2x75 (in/in) (Generic) 1 x Per Day/30 Days Discharge Instructions: Secure with stretch gauze as directed. Secured With: Paper Tape, 2x10 (in/yd) (Generic) 1 x Per Day/30 Days Discharge Instructions: Secure dressing with tape as directed. Electronic Signature(s) Signed: 10/01/2020 4:22:00 PM By: Baltazar Najjar MD Signed: 10/01/2020 4:47:51 PM By: Zenaida Deed RN, BSN Entered By: Zenaida Deed on 10/01/2020 09:18:43 -------------------------------------------------------------------------------- Problem List Details Patient Name: Date of Service: Modesta Messing, Eliott Nine J. 10/01/2020 9:00 A M Medical Record Number: [id number] Patient Account Number: [id number] Date of Birth/Sex: Treating RN: June 11, 1961 (60 y.o. Tommye Standard Primary Care Provider: Other Clinician: PA Durwin Reges Referring Provider: Treating Provider/Extender:  Baltazar Najjar PA TEL, Verdell Carmine in Treatment: 1 Active Problems ICD-10 Encounter Code Description Active Date MDM Diagnosis E11.621 Type 2 diabetes mellitus with foot ulcer 09/24/2020 No Yes L97.512 Non-pressure chronic ulcer of other part of right foot with fat layer exposed 09/24/2020 No Yes E11.42 Type 2 diabetes mellitus with diabetic polyneuropathy 09/24/2020 No Yes Inactive Problems Resolved Problems Electronic Signature(s) Signed: 10/01/2020 4:22:00 PM By: Baltazar Najjar MD Entered By: Baltazar Najjar on 10/01/2020 09:36:53 -------------------------------------------------------------------------------- Progress Note Details Patient Name: Date of Service: Modesta Messing, Eliott Nine J. 10/01/2020 9:00 A M Medical Record Number: [id number] Patient Account Number: [id number] Date of Birth/Sex: Treating RN: [birth year] (60 y.o. Tommye Standard Primary Care Provider: PA TEL, Candi Leash Other Clinician: Referring Provider: Treating Provider/Extender: Baltazar Najjar PA TEL, Verdell Carmine in Treatment: 1 Subjective History of Present Illness (HPI) Admission 09/24/2020 This is a 60 year old woman with type 2 diabetes poorly controlled and polyneuropathy. She has been been dealing with an area on her right first plantar metatarsal head for about 2 months. She has been following at [facility] using Santyl and callus debridements. 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 She has been using Santyl for 2 months she has had x-rays done in the podiatrist office there was apparently nothing noted although I am not able to review this. She has not been on antibiotics. Looking through [facility] health link she has had wounds on both feet in the past for which she is followed with Dr.Petery at [facility]. Past medical history includes type 2 diabetes with most recent hemoglobin A1c of 12.4,  diabetic peripheral neuropathy, hypertension, hyperlipidemia, lichen simplex chronicus ABI in our clinic was 0.91 on the right. She is a non-smoker 2/4; this is a patient with a neuropathic wound  in the setting of type 2 diabetes on her right first metatarsal head. She is not working currently normally works at [employer]. She said she can hold out to the end of the month. The wound measures slightly smaller today we use silver alginate. She did not tolerate a forefoot offloading boot we have her in a surgical shoe with felt offloading Objective Constitutional Patient is hypertensive.. Pulse regular and within target range for patient.Marland Kitchen Respirations regular, non-labored and within target range.. Temperature is normal and within the target range for the patient.Marland Kitchen Appears in no distress. Vitals Time Taken: 8:54 AM, Height: 68 in, Weight: 200 lbs, BMI: 30.4, Temperature: 98 F, Pulse: 102 bpm, Respiratory Rate: 17 breaths/min, Blood Pressure: 187/78 mmHg, Capillary Blood Glucose: 63 mg/dl. Cardiovascular Pulses are palpable. General Notes: Wound exam; area over the right first metatarsal head. Appears to be slightly smaller. Wound bed looks dry and there is some evidence of senescent edges no evidence of infection there is no erythema around the wound Integumentary (Hair, Skin) Wound #1 status is Open. Original cause of wound was Gradually Appeared. The wound is located on the Right Metatarsal head first. The wound measures 0.8cm length x 0.5cm width x 0.2cm depth; 0.314cm^2 area and 0.063cm^3 volume. There is Fat Layer (Subcutaneous Tissue) exposed. There is no tunneling or undermining noted. There is a medium amount of serosanguineous drainage noted. The wound margin is thickened. There is large (67-100%) pink granulation within the wound bed. There is a small (1-33%) amount of necrotic tissue within the wound bed including Adherent Slough. Assessment Active Problems ICD-10 Type 2 diabetes  mellitus with foot ulcer Non-pressure chronic ulcer of other part of right foot with fat layer exposed Type 2 diabetes mellitus with diabetic polyneuropathy Plan Follow-up Appointments: Return Appointment in 1 week. - plan cast next week Bathing/ Shower/ Hygiene: May shower and wash wound with soap and water. - with dressing changes Off-Loading: Open toe surgical shoe to: - right foot to ambulate Other: - felt callous pad in shoe WOUND #1: - Metatarsal head first Wound Laterality: Right Cleanser: Soap and Water 1 x Per Day/30 Days Discharge Instructions: May shower and wash wound with dial antibacterial soap and water prior to dressing change. Prim Dressing: KerraCel Ag Gelling Fiber Dressing, 2x2 in (silver alginate) (Generic) 1 x Per Day/30 Days ary Discharge Instructions: Apply silver alginate to wound bed as instructed Secondary Dressing: Woven Gauze Sponges 2x2 in (Generic) 1 x Per Day/30 Days Discharge Instructions: Apply over primary dressing as directed. Secondary Dressing: Optifoam Non-Adhesive Dressing, 4x4 in (Generic) 1 x Per Day/30 Days Discharge Instructions: Apply over primary dressing cut to make foam donut to help offload Secured With: Conforming Stretch Gauze Bandage, Sterile 2x75 (in/in) (Generic) 1 x Per Day/30 Days Discharge Instructions: Secure with stretch gauze as directed. Secured With: Paper T ape, 2x10 (in/yd) (Generic) 1 x Per Day/30 Days Discharge Instructions: Secure dressing with tape as directed. 1. Wound measures slightly smaller therefore I have not altered anything 2. Next week if the wound looks as though it stalled I am going to put her in a total contact cast. I am slightly worried about her imbalance but we will see how this goes perhaps with silver collagen. 3. If the wound measures smaller than all of this may not be necessary 4. Senescent edges may require debridement next week Electronic Signature(s) Signed: 10/01/2020 4:22:00 PM By: Linton Ham MD Entered By: Linton Ham on 10/01/2020 09:41:18 -------------------------------------------------------------------------------- SuperBill Details Patient Name: Date of Service:  [name] 10/01/2020 Medical Record Number: [id number] Patient Account Number: [id number] Date of Birth/Sex: Treating RN: 1961-06-27 (60 y.o. [name], [name] Primary Care Provider: PA TEL, Darel Hong Other Clinician: Referring Provider: Treating Provider/Extender: Linton Ham PA TEL, Ena Dawley in Treatment: 1 Diagnosis Coding ICD-10 Codes Code Description E11.621 Type 2 diabetes mellitus with foot ulcer L97.512 Non-pressure chronic ulcer of other part of right foot with fat layer exposed E11.42 Type 2 diabetes mellitus with diabetic polyneuropathy Facility Procedures CPT4 Code: [id number] Description: 99213 - WOUND CARE VISIT-LEV 3 EST PT Modifier: Quantity: 1 Physician Procedures : CPT4 Code Description Modifier [id number] - WC PHYS LEVEL 3 - EST PT ICD-10 Diagnosis Description E11.621 Type 2 diabetes mellitus with foot ulcer L97.512 Non-pressure chronic ulcer of other part of right foot with fat layer exposed E11.42 Type 2  diabetes mellitus with diabetic polyneuropathy Quantity: 1 Electronic Signature(s) Signed: 10/01/2020 4:22:00 PM By: Linton Ham MD Entered By: Linton Ham on 10/01/2020 09:41:39

## 2020-10-04 NOTE — Progress Notes (Signed)
Nicole Golden ([id number]) Visit Report for 10/01/2020 Arrival Information Details Patient Name: Date of Service: Nicole Golden 10/01/2020 9:00 A M Medical Record Number: [id number] Patient Account Number: [id number] Date of Birth/Sex: Treating RN: 05/31/1961 (60 y.o. Tonita Phoenix, Lauren Primary Care Sachiko Methot: PA TEL, Darel Hong Other Clinician: Referring Claudio Mondry: Treating Rahm Minix/Extender: Linton Ham PA TEL, Ena Dawley in Treatment: 1 Visit Information History Since Last Visit Added or deleted any medications: No Patient Arrived: Ambulatory Any new allergies or adverse reactions: No Arrival Time: 08:54 Had a fall or experienced change in No Accompanied By: self activities of daily living that may affect Transfer Assistance: None risk of falls: Patient Identification Verified: Yes Signs or symptoms of abuse/neglect since last visito No Secondary Verification Process Completed: Yes Hospitalized since last visit: No Patient Requires Transmission-Based Precautions: No Implantable device outside of the clinic excluding No Patient Has Alerts: No cellular tissue based products placed in the center since last visit: Has Dressing in Place as Prescribed: Yes Pain Present Now: No Electronic Signature(s) Signed: 10/04/2020 9:09:54 AM By: Rhae Hammock RN Entered By: Rhae Hammock on 10/01/2020 08:54:54 -------------------------------------------------------------------------------- Clinic Level of Care Assessment Details Patient Name: Date of Service: Nicole Jester J. 10/01/2020 9:00 A M Medical Record Number: [id number] Patient Account Number: [id number] Date of Birth/Sex: Treating RN: 03-24-1961 (60 y.o. [name] Primary Care Ralyn Stlaurent: PA TEL, Darel Hong Other Clinician: Referring Joyleen Haselton: Treating Jeremih Dearmas/Extender: Linton Ham PA TEL, Ena Dawley in Treatment: 1 Clinic Level of Care Assessment Items TOOL 4 Quantity Score []  - 0 Use when only an  EandM is performed on FOLLOW-UP visit ASSESSMENTS - Nursing Assessment / Reassessment X- 1 10 Reassessment of Co-morbidities (includes updates in patient status) X- 1 5 Reassessment of Adherence to Treatment Plan ASSESSMENTS - Wound and Skin A ssessment / Reassessment X - Simple Wound Assessment / Reassessment - one wound 1 5 []  - 0 Complex Wound Assessment / Reassessment - multiple wounds []  - 0 Dermatologic / Skin Assessment (not related to wound area) ASSESSMENTS - Focused Assessment []  - 0 Circumferential Edema Measurements - multi extremities []  - 0 Nutritional Assessment / Counseling / Intervention X- 1 5 Lower Extremity Assessment (monofilament, tuning fork, pulses) []  - 0 Peripheral Arterial Disease Assessment (using hand held doppler) ASSESSMENTS - Ostomy and/or Continence Assessment and Care []  - 0 Incontinence Assessment and Management []  - 0 Ostomy Care Assessment and Management (repouching, etc.) PROCESS - Coordination of Care X - Simple Patient / Family Education for ongoing care 1 15 []  - 0 Complex (extensive) Patient / Family Education for ongoing care X- 1 10 Staff obtains Programmer, systems, Records, T Results / Process Orders est []  - 0 Staff telephones HHA, Nursing Homes / Clarify orders / etc []  - 0 Routine Transfer to another Facility (non-emergent condition) []  - 0 Routine Hospital Admission (non-emergent condition) []  - 0 New Admissions / Biomedical engineer / Ordering NPWT Apligraf, etc. , []  - 0 Emergency Hospital Admission (emergent condition) X- 1 10 Simple Discharge Coordination []  - 0 Complex (extensive) Discharge Coordination PROCESS - Special Needs []  - 0 Pediatric / Minor Patient Management []  - 0 Isolation Patient Management []  - 0 Hearing / Language / Visual special needs []  - 0 Assessment of Community assistance (transportation, D/C planning, etc.) []  - 0 Additional assistance / Altered mentation []  - 0 Support Surface(s)  Assessment (bed, cushion, seat, etc.) INTERVENTIONS - Wound Cleansing / Measurement X - Simple Wound Cleansing - one wound  1 5 []  - 0 Complex Wound Cleansing - multiple wounds X- 1 5 Wound Imaging (photographs - any number of wounds) []  - 0 Wound Tracing (instead of photographs) X- 1 5 Simple Wound Measurement - one wound []  - 0 Complex Wound Measurement - multiple wounds INTERVENTIONS - Wound Dressings X - Small Wound Dressing one or multiple wounds 1 10 []  - 0 Medium Wound Dressing one or multiple wounds []  - 0 Large Wound Dressing one or multiple wounds X- 1 5 Application of Medications - topical []  - 0 Application of Medications - injection INTERVENTIONS - Miscellaneous []  - 0 External ear exam []  - 0 Specimen Collection (cultures, biopsies, blood, body fluids, etc.) []  - 0 Specimen(s) / Culture(s) sent or taken to Lab for analysis []  - 0 Patient Transfer (multiple staff / Civil Service fast streamer / Similar devices) []  - 0 Simple Staple / Suture removal (25 or less) []  - 0 Complex Staple / Suture removal (26 or more) []  - 0 Hypo / Hyperglycemic Management (close monitor of Blood Glucose) []  - 0 Ankle / Brachial Index (ABI) - do not check if billed separately X- 1 5 Vital Signs Has the patient been seen at the hospital within the last three years: Yes Total Score: 95 Level Of Care: New/Established - Level 3 Electronic Signature(s) Signed: 10/01/2020 4:47:51 PM By: Baruch Gouty RN, BSN Entered By: Baruch Gouty on 10/01/2020 09:15:45 -------------------------------------------------------------------------------- Encounter Discharge Information Details Patient Name: Date of Service: Nicole Golden, Nicole Burns J. 10/01/2020 9:00 A M Medical Record Number: [id number] Patient Account Number: [id number] Date of Birth/Sex: Treating RN: September 23, 1960 (60 y.o. [name] Primary Care Tyr Franca: PA TEL, Darel Hong Other Clinician: Referring Lauri Till: Treating Dreyson Mishkin/Extender: Linton Ham PA TEL, Ena Dawley in Treatment: 1 Encounter Discharge Information Items Discharge Condition: Stable Ambulatory Status: Ambulatory Discharge Destination: Home Transportation: Private Auto Accompanied By: self Schedule Follow-up Appointment: Yes Clinical Summary of Care: Electronic Signature(s) Signed: 10/01/2020 4:56:10 PM By: Deon Pilling Entered By: Deon Pilling on 10/01/2020 09:42:04 -------------------------------------------------------------------------------- Lower Extremity Assessment Details Patient Name: Date of Service: Nicole Jester J. 10/01/2020 9:00 A M Medical Record Number: [id number] Patient Account Number: [id number] Date of Birth/Sex: Treating RN: August 03, 1961 (60 y.o. Tonita Phoenix, Lauren Primary Care Chukwudi Ewen: PA TEL, Darel Hong Other Clinician: Referring Taishawn Smaldone: Treating Aster Screws/Extender: Linton Ham PA TEL, Ena Dawley in Treatment: 1 Edema Assessment Assessed: [Left: No] [Right: No] Edema: [Left: Ye] [Right: s] Calf Left: Right: Point of Measurement: 34 cm From Medial Instep 33.5 cm Ankle Left: Right: Point of Measurement: 11 cm From Medial Instep 23 cm Vascular Assessment Pulses: Dorsalis Pedis Palpable: [Right:Yes] Electronic Signature(s) Signed: 10/04/2020 9:09:54 AM By: Rhae Hammock RN Entered By: Rhae Hammock on 10/01/2020 08:59:10 -------------------------------------------------------------------------------- Multi Wound Chart Details Patient Name: Date of Service: Nicole Golden, Nicole Burns J. 10/01/2020 9:00 A M Medical Record Number: [id number] Patient Account Number: [id number] Date of Birth/Sex: Treating RN: 15-Oct-1960 (60 y.o. [name] Primary Care Cheryln Balcom: PA TEL, Darel Hong Other Clinician: Referring Taevin Mcferran: Treating Dashanae Longfield/Extender: Linton Ham PA TEL, Ena Dawley in Treatment: 1 Vital Signs Height(in): 68 Capillary Blood Glucose(mg/dl): 50 Weight(lbs): 200 Pulse(bpm): 102 Body Mass Index(BMI):  30 Blood Pressure(mmHg): 187/78 Temperature(F): 98 Respiratory Rate(breaths/min): 17 Photos: [1:No Photos Right Metatarsal head first] [N/A:N/A N/A] Wound Location: [1:Gradually Appeared] [N/A:N/A] Wounding Event: [1:Diabetic Wound/Ulcer of the Lower] [N/A:N/A] Primary Etiology: [1:Extremity Hypertension, Type II Diabetes,] [N/A:N/A] Comorbid History: [1:Neuropathy 06/28/2020] [N/A:N/A] Date Acquired: [1:1] [N/A:N/A] Weeks of Treatment: [1:Open] [N/A:N/A] Wound  Status: [1:0.8x0.5x0.2] [N/A:N/A] Measurements L x W x D (cm) [1:0.314] [N/A:N/A] A (cm) : rea [1:0.063] [N/A:N/A] Volume (cm) : [1:0.00%] [N/A:N/A] % Reduction in A rea: [1:79.90%] [N/A:N/A] % Reduction in Volume: [1:Grade 2] [N/A:N/A] Classification: [1:Medium] [N/A:N/A] Exudate A mount: [1:Serosanguineous] [N/A:N/A] Exudate Type: [1:red, brown] [N/A:N/A] Exudate Color: [1:Thickened] [N/A:N/A] Wound Margin: [1:Large (67-100%)] [N/A:N/A] Granulation A mount: [1:Pink] [N/A:N/A] Granulation Quality: [1:Small (1-33%)] [N/A:N/A] Necrotic A mount: [1:Fat Layer (Subcutaneous Tissue): Yes N/A] Exposed Structures: [1:Fascia: No Tendon: No Muscle: No Joint: No Bone: No None] [N/A:N/A] Treatment Notes Electronic Signature(s) Signed: 10/01/2020 4:22:00 PM By: Linton Ham MD Signed: 10/01/2020 4:47:51 PM By: Baruch Gouty RN, BSN Entered By: Linton Ham on 10/01/2020 09:37:02 -------------------------------------------------------------------------------- Multi-Disciplinary Care Plan Details Patient Name: Date of Service: Nicole Golden, Nicole Burns J. 10/01/2020 9:00 A M Medical Record Number: [id number] Patient Account Number: [id number] Date of Birth/Sex: Treating RN: [birth year] (60 y.o. [name] Primary Care Zahi Plaskett: PA TEL, Darel Hong Other Clinician: Referring Zarahi Fuerst: Treating Yatzari Jonsson/Extender: Linton Ham PA TEL, Ena Dawley in Treatment: 1 Active Inactive Nutrition Nursing Diagnoses: Impaired glucose  control: actual or potential Potential for alteratiion in Nutrition/Potential for imbalanced nutrition Goals: Patient/caregiver will maintain therapeutic glucose control Date Initiated: 09/24/2020 Target Resolution Date: 10/22/2020 Goal Status: Active Interventions: Assess HgA1c results as ordered upon admission and as needed Assess patient nutrition upon admission and as needed per policy Provide education on elevated blood sugars and impact on wound healing Treatment Activities: Patient referred to Primary Care Physician for further nutritional evaluation : 09/24/2020 Notes: Wound/Skin Impairment Nursing Diagnoses: Impaired tissue integrity Knowledge deficit related to ulceration/compromised skin integrity Goals: Patient/caregiver will verbalize understanding of skin care regimen Date Initiated: 09/24/2020 Target Resolution Date: 10/22/2020 Goal Status: Active Ulcer/skin breakdown will have a volume reduction of 30% by week 4 Date Initiated: 09/24/2020 Target Resolution Date: 10/22/2020 Goal Status: Active Interventions: Assess patient/caregiver ability to obtain necessary supplies Assess patient/caregiver ability to perform ulcer/skin care regimen upon admission and as needed Assess ulceration(s) every visit Provide education on ulcer and skin care Treatment Activities: Skin care regimen initiated : 09/24/2020 Topical wound management initiated : 09/24/2020 Notes: Electronic Signature(s) Signed: 10/01/2020 4:47:51 PM By: Baruch Gouty RN, BSN Entered By: Baruch Gouty on 10/01/2020 09:14:11 -------------------------------------------------------------------------------- Pain Assessment Details Patient Name: Date of Service: Nicole Jester J. 10/01/2020 9:00 A M Medical Record Number: [id number] Patient Account Number: [id number] Date of Birth/Sex: Treating RN: 10-15-1960 (60 y.o. Tonita Phoenix, Lauren Primary Care Jenniah Bhavsar: PA TEL, Darel Hong Other Clinician: Referring  Shenita Trego: Treating Addalie Calles/Extender: Linton Ham PA TEL, Ena Dawley in Treatment: 1 Active Problems Location of Pain Severity and Description of Pain Patient Has Paino No Site Locations Rate the pain. Current Pain Level: 0 Pain Management and Medication Current Pain Management: Electronic Signature(s) Signed: 10/04/2020 9:09:54 AM By: Rhae Hammock RN Entered By: Rhae Hammock on 10/01/2020 08:58:12 -------------------------------------------------------------------------------- Patient/Caregiver Education Details Patient Name: Date of Service: Nicole Golden 2/4/2022andnbsp9:00 A M Medical Record Number: [id number] Patient Account Number: [id number] Date of Birth/Gender: Treating RN: 1961/04/10 (60 y.o. [name] Primary Care Physician: PA TEL, Darel Hong Other Clinician: Referring Physician: Treating Physician/Extender: Linton Ham PA TEL, Ena Dawley in Treatment: 1 Education Assessment Education Provided To: Patient Education Topics Provided Elevated Blood Sugar/ Impact on Healing: Methods: Explain/Verbal Responses: Reinforcements needed, State content correctly Offloading: Methods: Explain/Verbal Responses: Reinforcements needed, State content correctly Wound/Skin Impairment: Methods: Explain/Verbal Responses: Reinforcements needed, State content correctly Electronic Signature(s) Signed: 10/01/2020 4:47:51 PM  By: Baruch Gouty RN, BSN Entered By: Baruch Gouty on 10/01/2020 09:14:40 -------------------------------------------------------------------------------- Wound Assessment Details Patient Name: Date of Service: Nicole Jester J. 10/01/2020 9:00 A M Medical Record Number: [id number] Patient Account Number: [id number] Date of Birth/Sex: Treating RN: December 19, 1960 (60 y.o. Tonita Phoenix, Lauren Primary Care Novalynn Branaman: PA TEL, Darel Hong Other Clinician: Referring Maanvi Lecompte: Treating Harnoor Reta/Extender: Linton Ham PA TEL, Ena Dawley in Treatment: 1 Wound Status Wound Number: 1 Primary Etiology: Diabetic Wound/Ulcer of the Lower Extremity Wound Location: Right Metatarsal head first Wound Status: Open Wounding Event: Gradually Appeared Comorbid History: Hypertension, Type II Diabetes, Neuropathy Date Acquired: 06/28/2020 Weeks Of Treatment: 1 Clustered Wound: No Photos Photo Uploaded By: Mikeal Hawthorne on 10/01/2020 15:41:24 Wound Measurements Length: (cm) 0.8 Width: (cm) 0.5 Depth: (cm) 0.2 Area: (cm) 0.314 Volume: (cm) 0.063 % Reduction in Area: 0% % Reduction in Volume: 79.9% Epithelialization: None Tunneling: No Undermining: No Wound Description Classification: Grade 2 Wound Margin: Thickened Exudate Amount: Medium Exudate Type: Serosanguineous Exudate Color: red, brown Foul Odor After Cleansing: No Slough/Fibrino Yes Wound Bed Granulation Amount: Large (67-100%) Exposed Structure Granulation Quality: Pink Fascia Exposed: No Necrotic Amount: Small (1-33%) Fat Layer (Subcutaneous Tissue) Exposed: Yes Necrotic Quality: Adherent Slough Tendon Exposed: No Muscle Exposed: No Joint Exposed: No Bone Exposed: No Treatment Notes Wound #1 (Metatarsal head first) Wound Laterality: Right Cleanser Soap and Water Discharge Instruction: May shower and wash wound with dial antibacterial soap and water prior to dressing change. Peri-Wound Care Topical Primary Dressing KerraCel Ag Gelling Fiber Dressing, 2x2 in (silver alginate) Discharge Instruction: Apply silver alginate to wound bed as instructed Secondary Dressing Woven Gauze Sponges 2x2 in Discharge Instruction: Apply over primary dressing as directed. Optifoam Non-Adhesive Dressing, 4x4 in Discharge Instruction: Apply over primary dressing cut to make foam donut to help offload Secured With Conforming Stretch Gauze Bandage, Sterile 2x75 (in/in) Discharge Instruction: Secure with stretch gauze as directed. Paper Tape, 2x10  (in/yd) Discharge Instruction: Secure dressing with tape as directed. Compression Wrap Compression Stockings Add-Ons Electronic Signature(s) Signed: 10/04/2020 9:09:54 AM By: Rhae Hammock RN Entered By: Rhae Hammock on 10/01/2020 08:58:44 -------------------------------------------------------------------------------- Vitals Details Patient Name: Date of Service: Nicole Golden, Nicole Burns J. 10/01/2020 9:00 A M Medical Record Number: [id number] Patient Account Number: [id number] Date of Birth/Sex: Treating RN: 02/14/1961 (60 y.o. Tonita Phoenix, Lauren Primary Care Rozlynn Lippold: PA TEL, Darel Hong Other Clinician: Referring Viola Kinnick: Treating Joniece Smotherman/Extender: Linton Ham PA TEL, Ena Dawley in Treatment: 1 Vital Signs Time Taken: 08:54 Temperature (F): 98 Height (in): 68 Pulse (bpm): 102 Weight (lbs): 200 Respiratory Rate (breaths/min): 17 Body Mass Index (BMI): 30.4 Blood Pressure (mmHg): 187/78 Capillary Blood Glucose (mg/dl): 63 Reference Range: 80 - 120 mg / dl Electronic Signature(s) Signed: 10/04/2020 9:09:54 AM By: Rhae Hammock RN Entered By: Rhae Hammock on 10/01/2020 08:55:40

## 2020-10-08 ENCOUNTER — Encounter (HOSPITAL_BASED_OUTPATIENT_CLINIC_OR_DEPARTMENT_OTHER): Payer: Medicare HMO | Admitting: Internal Medicine

## 2020-10-08 ENCOUNTER — Other Ambulatory Visit: Payer: Self-pay

## 2020-10-08 DIAGNOSIS — E11621 Type 2 diabetes mellitus with foot ulcer: Secondary | ICD-10-CM | POA: Diagnosis not present

## 2020-10-08 NOTE — Progress Notes (Signed)
Nicole Golden, Nicole Golden ([id number]) Visit Report for 10/08/2020 HPI Details Patient Name: Date of Service: Nicole Golden 10/08/2020 11:00 A M Medical Record Number: [id number] Patient Account Number: [id number] Date of Birth/Sex: Treating RN: [birth year] (60 y.o. [name] Primary Care Provider: PA TEL, Darel Hong Other Clinician: Referring Provider: Treating Provider/Extender: Linton Ham PA TEL, Ena Dawley in Treatment: 2 History of Present Illness HPI Description: Admission 09/24/2020 This is a 60 year old woman with type 2 diabetes poorly controlled and polyneuropathy. She has been been dealing with an area on her right first plantar metatarsal head for about 2 months. She has been following at [facility] using Santyl and callus debridements. 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 She has been using Santyl for 2 months she has had x-rays done in the podiatrist office there was apparently nothing noted although I am not able to review this. She has not been on antibiotics. Looking through [facility] health link she has had wounds on both feet in the past for which she is followed with Dr.Petery at [facility]. Past medical history includes type 2 diabetes with most recent hemoglobin A1c of 12.4, diabetic peripheral neuropathy, hypertension, hyperlipidemia, lichen simplex chronicus ABI in our clinic was 0.91 on the right. 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 She is not working currently normally works at [employer]. She said she can hold out to the end of the month. The wound measures slightly smaller today we use silver alginate. She did not tolerate a forefoot offloading boot we have her in a surgical shoe with felt offloading 2/11; right first metatarsal head the  wound looks clean although there is some depth surrounding thick skin and callus. I did not debride this. 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 She did not tolerate a forefoot off loader because of balance issues Electronic Signature(s) Signed: 10/08/2020 5:36:32 PM By: Linton Ham MD Entered By: Linton Ham on 10/08/2020 13:07:52 -------------------------------------------------------------------------------- Physical Exam Details Patient Name: Date of Service: Nicole Jester J. 10/08/2020 11:00 A M Medical Record Number: [id number] Patient Account Number: [id number] Date of Birth/Sex: Treating RN: 10-Oct-1960 (60 y.o. [name] Primary Care Provider: PA TEL, Darel Hong Other Clinician: Referring Provider: Treating Provider/Extender: Linton Ham PA TEL, Ena Dawley in Treatment: 2 Constitutional Patient is hypertensive.. Pulse regular and within target range for patient.Marland Kitchen Respirations regular, non-labored and within target range.. Temperature is normal and within the target range for the patient.Marland Kitchen Appears in no distress. Cardiovascular Pedal pulses palpable. Notes Wound exam; the right first metatarsal head appears to be slightly smaller but still with some depth. The base of this looks healthy there is no undermining at the bottom. No surrounding infection Electronic Signature(s) Signed: 10/08/2020 5:36:32 PM By: Linton Ham MD Signed: 10/08/2020 5:36:32 PM By: Linton Ham MD Entered By: Linton Ham on 10/08/2020 13:09:06 -------------------------------------------------------------------------------- Physician Orders Details Patient Name: Date of Service: Nicole Jester J. 10/08/2020 11:00 A M Medical Record Number: [id number] Patient Account Number: [id number] Date of Birth/Sex: Treating RN: 15-Jul-1961 (60 y.o. [name] Primary Care Provider: PA TEL, Darel Hong Other  Clinician: Referring Provider: Treating Provider/Extender: Linton Ham  PA TEL, NILA Y Weeks in Treatment: 2 Verbal / Phone Orders: No Diagnosis Coding ICD-10 Coding Code Description E11.621 Type 2 diabetes mellitus with foot ulcer L97.512 Non-pressure chronic ulcer of other part of right foot with fat layer exposed E11.42 Type 2 diabetes mellitus with diabetic polyneuropathy Follow-up Appointments ppointment in 1 week. - MD Return A ppointment in: - Mon 2/14 for initial cast change Return A Bathing/ Shower/ Hygiene May shower with protection but do not get wound dressing(s) wet. Off-Loading Total Contact Cast to Right Lower Extremity Wound Treatment Wound #1 - Metatarsal head first Wound Laterality: Right Prim Dressing: Promogran Prisma Matrix, 4.34 (sq in) (silver collagen) 1 x Per Day/30 Days ary Discharge Instructions: Moisten collagen with saline or hydrogel Secondary Dressing: Woven Gauze Sponges 2x2 in (Generic) 1 x Per Day/30 Days Discharge Instructions: Apply over primary dressing as directed. Secondary Dressing: Optifoam Non-Adhesive Dressing, 4x4 in (Generic) 1 x Per Day/30 Days Discharge Instructions: Apply over primary dressing cut to make foam donut to help offload Secured With: 53M Medipore H Soft Cloth Surgical T 4 x 2 (in/yd) 1 x Per Day/30 Days ape Discharge Instructions: Secure dressing with tape as directed. Electronic Signature(s) Signed: 10/08/2020 5:36:32 PM By: Linton Ham MD Signed: 10/08/2020 5:49:38 PM By: Baruch Gouty RN, BSN Entered By: Baruch Gouty on 10/08/2020 11:37:58 -------------------------------------------------------------------------------- Problem List Details Patient Name: Date of Service: Nicole Golden, Nicole Burns J. 10/08/2020 11:00 A M Medical Record Number: [id number] Patient Account Number: [id number] Date of Birth/Sex: Treating RN: 07/02/1961 (60 y.o. [name] Primary Care Provider: PA TEL, Darel Hong Other  Clinician: Referring Provider: Treating Provider/Extender: Linton Ham PA TEL, Ena Dawley in Treatment: 2 Active Problems ICD-10 Encounter Code Description Active Date MDM Diagnosis E11.621 Type 2 diabetes mellitus with foot ulcer 09/24/2020 No Yes L97.512 Non-pressure chronic ulcer of other part of right foot with fat layer exposed 09/24/2020 No Yes E11.42 Type 2 diabetes mellitus with diabetic polyneuropathy 09/24/2020 No Yes Inactive Problems Resolved Problems Electronic Signature(s) Signed: 10/08/2020 5:36:32 PM By: Linton Ham MD Entered By: Linton Ham on 10/08/2020 12:56:24 -------------------------------------------------------------------------------- Progress Note Details Patient Name: Date of Service: Nicole Jester J. 10/08/2020 11:00 A M Medical Record Number: [id number] Patient Account Number: [id number] Date of Birth/Sex: Treating RN: [birth year] (60 y.o. [name] Primary Care Provider: PA TEL, Darel Hong Other Clinician: Referring Provider: Treating Provider/Extender: Linton Ham PA TEL, Ena Dawley in Treatment: 2 Subjective History of Present Illness (HPI) Admission 09/24/2020 This is a 60 year old woman with type 2 diabetes poorly controlled and polyneuropathy. She has been been dealing with an area on her right first plantar metatarsal head for about 2 months. She has been following at [facility] using Santyl and callus debridements. 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 She has been using Santyl for 2 months she has had x-rays done in the podiatrist office there was apparently nothing noted although I am not able to review this. She has not been on antibiotics. Looking through [facility] health link she has had wounds on both feet in the past for which she is followed with Dr.Petery at [facility]. Past medical history  includes type 2 diabetes with most recent hemoglobin A1c of 12.4, diabetic peripheral neuropathy, hypertension, hyperlipidemia, lichen simplex chronicus ABI in our clinic was 0.91 on the right. She is a non-smoker  2/4; this is a patient with a neuropathic wound in the setting of type 2 diabetes on her right first metatarsal head. She is not working currently normally works at [employer]. She said she can hold out to the end of the month. The wound measures slightly smaller today we use silver alginate. She did not tolerate a forefoot offloading boot we have her in a surgical shoe with felt offloading 2/11; right first metatarsal head the wound looks clean although there is some depth surrounding thick skin and callus. I did not debride this. 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 She did not tolerate a forefoot off loader because of balance issues Objective Constitutional Patient is hypertensive.. Pulse regular and within target range for patient.Marland Kitchen Respirations regular, non-labored and within target range.. Temperature is normal and within the target range for the patient.Marland Kitchen Appears in no distress. Vitals Time Taken: 10:55 AM, Height: 68 in, Weight: 200 lbs, BMI: 30.4, Temperature: 98.3 F, Pulse: 105 bpm, Respiratory Rate: 17 breaths/min, Blood Pressure: 162/81 mmHg, Capillary Blood Glucose: 100 mg/dl. Cardiovascular Pedal pulses palpable. General Notes: Wound exam; the right first metatarsal head appears to be slightly smaller but still with some depth. The base of this looks healthy there is no undermining at the bottom. No surrounding infection Integumentary (Hair, Skin) Wound #1 status is Open. Original cause of wound was Gradually Appeared. The wound is located on the Right Metatarsal head first. The wound measures 0.8cm length x 0.5cm width x 0.5cm depth; 0.314cm^2 area and 0.157cm^3 volume. There is Fat Layer (Subcutaneous Tissue)  exposed. There is no tunneling or undermining noted. There is a medium amount of serosanguineous drainage noted. The wound margin is thickened. There is large (67-100%) pink granulation within the wound bed. There is a small (1-33%) amount of necrotic tissue within the wound bed including Adherent Slough. Assessment Active Problems ICD-10 Type 2 diabetes mellitus with foot ulcer Non-pressure chronic ulcer of other part of right foot with fat layer exposed Type 2 diabetes mellitus with diabetic polyneuropathy Procedures Wound #1 Pre-procedure diagnosis of Wound #1 is a Diabetic Wound/Ulcer of the Lower Extremity located on the Right Metatarsal head first . There was a T Contact otal Cast Procedure by Ricard Dillon., MD. Post procedure Diagnosis Wound #1: Same as Pre-Procedure Plan Follow-up Appointments: Return Appointment in 1 week. - MD Return Appointment in: - Mon 2/14 for initial cast change Bathing/ Shower/ Hygiene: May shower with protection but do not get wound dressing(s) wet. Off-Loading: T Contact Cast to Right Lower Extremity otal WOUND #1: - Metatarsal head first Wound Laterality: Right Prim Dressing: Promogran Prisma Matrix, 4.34 (sq in) (silver collagen) 1 x Per Day/30 Days ary Discharge Instructions: Moisten collagen with saline or hydrogel Secondary Dressing: Woven Gauze Sponges 2x2 in (Generic) 1 x Per Day/30 Days Discharge Instructions: Apply over primary dressing as directed. Secondary Dressing: Optifoam Non-Adhesive Dressing, 4x4 in (Generic) 1 x Per Day/30 Days Discharge Instructions: Apply over primary dressing cut to make foam donut to help offload Secured With: 32M Medipore H Soft Cloth Surgical T 4 x 2 (in/yd) 1 x Per Day/30 Days ape Discharge Instructions: Secure dressing with tape as directed. 1. I change her dressing to silver collagen 2. After some thought I elected not to debride this 3. T contact cast otal 4. It is possible I will need to take  the edges of this wound down before we replace the cast next  week. Electronic Signature(s) Signed: 10/08/2020 5:36:32 PM By: Linton Ham MD Entered By: Linton Ham on 10/08/2020 13:10:07 -------------------------------------------------------------------------------- Total Contact Cast Details Patient Name: Date of Service: Nicole Jester J. 10/08/2020 11:00 A M Medical Record Number: [id number] Patient Account Number: [id number] Date of Birth/Sex: Treating RN: [birth year] (60 y.o. [name] Primary Care Provider: PA TEL, Darel Hong Other Clinician: Referring Provider: Treating Provider/Extender: Linton Ham PA TEL, Ena Dawley in Treatment: 2 T Contact Cast Applied for Wound Assessment: otal Wound #1 Right Metatarsal head first Performed By: Physician Ricard Dillon., MD Post Procedure Diagnosis Same as Pre-procedure Electronic Signature(s) Signed: 10/08/2020 5:36:32 PM By: Linton Ham MD Entered By: Linton Ham on 10/08/2020 12:56:43 -------------------------------------------------------------------------------- SuperBill Details Patient Name: Date of Service: Nicole Golden, Nicole Burns J. 10/08/2020 Medical Record Number: [id number] Patient Account Number: [id number] Date of Birth/Sex: Treating RN: [birth year] (60 y.o. [name] Primary Care Provider: PA TEL, Darel Hong Other Clinician: Referring Provider: Treating Provider/Extender: Linton Ham PA TEL, Ena Dawley in Treatment: 2 Diagnosis Coding ICD-10 Codes Code Description E11.621 Type 2 diabetes mellitus with foot ulcer L97.512 Non-pressure chronic ulcer of other part of right foot with fat layer exposed E11.42 Type 2 diabetes mellitus with diabetic polyneuropathy Facility Procedures CPT4 Code: [id number] Description: [phone] - APPLY TOTAL CONTACT LEG CAST ICD-10 Diagnosis Description L97.512 Non-pressure chronic ulcer of other part of right foot with fat layer exposed Modifier: Quantity:  1 Physician Procedures : CPT4 Code Description Modifier [id number] - WC PHYS LEVEL 3 - EST PT ICD-10 Diagnosis Description E11.621 Type 2 diabetes mellitus with foot ulcer L97.512 Non-pressure chronic ulcer of other part of right foot with fat layer exposed E11.42 Type 2  diabetes mellitus with diabetic polyneuropathy Quantity: 1 : [id number] - WC PHYS APPLY TOTAL CONTACT CAST ICD-10 Diagnosis Description L97.512 Non-pressure chronic ulcer of other part of right foot with fat layer exposed Quantity: 1 Electronic Signature(s) Signed: 10/08/2020 5:36:32 PM By: Linton Ham MD Entered By: Linton Ham on 10/08/2020 13:10:31

## 2020-10-08 NOTE — Progress Notes (Signed)
PAM, Nicole Golden ([id number]) Visit Report for 10/08/2020 Arrival Information Details Patient Name: Date of Service: Nicole Golden 10/08/2020 11:00 A M Medical Record Number: [id number] Patient Account Number: [id number] Date of Birth/Sex: Treating RN: 1961/04/18 (60 y.o. [name] Primary Care Farmer Mccahill: PA TEL, Darel Hong Other Clinician: Referring Lannie Yusuf: Treating Hannibal Skalla/Extender: Linton Ham PA TEL, Ena Dawley in Treatment: 2 Visit Information History Since Last Visit Added or deleted any medications: No Patient Arrived: Ambulatory Any new allergies or adverse reactions: No Arrival Time: 10:54 Had a fall or experienced change in No Accompanied By: self activities of daily living that may affect Transfer Assistance: None risk of falls: Patient Identification Verified: Yes Signs or symptoms of abuse/neglect since last visito No Secondary Verification Process Completed: Yes Hospitalized since last visit: No Patient Requires Transmission-Based Precautions: No Implantable device outside of the clinic excluding No Patient Has Alerts: No cellular tissue based products placed in the center since last visit: Has Dressing in Place as Prescribed: Yes Pain Present Now: No Electronic Signature(s) Signed: 10/08/2020 1:25:35 PM By: Sandre Kitty Entered By: Sandre Kitty on 10/08/2020 10:55:20 -------------------------------------------------------------------------------- Encounter Discharge Information Details Patient Name: Date of Service: Nicole Golden, Nicole Golden. 10/08/2020 11:00 A M Medical Record Number: [id number] Patient Account Number: [id number] Date of Birth/Sex: Treating RN: 30-Mar-1961 (60 y.o. Nicole Golden Primary Care Barby Colvard: PA TEL, Darel Hong Other Clinician: Referring Nicole Golden: Treating Carmela Piechowski/Extender: Linton Ham PA TEL, Ena Dawley in Treatment: 2 Encounter Discharge Information Items Discharge Condition: Stable Ambulatory Status:  Ambulatory Discharge Destination: Home Transportation: Private Auto Accompanied By: self Schedule Follow-up Appointment: Yes Clinical Summary of Care: Patient Declined Electronic Signature(s) Signed: 10/08/2020 5:40:25 PM By: Rhae Hammock RN Entered By: Rhae Hammock on 10/08/2020 11:50:29 -------------------------------------------------------------------------------- Lower Extremity Assessment Details Patient Name: Date of Service: Nicole Golden. 10/08/2020 11:00 A M Medical Record Number: [id number] Patient Account Number: [id number] Date of Birth/Sex: Treating RN: [birth year] (60 y.o. Tonita Phoenix, Lauren Primary Care Kambrea Carrasco: PA TEL, Darel Hong Other Clinician: Referring Nicole Golden: Treating Alaysiah Browder/Extender: Linton Ham PA TEL, Ena Dawley in Treatment: 2 Edema Assessment Assessed: [Left: No] [Right: No] Edema: [Left: Ye] [Right: s] Calf Left: Right: Point of Measurement: 34 cm From Medial Instep 33.5 cm Ankle Left: Right: Point of Measurement: 11 cm From Medial Instep 23 cm Vascular Assessment Pulses: Dorsalis Pedis Palpable: [Right:Yes] Posterior Tibial Palpable: [Right:Yes] Electronic Signature(s) Signed: 10/08/2020 5:40:25 PM By: Rhae Hammock RN Entered By: Rhae Hammock on 10/08/2020 11:01:55 -------------------------------------------------------------------------------- Multi Wound Chart Details Patient Name: Date of Service: Nicole Golden, Nicole Golden. 10/08/2020 11:00 A M Medical Record Number: [id number] Patient Account Number: [id number] Date of Birth/Sex: Treating RN: 24-Apr-1961 (60 y.o. [name] Primary Care Jaidev Sanger: PA TEL, Darel Hong Other Clinician: Referring Nicole Golden: Treating Tennyson Kallen/Extender: Linton Ham PA TEL, Ena Dawley in Treatment: 2 Vital Signs Height(in): 68 Capillary Blood Glucose(mg/dl): 100 Weight(lbs): 200 Pulse(bpm): 105 Body Mass Index(BMI): 30 Blood Pressure(mmHg): 162/81 Temperature(F):  98.3 Respiratory Rate(breaths/min): 17 Photos: [1:No Photos Right Metatarsal head first] [N/A:N/A N/A] Wound Location: [1:Gradually Appeared] [N/A:N/A] Wounding Event: [1:Diabetic Wound/Ulcer of the Lower] [N/A:N/A] Primary Etiology: [1:Extremity Hypertension, Type II Diabetes,] [N/A:N/A] Comorbid History: [1:Neuropathy 06/28/2020] [N/A:N/A] Date Acquired: [1:2] [N/A:N/A] Weeks of Treatment: [1:Open] [N/A:N/A] Wound Status: [1:0.8x0.5x0.5] [N/A:N/A] Measurements L x W x D (cm) [1:0.314] [N/A:N/A] A (cm) : rea [1:0.157] [N/A:N/A] Volume (cm) : [1:0.00%] [N/A:N/A] % Reduction in A rea: [1:50.00%] [N/A:N/A] % Reduction in Volume: [1:Grade 2] [N/A:N/A] Classification: [1:Medium] [N/A:N/A]  Exudate A mount: [1:Serosanguineous] [N/A:N/A] Exudate Type: [1:red, brown] [N/A:N/A] Exudate Color: [1:Thickened] [N/A:N/A] Wound Margin: [1:Large (67-100%)] [N/A:N/A] Granulation A mount: [1:Pink] [N/A:N/A] Granulation Quality: [1:Small (1-33%)] [N/A:N/A] Necrotic A mount: [1:Fat Layer (Subcutaneous Tissue): Yes N/A] Exposed Structures: [1:Fascia: No Tendon: No Muscle: No Joint: No Bone: No None] [N/A:N/A] Epithelialization: [1:T Contact Cast otal] [N/A:N/A] Treatment Notes Wound #1 (Metatarsal head first) Wound Laterality: Right Cleanser Peri-Wound Care Topical Primary Dressing Promogran Prisma Matrix, 4.34 (sq in) (silver collagen) Discharge Instruction: Moisten collagen with saline or hydrogel Secondary Dressing Woven Gauze Sponges 2x2 in Discharge Instruction: Apply over primary dressing as directed. Optifoam Non-Adhesive Dressing, 4x4 in Discharge Instruction: Apply over primary dressing cut to make foam donut to help offload Secured With 42M Medipore H Soft Cloth Surgical T 4 x 2 (in/yd) ape Discharge Instruction: Secure dressing with tape as directed. Compression Wrap Compression Stockings Add-Ons Electronic Signature(s) Signed: 10/08/2020 5:36:32 PM By: Linton Ham  MD Signed: 10/08/2020 5:49:38 PM By: Baruch Gouty RN, BSN Entered By: Linton Ham on 10/08/2020 12:56:31 -------------------------------------------------------------------------------- Multi-Disciplinary Care Plan Details Patient Name: Date of Service: Nicole Golden, Nicole Golden. 10/08/2020 11:00 A M Medical Record Number: [id number] Patient Account Number: [id number] Date of Birth/Sex: Treating RN: Apr 09, 1961 (60 y.o. [name] Primary Care Cleo Villamizar: PA TEL, Darel Hong Other Clinician: Referring Mackynzie Woolford: Treating Jacquita Mulhearn/Extender: Linton Ham PA TEL, Ena Dawley in Treatment: 2 Active Inactive Nutrition Nursing Diagnoses: Impaired glucose control: actual or potential Potential for alteratiion in Nutrition/Potential for imbalanced nutrition Goals: Patient/caregiver will maintain therapeutic glucose control Date Initiated: 09/24/2020 Target Resolution Date: 10/22/2020 Goal Status: Active Interventions: Assess HgA1c results as ordered upon admission and as needed Assess patient nutrition upon admission and as needed per policy Provide education on elevated blood sugars and impact on wound healing Treatment Activities: Patient referred to Primary Care Physician for further nutritional evaluation : 09/24/2020 Notes: Wound/Skin Impairment Nursing Diagnoses: Impaired tissue integrity Knowledge deficit related to ulceration/compromised skin integrity Goals: Patient/caregiver will verbalize understanding of skin care regimen Date Initiated: 09/24/2020 Target Resolution Date: 10/22/2020 Goal Status: Active Ulcer/skin breakdown will have a volume reduction of 30% by week 4 Date Initiated: 09/24/2020 Target Resolution Date: 10/22/2020 Goal Status: Active Interventions: Assess patient/caregiver ability to obtain necessary supplies Assess patient/caregiver ability to perform ulcer/skin care regimen upon admission and as needed Assess ulceration(s) every visit Provide education  on ulcer and skin care Treatment Activities: Skin care regimen initiated : 09/24/2020 Topical wound management initiated : 09/24/2020 Notes: Electronic Signature(s) Signed: 10/08/2020 5:49:38 PM By: Baruch Gouty RN, BSN Entered By: Baruch Gouty on 10/08/2020 11:33:41 -------------------------------------------------------------------------------- Pain Assessment Details Patient Name: Date of Service: Nicole Golden. 10/08/2020 11:00 A M Medical Record Number: [id number] Patient Account Number: [id number] Date of Birth/Sex: Treating RN: 06-08-1961 (60 y.o. [name] Primary Care Pilar Corrales: PA TEL, Darel Hong Other Clinician: Referring Cincere Zorn: Treating Alton Tremblay/Extender: Linton Ham PA TEL, Ena Dawley in Treatment: 2 Active Problems Location of Pain Severity and Description of Pain Patient Has Paino No Site Locations Pain Management and Medication Current Pain Management: Electronic Signature(s) Signed: 10/08/2020 1:25:35 PM By: Sandre Kitty Signed: 10/08/2020 5:49:38 PM By: Baruch Gouty RN, BSN Entered By: Sandre Kitty on 10/08/2020 10:56:13 -------------------------------------------------------------------------------- Patient/Caregiver Education Details Patient Name: Date of Service: A USTIN, RO SLYN Golden. 2/11/2022andnbsp11:00 A M Medical Record Number: [id number] Patient Account Number: [id number] Date of Birth/Gender: Treating RN: 1961/08/10 (60 y.o. [name] Primary Care Physician: PA TEL, Darel Hong Other Clinician: Referring Physician: Treating Physician/Extender:  Linton Ham PA TEL, Ena Dawley in Treatment: 2 Education Assessment Education Provided To: Patient Education Topics Provided Elevated Blood Sugar/ Impact on Healing: Methods: Explain/Verbal Responses: Reinforcements needed, State content correctly Offloading: Methods: Explain/Verbal Responses: Reinforcements needed, State content correctly Wound/Skin  Impairment: Methods: Explain/Verbal Responses: Reinforcements needed, State content correctly Electronic Signature(s) Signed: 10/08/2020 5:49:38 PM By: Baruch Gouty RN, BSN Entered By: Baruch Gouty on 10/08/2020 11:34:10 -------------------------------------------------------------------------------- Wound Assessment Details Patient Name: Date of Service: Nicole Golden. 10/08/2020 11:00 A M Medical Record Number: [id number] Patient Account Number: [id number] Date of Birth/Sex: Treating RN: 03-24-1961 (60 y.o. Tonita Phoenix, Lauren Primary Care Yaasir Menken: PA TEL, Darel Hong Other Clinician: Referring Sarabelle Genson: Treating Aidden Markovic/Extender: Linton Ham PA TEL, Ena Dawley in Treatment: 2 Wound Status Wound Number: 1 Primary Etiology: Diabetic Wound/Ulcer of the Lower Extremity Wound Location: Right Metatarsal head first Wound Status: Open Wounding Event: Gradually Appeared Comorbid History: Hypertension, Type II Diabetes, Neuropathy Date Acquired: 06/28/2020 Weeks Of Treatment: 2 Clustered Wound: No Wound Measurements Length: (cm) 0.8 Width: (cm) 0.5 Depth: (cm) 0.5 Area: (cm) 0.314 Volume: (cm) 0.157 % Reduction in Area: 0% % Reduction in Volume: 50% Epithelialization: None Tunneling: No Undermining: No Wound Description Classification: Grade 2 Wound Margin: Thickened Exudate Amount: Medium Exudate Type: Serosanguineous Exudate Color: red, brown Foul Odor After Cleansing: No Slough/Fibrino Yes Wound Bed Granulation Amount: Large (67-100%) Exposed Structure Granulation Quality: Pink Fascia Exposed: No Necrotic Amount: Small (1-33%) Fat Layer (Subcutaneous Tissue) Exposed: Yes Necrotic Quality: Adherent Slough Tendon Exposed: No Muscle Exposed: No Joint Exposed: No Bone Exposed: No Treatment Notes Wound #1 (Metatarsal head first) Wound Laterality: Right Cleanser Peri-Wound Care Topical Primary Dressing Promogran Prisma Matrix, 4.34 (sq in) (silver  collagen) Discharge Instruction: Moisten collagen with saline or hydrogel Secondary Dressing Woven Gauze Sponges 2x2 in Discharge Instruction: Apply over primary dressing as directed. Optifoam Non-Adhesive Dressing, 4x4 in Discharge Instruction: Apply over primary dressing cut to make foam donut to help offload Secured With 48M Medipore H Soft Cloth Surgical T 4 x 2 (in/yd) ape Discharge Instruction: Secure dressing with tape as directed. Compression Wrap Compression Stockings Add-Ons Electronic Signature(s) Signed: 10/08/2020 5:40:25 PM By: Rhae Hammock RN Entered By: Rhae Hammock on 10/08/2020 11:02:40 -------------------------------------------------------------------------------- Vitals Details Patient Name: Date of Service: Nicole Golden, Nicole Golden. 10/08/2020 11:00 A M Medical Record Number: [id number] Patient Account Number: [id number] Date of Birth/Sex: Treating RN: February 04, 1961 (60 y.o. [name] Primary Care Yuto Cajuste: PA TEL, Darel Hong Other Clinician: Referring Milea Klink: Treating Jude Naclerio/Extender: Linton Ham PA TEL, Ena Dawley in Treatment: 2 Vital Signs Time Taken: 10:55 Temperature (F): 98.3 Height (in): 68 Pulse (bpm): 105 Weight (lbs): 200 Respiratory Rate (breaths/min): 17 Body Mass Index (BMI): 30.4 Blood Pressure (mmHg): 162/81 Capillary Blood Glucose (mg/dl): 100 Reference Range: 80 - 120 mg / dl Electronic Signature(s) Signed: 10/08/2020 1:25:35 PM By: Sandre Kitty Entered By: Sandre Kitty on 10/08/2020 10:56:05

## 2020-10-11 ENCOUNTER — Other Ambulatory Visit: Payer: Self-pay

## 2020-10-11 ENCOUNTER — Encounter (HOSPITAL_BASED_OUTPATIENT_CLINIC_OR_DEPARTMENT_OTHER): Payer: Medicare HMO | Admitting: Internal Medicine

## 2020-10-11 DIAGNOSIS — E11621 Type 2 diabetes mellitus with foot ulcer: Secondary | ICD-10-CM | POA: Diagnosis not present

## 2020-10-11 NOTE — Progress Notes (Signed)
WILLADEEN, COLANTUONO ([id number]) Visit Report for 10/11/2020 HPI Details Patient Name: Date of Service: Nicole Golden 10/11/2020 1:45 PM Medical Record Number: [id number] Patient Account Number: [id number] Date of Birth/Sex: Treating RN: 1960-09-15 (60 y.o. [name] Primary Care Provider: PA TEL, Darel Hong Other Clinician: Referring Provider: Treating Provider/Extender: Linton Ham PA TEL, Ena Dawley in Treatment: 2 History of Present Illness HPI Description: Admission 09/24/2020 This is a 60 year old woman with type 2 diabetes poorly controlled and polyneuropathy. She has been been dealing with an area on her right first plantar metatarsal head for about 2 months. She has been following at [facility] using Santyl and callus debridements. 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 She has been using Santyl for 2 months she has had x-rays done in the podiatrist office there was apparently nothing noted although I am not able to review this. She has not been on antibiotics. Looking through [facility] health link she has had wounds on both feet in the past for which she is followed with Dr.Petery at [facility]. Past medical history includes type 2 diabetes with most recent hemoglobin A1c of 12.4, diabetic peripheral neuropathy, hypertension, hyperlipidemia, lichen simplex chronicus ABI in our clinic was 0.91 on the right. 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 She is not working currently normally works at [employer]. She said she can hold out to the end of the month. The wound measures slightly smaller today we use silver alginate. She did not tolerate a forefoot offloading boot we have her in a surgical shoe with felt offloading 2/11; right first metatarsal head the  wound looks clean although there is some depth surrounding thick skin and callus. I did not debride this. 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 She did not tolerate a forefoot off loader because of balance issues 2/14; follow-up total contact cast reapplied other than slippery on her hardwood floors she had no specific concerns. We did not look at the wound today Electronic Signature(s) Signed: 10/11/2020 5:51:45 PM By: Linton Ham MD Entered By: Linton Ham on 10/11/2020 15:22:13 -------------------------------------------------------------------------------- Physical Exam Details Patient Name: Date of Service: Nicole Golden. 10/11/2020 1:45 PM Medical Record Number: [id number] Patient Account Number: [id number] Date of Birth/Sex: Treating RN: 1961/06/04 (60 y.o. [name] Primary Care Provider: PA TEL, Darel Hong Other Clinician: Referring Provider: Treating Provider/Extender: Linton Ham PA TEL, Ena Dawley in Treatment: 2 Notes Wound exam; there is no examination of the wound done today we will see her in 1 week's time. T contact cast reapplied in the standard fashion otal Electronic Signature(s) Signed: 10/11/2020 5:51:45 PM By: Linton Ham MD Entered By: Linton Ham on 10/11/2020 15:22:38 -------------------------------------------------------------------------------- Physician Orders Details Patient Name: Date of Service: [name] 10/11/2020 1:45 PM Medical Record Number: [id number] Patient Account Number: [id number] Date of Birth/Sex: Treating RN: [birth year] (60 y.o. [name] Primary Care Provider: PA TEL, Darel Hong Other Clinician: Referring Provider: Treating Provider/Extender: Linton Ham PA TEL, Ena Dawley in Treatment: 2 Verbal / Phone Orders: No Diagnosis Coding ICD-10 Coding Code Description E11.621 Type 2 diabetes mellitus with foot ulcer L97.512  Non-pressure chronic ulcer of other part of right foot with fat layer exposed E11.42 Type 2 diabetes mellitus with diabetic polyneuropathy Follow-up Appointments ppointment in 1 week. - MD Return A Bathing/ Shower/ Hygiene May shower with protection but do not get wound dressing(s) wet. Off-Loading Total Contact Cast to Right Lower Extremity Wound Treatment Wound #1 - Metatarsal head first Wound Laterality: Right Prim Dressing: Promogran Prisma Matrix, 4.34 (sq in) (silver collagen) 1 x Per Day/30 Days ary Discharge Instructions: Moisten collagen with saline or hydrogel Secondary Dressing: Woven Gauze Sponges 2x2 in (Generic) 1 x Per Day/30 Days Discharge Instructions: Apply over primary dressing as directed. Secondary Dressing: Optifoam Non-Adhesive Dressing, 4x4 in (Generic) 1 x Per Day/30 Days Discharge Instructions: Apply over primary dressing cut to make foam donut to help offload Secured With: 76M Medipore H Soft Cloth Surgical T 4 x 2 (in/yd) 1 x Per Day/30 Days ape Discharge Instructions: Secure dressing with tape as directed. Electronic Signature(s) Signed: 10/11/2020 5:25:06 PM By: Deon Pilling Signed: 10/11/2020 5:51:45 PM By: Linton Ham MD Entered By: Deon Pilling on 10/11/2020 14:44:33 -------------------------------------------------------------------------------- Problem List Details Patient Name: Date of Service: [name], [name] 10/11/2020 1:45 PM Medical Record Number: [id number] Patient Account Number: [id number] Date of Birth/Sex: Treating RN: 1960/11/03 (60 y.o. [name] Primary Care Provider: PA TEL, Darel Hong Other Clinician: Referring Provider: Treating Provider/Extender: Linton Ham PA TEL, Ena Dawley in Treatment: 2 Active Problems ICD-10 Encounter Code Description Active Date MDM Diagnosis E11.621 Type 2 diabetes mellitus with foot ulcer 09/24/2020 No Yes L97.512 Non-pressure chronic ulcer of other part of right foot with fat  layer exposed 09/24/2020 No Yes E11.42 Type 2 diabetes mellitus with diabetic polyneuropathy 09/24/2020 No Yes Inactive Problems Resolved Problems Electronic Signature(s) Signed: 10/11/2020 5:51:45 PM By: Linton Ham MD Entered By: Linton Ham on 10/11/2020 15:21:17 -------------------------------------------------------------------------------- Progress Note Details Patient Name: Date of Service: [name], [name] 10/11/2020 1:45 PM Medical Record Number: [id number] Patient Account Number: [id number] Date of Birth/Sex: Treating RN: July 12, 1961 (60 y.o. [name] Primary Care Provider: PA TEL, Darel Hong Other Clinician: Referring Provider: Treating Provider/Extender: Linton Ham PA TEL, Ena Dawley in Treatment: 2 Subjective History of Present Illness (HPI) Admission 09/24/2020 This is a 60 year old woman with type 2 diabetes poorly controlled and polyneuropathy. She has been been dealing with an area on her right first plantar metatarsal head for about 2 months. She has been following at [facility] using Santyl and callus debridements. 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 She has been using Santyl for 2 months she has had x-rays done in the podiatrist office there was apparently nothing noted although I am not able to review this. She has not been on antibiotics. Looking through [facility] health link she has had wounds on both feet in the past for which she is followed with Dr.Petery at [facility]. Past medical history includes type 2 diabetes with most recent hemoglobin A1c of 12.4, diabetic peripheral neuropathy, hypertension, hyperlipidemia, lichen simplex chronicus ABI in our clinic was 0.91 on the right. 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 She is  not working currently normally works at [employer]. She said she can hold out to the end of the month.  The wound measures slightly smaller today we use silver alginate. She did not tolerate a forefoot offloading boot we have her in a surgical shoe with felt offloading 2/11; right first metatarsal head the wound looks clean although there is some depth surrounding thick skin and callus. I did not debride this. 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 She did not tolerate a forefoot off loader because of balance issues 2/14; follow-up total contact cast reapplied other than slippery on her hardwood floors she had no specific concerns. We did not look at the wound today Objective Constitutional Vitals Time Taken: 2:14 PM, Height: 68 in, Weight: 200 lbs, BMI: 30.4, Temperature: 98.3 F, Pulse: 87 bpm, Respiratory Rate: 17 breaths/min, Blood Pressure: 183/78 mmHg, Capillary Blood Glucose: 160 mg/dl. Integumentary (Hair, Skin) Wound #1 status is Open. Original cause of wound was Gradually Appeared. The wound is located on the Right Metatarsal head first. The wound measures 0.8cm length x 0.5cm width x 0.5cm depth; 0.314cm^2 area and 0.157cm^3 volume. There is Fat Layer (Subcutaneous Tissue) exposed. There is no tunneling or undermining noted. There is a medium amount of serosanguineous drainage noted. The wound margin is thickened. There is large (67-100%) pink granulation within the wound bed. There is a small (1-33%) amount of necrotic tissue within the wound bed including Adherent Slough. Assessment Active Problems ICD-10 Type 2 diabetes mellitus with foot ulcer Non-pressure chronic ulcer of other part of right foot with fat layer exposed Type 2 diabetes mellitus with diabetic polyneuropathy Procedures Wound #1 Pre-procedure diagnosis of Wound #1 is a Diabetic Wound/Ulcer of the Lower Extremity located on the Right Metatarsal head first .  There was a T Contact otal Cast Procedure by Ricard Dillon., MD. Post procedure Diagnosis Wound #1: Same as Pre-Procedure Plan Follow-up Appointments: Return Appointment in 1 week. - MD Bathing/ Shower/ Hygiene: May shower with protection but do not get wound dressing(s) wet. Off-Loading: T Contact Cast to Right Lower Extremity otal WOUND #1: - Metatarsal head first Wound Laterality: Right Prim Dressing: Promogran Prisma Matrix, 4.34 (sq in) (silver collagen) 1 x Per Day/30 Days ary Discharge Instructions: Moisten collagen with saline or hydrogel Secondary Dressing: Woven Gauze Sponges 2x2 in (Generic) 1 x Per Day/30 Days Discharge Instructions: Apply over primary dressing as directed. Secondary Dressing: Optifoam Non-Adhesive Dressing, 4x4 in (Generic) 1 x Per Day/30 Days Discharge Instructions: Apply over primary dressing cut to make foam donut to help offload Secured With: 69M Medipore H Soft Cloth Surgical T 4 x 2 (in/yd) 1 x Per Day/30 Days ape Discharge Instructions: Secure dressing with tape as directed. 1. T contact cast change otal Electronic Signature(s) Signed: 10/11/2020 5:51:45 PM By: Linton Ham MD Entered By: Linton Ham on 10/11/2020 15:23:06 -------------------------------------------------------------------------------- Total Contact Cast Details Patient Name: Date of Service: [name] 10/11/2020 1:45 PM Medical Record Number: [id number] Patient Account Number: [id number] Date of Birth/Sex: Treating RN: 24-May-1961 (60 y.o. [name] Primary Care Provider: PA TEL, Darel Hong Other Clinician: Referring Provider: Treating Provider/Extender: Linton Ham PA TEL, Ena Dawley in Treatment: 2 T Contact Cast Applied for Wound Assessment: otal Wound #1 Right Metatarsal head first Performed By: Physician Ricard Dillon., MD Post Procedure Diagnosis Same as Pre-procedure Electronic Signature(s) Signed: 10/11/2020 5:51:45 PM By:  Linton Ham MD Entered By: Linton Ham on 10/11/2020 15:21:46 -------------------------------------------------------------------------------- SuperBill Details Patient Name: Date of Service: [name], [name] 10/11/2020 Medical Record  Number: [id number] Patient Account Number: [id number] Date of Birth/Sex: Treating RN: 1961/05/03 (60 y.o. [name] Primary Care Provider: PA TEL, Darel Hong Other Clinician: Referring Provider: Treating Provider/Extender: Linton Ham PA TEL, Ena Dawley in Treatment: 2 Diagnosis Coding ICD-10 Codes Code Description E11.621 Type 2 diabetes mellitus with foot ulcer L97.512 Non-pressure chronic ulcer of other part of right foot with fat layer exposed E11.42 Type 2 diabetes mellitus with diabetic polyneuropathy Facility Procedures CPT4 Code: [id number] Description: [phone] - APPLY TOTAL CONTACT LEG CAST ICD-10 Diagnosis Description L97.512 Non-pressure chronic ulcer of other part of right foot with fat layer exposed Modifier: Quantity: 1 Physician Procedures : CPT4 Code Description Modifier [id number] - WC PHYS APPLY TOTAL CONTACT CAST ICD-10 Diagnosis Description L97.512 Non-pressure chronic ulcer of other part of right foot with fat layer exposed Quantity: 1 Electronic Signature(s) Signed: 10/11/2020 5:51:45 PM By: Linton Ham MD Entered By: Linton Ham on 10/11/2020 15:23:26

## 2020-10-11 NOTE — Progress Notes (Signed)
ABBYGAYLE, HELFAND ([id number]) Visit Report for 10/11/2020 Arrival Information Details Patient Name: Date of Service: Nicole Golden 10/11/2020 1:45 PM Medical Record Number: [id number] Patient Account Number: [id number] Date of Birth/Sex: Treating RN: December 15, 1960 (60 y.o. [name] Primary Care Andersyn Fragoso: PA TEL, Darel Hong Other Clinician: Referring Delorean Knutzen: Treating Keyshon Stein/Extender: Linton Ham PA TEL, Ena Dawley in Treatment: 2 Visit Information History Since Last Visit Added or deleted any medications: No Patient Arrived: Wheel Chair Any new allergies or adverse reactions: No Arrival Time: 14:13 Had a fall or experienced change in No Accompanied By: self activities of daily living that may affect Transfer Assistance: None risk of falls: Patient Identification Verified: Yes Signs or symptoms of abuse/neglect since last visito No Secondary Verification Process Completed: Yes Hospitalized since last visit: No Patient Requires Transmission-Based Precautions: No Implantable device outside of the clinic excluding No Patient Has Alerts: No cellular tissue based products placed in the center since last visit: Has Dressing in Place as Prescribed: Yes Pain Present Now: No Electronic Signature(s) Signed: 10/11/2020 2:29:08 PM By: Sandre Kitty Entered By: Sandre Kitty on 10/11/2020 14:14:41 -------------------------------------------------------------------------------- Encounter Discharge Information Details Patient Name: Date of Service: Nicole Golden, Nicole Burns J. 10/11/2020 1:45 PM Medical Record Number: [id number] Patient Account Number: [id number] Date of Birth/Sex: Treating RN: [birth year] (60 y.o. [name] Primary Care Maaliyah Adolph: PA TEL, Darel Hong Other Clinician: Referring Connell Bognar: Treating Marvie Calender/Extender: Linton Ham PA TEL, Ena Dawley in Treatment: 2 Encounter Discharge Information Items Discharge Condition: Stable Ambulatory Status:  Ambulatory Discharge Destination: Home Transportation: Private Auto Accompanied By: self Schedule Follow-up Appointment: Yes Clinical Summary of Care: Electronic Signature(s) Signed: 10/11/2020 5:25:06 PM By: Deon Pilling Entered By: Deon Pilling on 10/11/2020 15:04:14 -------------------------------------------------------------------------------- Lower Extremity Assessment Details Patient Name: Date of Service: Nicole Golden 10/11/2020 1:45 PM Medical Record Number: [id number] Patient Account Number: [id number] Date of Birth/Sex: Treating RN: [birth year] (60 y.o. [name] Primary Care Flynt Breeze: PA TEL, Darel Hong Other Clinician: Referring Modesto Ganoe: Treating Oswin Johal/Extender: Linton Ham PA TEL, Ena Dawley in Treatment: 2 Edema Assessment Assessed: [Left: No] [Right: No] Edema: [Left: Ye] [Right: s] Calf Left: Right: Point of Measurement: 34 cm From Medial Instep 33.5 cm Ankle Left: Right: Point of Measurement: 11 cm From Medial Instep 23 cm Vascular Assessment Pulses: Dorsalis Pedis Palpable: [Right:Yes] Electronic Signature(s) Signed: 10/11/2020 2:29:08 PM By: Sandre Kitty Signed: 10/11/2020 5:50:50 PM By: Levan Hurst RN, BSN Entered By: Sandre Kitty on 10/11/2020 14:15:34 -------------------------------------------------------------------------------- Multi Wound Chart Details Patient Name: Date of Service: Nicole Golden, Nicole Burns J. 10/11/2020 1:45 PM Medical Record Number: [id number] Patient Account Number: [id number] Date of Birth/Sex: Treating RN: 1961/02/07 (60 y.o. [name] Primary Care Deylan Canterbury: PA TEL, Darel Hong Other Clinician: Referring Charon Smedberg: Treating Amarri Satterly/Extender: Linton Ham PA TEL, Ena Dawley in Treatment: 2 Vital Signs Height(in): 68 Capillary Blood Glucose(mg/dl): 160 Weight(lbs): 200 Pulse(bpm): 87 Body Mass Index(BMI): 30 Blood Pressure(mmHg): 183/78 Temperature(F): 98.3 Respiratory  Rate(breaths/min): 17 Photos: [1:No Photos Right Metatarsal head first] [N/A:N/A N/A] Wound Location: [1:Gradually Appeared] [N/A:N/A] Wounding Event: [1:Diabetic Wound/Ulcer of the Lower] [N/A:N/A] Primary Etiology: [1:Extremity Hypertension, Type II Diabetes,] [N/A:N/A] Comorbid History: [1:Neuropathy 06/28/2020] [N/A:N/A] Date Acquired: [1:2] [N/A:N/A] Weeks of Treatment: [1:Open] [N/A:N/A] Wound Status: [1:0.8x0.5x0.5] [N/A:N/A] Measurements L x W x D (cm) [1:0.314] [N/A:N/A] A (cm) : rea [1:0.157] [N/A:N/A] Volume (cm) : [1:0.00%] [N/A:N/A] % Reduction in A rea: [1:50.00%] [N/A:N/A] % Reduction in Volume: [1:Grade 2] [N/A:N/A] Classification: [1:Medium] [N/A:N/A] Exudate A  mount: [1:Serosanguineous] [N/A:N/A] Exudate Type: [1:red, brown] [N/A:N/A] Exudate Color: [1:Thickened] [N/A:N/A] Wound Margin: [1:Large (67-100%)] [N/A:N/A] Granulation A mount: [1:Pink] [N/A:N/A] Granulation Quality: [1:Small (1-33%)] [N/A:N/A] Necrotic A mount: [1:Fat Layer (Subcutaneous Tissue): Yes N/A] Exposed Structures: [1:Fascia: No Tendon: No Muscle: No Joint: No Bone: No None] [N/A:N/A] Epithelialization: [1:T Contact Cast otal] [N/A:N/A] Treatment Notes Wound #1 (Metatarsal head first) Wound Laterality: Right Cleanser Peri-Wound Care Topical Primary Dressing Promogran Prisma Matrix, 4.34 (sq in) (silver collagen) Discharge Instruction: Moisten collagen with saline or hydrogel Secondary Dressing Woven Gauze Sponges 2x2 in Discharge Instruction: Apply over primary dressing as directed. Optifoam Non-Adhesive Dressing, 4x4 in Discharge Instruction: Apply over primary dressing cut to make foam donut to help offload Secured With 21M Medipore H Soft Cloth Surgical T 4 x 2 (in/yd) ape Discharge Instruction: Secure dressing with tape as directed. Compression Wrap Compression Stockings Add-Ons Electronic Signature(s) Signed: 10/11/2020 5:50:50 PM By: Levan Hurst RN, BSN Signed: 10/11/2020  5:51:45 PM By: Linton Ham MD Entered By: Linton Ham on 10/11/2020 15:21:27 -------------------------------------------------------------------------------- [place] Details Patient Name: Date of Service: Nicole Golden, Nicole Burns J. 10/11/2020 1:45 PM Medical Record Number: [id number] Patient Account Number: [id number] Date of Birth/Sex: Treating RN: 10-Jul-1961 (60 y.o. [name] Primary Care Dennard Vezina: PA TEL, Darel Hong Other Clinician: Referring Reginold Beale: Treating Dezirae Service/Extender: Linton Ham PA TEL, Ena Dawley in Treatment: 2 Active Inactive Nutrition Nursing Diagnoses: Impaired glucose control: actual or potential Potential for alteratiion in Nutrition/Potential for imbalanced nutrition Goals: Patient/caregiver will maintain therapeutic glucose control Date Initiated: 09/24/2020 Target Resolution Date: 10/22/2020 Goal Status: Active Interventions: Assess HgA1c results as ordered upon admission and as needed Assess patient nutrition upon admission and as needed per policy Provide education on elevated blood sugars and impact on wound healing Treatment Activities: Patient referred to Primary Care Physician for further nutritional evaluation : 09/24/2020 Notes: Wound/Skin Impairment Nursing Diagnoses: Impaired tissue integrity Knowledge deficit related to ulceration/compromised skin integrity Goals: Patient/caregiver will verbalize understanding of skin care regimen Date Initiated: 09/24/2020 Target Resolution Date: 10/22/2020 Goal Status: Active Ulcer/skin breakdown will have a volume reduction of 30% by week 4 Date Initiated: 09/24/2020 Target Resolution Date: 10/22/2020 Goal Status: Active Interventions: Assess patient/caregiver ability to obtain necessary supplies Assess patient/caregiver ability to perform ulcer/skin care regimen upon admission and as needed Assess ulceration(s) every visit Provide education on ulcer and skin  care Treatment Activities: Skin care regimen initiated : 09/24/2020 Topical wound management initiated : 09/24/2020 Notes: Electronic Signature(s) Signed: 10/11/2020 5:25:06 PM By: Deon Pilling Entered By: Deon Pilling on 10/11/2020 15:04:51 -------------------------------------------------------------------------------- Pain Assessment Details Patient Name: Date of Service: Nicole Jester J. 10/11/2020 1:45 PM Medical Record Number: [id number] Patient Account Number: [id number] Date of Birth/Sex: Treating RN: Nov 17, 1960 (60 y.o. [name] Primary Care Krysti Hickling: PA TEL, Darel Hong Other Clinician: Referring Jaqueline Uber: Treating Azam Gervasi/Extender: Linton Ham PA TEL, Ena Dawley in Treatment: 2 Active Problems Location of Pain Severity and Description of Pain Patient Has Paino No Site Locations Pain Management and Medication Current Pain Management: Electronic Signature(s) Signed: 10/11/2020 2:29:08 PM By: Sandre Kitty Signed: 10/11/2020 5:50:50 PM By: Levan Hurst RN, BSN Entered By: Sandre Kitty on 10/11/2020 14:15:25 -------------------------------------------------------------------------------- Patient/Caregiver Education Details Patient Name: Date of Service: Nicole Golden 2/14/2022andnbsp1:45 PM Medical Record Number: [id number] Patient Account Number: [id number] Date of Birth/Gender: Treating RN: 1960-12-09 (60 y.o. [name], Meta.Reding Primary Care Physician: PA Kristen Loader Other Clinician: Referring Physician: Treating Physician/Extender: Linton Ham PA TEL, Ena Dawley  in Treatment: 2 Education Assessment Education Provided To: Patient Education Topics Provided Wound/Skin Impairment: Handouts: Caring for Your Ulcer Methods: Explain/Verbal Responses: Reinforcements needed Electronic Signature(s) Signed: 10/11/2020 5:25:06 PM By: Deon Pilling Entered By: Deon Pilling on 10/11/2020  15:05:03 -------------------------------------------------------------------------------- Wound Assessment Details Patient Name: Date of Service: Nicole Jester J. 10/11/2020 1:45 PM Medical Record Number: [id number] Patient Account Number: [id number] Date of Birth/Sex: Treating RN: August 27, 1961 (60 y.o. [name] Primary Care Christinamarie Tall: PA TEL, Darel Hong Other Clinician: Referring Deontae Robson: Treating Tritia Endo/Extender: Linton Ham PA TEL, Ena Dawley in Treatment: 2 Wound Status Wound Number: 1 Primary Etiology: Diabetic Wound/Ulcer of the Lower Extremity Wound Location: Right Metatarsal head first Wound Status: Open Wounding Event: Gradually Appeared Comorbid History: Hypertension, Type II Diabetes, Neuropathy Date Acquired: 06/28/2020 Weeks Of Treatment: 2 Clustered Wound: No Wound Measurements Length: (cm) 0.8 Width: (cm) 0.5 Depth: (cm) 0.5 Area: (cm) 0.314 Volume: (cm) 0.157 % Reduction in Area: 0% % Reduction in Volume: 50% Epithelialization: None Tunneling: No Undermining: No Wound Description Classification: Grade 2 Wound Margin: Thickened Exudate Amount: Medium Exudate Type: Serosanguineous Exudate Color: red, brown Foul Odor After Cleansing: No Slough/Fibrino Yes Wound Bed Granulation Amount: Large (67-100%) Exposed Structure Granulation Quality: Pink Fascia Exposed: No Necrotic Amount: Small (1-33%) Fat Layer (Subcutaneous Tissue) Exposed: Yes Necrotic Quality: Adherent Slough Tendon Exposed: No Muscle Exposed: No Joint Exposed: No Bone Exposed: No Treatment Notes Wound #1 (Metatarsal head first) Wound Laterality: Right Cleanser Peri-Wound Care Topical Primary Dressing Promogran Prisma Matrix, 4.34 (sq in) (silver collagen) Discharge Instruction: Moisten collagen with saline or hydrogel Secondary Dressing Woven Gauze Sponges 2x2 in Discharge Instruction: Apply over primary dressing as directed. Optifoam Non-Adhesive Dressing, 4x4  in Discharge Instruction: Apply over primary dressing cut to make foam donut to help offload Secured With 49M Medipore H Soft Cloth Surgical T 4 x 2 (in/yd) ape Discharge Instruction: Secure dressing with tape as directed. Compression Wrap Compression Stockings Add-Ons Electronic Signature(s) Signed: 10/11/2020 5:09:26 PM By: Rhae Hammock RN Signed: 10/11/2020 5:50:50 PM By: Levan Hurst RN, BSN Previous Signature: 10/11/2020 2:29:08 PM Version By: Sandre Kitty Entered By: Rhae Hammock on 10/11/2020 14:36:32 -------------------------------------------------------------------------------- Vitals Details Patient Name: Date of Service: Nicole Golden, Nicole Burns J. 10/11/2020 1:45 PM Medical Record Number: [id number] Patient Account Number: [id number] Date of Birth/Sex: Treating RN: 12-13-1960 (60 y.o. [name] Primary Care Tayte Mcwherter: PA TEL, Darel Hong Other Clinician: Referring Deanda Ruddell: Treating Reiley Keisler/Extender: Linton Ham PA TEL, Ena Dawley in Treatment: 2 Vital Signs Time Taken: 14:14 Temperature (F): 98.3 Height (in): 68 Pulse (bpm): 87 Weight (lbs): 200 Respiratory Rate (breaths/min): 17 Body Mass Index (BMI): 30.4 Blood Pressure (mmHg): 183/78 Capillary Blood Glucose (mg/dl): 160 Reference Range: 80 - 120 mg / dl Electronic Signature(s) Signed: 10/11/2020 2:29:08 PM By: Sandre Kitty Entered By: Sandre Kitty on 10/11/2020 14:15:20

## 2020-10-15 ENCOUNTER — Encounter (HOSPITAL_BASED_OUTPATIENT_CLINIC_OR_DEPARTMENT_OTHER): Payer: Medicare HMO | Admitting: Internal Medicine

## 2020-10-18 ENCOUNTER — Encounter (HOSPITAL_BASED_OUTPATIENT_CLINIC_OR_DEPARTMENT_OTHER): Payer: Medicare HMO | Admitting: Internal Medicine

## 2020-10-18 ENCOUNTER — Other Ambulatory Visit: Payer: Self-pay

## 2020-10-18 DIAGNOSIS — E11621 Type 2 diabetes mellitus with foot ulcer: Secondary | ICD-10-CM | POA: Diagnosis not present

## 2020-10-18 NOTE — Progress Notes (Addendum)
Nicole Golden, Nicole Golden ([id number]) Visit Report for 10/18/2020 Debridement Details Patient Name: Date of Service: Nicole Golden 10/18/2020 1:30 PM Medical Record Number: [id number] Patient Account Number: [id number] Date of Birth/Sex: Treating RN: 04/06/1961 (60 y.o. [name] Primary Care Provider: PA TEL, Darel Hong Other Clinician: Referring Provider: Treating Provider/Extender: Linton Ham PA TEL, Ena Dawley in Treatment: 3 Debridement Performed for Assessment: Wound #1 Right Metatarsal head first Performed By: Physician Ricard Dillon., MD Debridement Type: Debridement Severity of Tissue Pre Debridement: Fat layer exposed Level of Consciousness (Pre-procedure): Awake and Alert Pre-procedure Verification/Time Out Yes - 14:34 Taken: Start Time: 14:34 T Area Debrided (L x W): otal 0.6 (cm) x 0.3 (cm) = 0.18 (cm) Tissue and other material debrided: Viable, Non-Viable, Callus, Subcutaneous Level: Skin/Subcutaneous Tissue Debridement Description: Excisional Instrument: Curette Bleeding: Moderate Hemostasis Achieved: Pressure End Time: 14:35 Procedural Pain: 0 Post Procedural Pain: 0 Response to Treatment: Procedure was tolerated well Level of Consciousness (Post- Awake and Alert procedure): Post Debridement Measurements of Total Wound Length: (cm) 0.6 Width: (cm) 0.3 Depth: (cm) 0.3 Volume: (cm) 0.042 Character of Wound/Ulcer Post Debridement: Improved Severity of Tissue Post Debridement: Fat layer exposed Post Procedure Diagnosis Same as Pre-procedure Electronic Signature(s) Signed: 10/18/2020 5:41:15 PM By: Levan Hurst RN, BSN Signed: 10/18/2020 5:55:48 PM By: Linton Ham MD Entered By: Linton Ham on 10/18/2020 15:33:53 -------------------------------------------------------------------------------- HPI Details Patient Name: Date of Service: Nicole Golden, Nicole Burns J. 10/18/2020 1:30 PM Medical Record Number: [id number] Patient Account Number:  [id number] Date of Birth/Sex: Treating RN: 18-Aug-1961 (60 y.o. [name] Primary Care Provider: PA TEL, Darel Hong Other Clinician: Referring Provider: Treating Provider/Extender: Linton Ham PA TEL, Ena Dawley in Treatment: 3 History of Present Illness HPI Description: Admission 09/24/2020 This is a 60 year old woman with type 2 diabetes poorly controlled and polyneuropathy. She has been been dealing with an area on her right first plantar metatarsal head for about 2 months. She has been following at [facility] using Santyl and callus debridements. 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 She has been using Santyl for 2 months she has had x-rays done in the podiatrist office there was apparently nothing noted although I am not able to review this. She has not been on antibiotics. Looking through [facility] health link she has had wounds on both feet in the past for which she is followed with Dr.Petery at [facility]. Past medical history includes type 2 diabetes with most recent hemoglobin A1c of 12.4, diabetic peripheral neuropathy, hypertension, hyperlipidemia, lichen simplex chronicus ABI in our clinic was 0.91 on the right. 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 She is not working currently normally works at [employer]. She said she can hold out to the end of the month. The wound measures slightly smaller today we use silver alginate. She did not tolerate a forefoot offloading boot we have her in a surgical shoe with felt offloading 2/11; right first metatarsal head the wound looks clean although there is some depth surrounding thick skin and callus. I did not debride this. 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  She did not tolerate a forefoot off loader because of  balance issues 2/14; follow-up total contact cast reapplied other than slippery on her hardwood floors she had no specific concerns. We did not look at the wound today 2/21; right first metatarsal head. She is tolerating the total contact cast and the wound is measuring slightly smaller. We have been using silver collagen Electronic Signature(s) Signed: 10/18/2020 5:55:48 PM By: Linton Ham MD Entered By: Linton Ham on 10/18/2020 15:34:29 -------------------------------------------------------------------------------- Physical Exam Details Patient Name: Date of Service: Nicole Jester J. 10/18/2020 1:30 PM Medical Record Number: [id number] Patient Account Number: [id number] Date of Birth/Sex: Treating RN: 1961-06-28 (60 y.o. [name] Primary Care Provider: PA TEL, Darel Hong Other Clinician: Referring Provider: Treating Provider/Extender: Linton Ham PA TEL, Ena Dawley in Treatment: 3 Notes Wound exam; the wound itself is small however is some depth and raised skin and subcutaneous tissue around the wound edge I elected to debride this with a #3 curette to see if we can get a viable surface. Epithelialization. Hemostasis with silver nitrate. We then applied a total contact cast Electronic Signature(s) Signed: 10/18/2020 5:55:48 PM By: Linton Ham MD Entered By: Linton Ham on 10/18/2020 15:35:07 -------------------------------------------------------------------------------- Physician Orders Details Patient Name: Date of Service: Nicole Jester J. 10/18/2020 1:30 PM Medical Record Number: [id number] Patient Account Number: [id number] Date of Birth/Sex: Treating RN: 1961/01/11 (60 y.o. [name] Primary Care Provider: PA TEL, Darel Hong Other Clinician: Referring Provider: Treating Provider/Extender: Linton Ham PA TEL, Ena Dawley in Treatment: 3 Verbal / Phone Orders: No Diagnosis  Coding ICD-10 Coding Code Description E11.621 Type 2 diabetes mellitus with foot ulcer L97.512 Non-pressure chronic ulcer of other part of right foot with fat layer exposed E11.42 Type 2 diabetes mellitus with diabetic polyneuropathy Follow-up Appointments Return Appointment in 1 week. Bathing/ Shower/ Hygiene May shower with protection but do not get wound dressing(s) wet. Off-Loading Total Contact Cast to Right Lower Extremity Wound Treatment Wound #1 - Metatarsal head first Wound Laterality: Right Cleanser: Wound Cleanser 1 x Per Day/30 Days Discharge Instructions: Cleanse the wound with wound cleanser prior to applying a clean dressing using gauze sponges, not tissue or cotton balls. Peri-Wound Care: Sween Lotion (Moisturizing lotion) 1 x Per Day/30 Days Discharge Instructions: Apply moisturizing lotion as directed Prim Dressing: Promogran Prisma Matrix, 4.34 (sq in) (silver collagen) 1 x Per Day/30 Days ary Discharge Instructions: Moisten collagen with saline or hydrogel Secondary Dressing: Woven Gauze Sponges 2x2 in (Generic) 1 x Per Day/30 Days Discharge Instructions: Apply over primary dressing as directed. Secondary Dressing: Optifoam Non-Adhesive Dressing, 4x4 in (Generic) 1 x Per Day/30 Days Discharge Instructions: Apply over primary dressing cut to make foam donut to help offload Secured With: 74M Medipore H Soft Cloth Surgical T 4 x 2 (in/yd) 1 x Per Day/30 Days ape Discharge Instructions: Secure dressing with tape as directed. Electronic Signature(s) Signed: 10/18/2020 5:41:15 PM By: Levan Hurst RN, BSN Signed: 10/18/2020 5:55:48 PM By: Linton Ham MD Entered By: Levan Hurst on 10/18/2020 14:36:40 -------------------------------------------------------------------------------- Problem List Details Patient Name: Date of Service: Nicole Golden, Nicole Burns J. 10/18/2020 1:30 PM Medical Record Number: [id number] Patient Account Number: [id number] Date of  Birth/Sex: Treating RN: 11/06/1960 (60 y.o. [name] Primary Care Provider: PA TEL, Darel Hong Other Clinician: Referring Provider: Treating Provider/Extender: Linton Ham PA TEL, Ena Dawley in Treatment: 3 Active Problems ICD-10 Encounter Code Description Active Date MDM Diagnosis E11.621 Type 2 diabetes mellitus with foot ulcer 09/24/2020 No Yes L97.512 Non-pressure chronic ulcer of other part  of right foot with fat layer exposed 09/24/2020 No Yes E11.42 Type 2 diabetes mellitus with diabetic polyneuropathy 09/24/2020 No Yes Inactive Problems Resolved Problems Electronic Signature(s) Signed: 10/26/2020 5:59:48 PM By: Linton Ham MD Previous Signature: 10/18/2020 5:55:48 PM Version By: Linton Ham MD Entered By: Linton Ham on 10/25/2020 14:51:38 -------------------------------------------------------------------------------- Progress Note Details Patient Name: Date of Service: Nicole Golden, Nicole Burns J. 10/18/2020 1:30 PM Medical Record Number: [id number] Patient Account Number: [id number] Date of Birth/Sex: Treating RN: 1961-06-04 (60 y.o. [name] Primary Care Provider: PA TEL, Darel Hong Other Clinician: Referring Provider: Treating Provider/Extender: Linton Ham PA TEL, Ena Dawley in Treatment: 3 Subjective History of Present Illness (HPI) Admission 09/24/2020 This is a 60 year old woman with type 2 diabetes poorly controlled and polyneuropathy. She has been been dealing with an area on her right first plantar metatarsal head for about 2 months. She has been following at [facility] using Santyl and callus debridements. 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 She has been using Santyl for 2 months she has had x-rays done in the podiatrist office there was apparently nothing noted although I am not able to review this. She has not been on  antibiotics. Looking through [facility] health link she has had wounds on both feet in the past for which she is followed with Dr.Petery at [facility]. Past medical history includes type 2 diabetes with most recent hemoglobin A1c of 12.4, diabetic peripheral neuropathy, hypertension, hyperlipidemia, lichen simplex chronicus ABI in our clinic was 0.91 on the right. 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 She is not working currently normally works at [employer]. She said she can hold out to the end of the month. The wound measures slightly smaller today we use silver alginate. She did not tolerate a forefoot offloading boot we have her in a surgical shoe with felt offloading 2/11; right first metatarsal head the wound looks clean although there is some depth surrounding thick skin and callus. I did not debride this. 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 She did not tolerate a forefoot off loader because of balance issues 2/14; follow-up total contact cast reapplied other than slippery on her hardwood floors she had no specific concerns. We did not look at the wound today 2/21; right first metatarsal head. She is tolerating the total contact cast and the wound is measuring slightly smaller. We have been using silver collagen Objective Constitutional Vitals Time Taken: 1:43 PM, Height: 68 in, Weight: 200 lbs, BMI: 30.4, Temperature: 98.4 F, Pulse: 89 bpm, Respiratory Rate: 17 breaths/min, Blood Pressure: 154/75 mmHg. Integumentary (Hair, Skin) Wound #1 status is Open. Original cause of wound was Gradually Appeared. The date acquired was: 06/28/2020. The wound has been in treatment 3 weeks. The wound is located on the Right Metatarsal head first. The wound measures 0.6cm length x 0.3cm width x 0.3cm depth; 0.141cm^2 area and 0.042cm^3  volume. There is Fat Layer (Subcutaneous Tissue) exposed. There is no tunneling or undermining noted. There is a medium amount of serosanguineous drainage noted. The wound margin is thickened. There is large (67-100%) pink granulation within the wound bed. There is no necrotic tissue within the wound bed.  General Notes: callous to periwound. Assessment Active Problems ICD-10 Type 2 diabetes mellitus with foot ulcer Non-pressure chronic ulcer of other part of right foot with fat layer exposed Type 2 diabetes mellitus with diabetic polyneuropathy Procedures Wound #1 Pre-procedure diagnosis of Wound #1 is a Diabetic Wound/Ulcer of the Lower Extremity located on the Right Metatarsal head first .Severity of Tissue Pre Debridement is: Fat layer exposed. There was a Excisional Skin/Subcutaneous Tissue Debridement with a total area of 0.18 sq cm performed by Ricard Dillon., MD. With the following instrument(s): Curette to remove Viable and Non-Viable tissue/material. Material removed includes Callus and Subcutaneous Tissue and. No specimens were taken. A time out was conducted at 14:34, prior to the start of the procedure. A Moderate amount of bleeding was controlled with Pressure. The procedure was tolerated well with a pain level of 0 throughout and a pain level of 0 following the procedure. Post Debridement Measurements: 0.6cm length x 0.3cm width x 0.3cm depth; 0.042cm^3 volume. Character of Wound/Ulcer Post Debridement is improved. Severity of Tissue Post Debridement is: Fat layer exposed. Post procedure Diagnosis Wound #1: Same as Pre-Procedure Pre-procedure diagnosis of Wound #1 is a Diabetic Wound/Ulcer of the Lower Extremity located on the Right Metatarsal head first . There was a T Contact otal Cast Procedure by Ricard Dillon., MD. Post procedure Diagnosis Wound #1: Same as Pre-Procedure Plan Follow-up Appointments: Return Appointment in 1 week. Bathing/ Shower/ Hygiene: May  shower with protection but do not get wound dressing(s) wet. Off-Loading: T Contact Cast to Right Lower Extremity otal WOUND #1: - Metatarsal head first Wound Laterality: Right Cleanser: Wound Cleanser 1 x Per Day/30 Days Discharge Instructions: Cleanse the wound with wound cleanser prior to applying a clean dressing using gauze sponges, not tissue or cotton balls. Peri-Wound Care: Sween Lotion (Moisturizing lotion) 1 x Per Day/30 Days Discharge Instructions: Apply moisturizing lotion as directed Prim Dressing: Promogran Prisma Matrix, 4.34 (sq in) (silver collagen) 1 x Per Day/30 Days ary Discharge Instructions: Moisten collagen with saline or hydrogel Secondary Dressing: Woven Gauze Sponges 2x2 in (Generic) 1 x Per Day/30 Days Discharge Instructions: Apply over primary dressing as directed. Secondary Dressing: Optifoam Non-Adhesive Dressing, 4x4 in (Generic) 1 x Per Day/30 Days Discharge Instructions: Apply over primary dressing cut to make foam donut to help offload Secured With: 2M Medipore H Soft Cloth Surgical T 4 x 2 (in/yd) 1 x Per Day/30 Days ape Discharge Instructions: Secure dressing with tape as directed. 1. Continued moistened collagen under a total contact cast 2. Hopefully debridement giving an edge for faster healing Electronic Signature(s) Signed: 10/18/2020 5:55:48 PM By: Linton Ham MD Entered By: Linton Ham on 10/18/2020 15:35:45 -------------------------------------------------------------------------------- Total Contact Cast Details Patient Name: Date of Service: Nicole Jester J. 10/18/2020 1:30 PM Medical Record Number: [id number] Patient Account Number: [id number] Date of Birth/Sex: Treating RN: [birth year] (60 y.o. [name] Primary Care Provider: PA TEL, Darel Hong Other Clinician: Referring Provider: Treating Provider/Extender: Linton Ham PA TEL, Ena Dawley in Treatment: 3 T Contact Cast Applied for Wound Assessment: otal Wound #1  Right Metatarsal head first Performed By: Physician Ricard Dillon., MD Post Procedure Diagnosis Same as Pre-procedure Electronic Signature(s) Signed: 10/18/2020 5:55:48 PM By: Linton Ham MD Entered By: Linton Ham on 10/18/2020 15:34:03 -------------------------------------------------------------------------------- SuperBill Details Patient Name: Date of Service: Nicole Golden, Morton Amy 10/18/2020 Medical Record Number: [id number] Patient Account Number: [id number] Date of Birth/Sex: Treating RN: Feb 23, 1961 (60 y.o. [name] Primary Care Provider: PA  TEL, NILA Y Other Clinician: Referring Provider: Treating Provider/Extender: Linton Ham PA TEL, Ena Dawley in Treatment: 3 Diagnosis Coding ICD-10 Codes Code Description E11.621 Type 2 diabetes mellitus with foot ulcer L97.512 Non-pressure chronic ulcer of other part of right foot with fat layer exposed E11.42 Type 2 diabetes mellitus with diabetic polyneuropathy Facility Procedures CPT4 Code: [id number] Description: 51102 - DEB SUBQ TISSUE 20 SQ CM/< ICD-10 Diagnosis Description E11.621 Type 2 diabetes mellitus with foot ulcer L97.512 Non-pressure chronic ulcer of other part of right foot with fat layer exposed Modifier: Quantity: 1 Physician Procedures : CPT4 Code Description Modifier [id number] - WC PHYS SUBQ TISS 20 SQ CM ICD-10 Diagnosis Description E11.621 Type 2 diabetes mellitus with foot ulcer L97.512 Non-pressure chronic ulcer of other part of right foot with fat layer exposed Quantity: 1 Electronic Signature(s) Signed: 10/18/2020 5:55:48 PM By: Linton Ham MD Entered By: Linton Ham on 10/18/2020 15:35:58

## 2020-10-19 NOTE — Progress Notes (Signed)
BRESHAY, ILG ([id number]) Visit Report for 10/18/2020 Arrival Information Details Patient Name: Date of Service: Nicole Golden 10/18/2020 1:30 PM Medical Record Number: [id number] Patient Account Number: [id number] Date of Birth/Sex: Treating RN: 1961-05-24 (60 y.o. [name] Primary Care Gentle Hoge: PA TEL, Darel Hong Other Clinician: Referring Moani Weipert: Treating Jacky Hartung/Extender: Linton Ham PA TEL, Ena Dawley in Treatment: 3 Visit Information History Since Last Visit Added or deleted any medications: No Patient Arrived: Gilford Rile Any new allergies or adverse reactions: No Arrival Time: 13:43 Had a fall or experienced change in No Accompanied By: self activities of daily living that may affect Transfer Assistance: None risk of falls: Patient Identification Verified: Yes Signs or symptoms of abuse/neglect since last visito No Secondary Verification Process Completed: Yes Hospitalized since last visit: No Patient Requires Transmission-Based Precautions: No Implantable device outside of the clinic excluding No Patient Has Alerts: No cellular tissue based products placed in the center since last visit: Has Dressing in Place as Prescribed: Yes Pain Present Now: No Electronic Signature(s) Signed: 10/19/2020 9:47:25 AM By: Sandre Kitty Entered By: Sandre Kitty on 10/18/2020 13:43:28 -------------------------------------------------------------------------------- Encounter Discharge Information Details Patient Name: Date of Service: Nicole Golden, Nicole Burns J. 10/18/2020 1:30 PM Medical Record Number: [id number] Patient Account Number: [id number] Date of Birth/Sex: Treating RN: 1960-12-05 (60 y.o. [name] Primary Care Daman Steffenhagen: PA TEL, Darel Hong Other Clinician: Referring Gretta Samons: Treating Wasyl Dornfeld/Extender: Linton Ham PA TEL, Ena Dawley in Treatment: 3 Encounter Discharge Information Items Post Procedure Vitals Discharge Condition:  Stable Temperature (F): 98.4 Ambulatory Status: Ambulatory Pulse (bpm): 89 Discharge Destination: Home Respiratory Rate (breaths/min): 17 Transportation: Private Auto Blood Pressure (mmHg): 154/75 Accompanied By: self Schedule Follow-up Appointment: Yes Clinical Summary of Care: Electronic Signature(s) Signed: 10/18/2020 6:14:41 PM By: Deon Pilling Entered By: Deon Pilling on 10/18/2020 17:47:07 -------------------------------------------------------------------------------- Lower Extremity Assessment Details Patient Name: Date of Service: Nicole Jester J. 10/18/2020 1:30 PM Medical Record Number: [id number] Patient Account Number: [id number] Date of Birth/Sex: Treating RN: 03/06/1961 (60 y.o. [name] Primary Care Troi Bechtold: PA TEL, Darel Hong Other Clinician: Referring Amori Colomb: Treating Raschelle Wisenbaker/Extender: Linton Ham PA TEL, Ena Dawley in Treatment: 3 Edema Assessment Assessed: [Left: No] [Right: Yes] Edema: [Left: N] [Right: o] Calf Left: Right: Point of Measurement: 34 cm From Medial Instep 33 cm Ankle Left: Right: Point of Measurement: 11 cm From Medial Instep 23 cm Vascular Assessment Pulses: Dorsalis Pedis Palpable: [Right:Yes] Electronic Signature(s) Signed: 10/18/2020 6:14:41 PM By: Deon Pilling Entered By: Deon Pilling on 10/18/2020 14:13:21 -------------------------------------------------------------------------------- Multi Wound Chart Details Patient Name: Date of Service: Nicole Golden, Nicole Burns J. 10/18/2020 1:30 PM Medical Record Number: [id number] Patient Account Number: [id number] Date of Birth/Sex: Treating RN: 23-Apr-1961 (60 y.o. [name] Primary Care Shellia Hartl: PA TEL, Darel Hong Other Clinician: Referring Brayen Bunn: Treating Tyria Springer/Extender: Linton Ham PA TEL, Ena Dawley in Treatment: 3 Vital Signs Height(in): 68 Pulse(bpm): 57 Weight(lbs): 200 Blood Pressure(mmHg): 154/75 Body Mass Index(BMI): 30 Temperature(F):  98.4 Respiratory Rate(breaths/min): 17 Photos: [1:No Photos Right Metatarsal head first] [N/A:N/A N/A] Wound Location: [1:Gradually Appeared] [N/A:N/A] Wounding Event: [1:Diabetic Wound/Ulcer of the Lower] [N/A:N/A] Primary Etiology: [1:Extremity Hypertension, Type II Diabetes,] [N/A:N/A] Comorbid History: [1:Neuropathy 06/28/2020] [N/A:N/A] Date Acquired: [1:3] [N/A:N/A] Weeks of Treatment: [1:Open] [N/A:N/A] Wound Status: [1:0.6x0.3x0.3] [N/A:N/A] Measurements L x W x D (cm) [1:0.141] [N/A:N/A] A (cm) : rea [1:0.042] [N/A:N/A] Volume (cm) : [1:55.10%] [N/A:N/A] % Reduction in A rea: [1:86.60%] [N/A:N/A] % Reduction in Volume: [1:Grade 2] [N/A:N/A] Classification: [1:Medium] [  N/A:N/A] Exudate A mount: [1:Serosanguineous] [N/A:N/A] Exudate Type: [1:red, brown] [N/A:N/A] Exudate Color: [1:Thickened] [N/A:N/A] Wound Margin: [1:Large (67-100%)] [N/A:N/A] Granulation A mount: [1:Pink] [N/A:N/A] Granulation Quality: [1:None Present (0%)] [N/A:N/A] Necrotic A mount: [1:Fat Layer (Subcutaneous Tissue): Yes N/A] Exposed Structures: [1:Fascia: No Tendon: No Muscle: No Joint: No Bone: No Large (67-100%)] [N/A:N/A] Epithelialization: [1:Debridement - Excisional] [N/A:N/A] Debridement: Pre-procedure Verification/Time Out 14:34 [N/A:N/A] Taken: [1:Callus, Subcutaneous] [N/A:N/A] Tissue Debrided: [1:Skin/Subcutaneous Tissue] [N/A:N/A] Level: [1:0.18] [N/A:N/A] Debridement A (sq cm): [1:rea Curette] [N/A:N/A] Instrument: [1:Moderate] [N/A:N/A] Bleeding: [1:Pressure] [N/A:N/A] Hemostasis A chieved: [1:0] [N/A:N/A] Procedural Pain: [1:0] [N/A:N/A] Post Procedural Pain: [1:Procedure was tolerated well] [N/A:N/A] Debridement Treatment Response: [1:0.6x0.3x0.3] [N/A:N/A] Post Debridement Measurements L x W x D (cm) [1:0.042] [N/A:N/A] Post Debridement Volume: (cm) [1:callous to periwound.] [N/A:N/A] Assessment Notes: [1:Debridement] [N/A:N/A] Procedures Performed: [1:T Contact Cast  otal] Treatment Notes Electronic Signature(s) Signed: 10/18/2020 5:41:15 PM By: Levan Hurst RN, BSN Signed: 10/18/2020 5:55:48 PM By: Linton Ham MD Entered By: Linton Ham on 10/18/2020 15:33:40 -------------------------------------------------------------------------------- Multi-Disciplinary Care Plan Details Patient Name: Date of Service: Nicole Golden, Nicole Burns J. 10/18/2020 1:30 PM Medical Record Number: [id number] Patient Account Number: [id number] Date of Birth/Sex: Treating RN: [birth year] (60 y.o. [name] Primary Care Daison Braxton: PA TEL, Darel Hong Other Clinician: Referring Mallerie Blok: Treating Davin Archuletta/Extender: Linton Ham PA TEL, Ena Dawley in Treatment: 3 Multidisciplinary Care Plan reviewed with physician Active Inactive Nutrition Nursing Diagnoses: Impaired glucose control: actual or potential Potential for alteratiion in Nutrition/Potential for imbalanced nutrition Goals: Patient/caregiver will maintain therapeutic glucose control Date Initiated: 09/24/2020 Target Resolution Date: 10/29/2020 Goal Status: Active Interventions: Assess HgA1c results as ordered upon admission and as needed Assess patient nutrition upon admission and as needed per policy Provide education on elevated blood sugars and impact on wound healing Treatment Activities: Patient referred to Primary Care Physician for further nutritional evaluation : 09/24/2020 Notes: Wound/Skin Impairment Nursing Diagnoses: Impaired tissue integrity Knowledge deficit related to ulceration/compromised skin integrity Goals: Patient/caregiver will verbalize understanding of skin care regimen Date Initiated: 09/24/2020 Target Resolution Date: 10/29/2020 Goal Status: Active Ulcer/skin breakdown will have a volume reduction of 30% by week 4 Date Initiated: 09/24/2020 Target Resolution Date: 10/29/2020 Goal Status: Active Interventions: Assess patient/caregiver ability to obtain necessary  supplies Assess patient/caregiver ability to perform ulcer/skin care regimen upon admission and as needed Assess ulceration(s) every visit Provide education on ulcer and skin care Treatment Activities: Skin care regimen initiated : 09/24/2020 Topical wound management initiated : 09/24/2020 Notes: Electronic Signature(s) Signed: 10/18/2020 5:41:15 PM By: Levan Hurst RN, BSN Entered By: Levan Hurst on 10/18/2020 14:27:17 -------------------------------------------------------------------------------- Pain Assessment Details Patient Name: Date of Service: Nicole Jester J. 10/18/2020 1:30 PM Medical Record Number: [id number] Patient Account Number: [id number] Date of Birth/Sex: Treating RN: [birth year] (60 y.o. [name] Primary Care Gerald Kuehl: PA TEL, Darel Hong Other Clinician: Referring Britten Seyfried: Treating Toyia Jelinek/Extender: Linton Ham PA TEL, Ena Dawley in Treatment: 3 Active Problems Location of Pain Severity and Description of Pain Patient Has Paino No Site Locations Pain Management and Medication Current Pain Management: Electronic Signature(s) Signed: 10/18/2020 5:41:15 PM By: Levan Hurst RN, BSN Signed: 10/19/2020 9:47:25 AM By: Sandre Kitty Entered By: Sandre Kitty on 10/18/2020 13:43:50 -------------------------------------------------------------------------------- Patient/Caregiver Education Details Patient Name: Date of Service: Nicole Golden 2/21/2022andnbsp1:30 PM Medical Record Number: [id number] Patient Account Number: [id number] Date of Birth/Gender: Treating RN: 1961/01/05 (60 y.o. [name] Primary Care Physician: PA Kristen Loader Other Clinician: Referring Physician: Treating Physician/Extender: Linton Ham PA TEL,  NILA Y Weeks in Treatment: 3 Education Assessment Education Provided To: Patient Education Topics Provided Elevated Blood Sugar/ Impact on Healing: Methods: Explain/Verbal Responses: State content  correctly Wound/Skin Impairment: Methods: Explain/Verbal Responses: State content correctly Electronic Signature(s) Signed: 10/18/2020 5:41:15 PM By: Levan Hurst RN, BSN Entered By: Levan Hurst on 10/18/2020 14:27:41 -------------------------------------------------------------------------------- Wound Assessment Details Patient Name: Date of Service: Nicole Jester J. 10/18/2020 1:30 PM Medical Record Number: [id number] Patient Account Number: [id number] Date of Birth/Sex: Treating RN: 23-Jan-1961 (60 y.o. [name] Primary Care Wessley Emert: PA TEL, Darel Hong Other Clinician: Referring Javi Bollman: Treating Alder Murri/Extender: Linton Ham PA TEL, Ena Dawley in Treatment: 3 Wound Status Wound Number: 1 Primary Etiology: Diabetic Wound/Ulcer of the Lower Extremity Wound Location: Right Metatarsal head first Wound Status: Open Wounding Event: Gradually Appeared Comorbid History: Hypertension, Type II Diabetes, Neuropathy Date Acquired: 06/28/2020 Weeks Of Treatment: 3 Clustered Wound: No Wound Measurements Length: (cm) 0.6 Width: (cm) 0.3 Depth: (cm) 0.3 Area: (cm) 0.141 Volume: (cm) 0.042 % Reduction in Area: 55.1% % Reduction in Volume: 86.6% Epithelialization: Large (67-100%) Tunneling: No Undermining: No Wound Description Classification: Grade 2 Wound Margin: Thickened Exudate Amount: Medium Exudate Type: Serosanguineous Exudate Color: red, brown Foul Odor After Cleansing: No Slough/Fibrino No Wound Bed Granulation Amount: Large (67-100%) Exposed Structure Granulation Quality: Pink Fascia Exposed: No Necrotic Amount: None Present (0%) Fat Layer (Subcutaneous Tissue) Exposed: Yes Tendon Exposed: No Muscle Exposed: No Joint Exposed: No Bone Exposed: No Assessment Notes callous to periwound. Treatment Notes Wound #1 (Metatarsal head first) Wound Laterality: Right Cleanser Wound Cleanser Discharge Instruction: Cleanse the wound with wound cleanser  prior to applying a clean dressing using gauze sponges, not tissue or cotton balls. Peri-Wound Care Sween Lotion (Moisturizing lotion) Discharge Instruction: Apply moisturizing lotion as directed Topical Primary Dressing Promogran Prisma Matrix, 4.34 (sq in) (silver collagen) Discharge Instruction: Moisten collagen with saline or hydrogel Secondary Dressing Woven Gauze Sponges 2x2 in Discharge Instruction: Apply over primary dressing as directed. Optifoam Non-Adhesive Dressing, 4x4 in Discharge Instruction: Apply over primary dressing cut to make foam donut to help offload Secured With 58M Medipore H Soft Cloth Surgical T 4 x 2 (in/yd) ape Discharge Instruction: Secure dressing with tape as directed. Compression Wrap Compression Stockings Add-Ons Notes MD applied the TCC. Electronic Signature(s) Signed: 10/18/2020 5:41:15 PM By: Levan Hurst RN, BSN Signed: 10/18/2020 6:14:41 PM By: Deon Pilling Entered By: Deon Pilling on 10/18/2020 14:13:53 -------------------------------------------------------------------------------- Vitals Details Patient Name: Date of Service: Nicole Golden, Nicole Burns J. 10/18/2020 1:30 PM Medical Record Number: [id number] Patient Account Number: [id number] Date of Birth/Sex: Treating RN: 11/29/1960 (60 y.o. [name] Primary Care Emrey Thornley: PA TEL, Darel Hong Other Clinician: Referring Clemencia Helzer: Treating Tenesia Escudero/Extender: Linton Ham PA TEL, Ena Dawley in Treatment: 3 Vital Signs Time Taken: 13:43 Temperature (F): 98.4 Height (in): 68 Pulse (bpm): 89 Weight (lbs): 200 Respiratory Rate (breaths/min): 17 Body Mass Index (BMI): 30.4 Blood Pressure (mmHg): 154/75 Reference Range: 80 - 120 mg / dl Electronic Signature(s) Signed: 10/19/2020 9:47:25 AM By: Sandre Kitty Entered By: Sandre Kitty on 10/18/2020 13:43:44

## 2020-10-25 ENCOUNTER — Encounter (HOSPITAL_BASED_OUTPATIENT_CLINIC_OR_DEPARTMENT_OTHER): Payer: Medicare HMO | Admitting: Internal Medicine

## 2020-10-25 ENCOUNTER — Other Ambulatory Visit: Payer: Self-pay

## 2020-10-25 DIAGNOSIS — E11621 Type 2 diabetes mellitus with foot ulcer: Secondary | ICD-10-CM | POA: Diagnosis not present

## 2020-10-25 NOTE — Progress Notes (Signed)
Nicole Golden, Nicole Golden ([id number]) Visit Report for 10/25/2020 Debridement Details Patient Name: Date of Service: Nicole Golden 10/25/2020 1:45 PM Medical Record Number: [id number] Patient Account Number: [id number] Date of Birth/Sex: Treating RN: February 24, 1961 (60 y.o. [name] Primary Care Provider: PA TEL, Darel Hong Other Clinician: Referring Provider: Treating Provider/Extender: Linton Ham PA TEL, Ena Dawley in Treatment: 4 Debridement Performed for Assessment: Wound #1 Right Metatarsal head first Performed By: Physician Ricard Dillon., MD Debridement Type: Debridement Severity of Tissue Pre Debridement: Fat layer exposed Level of Consciousness (Pre-procedure): Awake and Alert Pre-procedure Verification/Time Out Yes - 14:45 Taken: Start Time: 14:45 T Area Debrided (L x W): otal 0.5 (cm) x 0.2 (cm) = 0.1 (cm) Tissue and other material debrided: Viable, Non-Viable, Callus, Subcutaneous Level: Skin/Subcutaneous Tissue Debridement Description: Excisional Instrument: Curette Bleeding: Moderate Hemostasis Achieved: Silver Nitrate End Time: 14:46 Procedural Pain: 0 Post Procedural Pain: 0 Response to Treatment: Procedure was tolerated well Level of Consciousness (Post- Awake and Alert procedure): Post Debridement Measurements of Total Wound Length: (cm) 0.5 Width: (cm) 0.2 Depth: (cm) 0.5 Volume: (cm) 0.039 Character of Wound/Ulcer Post Debridement: Improved Severity of Tissue Post Debridement: Fat layer exposed Post Procedure Diagnosis Same as Pre-procedure Electronic Signature(s) Signed: 10/25/2020 5:56:44 PM By: Levan Hurst RN, BSN Signed: 10/25/2020 6:21:54 PM By: Linton Ham MD Entered By: Linton Ham on 10/25/2020 14:54:20 -------------------------------------------------------------------------------- HPI Details Patient Name: Date of Service: Nicole Golden, Nicole Burns J. 10/25/2020 1:45 PM Medical Record Number: [id number] Patient Account Number:  [id number] Date of Birth/Sex: Treating RN: Jul 04, 1961 (60 y.o. [name] Primary Care Provider: PA TEL, Darel Hong Other Clinician: Referring Provider: Treating Provider/Extender: Linton Ham PA TEL, Ena Dawley in Treatment: 4 History of Present Illness HPI Description: Admission 09/24/2020 This is a 59 year old woman with type 2 diabetes poorly controlled and polyneuropathy. She has been been dealing with an area on her right first plantar metatarsal head for about 2 months. She has been following at [facility] using Santyl and callus debridements. 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 She has been using Santyl for 2 months she has had x-rays done in the podiatrist office there was apparently nothing noted although I am not able to review this. She has not been on antibiotics. Looking through [facility] health link she has had wounds on both feet in the past for which she is followed with Dr.Petery at [facility]. Past medical history includes type 2 diabetes with most recent hemoglobin A1c of 12.4, diabetic peripheral neuropathy, hypertension, hyperlipidemia, lichen simplex chronicus ABI in our clinic was 0.91 on the right. 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 She is not working currently normally works at [employer]. She said she can hold out to the end of the month. The wound measures slightly smaller today we use silver alginate. She did not tolerate a forefoot offloading boot we have her in a surgical shoe with felt offloading 2/11; right first metatarsal head the wound looks clean although there is some depth surrounding thick skin and callus. I did not debride this. 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  She did not tolerate a forefoot off loader because  of balance issues 2/14; follow-up total contact cast reapplied other than slippery on her hardwood floors she had no specific concerns. We did not look at the wound today 2/21; right first metatarsal head. She is tolerating the total contact cast and the wound is measuring slightly smaller. We have been using silver collagen 2/28 right first metatarsal head. Again thick buildup of callus. With careful probing the open wound extends under the callus. This required a debridement. We have been using silver collagen I am going to change to [employer]) Signed: 10/25/2020 6:21:54 PM By: Linton Ham MD Entered By: Linton Ham on 10/25/2020 14:55:09 -------------------------------------------------------------------------------- Physical Exam Details Patient Name: Date of Service: Nicole Golden 10/25/2020 1:45 PM Medical Record Number: [id number] Patient Account Number: [id number] Date of Birth/Sex: Treating RN: 1960-09-01 (60 y.o. [name] Primary Care Provider: PA TEL, Darel Hong Other Clinician: Referring Provider: Treating Provider/Extender: Linton Ham PA TEL, Ena Dawley in Treatment: 4 Constitutional Sitting or standing Blood Pressure is within target range for patient.. Pulse regular and within target range for patient.Marland Kitchen Respirations regular, non-labored and within target range.. Temperature is normal and within the target range for the patient.Marland Kitchen Appears in no distress. Notes Wound exam; the wound itself is small. Versus last week extensive undermining circumferentially around the open wound. I used pickups and a #15 scalpel to remove skin and subcutaneous tissue. Electronic Signature(s) Signed: 10/25/2020 6:21:54 PM By: Linton Ham MD Entered By: Linton Ham on 10/25/2020 14:56:48 -------------------------------------------------------------------------------- Physician Orders  Details Patient Name: Date of Service: Nicole Golden 10/25/2020 1:45 PM Medical Record Number: [id number] Patient Account Number: [id number] Date of Birth/Sex: Treating RN: 1961/05/31 (60 y.o. [name] Primary Care Provider: PA TEL, Darel Hong Other Clinician: Referring Provider: Treating Provider/Extender: Linton Ham PA TEL, Ena Dawley in Treatment: 4 Verbal / Phone Orders: No Diagnosis Coding ICD-10 Coding Code Description E11.621 Type 2 diabetes mellitus with foot ulcer L97.512 Non-pressure chronic ulcer of other part of right foot with fat layer exposed E11.42 Type 2 diabetes mellitus with diabetic polyneuropathy Follow-up Appointments Return Appointment in 1 week. Bathing/ Shower/ Hygiene May shower with protection but do not get wound dressing(s) wet. Off-Loading Total Contact Cast to Right Lower Extremity Wound Treatment Wound #1 - Metatarsal head first Wound Laterality: Right Cleanser: Wound Cleanser 1 x Per Week/7 Days Discharge Instructions: Cleanse the wound with wound cleanser prior to applying a clean dressing using gauze sponges, not tissue or cotton balls. Peri-Wound Care: Sween Lotion (Moisturizing lotion) 1 x Per Week/7 Days Discharge Instructions: Apply moisturizing lotion as directed Prim Dressing: Hydrofera Blue Classic Foam, 2x2 in 1 x Per Week/7 Days ary Discharge Instructions: Moisten with saline prior to applying to wound bed Secondary Dressing: Woven Gauze Sponges 2x2 in (Generic) 1 x Per Week/7 Days Discharge Instructions: Apply over primary dressing as directed. Secondary Dressing: Optifoam Non-Adhesive Dressing, 4x4 in (Generic) 1 x Per Week/7 Days Discharge Instructions: Apply over primary dressing cut to make foam donut to help offload Secured With: 34M Medipore H Soft Cloth Surgical T 4 x 2 (in/yd) 1 x Per Week/7 Days ape Discharge Instructions: Secure dressing with tape as directed. Electronic Signature(s) Signed: 10/25/2020  5:56:44 PM By: Levan Hurst RN, BSN Signed: 10/25/2020 6:21:54 PM By: Linton Ham MD Entered By: Levan Hurst on 10/25/2020 14:49:16 -------------------------------------------------------------------------------- Problem List Details Patient Name: Date of Service: Nicole Golden 10/25/2020 1:45 PM Medical Record Number: [id number] Patient Account Number: [id number] Date  of Birth/Sex: Treating RN: May 09, 1961 (60 y.o. [name] Primary Care Provider: PA TEL, Darel Hong Other Clinician: Referring Provider: Treating Provider/Extender: Linton Ham PA TEL, Ena Dawley in Treatment: 4 Active Problems ICD-10 Encounter Code Description Active Date MDM Diagnosis E11.621 Type 2 diabetes mellitus with foot ulcer 09/24/2020 No Yes L97.512 Non-pressure chronic ulcer of other part of right foot with fat layer exposed 09/24/2020 No Yes E11.42 Type 2 diabetes mellitus with diabetic polyneuropathy 09/24/2020 No Yes Inactive Problems Resolved Problems Electronic Signature(s) Signed: 10/25/2020 6:21:54 PM By: Linton Ham MD Entered By: Linton Ham on 10/25/2020 14:54:05 -------------------------------------------------------------------------------- Progress Note Details Patient Name: Date of Service: Nicole Jester J. 10/25/2020 1:45 PM Medical Record Number: [id number] Patient Account Number: [id number] Date of Birth/Sex: Treating RN: 24-Dec-1960 (60 y.o. [name] Primary Care Provider: PA TEL, Darel Hong Other Clinician: Referring Provider: Treating Provider/Extender: Linton Ham PA TEL, Ena Dawley in Treatment: 4 Subjective History of Present Illness (HPI) Admission 09/24/2020 This is a 60 year old woman with type 2 diabetes poorly controlled and polyneuropathy. She has been been dealing with an area on her right first plantar metatarsal head for about 2 months. She has been following at [facility] using Santyl and callus debridements. 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 She has been using Santyl for 2 months she has had x-rays done in the podiatrist office there was apparently nothing noted although I am not able to review this. She has not been on antibiotics. Looking through [facility] health link she has had wounds on both feet in the past for which she is followed with Dr.Petery at [facility]. Past medical history includes type 2 diabetes with most recent hemoglobin A1c of 12.4, diabetic peripheral neuropathy, hypertension, hyperlipidemia, lichen simplex chronicus ABI in our clinic was 0.91 on the right. 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 She is not working currently normally works at [employer]. She said she can hold out to the end of the month. The wound measures slightly smaller today we use silver alginate. She did not tolerate a forefoot offloading boot we have her in a surgical shoe with felt offloading 2/11; right first metatarsal head the wound looks clean although there is some depth surrounding thick skin and callus. I did not debride this. 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 She did not tolerate a forefoot off loader because of balance issues 2/14; follow-up total contact cast reapplied other than slippery on her hardwood floors she had no specific concerns. We did not look at the wound today 2/21; right first metatarsal head. She is tolerating the total contact cast and the wound is measuring slightly smaller. We have been using silver collagen 2/28 right first metatarsal head. Again thick buildup of callus. With careful probing the open wound extends under the callus. This required a debridement. We have been using silver collagen I am going to  change to Hydrofera Blue Objective Constitutional Sitting or standing Blood Pressure is within target range for patient.. Pulse regular and within target range for patient.Marland Kitchen Respirations regular, non-labored and within target range.. Temperature is normal and within the target range for the patient.Marland Kitchen Appears in  no distress. Vitals Time Taken: 2:19 PM, Height: 68 in, Weight: 200 lbs, BMI: 30.4, Temperature: 98.5 F, Pulse: 84 bpm, Respiratory Rate: 17 breaths/min, Blood Pressure: 138/70 mmHg. General Notes: Wound exam; the wound itself is small. Versus last week extensive undermining circumferentially around the open wound. I used pickups and a #15 scalpel to remove skin and subcutaneous tissue. Integumentary (Hair, Skin) Wound #1 status is Open. Original cause of wound was Gradually Appeared. The date acquired was: 06/28/2020. The wound has been in treatment 4 weeks. The wound is located on the Right Metatarsal head first. The wound measures 0.5cm length x 0.2cm width x 0.5cm depth; 0.079cm^2 area and 0.039cm^3 volume. There is Fat Layer (Subcutaneous Tissue) exposed. There is no tunneling noted, however, there is undermining starting at 12:00 and ending at 12:00 with a maximum distance of 1cm. There is a medium amount of serosanguineous drainage noted. The wound margin is thickened. There is large (67-100%) pink, pale granulation within the wound bed. There is no necrotic tissue within the wound bed. Assessment Active Problems ICD-10 Type 2 diabetes mellitus with foot ulcer Non-pressure chronic ulcer of other part of right foot with fat layer exposed Type 2 diabetes mellitus with diabetic polyneuropathy Procedures Wound #1 Pre-procedure diagnosis of Wound #1 is a Diabetic Wound/Ulcer of the Lower Extremity located on the Right Metatarsal head first .Severity of Tissue Pre Debridement is: Fat layer exposed. There was a Excisional Skin/Subcutaneous Tissue Debridement with a total area of 0.1  sq cm performed by Ricard Dillon., MD. With the following instrument(s): Curette to remove Viable and Non-Viable tissue/material. Material removed includes Callus and Subcutaneous Tissue and. No specimens were taken. A time out was conducted at 14:45, prior to the start of the procedure. A Moderate amount of bleeding was controlled with Silver Nitrate. The procedure was tolerated well with a pain level of 0 throughout and a pain level of 0 following the procedure. Post Debridement Measurements: 0.5cm length x 0.2cm width x 0.5cm depth; 0.039cm^3 volume. Character of Wound/Ulcer Post Debridement is improved. Severity of Tissue Post Debridement is: Fat layer exposed. Post procedure Diagnosis Wound #1: Same as Pre-Procedure Pre-procedure diagnosis of Wound #1 is a Diabetic Wound/Ulcer of the Lower Extremity located on the Right Metatarsal head first . There was a T Contact otal Cast Procedure by Ricard Dillon., MD. Post procedure Diagnosis Wound #1: Same as Pre-Procedure Plan Follow-up Appointments: Return Appointment in 1 week. Bathing/ Shower/ Hygiene: May shower with protection but do not get wound dressing(s) wet. Off-Loading: T Contact Cast to Right Lower Extremity otal WOUND #1: - Metatarsal head first Wound Laterality: Right Cleanser: Wound Cleanser 1 x Per Week/7 Days Discharge Instructions: Cleanse the wound with wound cleanser prior to applying a clean dressing using gauze sponges, not tissue or cotton balls. Peri-Wound Care: Sween Lotion (Moisturizing lotion) 1 x Per Week/7 Days Discharge Instructions: Apply moisturizing lotion as directed Prim Dressing: Hydrofera Blue Classic Foam, 2x2 in 1 x Per Week/7 Days ary Discharge Instructions: Moisten with saline prior to applying to wound bed Secondary Dressing: Woven Gauze Sponges 2x2 in (Generic) 1 x Per Week/7 Days Discharge Instructions: Apply over primary dressing as directed. Secondary Dressing: Optifoam Non-Adhesive  Dressing, 4x4 in (Generic) 1 x Per Week/7 Days Discharge Instructions: Apply over primary dressing cut to make foam donut to help offload Secured With: 57M Medipore H Soft Cloth Surgical T 4 x 2 (in/yd) 1 x Per Week/7 Days ape Discharge Instructions: Secure dressing with tape as directed. 1.  After an extensive debridement I change the dressing to [facility] still under a total contact cast Electronic Signature(s) Signed: 10/25/2020 6:21:54 PM By: Linton Ham MD Entered By: Linton Ham on 10/25/2020 14:57:31 -------------------------------------------------------------------------------- Total Contact Cast Details Patient Name: Date of Service: Nicole Jester J. 10/25/2020 1:45 PM Medical Record Number: [id number] Patient Account Number: [id number] Date of Birth/Sex: Treating RN: 06/23/1961 (60 y.o. [name] Primary Care Provider: PA TEL, Darel Hong Other Clinician: Referring Provider: Treating Provider/Extender: Linton Ham PA TEL, Ena Dawley in Treatment: 4 T Contact Cast Applied for Wound Assessment: otal Wound #1 Right Metatarsal head first Performed By: Physician Ricard Dillon., MD Post Procedure Diagnosis Same as Pre-procedure Electronic Signature(s) Signed: 10/25/2020 6:21:54 PM By: Linton Ham MD Entered By: Linton Ham on 10/25/2020 14:54:29 -------------------------------------------------------------------------------- SuperBill Details Patient Name: Date of Service: Nicole Golden, Morton Amy 10/25/2020 Medical Record Number: [id number] Patient Account Number: [id number] Date of Birth/Sex: Treating RN: 1960/09/11 (60 y.o. [name] Primary Care Provider: PA TEL, Darel Hong Other Clinician: Referring Provider: Treating Provider/Extender: Linton Ham PA TEL, Ena Dawley in Treatment: 4 Diagnosis Coding ICD-10 Codes Code Description E11.621 Type 2 diabetes mellitus with foot ulcer L97.512 Non-pressure chronic ulcer of other part  of right foot with fat layer exposed E11.42 Type 2 diabetes mellitus with diabetic polyneuropathy Facility Procedures CPT4 Code: [id number] Description: 69485 - DEB SUBQ TISSUE 20 SQ CM/< ICD-10 Diagnosis Description L97.512 Non-pressure chronic ulcer of other part of right foot with fat layer exposed Modifier: Quantity: 1 Physician Procedures : CPT4 Code Description Modifier [id number] - WC PHYS SUBQ TISS 20 SQ CM ICD-10 Diagnosis Description L97.512 Non-pressure chronic ulcer of other part of right foot with fat layer exposed Quantity: 1 Electronic Signature(s) Signed: 10/25/2020 6:21:54 PM By: Linton Ham MD Entered By: Linton Ham on 10/25/2020 14:57:48

## 2020-10-26 NOTE — Progress Notes (Signed)
Nicole Golden, Nicole Golden ([id number]) Visit Report for 10/25/2020 Arrival Information Details Patient Name: Date of Service: Nicole Golden 10/25/2020 1:45 PM Medical Record Number: [id number] Patient Account Number: [id number] Date of Birth/Sex: Treating RN: 1961-06-03 (60 y.o. [name] Primary Care Provider: PA TEL, Darel Hong Other Clinician: Referring Provider: Treating Provider/Extender: Linton Ham PA TEL, Ena Dawley in Treatment: 4 Visit Information History Since Last Visit Added or deleted any medications: No Patient Arrived: Nicole Golden Any new allergies or adverse reactions: No Arrival Time: 14:18 Had a fall or experienced change in No Accompanied By: self activities of daily living that may affect Transfer Assistance: None risk of falls: Patient Identification Verified: Yes Signs or symptoms of abuse/neglect since last visito No Secondary Verification Process Completed: Yes Hospitalized since last visit: No Patient Requires Transmission-Based Precautions: No Implantable device outside of the clinic excluding No Patient Has Alerts: No cellular tissue based products placed in the center since last visit: Has Dressing in Place as Prescribed: Yes Pain Present Now: No Electronic Signature(s) Signed: 10/26/2020 8:29:17 AM By: Sandre Kitty Entered By: Sandre Kitty on 10/25/2020 14:19:06 -------------------------------------------------------------------------------- Encounter Discharge Information Details Patient Name: Date of Service: Nicole Golden, Nicole Burns J. 10/25/2020 1:45 PM Medical Record Number: [id number] Patient Account Number: [id number] Date of Birth/Sex: Treating RN: 03/13/1961 (60 y.o. [name] Primary Care Provider: PA TEL, Darel Hong Other Clinician: Referring Provider: Treating Provider/Extender: Linton Ham PA TEL, Ena Dawley in Treatment: 4 Encounter Discharge Information Items Post Procedure Vitals Discharge Condition: Stable Temperature  (F): 98.5 Ambulatory Status: Walker Pulse (bpm): 84 Discharge Destination: Home Respiratory Rate (breaths/min): 17 Transportation: Private Auto Blood Pressure (mmHg): 138/70 Accompanied By: self Schedule Follow-up Appointment: Yes Clinical Summary of Care: Electronic Signature(s) Signed: 10/25/2020 5:39:38 PM By: Deon Pilling Entered By: Deon Pilling on 10/25/2020 17:39:17 -------------------------------------------------------------------------------- Lower Extremity Assessment Details Patient Name: Date of Service: Nicole Golden 10/25/2020 1:45 PM Medical Record Number: [id number] Patient Account Number: [id number] Date of Birth/Sex: Treating RN: [birth year] (60 y.o. [name] Primary Care Provider: PA TEL, Darel Hong Other Clinician: Referring Provider: Treating Provider/Extender: Linton Ham PA TEL, Ena Dawley in Treatment: 4 Edema Assessment Assessed: [Left: No] [Right: No] Edema: [Left: N] [Right: o] Calf Left: Right: Point of Measurement: 34 cm From Medial Instep 33 cm Ankle Left: Right: Point of Measurement: 11 cm From Medial Instep 23 cm Vascular Assessment Pulses: Dorsalis Pedis Palpable: [Right:Yes] Electronic Signature(s) Signed: 10/25/2020 5:56:44 PM By: Levan Hurst RN, BSN Entered By: Levan Hurst on 10/25/2020 14:47:00 -------------------------------------------------------------------------------- Multi Wound Chart Details Patient Name: Date of Service: Nicole Golden, Nicole Burns J. 10/25/2020 1:45 PM Medical Record Number: [id number] Patient Account Number: [id number] Date of Birth/Sex: Treating RN: 10-19-1960 (60 y.o. [name] Primary Care Provider: PA TEL, Darel Hong Other Clinician: Referring Provider: Treating Provider/Extender: Linton Ham PA TEL, Ena Dawley in Treatment: 4 Vital Signs Height(in): 68 Pulse(bpm): 64 Weight(lbs): 200 Blood Pressure(mmHg): 138/70 Body Mass Index(BMI): 30 Temperature(F):  98.5 Respiratory Rate(breaths/min): 17 Photos: [1:No Photos Right Metatarsal head first] [N/A:N/A N/A] Wound Location: [1:Gradually Appeared] [N/A:N/A] Wounding Event: [1:Diabetic Wound/Ulcer of the Lower] [N/A:N/A] Primary Etiology: [1:Extremity Hypertension, Type II Diabetes,] [N/A:N/A] Comorbid History: [1:Neuropathy 06/28/2020] [N/A:N/A] Date Acquired: [1:4] [N/A:N/A] Weeks of Treatment: [1:Open] [N/A:N/A] Wound Status: [1:0.5x0.2x0.5] [N/A:N/A] Measurements L x W x D (cm) [1:0.079] [N/A:N/A] A (cm) : rea [1:0.039] [N/A:N/A] Volume (cm) : [1:74.80%] [N/A:N/A] % Reduction in A rea: [1:87.60%] [N/A:N/A] % Reduction in Volume: [1:12] Starting Position  1 (o'clock): [1:12] Ending Position 1 (o'clock): [1:1] Maximum Distance 1 (cm): [1:Yes] [N/A:N/A] Undermining: [1:Grade 2] [N/A:N/A] Classification: [1:Medium] [N/A:N/A] Exudate A mount: [1:Serosanguineous] [N/A:N/A] Exudate Type: [1:red, brown] [N/A:N/A] Exudate Color: [1:Thickened] [N/A:N/A] Wound Margin: [1:Large (67-100%)] [N/A:N/A] Granulation A mount: [1:Pink, Pale] [N/A:N/A] Granulation Quality: [1:None Present (0%)] [N/A:N/A] Necrotic A mount: [1:Fat Layer (Subcutaneous Tissue): Yes N/A] Exposed Structures: [1:Fascia: No Tendon: No Muscle: No Joint: No Bone: No Large (67-100%)] [N/A:N/A] Epithelialization: [1:Debridement - Excisional] [N/A:N/A] Debridement: Pre-procedure Verification/Time Out 14:45 [N/A:N/A] Taken: [1:Callus, Subcutaneous] [N/A:N/A] Tissue Debrided: [1:Skin/Subcutaneous Tissue] [N/A:N/A] Level: [1:0.1] [N/A:N/A] Debridement A (sq cm): [1:rea Curette] [N/A:N/A] Instrument: [1:Moderate] [N/A:N/A] Bleeding: [1:Silver Nitrate] [N/A:N/A] Hemostasis A chieved: [1:0] [N/A:N/A] Procedural Pain: [1:0] [N/A:N/A] Post Procedural Pain: [1:Procedure was tolerated well] [N/A:N/A] Debridement Treatment Response: [1:0.5x0.2x0.5] [N/A:N/A] Post Debridement Measurements L x W x D (cm) [1:0.039] [N/A:N/A] Post  Debridement Volume: (cm) [1:Debridement] [N/A:N/A] Procedures Performed: [1:T Contact Cast otal] Treatment Notes Electronic Signature(s) Signed: 10/25/2020 5:56:44 PM By: Levan Hurst RN, BSN Signed: 10/25/2020 6:21:54 PM By: Linton Ham MD Entered By: Linton Ham on 10/25/2020 14:54:11 -------------------------------------------------------------------------------- Multi-Disciplinary Care Plan Details Patient Name: Date of Service: Nicole Golden, Nicole Burns J. 10/25/2020 1:45 PM Medical Record Number: [id number] Patient Account Number: [id number] Date of Birth/Sex: Treating RN: 06/04/1961 (60 y.o. [name] Primary Care Provider: PA TEL, Darel Hong Other Clinician: Referring Provider: Treating Provider/Extender: Linton Ham PA TEL, Ena Dawley in Treatment: 4 Multidisciplinary Care Plan reviewed with physician Active Inactive Nutrition Nursing Diagnoses: Impaired glucose control: actual or potential Potential for alteratiion in Nutrition/Potential for imbalanced nutrition Goals: Patient/caregiver will maintain therapeutic glucose control Date Initiated: 09/24/2020 Target Resolution Date: 11/26/2020 Goal Status: Active Interventions: Assess HgA1c results as ordered upon admission and as needed Assess patient nutrition upon admission and as needed per policy Provide education on elevated blood sugars and impact on wound healing Treatment Activities: Patient referred to Primary Care Physician for further nutritional evaluation : 09/24/2020 Notes: Wound/Skin Impairment Nursing Diagnoses: Impaired tissue integrity Knowledge deficit related to ulceration/compromised skin integrity Goals: Patient/caregiver will verbalize understanding of skin care regimen Date Initiated: 09/24/2020 Target Resolution Date: 11/26/2020 Goal Status: Active Ulcer/skin breakdown will have a volume reduction of 30% by week 4 Date Initiated: 09/24/2020 Date Inactivated: 10/25/2020 Target Resolution  Date: 10/29/2020 Goal Status: Met Ulcer/skin breakdown will have a volume reduction of 50% by week 8 Date Initiated: 10/25/2020 Target Resolution Date: 11/26/2020 Goal Status: Active Interventions: Assess patient/caregiver ability to obtain necessary supplies Assess patient/caregiver ability to perform ulcer/skin care regimen upon admission and as needed Assess ulceration(s) every visit Provide education on ulcer and skin care Treatment Activities: Skin care regimen initiated : 09/24/2020 Topical wound management initiated : 09/24/2020 Notes: Electronic Signature(s) Signed: 10/25/2020 5:56:44 PM By: Levan Hurst RN, BSN Entered By: Levan Hurst on 10/25/2020 17:24:18 -------------------------------------------------------------------------------- Pain Assessment Details Patient Name: Date of Service: Nicole Golden 10/25/2020 1:45 PM Medical Record Number: [id number] Patient Account Number: [id number] Date of Birth/Sex: Treating RN: 08-05-1961 (60 y.o. [name] Primary Care Provider: PA TEL, Darel Hong Other Clinician: Referring Provider: Treating Provider/Extender: Linton Ham PA TEL, Ena Dawley in Treatment: 4 Active Problems Location of Pain Severity and Description of Pain Patient Has Paino No Site Locations Pain Management and Medication Current Pain Management: Electronic Signature(s) Signed: 10/25/2020 5:56:44 PM By: Levan Hurst RN, BSN Signed: 10/26/2020 8:29:17 AM By: Sandre Kitty Entered By: Sandre Kitty on 10/25/2020 14:19:31 -------------------------------------------------------------------------------- Patient/Caregiver Education Details Patient Name: Date of Service: Nicole Golden,  RO Candie Chroman 2/28/2022andnbsp1:45 PM Medical Record Number: [id number] Patient Account Number: [id number] Date of Birth/Gender: Treating RN: 08-19-1961 (60 y.o. [name] Primary Care Physician: PA TEL, Darel Hong Other Clinician: Referring  Physician: Treating Physician/Extender: Linton Ham PA TEL, Ena Dawley in Treatment: 4 Education Assessment Education Provided To: Patient Education Topics Provided Wound/Skin Impairment: Methods: Explain/Verbal Responses: State content correctly Electronic Signature(s) Signed: 10/25/2020 5:56:44 PM By: Levan Hurst RN, BSN Entered By: Levan Hurst on 10/25/2020 17:24:27 -------------------------------------------------------------------------------- Wound Assessment Details Patient Name: Date of Service: Nicole Golden 10/25/2020 1:45 PM Medical Record Number: [id number] Patient Account Number: [id number] Date of Birth/Sex: Treating RN: Jun 23, 1961 (60 y.o. [name] Primary Care Provider: PA TEL, Darel Hong Other Clinician: Referring Provider: Treating Provider/Extender: Linton Ham PA TEL, Ena Dawley in Treatment: 4 Wound Status Wound Number: 1 Primary Etiology: Diabetic Wound/Ulcer of the Lower Extremity Wound Location: Right Metatarsal head first Wound Status: Open Wounding Event: Gradually Appeared Comorbid History: Hypertension, Type II Diabetes, Neuropathy Date Acquired: 06/28/2020 Weeks Of Treatment: 4 Clustered Wound: No Photos Wound Measurements Length: (cm) 0.5 Width: (cm) 0.2 Depth: (cm) 0.5 Area: (cm) 0.079 Volume: (cm) 0.039 % Reduction in Area: 74.8% % Reduction in Volume: 87.6% Epithelialization: Large (67-100%) Tunneling: No Undermining: Yes Starting Position (o'clock): 12 Ending Position (o'clock): 12 Maximum Distance: (cm) 1 Wound Description Classification: Grade 2 Wound Margin: Thickened Exudate Amount: Medium Exudate Type: Serosanguineous Exudate Color: red, brown Foul Odor After Cleansing: No Slough/Fibrino No Wound Bed Granulation Amount: Large (67-100%) Exposed Structure Granulation Quality: Pink, Pale Fascia Exposed: No Necrotic Amount: None Present (0%) Fat Layer (Subcutaneous Tissue) Exposed:  Yes Tendon Exposed: No Muscle Exposed: No Joint Exposed: No Bone Exposed: No Treatment Notes Wound #1 (Metatarsal head first) Wound Laterality: Right Cleanser Wound Cleanser Discharge Instruction: Cleanse the wound with wound cleanser prior to applying a clean dressing using gauze sponges, not tissue or cotton balls. Peri-Wound Care Sween Lotion (Moisturizing lotion) Discharge Instruction: Apply moisturizing lotion as directed Topical Primary Dressing Hydrofera Blue Classic Foam, 2x2 in Discharge Instruction: Moisten with saline prior to applying to wound bed Secondary Dressing Woven Gauze Sponges 2x2 in Discharge Instruction: Apply over primary dressing as directed. Optifoam Non-Adhesive Dressing, 4x4 in Discharge Instruction: Apply over primary dressing cut to make foam donut to help offload Secured With 86M Medipore H Soft Cloth Surgical T 4 x 2 (in/yd) ape Discharge Instruction: Secure dressing with tape as directed. Compression Wrap Compression Stockings Add-Ons Electronic Signature(s) Signed: 10/25/2020 5:56:44 PM By: Levan Hurst RN, BSN Signed: 10/26/2020 8:29:17 AM By: Sandre Kitty Entered By: Sandre Kitty on 10/25/2020 16:59:55 -------------------------------------------------------------------------------- [place] Details Patient Name: Date of Service: Nicole Golden, Nicole Burns J. 10/25/2020 1:45 PM Medical Record Number: [id number] Patient Account Number: [id number] Date of Birth/Sex: Treating RN: 1961-05-23 (60 y.o. [name] Primary Care Provider: PA TEL, Darel Hong Other Clinician: Referring Provider: Treating Provider/Extender: Linton Ham PA TEL, Ena Dawley in Treatment: 4 Vital Signs Time Taken: 14:19 Temperature (F): 98.5 Height (in): 68 Pulse (bpm): 84 Weight (lbs): 200 Respiratory Rate (breaths/min): 17 Body Mass Index (BMI): 30.4 Blood Pressure (mmHg): 138/70 Reference Range: 80 - 120 mg / dl Electronic Signature(s) Signed:  10/26/2020 8:29:17 AM By: Sandre Kitty Entered By: Sandre Kitty on 10/25/2020 14:19:23

## 2020-10-27 ENCOUNTER — Encounter: Payer: Self-pay | Admitting: Nutrition

## 2020-10-27 ENCOUNTER — Encounter: Payer: Medicare HMO | Attending: General Practice | Admitting: Nutrition

## 2020-10-27 ENCOUNTER — Other Ambulatory Visit: Payer: Self-pay

## 2020-10-27 DIAGNOSIS — E1165 Type 2 diabetes mellitus with hyperglycemia: Secondary | ICD-10-CM | POA: Diagnosis present

## 2020-10-27 DIAGNOSIS — E118 Type 2 diabetes mellitus with unspecified complications: Secondary | ICD-10-CM | POA: Insufficient documentation

## 2020-10-27 DIAGNOSIS — IMO0002 Reserved for concepts with insufficient information to code with codable children: Secondary | ICD-10-CM

## 2020-10-27 NOTE — Patient Instructions (Signed)
  Goals Established by Pt .   Keep up the good job. Do not skip meals. Follow My Plate Eat meals on time Eat 2-3 carb choices per meal Drink only water and work on cutting out diet ginger ale. Increase fresh frutis and vegetables Get A1X down to 7%

## 2020-10-27 NOTE — Progress Notes (Signed)
Medical Nutrition Therapy  This visit was completed via telephone due to the COVID-19 pandemic.   I spoke with Nicole Golden and verified that I was speaking with the correct person with two patient identifiers (full name and date of birth).   I discussed the limitations related to this kind of visit and the patient is willing to proceed   . Appointment Start time:  [phone] Appointment End time: 1015 Primary concerns today: Diabetes Type 2 , Referral diagnosis: E11.8 Preferred learning style: no preference indicated Learning readiness:  ready   NUTRITION ASSESSMENT  FBS: 112 /mg/dl.-150's before breakfast. Before dinner  250s. Sometimes skips lunch because of MD apps and doesn't take insulin. Overall she notes she is doing much better and BS are much better than they were. She loves her greens and vegetables, . Drinking more water now but still working on cutting out the diet ginger ale. Testing blood sugars 4 times per day before meals. Using the sliding scale Sometimes misses lunch due to dr. Kendrick Fries Cast on her right foot due to a callous and it was removed. She notes her MD said they're trying to keep the callous from coming back. Tissue is healing  Anthropometrics  Wt Readings from Last 3 Encounters:  09/29/20 199 lb (90.3 kg)  08/26/20 198 lb 9.6 oz (90.1 kg)  03/25/20 189 lb (85.7 kg)   Ht Readings from Last 3 Encounters:  09/29/20 5\' 8"  (1.727 m)  08/26/20 5\' 8"  (1.727 m)  03/25/20 5\' 8"  (1.727 m)   There is no height or weight on file to calculate BMI. @BMIFA @ Facility age limit for growth percentiles is 20 years. Facility age limit for growth percentiles is 20 years.   Clinical Medical Hx: HTN, Hyperlipidemia, DM  Medications: Lantus 60 units. And Novolog 10 units plus sliding scale with meals.    Labs:  Lab Results  Component Value Date   HGBA1C 12.4 08/12/2020   CMP Latest Ref Rng & Units 03/23/2020 04/25/2016 04/24/2016  Glucose 70 - 99 mg/dL 317(H) 260(H) -   BUN 6 - 20 mg/dL 20 10 -  Creatinine 0.44 - 1.00 mg/dL 0.95 0.67 0.91  Sodium 135 - 145 mmol/L 138 135 -  Potassium 3.5 - 5.1 mmol/L 4.0 3.5 -  Chloride 98 - 111 mmol/L 104 107 -  CO2 22 - 32 mmol/L 24 18(L) -  Calcium 8.9 - 10.3 mg/dL 9.0 8.4(L) -  Total Protein 6.5 - 8.1 g/dL 6.7 6.5 -  Total Bilirubin 0.3 - 1.2 mg/dL 0.7 1.1 -  Alkaline Phos 38 - 126 U/L 86 115 -  AST 15 - 41 U/L 27 38 -  ALT 0 - 44 U/L 32 42 -   Lipid Panel    Notable Signs/Symptoms:  Fatigue,   Lifestyle & Dietary Hx . Currently has a wound from callous on her right foot. Goes to wound care treatment weekly. Has her foot in a cast. Can't put any weight on it and can't drive.  Estimated daily fluid intake: 90  oz Supplements: none Sleep:5-6 hrs   Stress / self-care:   24-Hr Dietary Recall First Meal: boiled eggs 2, oatmeal Snack:  Second Meal: Salad with ham and egg and 1 slice pizza , Gingerale  Diet Snack:  Third Meal: Chicken, okra, deviled egg,  Diet Gingerale Snack:  Beverages: Diet soda, water,  Estimated Energy Needs Calories: 1500  Carbohydrate: 172g Protein: 112g Fat: 42g   NUTRITION DIAGNOSIS  NB-1.1 Food and nutrition-related knowledge deficit As related to  Diabetes Type 2.  As evidenced by A1C was 10%.   NUTRITION INTERVENTION  Nutrition education (E-1) on the following topics:  . Nutrition and Diabetes education provided on My Plate, CHO counting, meal planning, portion sizes, timing of meals, avoiding snacks between meals unless having a low blood sugar, target ranges for A1C and blood sugars, signs/symptoms and treatment of hyper/hypoglycemia, monitoring blood sugars, taking medications as prescribed, benefits of exercising 30 minutes per day and prevention of complications of DM. Marland Kitchen   Handouts Provided Include   none  Learning Style & Readiness for Change Teaching method utilized: Visual & Auditory  Demonstrated degree of understanding via: Teach Back  Barriers to  learning/adherence to lifestyle change: none  Goals Established by Pt .   Keep up the good job. Do not skip meals. Follow My Plate Eat meals on time Eat 2-3 carb choices per meal Drink only water and work on cutting out diet ginger ale. Increase fresh frutis and vegetables Get A1X down to 7%. Marland Kitchen  MONITORING & EVALUATION Dietary intake, weekly physical activity, and blood sugars  in 3 month She will reschedule her appt with Dr. Dorris Fetch after she gets out of the cast due to not being able to drive.

## 2020-11-01 ENCOUNTER — Encounter (HOSPITAL_BASED_OUTPATIENT_CLINIC_OR_DEPARTMENT_OTHER): Payer: Medicare HMO | Attending: Internal Medicine | Admitting: Physician Assistant

## 2020-11-01 ENCOUNTER — Other Ambulatory Visit: Payer: Self-pay

## 2020-11-01 DIAGNOSIS — L97512 Non-pressure chronic ulcer of other part of right foot with fat layer exposed: Secondary | ICD-10-CM | POA: Insufficient documentation

## 2020-11-01 DIAGNOSIS — E11621 Type 2 diabetes mellitus with foot ulcer: Secondary | ICD-10-CM | POA: Diagnosis present

## 2020-11-01 DIAGNOSIS — E785 Hyperlipidemia, unspecified: Secondary | ICD-10-CM | POA: Diagnosis not present

## 2020-11-01 DIAGNOSIS — I10 Essential (primary) hypertension: Secondary | ICD-10-CM | POA: Insufficient documentation

## 2020-11-01 DIAGNOSIS — I890 Lymphedema, not elsewhere classified: Secondary | ICD-10-CM | POA: Diagnosis not present

## 2020-11-01 DIAGNOSIS — E1142 Type 2 diabetes mellitus with diabetic polyneuropathy: Secondary | ICD-10-CM | POA: Insufficient documentation

## 2020-11-02 NOTE — Progress Notes (Signed)
JULIEANNA, GERACI ([id number]) Visit Report for 11/01/2020 Arrival Information Details Patient Name: Date of Service: [name] 11/01/2020 2:45 PM Medical Record Number: [id number] Patient Account Number: [id number] Date of Birth/Sex: Treating RN: 01/01/1961 (60 y.o. [name] Primary Care Provider: PA TEL, Darel Hong Other Clinician: Referring Provider: Treating Provider/Extender: Worthy Keeler PA TEL, NILA Y Weeks in Treatment: 5 Visit Information History Since Last Visit Added or deleted any medications: No Patient Arrived: Ambulatory Any new allergies or adverse reactions: No Arrival Time: 15:11 Had a fall or experienced change in No Accompanied By: self activities of daily living that may affect Transfer Assistance: None risk of falls: Patient Identification Verified: Yes Signs or symptoms of abuse/neglect since last visito No Secondary Verification Process Completed: Yes Hospitalized since last visit: No Patient Requires Transmission-Based Precautions: No Implantable device outside of the clinic excluding No Patient Has Alerts: No cellular tissue based products placed in the center since last visit: Has Dressing in Place as Prescribed: Yes Pain Present Now: No Electronic Signature(s) Signed: 11/02/2020 11:11:03 AM By: Sandre Kitty Entered By: Sandre Kitty on 11/01/2020 15:12:56 -------------------------------------------------------------------------------- Encounter Discharge Information Details Patient Name: Date of Service: [name], [name] 11/01/2020 2:45 PM Medical Record Number: [id number] Patient Account Number: [id number] Date of Birth/Sex: Treating RN: May 09, 1961 (60 y.o. [name] Primary Care Provider: PA TEL, Darel Hong Other Clinician: Referring Provider: Treating Provider/Extender: Worthy Keeler PA TEL, NILA Y Weeks in Treatment: 5 Encounter Discharge Information Items Post Procedure Vitals Discharge Condition:  Stable Temperature (F): 98.4 Ambulatory Status: Walker Pulse (bpm): 103 Discharge Destination: Home Respiratory Rate (breaths/min): 17 Transportation: Private Auto Blood Pressure (mmHg): 175/68 Accompanied By: self Schedule Follow-up Appointment: Yes Clinical Summary of Care: Electronic Signature(s) Signed: 11/01/2020 5:26:37 PM By: Deon Pilling Entered By: Deon Pilling on 11/01/2020 17:01:34 -------------------------------------------------------------------------------- Lower Extremity Assessment Details Patient Name: Date of Service: [name]. 11/01/2020 2:45 PM Medical Record Number: [id number] Patient Account Number: [id number] Date of Birth/Sex: Treating RN: 1961-04-07 (60 y.o. Tonita Phoenix, Lauren Primary Care Provider: PA TEL, Darel Hong Other Clinician: Referring Provider: Treating Provider/Extender: Worthy Keeler PA TEL, NILA Y Weeks in Treatment: 5 Edema Assessment Assessed: [Left: No] [Right: Yes] Edema: [Left: N] [Right: o] Calf Left: Right: Point of Measurement: 34 cm From Medial Instep 33 cm Ankle Left: Right: Point of Measurement: 11 cm From Medial Instep 23 cm Vascular Assessment Pulses: Dorsalis Pedis Palpable: [Right:Yes] Posterior Tibial Palpable: [Right:Yes] Electronic Signature(s) Signed: 11/01/2020 5:34:21 PM By: Rhae Hammock RN Entered By: Rhae Hammock on 11/01/2020 15:50:19 -------------------------------------------------------------------------------- [place] Details Patient Name: Date of Service: [name], [name] 11/01/2020 2:45 PM Medical Record Number: [id number] Patient Account Number: [id number] Date of Birth/Sex: Treating RN: [birth year] (60 y.o. [name] Primary Care Provider: PA TEL, Darel Hong Other Clinician: Referring Provider: Treating Provider/Extender: Worthy Keeler PA TEL, Ena Dawley in Treatment: [place] reviewed with physician Active  Inactive Nutrition Nursing Diagnoses: Impaired glucose control: actual or potential Potential for alteratiion in Nutrition/Potential for imbalanced nutrition Goals: Patient/caregiver will maintain therapeutic glucose control Date Initiated: 09/24/2020 Target Resolution Date: 11/26/2020 Goal Status: Active Interventions: Assess HgA1c results as ordered upon admission and as needed Assess patient nutrition upon admission and as needed per policy Provide education on elevated blood sugars and impact on wound healing Treatment Activities: Patient referred to Primary Care Physician for further nutritional evaluation : 09/24/2020 Notes: Wound/Skin Impairment Nursing Diagnoses: Impaired tissue  integrity Knowledge deficit related to ulceration/compromised skin integrity Goals: Patient/caregiver will verbalize understanding of skin care regimen Date Initiated: 09/24/2020 Target Resolution Date: 11/26/2020 Goal Status: Active Ulcer/skin breakdown will have a volume reduction of 30% by week 4 Date Initiated: 09/24/2020 Date Inactivated: 10/25/2020 Target Resolution Date: 10/29/2020 Goal Status: Met Ulcer/skin breakdown will have a volume reduction of 50% by week 8 Date Initiated: 10/25/2020 Target Resolution Date: 11/26/2020 Goal Status: Active Interventions: Assess patient/caregiver ability to obtain necessary supplies Assess patient/caregiver ability to perform ulcer/skin care regimen upon admission and as needed Assess ulceration(s) every visit Provide education on ulcer and skin care Treatment Activities: Skin care regimen initiated : 09/24/2020 Topical wound management initiated : 09/24/2020 Notes: Electronic Signature(s) Signed: 11/02/2020 6:01:52 PM By: Levan Hurst RN, BSN Entered By: Levan Hurst on 11/01/2020 16:22:02 -------------------------------------------------------------------------------- Pain Assessment Details Patient Name: Date of Service: [name] 11/01/2020  2:45 PM Medical Record Number: [id number] Patient Account Number: [id number] Date of Birth/Sex: Treating RN: [birth year] (60 y.o. [name] Primary Care Provider: PA TEL, Darel Hong Other Clinician: Referring Provider: Treating Provider/Extender: Worthy Keeler PA TEL, NILA Y Weeks in Treatment: 5 Active Problems Location of Pain Severity and Description of Pain Patient Has Paino No Site Locations Pain Management and Medication Current Pain Management: Electronic Signature(s) Signed: 11/02/2020 11:11:03 AM By: Sandre Kitty Signed: 11/02/2020 6:01:52 PM By: Levan Hurst RN, BSN Entered By: Sandre Kitty on 11/01/2020 15:13:19 -------------------------------------------------------------------------------- Patient/Caregiver Education Details Patient Name: Date of Service: [name] 3/7/2022andnbsp2:45 PM Medical Record Number: [id number] Patient Account Number: [id number] Date of Birth/Gender: Treating RN: [birth year] (60 y.o. [name] Primary Care Physician: PA TEL, Darel Hong Other Clinician: Referring Physician: Treating Physician/Extender: Worthy Keeler PA TEL, Ena Dawley in Treatment: 5 Education Assessment Education Provided To: Patient Education Topics Provided Wound/Skin Impairment: Methods: Explain/Verbal Responses: State content correctly Electronic Signature(s) Signed: 11/02/2020 6:01:52 PM By: Levan Hurst RN, BSN Entered By: Levan Hurst on 11/01/2020 16:22:29 -------------------------------------------------------------------------------- Wound Assessment Details Patient Name: Date of Service: [name] 11/01/2020 2:45 PM Medical Record Number: [id number] Patient Account Number: [id number] Date of Birth/Sex: Treating RN: 06-Sep-1960 (60 y.o. [name] Primary Care Provider: PA TEL, Darel Hong Other Clinician: Referring Provider: Treating Provider/Extender: Worthy Keeler PA TEL, NILA Y Weeks in Treatment:  5 Wound Status Wound Number: 1 Primary Etiology: Diabetic Wound/Ulcer of the Lower Extremity Wound Location: Right Metatarsal head first Wound Status: Open Wounding Event: Gradually Appeared Comorbid History: Hypertension, Type II Diabetes, Neuropathy Date Acquired: 06/28/2020 Weeks Of Treatment: 5 Clustered Wound: No Photos Wound Measurements Length: (cm) 0.3 Width: (cm) 0.5 Depth: (cm) 0.1 Area: (cm) 0.118 Volume: (cm) 0.012 % Reduction in Area: 62.4% % Reduction in Volume: 96.2% Epithelialization: Large (67-100%) Tunneling: No Undermining: No Wound Description Classification: Grade 2 Wound Margin: Thickened Exudate Amount: Medium Exudate Type: Serosanguineous Exudate Color: red, brown Foul Odor After Cleansing: No Slough/Fibrino No Wound Bed Granulation Amount: Large (67-100%) Exposed Structure Granulation Quality: Pink, Pale Fascia Exposed: No Necrotic Amount: None Present (0%) Fat Layer (Subcutaneous Tissue) Exposed: Yes Tendon Exposed: No Muscle Exposed: No Joint Exposed: No Bone Exposed: No Treatment Notes Wound #1 (Metatarsal head first) Wound Laterality: Right Cleanser Wound Cleanser Discharge Instruction: Cleanse the wound with wound cleanser prior to applying a clean dressing using gauze sponges, not tissue or cotton balls. Peri-Wound Care Sween Lotion (Moisturizing lotion) Discharge Instruction: Apply moisturizing lotion as directed Topical Primary Dressing Hydrofera Blue Classic Foam, 2x2  in Discharge Instruction: Moisten with saline prior to applying to wound bed Secondary Dressing Woven Gauze Sponges 2x2 in Discharge Instruction: Apply over primary dressing as directed. Optifoam Non-Adhesive Dressing, 4x4 in Discharge Instruction: Apply over primary dressing cut to make foam donut to help offload Secured With 27M Medipore H Soft Cloth Surgical T 4 x 2 (in/yd) ape Discharge Instruction: Secure dressing with tape as directed. Compression  Wrap Compression Stockings Add-Ons Notes TCC last layer applied by PA. Electronic Signature(s) Signed: 11/02/2020 11:11:03 AM By: Sandre Kitty Signed: 11/02/2020 6:01:52 PM By: Levan Hurst RN, BSN Previous Signature: 11/01/2020 5:34:21 PM Version By: Rhae Hammock RN Entered By: Sandre Kitty on 11/02/2020 10:52:15 -------------------------------------------------------------------------------- Vitals Details Patient Name: Date of Service: [name], [name] 11/01/2020 2:45 PM Medical Record Number: [id number] Patient Account Number: [id number] Date of Birth/Sex: Treating RN: May 13, 1961 (60 y.o. [name] Primary Care Seichi Kaufhold: PA TEL, Darel Hong Other Clinician: Referring Zadaya Cuadra: Treating Blythe Veach/Extender: Worthy Keeler PA TEL, NILA Y Weeks in Treatment: 5 Vital Signs Time Taken: 15:12 Temperature (F): 98.4 Height (in): 68 Pulse (bpm): 103 Weight (lbs): 200 Respiratory Rate (breaths/min): 17 Body Mass Index (BMI): 30.4 Blood Pressure (mmHg): 175/68 Reference Range: 80 - 120 mg / dl Electronic Signature(s) Signed: 11/02/2020 11:11:03 AM By: Sandre Kitty Entered By: Sandre Kitty on 11/01/2020 15:13:12

## 2020-11-02 NOTE — Progress Notes (Addendum)
Nicole, Golden ([id number]) Visit Report for 11/01/2020 Chief Complaint Document Details Patient Name: Date of Service: Nicole Golden 11/01/2020 2:45 PM Medical Record Number: [id number] Patient Account Number: [id number] Date of Birth/Sex: Treating RN: Oct 17, 1960 (60 y.o. Nicole Golden Primary Care Provider: PA TEL, Darel Hong Other Clinician: Referring Provider: Treating Provider/Extender: Worthy Keeler PA TEL, Ena Dawley in Treatment: 5 Information Obtained from: Patient Chief Complaint 09/24/2020; patient is here for review of a ulcer on her right plantar first metatarsal head Electronic Signature(s) Signed: 11/01/2020 2:57:09 PM By: Worthy Keeler PA-C Entered By: Worthy Keeler on 11/01/2020 14:57:09 -------------------------------------------------------------------------------- Debridement Details Patient Name: Date of Service: Nicole Jester J. 11/01/2020 2:45 PM Medical Record Number: [id number] Patient Account Number: [id number] Date of Birth/Sex: Treating RN: 09-22-1960 (60 y.o. Nicole Golden Primary Care Provider: PA TEL, Darel Hong Other Clinician: Referring Provider: Treating Provider/Extender: Worthy Keeler PA TEL, NILA Y Weeks in Treatment: 5 Debridement Performed for Assessment: Wound #1 Right Metatarsal head first Performed By: Physician Worthy Keeler, PA Debridement Type: Debridement Severity of Tissue Pre Debridement: Fat layer exposed Level of Consciousness (Pre-procedure): Awake and Alert Pre-procedure Verification/Time Out Yes - 16:18 Taken: Start Time: 16:18 T Area Debrided (L x W): otal 0.3 (cm) x 0.5 (cm) = 0.15 (cm) Tissue and other material debrided: Viable, Non-Viable, Callus, Slough, Subcutaneous, Slough Level: Skin/Subcutaneous Tissue Debridement Description: Excisional Instrument: Curette Bleeding: Minimum Hemostasis Achieved: Pressure End Time: 16:21 Procedural Pain: 0 Post Procedural Pain: 0 Response to Treatment: Procedure  was tolerated well Level of Consciousness (Post- Awake and Alert procedure): Post Debridement Measurements of Total Wound Length: (cm) 0.3 Width: (cm) 0.5 Depth: (cm) 0.1 Volume: (cm) 0.012 Character of Wound/Ulcer Post Debridement: Improved Severity of Tissue Post Debridement: Fat layer exposed Post Procedure Diagnosis Same as Pre-procedure Electronic Signature(s) Signed: 11/01/2020 5:52:07 PM By: Worthy Keeler PA-C Signed: 11/02/2020 6:01:52 PM By: Levan Hurst RN, BSN Entered By: Levan Hurst on 11/01/2020 16:19:54 -------------------------------------------------------------------------------- HPI Details Patient Name: Date of Service: Nicole Jester J. 11/01/2020 2:45 PM Medical Record Number: [id number] Patient Account Number: [id number] Date of Birth/Sex: Treating RN: 1961/04/17 (60 y.o. Nicole Golden Primary Care Provider: PA TEL, Darel Hong Other Clinician: Referring Provider: Treating Provider/Extender: Worthy Keeler PA TEL, Ena Dawley in Treatment: 5 History of Present Illness HPI Description: Admission 09/24/2020 This is a 60 year old woman with type 2 diabetes poorly controlled and polyneuropathy. She has been been dealing with an area on her right first plantar metatarsal head for about 2 months. She has been following [facility] using Santyl and callus debridements. 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 She has been using Santyl for 2 months she has had x-rays done in the podiatrist office there was apparently nothing noted although I am not able to review this. She has not been on antibiotics. Looking through [facility] health link she has had wounds on both feet in the past for which she is followed with Dr.Petery at [facility]. Past medical history includes type 2 diabetes with most recent hemoglobin A1c of 12.4, diabetic peripheral  neuropathy, hypertension, hyperlipidemia, lichen simplex chronicus ABI in our clinic was 0.91 on the right. 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 She is not working currently normally works at [employer]. She said she can hold out to the end of the month. The wound measures slightly smaller today we use silver alginate. She did not tolerate a forefoot offloading boot we have her in a surgical shoe with felt offloading 2/11; right first metatarsal head the wound looks clean although there is some depth surrounding thick skin and callus. I did not debride this. 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 She did not tolerate a forefoot off loader because of balance issues 2/14; follow-up total contact cast reapplied other than slippery on her hardwood floors she had no specific concerns. We did not look at the wound today 2/21; right first metatarsal head. She is tolerating the total contact cast and the wound is measuring slightly smaller. We have been using silver collagen 2/28 right first metatarsal head. Again thick buildup of callus. With careful probing the open wound extends under the callus. This required a debridement. We have been using silver collagen I am going to change to [facility] 11/01/2020 upon evaluation today patient appears to be doing well with regard to her wound. She is showing some signs of improvement there is a new skin growth which is great news. There does not appear to be any evidence of active infection which is also great news. No fevers, chills, nausea, vomiting, or diarrhea. With that being said I do feel like that the patient is making good progress overall and I think the cast is beneficial for her here. She does have quite a bit of callus and I work on getting that pared down today I do think the [employer] is  doing a good job for her. Electronic Signature(s) Signed: 11/01/2020 4:33:13 PM By: Worthy Keeler PA-C Entered By: Worthy Keeler on 11/01/2020 16:33:13 -------------------------------------------------------------------------------- Physical Exam Details Patient Name: Date of Service: Nicole Golden 11/01/2020 2:45 PM Medical Record Number: [id number] Patient Account Number: [id number] Date of Birth/Sex: Treating RN: 1960/10/23 (60 y.o. Nicole Golden Primary Care Provider: PA TEL, Darel Hong Other Clinician: Referring Provider: Treating Provider/Extender: Worthy Keeler PA TEL, NILA Y Weeks in Treatment: 5 Constitutional Well-nourished and well-hydrated in no acute distress. Respiratory normal breathing without difficulty. Psychiatric this patient is able to make decisions and demonstrates good insight into disease process. Alert and Oriented x 3. pleasant and cooperative. Notes Upon inspection patient's wound bed actually showed signs of good granulation and epithelization at this point. There was quite a bit of callus around the edges of the wound I did perform sharp debridement clear that away today she tolerated that without complication there was no bleeding during the removal of the callus. Subsequently she did have some bleeding with cleaning off of the surface of the wound down to good subcutaneous tissue. Overall that appears to be doing much better post debridement. Electronic Signature(s) Signed: 11/01/2020 4:33:44 PM By: Worthy Keeler PA-C Entered By: Worthy Keeler on 11/01/2020 16:33:44 -------------------------------------------------------------------------------- Physician Orders Details Patient Name: Date of Service: Nicole Golden 11/01/2020 2:45 PM Medical Record Number: [id number] Patient Account Number: [id number] Date of Birth/Sex: Treating RN: [birth year] (60 y.o. Nicole Golden Primary Care Provider: PA TEL, Darel Hong Other Clinician: Referring  Provider: Treating Provider/Extender: Worthy Keeler PA TEL, Ena Dawley in Treatment: 5 Verbal / Phone Orders: No Diagnosis Coding ICD-10 Coding Code Description  E11.621 Type 2 diabetes mellitus with foot ulcer L97.512 Non-pressure chronic ulcer of other part of right foot with fat layer exposed E11.42 Type 2 diabetes mellitus with diabetic polyneuropathy Follow-up Appointments Return Appointment in 1 week. Bathing/ Shower/ Hygiene May shower with protection but do not get wound dressing(s) wet. Off-Loading Total Contact Cast to Right Lower Extremity Wound Treatment Wound #1 - Metatarsal head first Wound Laterality: Right Cleanser: Wound Cleanser 1 x Per Week/7 Days Discharge Instructions: Cleanse the wound with wound cleanser prior to applying a clean dressing using gauze sponges, not tissue or cotton balls. Peri-Wound Care: Sween Lotion (Moisturizing lotion) 1 x Per Week/7 Days Discharge Instructions: Apply moisturizing lotion as directed Prim Dressing: Hydrofera Blue Classic Foam, 2x2 in 1 x Per Week/7 Days ary Discharge Instructions: Moisten with saline prior to applying to wound bed Secondary Dressing: Woven Gauze Sponges 2x2 in 1 x Per Week/7 Days Discharge Instructions: Apply over primary dressing as directed. Secondary Dressing: Optifoam Non-Adhesive Dressing, 4x4 in 1 x Per Week/7 Days Discharge Instructions: Apply over primary dressing cut to make foam donut to help offload Secured With: 7M Medipore H Soft Cloth Surgical T 4 x 2 (in/yd) 1 x Per Week/7 Days ape Discharge Instructions: Secure dressing with tape as directed. Electronic Signature(s) Signed: 11/01/2020 5:52:07 PM By: Worthy Keeler PA-C Signed: 11/02/2020 6:01:52 PM By: Levan Hurst RN, BSN Entered By: Levan Hurst on 11/01/2020 16:18:20 -------------------------------------------------------------------------------- Problem List Details Patient Name: Date of Service: Nicole Golden 11/01/2020 2:45  PM Medical Record Number: [id number] Patient Account Number: [id number] Date of Birth/Sex: Treating RN: May 10, 1961 (60 y.o. Nicole Golden Primary Care Provider: PA TEL, Darel Hong Other Clinician: Referring Provider: Treating Provider/Extender: Worthy Keeler PA TEL, Ena Dawley in Treatment: 5 Active Problems ICD-10 Encounter Code Description Active Date MDM Diagnosis E11.621 Type 2 diabetes mellitus with foot ulcer 09/24/2020 No Yes L97.512 Non-pressure chronic ulcer of other part of right foot with fat layer exposed 09/24/2020 No Yes E11.42 Type 2 diabetes mellitus with diabetic polyneuropathy 09/24/2020 No Yes Inactive Problems Resolved Problems Electronic Signature(s) Signed: 11/01/2020 2:56:59 PM By: Worthy Keeler PA-C Entered By: Worthy Keeler on 11/01/2020 14:56:59 -------------------------------------------------------------------------------- Progress Note Details Patient Name: Date of Service: Nicole Golden. 11/01/2020 2:45 PM Medical Record Number: [id number] Patient Account Number: [id number] Date of Birth/Sex: Treating RN: May 28, 1961 (60 y.o. Nicole Golden Primary Care Provider: PA Kristen Loader Other Clinician: Referring Provider: Treating Provider/Extender: Worthy Keeler PA TEL, Ena Dawley in Treatment: 5 Subjective Chief Complaint Information obtained from Patient 09/24/2020; patient is here for review of a ulcer on her right plantar first metatarsal head History of Present Illness (HPI) Admission 09/24/2020 This is a 60 year old woman with type 2 diabetes poorly controlled and polyneuropathy. She has been been dealing with an area on her right first plantar metatarsal head for about 2 months. She has been following at [facility] using Santyl and callus debridements. 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 She has been using Santyl for 2  months she has had x-rays done in the podiatrist office there was apparently nothing noted although I am not able to review this. She has not been on antibiotics. Looking through [facility] health link she has had wounds on both feet in the past for which she is followed with Dr.Petery  at [facility]. Past medical history includes type 2 diabetes with most recent hemoglobin A1c of 12.4, diabetic peripheral neuropathy, hypertension, hyperlipidemia, lichen simplex chronicus ABI in our clinic was 0.91 on the right. 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 She is not working currently normally works at [employer]. She said she can hold out to the end of the month. The wound measures slightly smaller today we use silver alginate. She did not tolerate a forefoot offloading boot we have her in a surgical shoe with felt offloading 2/11; right first metatarsal head the wound looks clean although there is some depth surrounding thick skin and callus. I did not debride this. 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 She did not tolerate a forefoot off loader because of balance issues 2/14; follow-up total contact cast reapplied other than slippery on her hardwood floors she had no specific concerns. We did not look at the wound today 2/21; right first metatarsal head. She is tolerating the total contact cast and the wound is measuring slightly smaller. We have been using silver collagen 2/28 right first metatarsal head. Again thick buildup of callus. With careful probing the open wound extends under the callus. This required a debridement. We have been using silver collagen I am going to change to [facility] 11/01/2020 upon evaluation today patient appears to be doing well with regard to her wound. She is showing some signs of improvement there is a new  skin growth which is great news. There does not appear to be any evidence of active infection which is also great news. No fevers, chills, nausea, vomiting, or diarrhea. With that being said I do feel like that the patient is making good progress overall and I think the cast is beneficial for her here. She does have quite a bit of callus and I work on getting that pared down today I do think the [employer] is doing a good job for her. Objective Constitutional Well-nourished and well-hydrated in no acute distress. Vitals Time Taken: 3:12 PM, Height: 68 in, Weight: 200 lbs, BMI: 30.4, Temperature: 98.4 F, Pulse: 103 bpm, Respiratory Rate: 17 breaths/min, Blood Pressure: 175/68 mmHg. Respiratory normal breathing without difficulty. Psychiatric this patient is able to make decisions and demonstrates good insight into disease process. Alert and Oriented x 3. pleasant and cooperative. General Notes: Upon inspection patient's wound bed actually showed signs of good granulation and epithelization at this point. There was quite a bit of callus around the edges of the wound I did perform sharp debridement clear that away today she tolerated that without complication there was no bleeding during the removal of the callus. Subsequently she did have some bleeding with cleaning off of the surface of the wound down to good subcutaneous tissue. Overall that appears to be doing much better post debridement. Integumentary (Hair, Skin) Wound #1 status is Open. Original cause of wound was Gradually Appeared. The date acquired was: 06/28/2020. The wound has been in treatment 5 weeks. The wound is located on the Right Metatarsal head first. The wound measures 0.3cm length x 0.5cm width x 0.1cm depth; 0.118cm^2 area and 0.012cm^3 volume. There is Fat Layer (Subcutaneous Tissue) exposed. There is no tunneling or undermining noted. There is a medium amount of serosanguineous drainage noted. The wound margin is  thickened. There is  large (67-100%) pink, pale granulation within the wound bed. There is no necrotic tissue within the wound bed. Assessment Active Problems ICD-10 Type 2 diabetes mellitus with foot ulcer Non-pressure chronic ulcer of other part of right foot with fat layer exposed Type 2 diabetes mellitus with diabetic polyneuropathy Procedures Wound #1 Pre-procedure diagnosis of Wound #1 is a Diabetic Wound/Ulcer of the Lower Extremity located on the Right Metatarsal head first .Severity of Tissue Pre Debridement is: Fat layer exposed. There was a Excisional Skin/Subcutaneous Tissue Debridement with a total area of 0.15 sq cm performed by Worthy Keeler, PA. With the following instrument(s): Curette to remove Viable and Non-Viable tissue/material. Material removed includes Callus, Subcutaneous Tissue, and Slough. No specimens were taken. A time out was conducted at 16:18, prior to the start of the procedure. A Minimum amount of bleeding was controlled with Pressure. The procedure was tolerated well with a pain level of 0 throughout and a pain level of 0 following the procedure. Post Debridement Measurements: 0.3cm length x 0.5cm width x 0.1cm depth; 0.012cm^3 volume. Character of Wound/Ulcer Post Debridement is improved. Severity of Tissue Post Debridement is: Fat layer exposed. Post procedure Diagnosis Wound #1: Same as Pre-Procedure Pre-procedure diagnosis of Wound #1 is a Diabetic Wound/Ulcer of the Lower Extremity located on the Right Metatarsal head first . There was a T Contact otal Cast Procedure by Worthy Keeler, PA. Post procedure Diagnosis Wound #1: Same as Pre-Procedure Plan Follow-up Appointments: Return Appointment in 1 week. Bathing/ Shower/ Hygiene: May shower with protection but do not get wound dressing(s) wet. Off-Loading: T Contact Cast to Right Lower Extremity otal WOUND #1: - Metatarsal head first Wound Laterality: Right Cleanser: Wound Cleanser 1 x Per Week/7  Days Discharge Instructions: Cleanse the wound with wound cleanser prior to applying a clean dressing using gauze sponges, not tissue or cotton balls. Peri-Wound Care: Sween Lotion (Moisturizing lotion) 1 x Per Week/7 Days Discharge Instructions: Apply moisturizing lotion as directed Prim Dressing: Hydrofera Blue Classic Foam, 2x2 in 1 x Per Week/7 Days ary Discharge Instructions: Moisten with saline prior to applying to wound bed Secondary Dressing: Woven Gauze Sponges 2x2 in 1 x Per Week/7 Days Discharge Instructions: Apply over primary dressing as directed. Secondary Dressing: Optifoam Non-Adhesive Dressing, 4x4 in 1 x Per Week/7 Days Discharge Instructions: Apply over primary dressing cut to make foam donut to help offload Secured With: 60M Medipore H Soft Cloth Surgical T 4 x 2 (in/yd) 1 x Per Week/7 Days ape Discharge Instructions: Secure dressing with tape as directed. 1. Would recommend currently that we continue with the [facility] I think that still the best way to go. 2. I am also can recommend we continue with the total contact cast I think that is doing a great job. 3. I am also can recommend that she continue to stay at work for now to allow this to heal appropriately she is in agreement with that plan. We will see patient back for reevaluation in 1 week here in the clinic. If anything worsens or changes patient will contact our office for additional recommendations. Electronic Signature(s) Signed: 11/01/2020 4:34:10 PM By: Worthy Keeler PA-C Entered By: Worthy Keeler on 11/01/2020 16:34:10 -------------------------------------------------------------------------------- Total Contact Cast Details Patient Name: Date of Service: Nicole Golden 11/01/2020 2:45 PM Medical Record Number: [id number] Patient Account Number: [id number] Date of Birth/Sex: Treating RN: 04-18-1961 (60 y.o. Nicole Golden Primary Care Provider: PA TEL, Darel Hong Other Clinician: Referring  Provider: Treating  Provider/Extender: Worthy Keeler PA TEL, NILA Y Weeks in Treatment: 5 T Contact Cast Applied for Wound Assessment: otal Wound #1 Right Metatarsal head first Performed By: Physician Worthy Keeler, PA Post Procedure Diagnosis Same as Pre-procedure Electronic Signature(s) Signed: 11/01/2020 5:52:07 PM By: Worthy Keeler PA-C Signed: 11/02/2020 6:01:52 PM By: Levan Hurst RN, BSN Entered By: Levan Hurst on 11/01/2020 16:20:12 -------------------------------------------------------------------------------- Kimberly Details Patient Name: Date of Service: Nicole Golden 11/01/2020 Medical Record Number: [id number] Patient Account Number: [id number] Date of Birth/Sex: Treating RN: May 31, 1961 (60 y.o. Nicole Golden Primary Care Provider: PA TEL, Darel Hong Other Clinician: Referring Provider: Treating Provider/Extender: Worthy Keeler PA TEL, Ena Dawley in Treatment: 5 Diagnosis Coding ICD-10 Codes Code Description E11.621 Type 2 diabetes mellitus with foot ulcer L97.512 Non-pressure chronic ulcer of other part of right foot with fat layer exposed E11.42 Type 2 diabetes mellitus with diabetic polyneuropathy Facility Procedures CPT4 Code: [id number] Description: 47654 - DEB SUBQ TISSUE 20 SQ CM/< ICD-10 Diagnosis Description L97.512 Non-pressure chronic ulcer of other part of right foot with fat layer exposed Modifier: Quantity: 1 Physician Procedures : CPT4 Code Description Modifier [id number] - WC PHYS SUBQ TISS 20 SQ CM ICD-10 Diagnosis Description L97.512 Non-pressure chronic ulcer of other part of right foot with fat layer exposed Quantity: 1 Electronic Signature(s) Signed: 11/01/2020 4:34:25 PM By: Worthy Keeler PA-C Entered By: Worthy Keeler on 11/01/2020 16:34:24

## 2020-11-10 ENCOUNTER — Encounter (HOSPITAL_BASED_OUTPATIENT_CLINIC_OR_DEPARTMENT_OTHER): Payer: Medicare HMO | Admitting: Physician Assistant

## 2020-11-10 ENCOUNTER — Other Ambulatory Visit: Payer: Self-pay

## 2020-11-10 DIAGNOSIS — E11621 Type 2 diabetes mellitus with foot ulcer: Secondary | ICD-10-CM | POA: Diagnosis not present

## 2020-11-10 NOTE — Progress Notes (Addendum)
KONI, KANNAN ([id number]) Visit Report for 11/10/2020 Chief Complaint Document Details Patient Name: Date of Service: Nicole Golden 11/10/2020 1:00 PM Medical Record Number: [id number] Patient Account Number: [id number] Date of Birth/Sex: Treating RN: 1961/07/25 (60 y.o. [name] Primary Care Provider: PA TEL, Darel Hong Other Clinician: Referring Provider: Treating Provider/Extender: Worthy Keeler PA TEL, Ena Dawley in Treatment: 6 Information Obtained from: Patient Chief Complaint 09/24/2020; patient is here for review of a ulcer on her right plantar first metatarsal head Electronic Signature(s) Signed: 11/10/2020 1:37:49 PM By: Worthy Keeler PA-C Entered By: Worthy Keeler on 11/10/2020 13:37:49 -------------------------------------------------------------------------------- Debridement Details Patient Name: Date of Service: Nicole Golden, Nicole Burns J. 11/10/2020 1:00 PM Medical Record Number: [id number] Patient Account Number: [id number] Date of Birth/Sex: Treating RN: 22-May-1961 (60 y.o. [name] Primary Care Provider: PA TEL, Darel Hong Other Clinician: Referring Provider: Treating Provider/Extender: Worthy Keeler PA TEL, Ena Dawley in Treatment: 6 Debridement Performed for Assessment: Wound #1 Right Metatarsal head first Performed By: Physician Worthy Keeler, PA Debridement Type: Debridement Severity of Tissue Pre Debridement: Fat layer exposed Level of Consciousness (Pre-procedure): Awake and Alert Pre-procedure Verification/Time Out Yes - 14:00 Taken: Start Time: 14:01 T Area Debrided (L x W): otal 0.5 (cm) x 0.5 (cm) = 0.25 (cm) Tissue and other material debrided: Non-Viable, Callus, Skin: Epidermis Level: Skin/Epidermis Debridement Description: Selective/Open Wound Instrument: Curette Bleeding: None End Time: 14:04 Procedural Pain: 0 Post Procedural Pain: 0 Response to Treatment: Procedure was tolerated well Level of Consciousness  (Post- Awake and Alert procedure): Post Debridement Measurements of Total Wound Length: (cm) 0.2 Width: (cm) 0.2 Depth: (cm) 0.1 Volume: (cm) 0.003 Character of Wound/Ulcer Post Debridement: Improved Severity of Tissue Post Debridement: Fat layer exposed Post Procedure Diagnosis Same as Pre-procedure Electronic Signature(s) Signed: 11/10/2020 6:22:43 PM By: Worthy Keeler PA-C Signed: 11/11/2020 5:44:19 PM By: Baruch Gouty RN, BSN Entered By: Baruch Gouty on 11/10/2020 14:05:16 -------------------------------------------------------------------------------- HPI Details Patient Name: Date of Service: Nicole Golden, Nicole Burns J. 11/10/2020 1:00 PM Medical Record Number: [id number] Patient Account Number: [id number] Date of Birth/Sex: Treating RN: 11/25/1960 (60 y.o. [name] Primary Care Provider: PA TEL, Darel Hong Other Clinician: Referring Provider: Treating Provider/Extender: Worthy Keeler PA TEL, Ena Dawley in Treatment: 6 History of Present Illness HPI Description: Admission 09/24/2020 This is a 60 year old woman with type 2 diabetes poorly controlled and polyneuropathy. She has been been dealing with an area on her right first plantar metatarsal head for about 2 months. [facility] has been following [facility] using Santyl and callus debridements. 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 She has been using Santyl for 2 months she has had x-rays done in the podiatrist office there was apparently nothing noted although I am not able to review this. She has not been on antibiotics. Looking through [facility] health link she has had wounds on both feet in the past for which she is followed with Dr.Petery at [facility]. Past medical history includes type 2 diabetes with most recent hemoglobin A1c of 12.4, diabetic peripheral neuropathy, hypertension, hyperlipidemia,  lichen simplex chronicus ABI in our clinic was 0.91 on the right. 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  She is not working currently normally works at [employer]. She said she can hold out to the end of the month. The wound measures slightly smaller today we use silver alginate. She did not tolerate a forefoot offloading boot we have her in a surgical shoe with felt offloading 2/11; right first metatarsal head the wound looks clean although there is some depth surrounding thick skin and callus. I did not debride this. 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 She did not tolerate a forefoot off loader because of balance issues 2/14; follow-up total contact cast reapplied other than slippery on her hardwood floors she had no specific concerns. We did not look at the wound today 2/21; right first metatarsal head. She is tolerating the total contact cast and the wound is measuring slightly smaller. We have been using silver collagen 2/28 right first metatarsal head. Again thick buildup of callus. With careful probing the open wound extends under the callus. This required a debridement. We have been using silver collagen I am going to change to [facility] 11/01/2020 upon evaluation today patient appears to be doing well with regard to her wound. She is showing some signs of improvement there is a new skin growth which is great news. There does not appear to be any evidence of active infection which is also great news. No fevers, chills, nausea, vomiting, or diarrhea. With that being said I do feel like that the patient is making good progress overall and I think the cast is beneficial for her here. She does have quite a bit of callus and I work on getting that pared down today I do think the [employer] is doing a good job for her. 11/10/2020 on  evaluation today patient appears to be doing excellent in regard to her foot ulcer. She has been tolerating the dressing changes including the cast without any complication and though she has a little bit of callus buildup around this is nothing like what it was previous. We have been using Hydrofera Blue up to this point. Electronic Signature(s) Signed: 11/10/2020 2:08:30 PM By: Worthy Keeler PA-C Entered By: Worthy Keeler on 11/10/2020 14:08:30 -------------------------------------------------------------------------------- Physical Exam Details Patient Name: Date of Service: Nicole Jester J. 11/10/2020 1:00 PM Medical Record Number: [id number] Patient Account Number: [id number] Date of Birth/Sex: Treating RN: Dec 15, 1960 (60 y.o. [name] Primary Care Provider: PA TEL, Darel Hong Other Clinician: Referring Provider: Treating Provider/Extender: Worthy Keeler PA TEL, NILA Y Weeks in Treatment: 6 Constitutional Well-nourished and well-hydrated in no acute distress. Respiratory normal breathing without difficulty. Psychiatric this patient is able to make decisions and demonstrates good insight into disease process. Alert and Oriented x 3. pleasant and cooperative. Notes Patient's wound bed did require some debridement again to remove the callus around the edges of the wound which was performed today without any bleeding or complication. No subcutaneous tissue was removed during the debridement at this point. Overall I think she is managing quite nicely and this wound is very close to complete closure and may even be closed next week though advised even so that she will probably need the cast for 1 more additional week past that. Electronic Signature(s) Signed: 11/10/2020 2:08:57 PM By: Worthy Keeler PA-C Entered By: Worthy Keeler on 11/10/2020 14:08:56 -------------------------------------------------------------------------------- Physician Orders Details Patient Name:  Date of Service: Nicole Jester J. 11/10/2020 1:00 PM Medical Record Number: [id number]  Patient Account Number: [id number] Date of Birth/Sex: Treating RN: 06-Jan-1961 (60 y.o. [name] Primary Care Provider: PA TEL, Darel Hong Other Clinician: Referring Provider: Treating Provider/Extender: Worthy Keeler PA TEL, Ena Dawley in Treatment: 6 Verbal / Phone Orders: No Diagnosis Coding ICD-10 Coding Code Description E11.621 Type 2 diabetes mellitus with foot ulcer L97.512 Non-pressure chronic ulcer of other part of right foot with fat layer exposed E11.42 Type 2 diabetes mellitus with diabetic polyneuropathy Follow-up Appointments Return Appointment in 1 week. Bathing/ Shower/ Hygiene May shower with protection but do not get wound dressing(s) wet. Off-Loading Total Contact Cast to Right Lower Extremity Wound Treatment Wound #1 - Metatarsal head first Wound Laterality: Right Cleanser: Wound Cleanser 1 x Per Week/7 Days Discharge Instructions: Cleanse the wound with wound cleanser prior to applying a clean dressing using gauze sponges, not tissue or cotton balls. Peri-Wound Care: Sween Lotion (Moisturizing lotion) 1 x Per Week/7 Days Discharge Instructions: Apply moisturizing lotion to leg and foot Prim Dressing: KerraCel Ag Gelling Fiber Dressing, 2x2 in (silver alginate) 1 x Per Week/7 Days ary Discharge Instructions: Apply silver alginate to wound bed as instructed Secondary Dressing: Woven Gauze Sponges 2x2 in 1 x Per Week/7 Days Discharge Instructions: Apply over primary dressing as directed. Secondary Dressing: Optifoam Non-Adhesive Dressing, 4x4 in 1 x Per Week/7 Days Discharge Instructions: Apply over primary dressing cut to make foam donut to help offload Secured With: 15M Medipore H Soft Cloth Surgical T 4 x 2 (in/yd) 1 x Per Week/7 Days ape Discharge Instructions: Secure dressing with tape as directed. Electronic Signature(s) Signed: 11/10/2020 6:22:43 PM By: Worthy Keeler PA-C Signed: 11/11/2020 5:44:19 PM By: Baruch Gouty RN, BSN Entered By: Baruch Gouty on 11/10/2020 14:07:32 -------------------------------------------------------------------------------- Problem List Details Patient Name: Date of Service: Nicole Golden, Nicole Burns J. 11/10/2020 1:00 PM Medical Record Number: [id number] Patient Account Number: [id number] Date of Birth/Sex: Treating RN: 1960-11-16 (60 y.o. [name] Primary Care Provider: PA TEL, Darel Hong Other Clinician: Referring Provider: Treating Provider/Extender: Worthy Keeler PA TEL, Ena Dawley in Treatment: 6 Active Problems ICD-10 Encounter Code Description Active Date MDM Diagnosis E11.621 Type 2 diabetes mellitus with foot ulcer 09/24/2020 No Yes L97.512 Non-pressure chronic ulcer of other part of right foot with fat layer exposed 09/24/2020 No Yes E11.42 Type 2 diabetes mellitus with diabetic polyneuropathy 09/24/2020 No Yes Inactive Problems Resolved Problems Electronic Signature(s) Signed: 11/10/2020 1:37:42 PM By: Worthy Keeler PA-C Entered By: Worthy Keeler on 11/10/2020 13:37:42 -------------------------------------------------------------------------------- Progress Note Details Patient Name: Date of Service: Nicole Golden, Nicole Burns J. 11/10/2020 1:00 PM Medical Record Number: [id number] Patient Account Number: [id number] Date of Birth/Sex: Treating RN: 02-Dec-1960 (60 y.o. [name] Primary Care Provider: PA TEL, Darel Hong Other Clinician: Referring Provider: Treating Provider/Extender: Worthy Keeler PA TEL, Ena Dawley in Treatment: 6 Subjective Chief Complaint Information obtained from Patient 09/24/2020; patient is here for review of a ulcer on her right plantar first metatarsal head History of Present Illness (HPI) Admission 09/24/2020 This is a 60 year old woman with type 2 diabetes poorly controlled and polyneuropathy. She has been been dealing with an area on her right first  plantar metatarsal head for about 2 months. She has been following at [facility] using Santyl and callus debridements. 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 She has been using  Santyl for 2 months she has had x-rays done in the podiatrist office there was apparently nothing noted although I am not able to review this. She has not been on antibiotics. Looking through [facility] health link she has had wounds on both feet in the past for which she is followed with Dr[facility]Petery at [facility]. Past medical history includes type 2 diabetes with most recent hemoglobin A1c of 12.4, diabetic peripheral neuropathy, hypertension, hyperlipidemia, lichen simplex chronicus ABI in our clinic was 0.91 on the right. 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 She is not working currently normally works at [employer]. She said she can hold out to the end of the month. The wound measures slightly smaller today we use silver alginate. She did not tolerate a forefoot offloading boot we have her in a surgical shoe with felt offloading 2/11; right first metatarsal head the wound looks clean although there is some depth surrounding thick skin and callus. I did not debride this. 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 She did not tolerate a forefoot off loader because of balance issues 2/14; follow-up total contact cast reapplied other than slippery on her hardwood floors she had no specific concerns. We did not look at the wound today 2/21; right first metatarsal head. She is tolerating the total contact cast and the wound is measuring slightly smaller. We have been using silver collagen 2/28 right first metatarsal head. Again thick buildup of callus. With  careful probing the open wound extends under the callus. This required a debridement. We have been using silver collagen I am going to change to [facility] 11/01/2020 upon evaluation today patient appears to be doing well with regard to her wound. She is showing some signs of improvement there is a new skin growth which is great news. There does not appear to be any evidence of active infection which is also great news. No fevers, chills, nausea, vomiting, or diarrhea. With that being said I do feel like that the patient is making good progress overall and I think the cast is beneficial for her here. She does have quite a bit of callus and I work on getting that pared down today I do think the [employer] is doing a good job for her. 11/10/2020 on evaluation today patient appears to be doing excellent in regard to her foot ulcer. She has been tolerating the dressing changes including the cast without any complication and though she has a little bit of callus buildup around this is nothing like what it was previous. We have been using Hydrofera Blue up to this point. Objective Constitutional Well-nourished and well-hydrated in no acute distress. Vitals Time Taken: 1:12 PM, Height: 68 in, Weight: 200 lbs, BMI: 30.4, Temperature: 98.4 F, Pulse: 97 bpm, Respiratory Rate: 18 breaths/min, Blood Pressure: 167/72 mmHg, Capillary Blood Glucose: 160 mg/dl. Respiratory normal breathing without difficulty. Psychiatric this patient is able to make decisions and demonstrates good insight into disease process. Alert and Oriented x 3. pleasant and cooperative. General Notes: Patient's wound bed did require some debridement again to remove the callus around the edges of the wound which was performed today without any bleeding or complication. No subcutaneous tissue was removed during the debridement at this point. Overall I think she is managing quite nicely and this wound is very  close to complete closure  and may even be closed next week though advised even so that she will probably need the cast for 1 more additional week past that. Integumentary (Hair, Skin) Wound #1 status is Open. Original cause of wound was Gradually Appeared. The date acquired was: 06/28/2020. The wound has been in treatment 6 weeks. The wound is located on the Right Metatarsal head first. The wound measures 0.2cm length x 0.2cm width x 0.3cm depth; 0.031cm^2 area and 0.009cm^3 volume. There is Fat Layer (Subcutaneous Tissue) exposed. There is no tunneling or undermining noted. There is a medium amount of serosanguineous drainage noted. The wound margin is thickened. There is large (67-100%) pink, pale granulation within the wound bed. There is no necrotic tissue within the wound bed. Assessment Active Problems ICD-10 Type 2 diabetes mellitus with foot ulcer Non-pressure chronic ulcer of other part of right foot with fat layer exposed Type 2 diabetes mellitus with diabetic polyneuropathy Procedures Wound #1 Pre-procedure diagnosis of Wound #1 is a Diabetic Wound/Ulcer of the Lower Extremity located on the Right Metatarsal head first .Severity of Tissue Pre Debridement is: Fat layer exposed. There was a Selective/Open Wound Skin/Epidermis Debridement with a total area of 0.25 sq cm performed by Worthy Keeler, PA. With the following instrument(s): Curette to remove Non-Viable tissue/material. Material removed includes Callus and Skin: Epidermis and. No specimens were taken. A time out was conducted at 14:00, prior to the start of the procedure. There was no bleeding. The procedure was tolerated well with a pain level of 0 throughout and a pain level of 0 following the procedure. Post Debridement Measurements: 0.2cm length x 0.2cm width x 0.1cm depth; 0.003cm^3 volume. Character of Wound/Ulcer Post Debridement is improved. Severity of Tissue Post Debridement is: Fat layer exposed. Post procedure Diagnosis Wound #1: Same as  Pre-Procedure Pre-procedure diagnosis of Wound #1 is a Diabetic Wound/Ulcer of the Lower Extremity located on the Right Metatarsal head first . There was a T Contact otal Cast Procedure by Worthy Keeler, PA. Post procedure Diagnosis Wound #1: Same as Pre-Procedure Plan Follow-up Appointments: Return Appointment in 1 week. Bathing/ Shower/ Hygiene: May shower with protection but do not get wound dressing(s) wet. Off-Loading: T Contact Cast to Right Lower Extremity otal WOUND #1: - Metatarsal head first Wound Laterality: Right Cleanser: Wound Cleanser 1 x Per Week/7 Days Discharge Instructions: Cleanse the wound with wound cleanser prior to applying a clean dressing using gauze sponges, not tissue or cotton balls. Peri-Wound Care: Sween Lotion (Moisturizing lotion) 1 x Per Week/7 Days Discharge Instructions: Apply moisturizing lotion to leg and foot Prim Dressing: KerraCel Ag Gelling Fiber Dressing, 2x2 in (silver alginate) 1 x Per Week/7 Days ary Discharge Instructions: Apply silver alginate to wound bed as instructed Secondary Dressing: Woven Gauze Sponges 2x2 in 1 x Per Week/7 Days Discharge Instructions: Apply over primary dressing as directed. Secondary Dressing: Optifoam Non-Adhesive Dressing, 4x4 in 1 x Per Week/7 Days Discharge Instructions: Apply over primary dressing cut to make foam donut to help offload Secured With: 52M Medipore H Soft Cloth Surgical T 4 x 2 (in/yd) 1 x Per Week/7 Days ape Discharge Instructions: Secure dressing with tape as directed. 1. Would recommend that we continue with the wound care measures as before we will switch from the [facility] to a silver alginate dressing however. 2. We are also going to reapply the total contact cast and I did apply that myself today as well if she has any concerns or complications with  this she should let us know even if it is in between times so that she does not have any issues though so far she has been tolerating  the cast in excellent fashion. We will see patient back for reevaluation in 1 week here in the clinic. If anything worsens or changes patient will contact our office for additional recommendations. Electronic Signature(s) Signed: 11/10/2020 2:09:26 PM By: Worthy Keeler PA-C Entered By: Worthy Keeler on 11/10/2020 14:09:26 -------------------------------------------------------------------------------- Total Contact Cast Details Patient Name: Date of Service: Nicole Golden 11/10/2020 1:00 PM Medical Record Number: [id number] Patient Account Number: [id number] Date of Birth/Sex: Treating RN: Mar 02, 1961 (60 y.o. [name] Primary Care Provider: PA TEL, Darel Hong Other Clinician: Referring Provider: Treating Provider/Extender: Worthy Keeler PA TEL, Ena Dawley in Treatment: 6 T Contact Cast Applied for Wound Assessment: otal Wound #1 Right Metatarsal head first Performed By: Physician Worthy Keeler, PA Post Procedure Diagnosis Same as Pre-procedure Electronic Signature(s) Signed: 11/10/2020 6:22:43 PM By: Worthy Keeler PA-C Signed: 11/11/2020 5:44:19 PM By: Baruch Gouty RN, BSN Entered By: Baruch Gouty on 11/10/2020 14:03:44 -------------------------------------------------------------------------------- SuperBill Details Patient Name: Date of Service: Nicole Golden, Nicole Burns J. 11/10/2020 Medical Record Number: [id number] Patient Account Number: [id number] Date of Birth/Sex: Treating RN: 01/15/1961 (60 y.o. [name] Primary Care Provider: PA TEL, Darel Hong Other Clinician: Referring Provider: Treating Provider/Extender: Worthy Keeler PA TEL, Ena Dawley in Treatment: 6 Diagnosis Coding ICD-10 Codes Code Description E11.621 Type 2 diabetes mellitus with foot ulcer L97.512 Non-pressure chronic ulcer of other part of right foot with fat layer exposed E11.42 Type 2 diabetes mellitus with diabetic polyneuropathy Facility Procedures CPT4 Code:  [id number] Description: [phone] - DEBRIDE WOUND 1ST 20 SQ CM OR < ICD-10 Diagnosis Description L97.512 Non-pressure chronic ulcer of other part of right foot with fat layer exposed E11.621 Type 2 diabetes mellitus with foot ulcer Modifier: Quantity: 1 Physician Procedures : CPT4 Code Description Modifier [id number] - WC PHYS DEBR WO ANESTH 20 SQ CM ICD-10 Diagnosis Description L97.512 Non-pressure chronic ulcer of other part of right foot with fat layer exposed E11.621 Type 2 diabetes mellitus with foot ulcer Quantity: 1 Electronic Signature(s) Signed: 11/10/2020 2:14:00 PM By: Worthy Keeler PA-C Entered By: Worthy Keeler on 11/10/2020 14:14:00

## 2020-11-11 NOTE — Progress Notes (Signed)
DRUANNE, BOSQUES ([id number]) Visit Report for 11/10/2020 Arrival Information Details Patient Name: Date of Service: Nicole Golden 11/10/2020 1:00 PM Medical Record Number: [id number] Patient Account Number: [id number] Date of Birth/Sex: Treating RN: August 10, 1961 (60 y.o. [name] Primary Care Korvin Valentine: PA TEL, Darel Hong Other Clinician: Referring Percival Glasheen: Treating Tyde Lamison/Extender: Worthy Keeler PA TEL, Ena Dawley in Treatment: 6 Visit Information History Since Last Visit Added or deleted any medications: No Patient Arrived: Gilford Rile Any new allergies or adverse reactions: No Arrival Time: 13:12 Had a fall or experienced change in No Accompanied By: self activities of daily living that may affect Transfer Assistance: None risk of falls: Patient Identification Verified: Yes Signs or symptoms of abuse/neglect since last visito No Secondary Verification Process Completed: Yes Hospitalized since last visit: No Patient Requires Transmission-Based Precautions: No Implantable device outside of the clinic excluding No Patient Has Alerts: No cellular tissue based products placed in the center since last visit: Has Dressing in Place as Prescribed: Yes Has Footwear/Offloading in Place as Prescribed: Yes Right: T Contact Cast otal Pain Present Now: No Electronic Signature(s) Signed: 11/10/2020 4:41:44 PM By: Deon Pilling Entered By: Deon Pilling on 11/10/2020 13:30:06 -------------------------------------------------------------------------------- Encounter Discharge Information Details Patient Name: Date of Service: Nicole Golden, Nicole Burns J. 11/10/2020 1:00 PM Medical Record Number: [id number] Patient Account Number: [id number] Date of Birth/Sex: Treating RN: [birth year] (60 y.o. [name] Primary Care Karesa Maultsby: PA TEL, Darel Hong Other Clinician: Referring Argyle Gustafson: Treating Sarahjane Matherly/Extender: Worthy Keeler PA TEL, NILA Y Weeks in Treatment: 6 Encounter Discharge  Information Items Post Procedure Vitals Discharge Condition: Stable Temperature (F): 98.4 Ambulatory Status: Walker Pulse (bpm): 97 Discharge Destination: Home Respiratory Rate (breaths/min): 18 Transportation: Private Auto Blood Pressure (mmHg): 167/72 Accompanied By: self Schedule Follow-up Appointment: Yes Clinical Summary of Care: Electronic Signature(s) Signed: 11/10/2020 4:41:44 PM By: Deon Pilling Entered By: Deon Pilling on 11/10/2020 14:24:09 -------------------------------------------------------------------------------- Lower Extremity Assessment Details Patient Name: Date of Service: Nicole Jester J. 11/10/2020 1:00 PM Medical Record Number: [id number] Patient Account Number: [id number] Date of Birth/Sex: Treating RN: October 30, 1960 (60 y.o. [name] Primary Care Tashea Othman: PA TEL, Darel Hong Other Clinician: Referring Laelle Bridgett: Treating Viola Placeres/Extender: Worthy Keeler PA TEL, NILA Y Weeks in Treatment: 6 Edema Assessment Assessed: [Left: No] [Right: Yes] Edema: [Left: N] [Right: o] Calf Left: Right: Point of Measurement: 34 cm From Medial Instep 33.5 cm Ankle Left: Right: Point of Measurement: 11 cm From Medial Instep 23.2 cm Vascular Assessment Pulses: Dorsalis Pedis Palpable: [Right:Yes] Electronic Signature(s) Signed: 11/10/2020 4:41:44 PM By: Deon Pilling Entered By: Deon Pilling on 11/10/2020 13:30:55 -------------------------------------------------------------------------------- Multi-Disciplinary Care Plan Details Patient Name: Date of Service: Nicole Golden, Nicole Burns J. 11/10/2020 1:00 PM Medical Record Number: [id number] Patient Account Number: [id number] Date of Birth/Sex: Treating RN: [birth year] (60 y.o. [name] Primary Care Shweta Aman: PA TEL, Darel Hong Other Clinician: Referring Chiann Goffredo: Treating Jadriel Saxer/Extender: Worthy Keeler PA TEL, Ena Dawley in Treatment: [place] reviewed with physician Active  Inactive Nutrition Nursing Diagnoses: Impaired glucose control: actual or potential Potential for alteratiion in Nutrition/Potential for imbalanced nutrition Goals: Patient/caregiver will maintain therapeutic glucose control Date Initiated: 09/24/2020 Target Resolution Date: 11/26/2020 Goal Status: Active Interventions: Assess HgA1c results as ordered upon admission and as needed Assess patient nutrition upon admission and as needed per policy Provide education on elevated blood sugars and impact on wound healing Treatment Activities: Patient referred to Primary Care Physician for further nutritional evaluation : 09/24/2020  Notes: Wound/Skin Impairment Nursing Diagnoses: Impaired tissue integrity Knowledge deficit related to ulceration/compromised skin integrity Goals: Patient/caregiver will verbalize understanding of skin care regimen Date Initiated: 09/24/2020 Target Resolution Date: 11/26/2020 Goal Status: Active Ulcer/skin breakdown will have a volume reduction of 30% by week 4 Date Initiated: 09/24/2020 Date Inactivated: 10/25/2020 Target Resolution Date: 10/29/2020 Goal Status: Met Ulcer/skin breakdown will have a volume reduction of 50% by week 8 Date Initiated: 10/25/2020 Target Resolution Date: 11/26/2020 Goal Status: Active Interventions: Assess patient/caregiver ability to obtain necessary supplies Assess patient/caregiver ability to perform ulcer/skin care regimen upon admission and as needed Assess ulceration(s) every visit Provide education on ulcer and skin care Treatment Activities: Skin care regimen initiated : 09/24/2020 Topical wound management initiated : 09/24/2020 Notes: Electronic Signature(s) Signed: 11/11/2020 5:44:19 PM By: Baruch Gouty RN, BSN Entered By: Baruch Gouty on 11/10/2020 13:55:09 -------------------------------------------------------------------------------- Pain Assessment Details Patient Name: Date of Service: Nicole Jester J.  11/10/2020 1:00 PM Medical Record Number: [id number] Patient Account Number: [id number] Date of Birth/Sex: Treating RN: 1961-04-04 (60 y.o. [name] Primary Care Clanton Emanuelson: PA TEL, Darel Hong Other Clinician: Referring Elmarie Devlin: Treating Alera Quevedo/Extender: Worthy Keeler PA TEL, NILA Y Weeks in Treatment: 6 Active Problems Location of Pain Severity and Description of Pain Patient Has Paino No Site Locations Rate the pain. Rate the pain. Current Pain Level: 0 Pain Management and Medication Current Pain Management: Medication: No Cold Application: No Rest: No Massage: No Activity: No T.E.N.S.: No Heat Application: No Leg drop or elevation: No Is the Current Pain Management Adequate: Adequate How does your wound impact your activities of daily livingo Sleep: No Bathing: No Appetite: No Relationship With Others: No Bladder Continence: No Emotions: No Bowel Continence: No Work: No Toileting: No Drive: No Dressing: No Hobbies: No Electronic Signature(s) Signed: 11/10/2020 4:41:44 PM By: Deon Pilling Entered By: Deon Pilling on 11/10/2020 13:30:44 -------------------------------------------------------------------------------- Patient/Caregiver Education Details Patient Name: Date of Service: Nicole Golden 3/16/2022andnbsp1:00 PM Medical Record Number: [id number] Patient Account Number: [id number] Date of Birth/Gender: Treating RN: 1960-11-26 (60 y.o. [name] Primary Care Physician: PA TEL, Darel Hong Other Clinician: Referring Physician: Treating Physician/Extender: Worthy Keeler PA TEL, Ena Dawley in Treatment: 6 Education Assessment Education Provided To: Patient Education Topics Provided Elevated Blood Sugar/ Impact on Healing: Methods: Explain/Verbal Responses: Reinforcements needed, State content correctly Offloading: Methods: Explain/Verbal Responses: Reinforcements needed, State content correctly Wound/Skin Impairment: Methods:  Explain/Verbal Responses: Reinforcements needed, State content correctly Electronic Signature(s) Signed: 11/11/2020 5:44:19 PM By: Baruch Gouty RN, BSN Entered By: Baruch Gouty on 11/10/2020 13:55:34 -------------------------------------------------------------------------------- Wound Assessment Details Patient Name: Date of Service: Nicole Jester J. 11/10/2020 1:00 PM Medical Record Number: [id number] Patient Account Number: [id number] Date of Birth/Sex: Treating RN: 05/07/1961 (60 y.o. [name], Meta.Reding Primary Care Auguste Tebbetts: PA TEL, Darel Hong Other Clinician: Referring Yvette Roark: Treating Alyanna Stoermer/Extender: Worthy Keeler PA TEL, NILA Y Weeks in Treatment: 6 Wound Status Wound Number: 1 Primary Etiology: Diabetic Wound/Ulcer of the Lower Extremity Wound Location: Right Metatarsal head first Wound Status: Open Wounding Event: Gradually Appeared Comorbid History: Hypertension, Type II Diabetes, Neuropathy Date Acquired: 06/28/2020 Weeks Of Treatment: 6 Clustered Wound: No Photos Photo Uploaded By: Sandre Kitty on 11/10/2020 16:32:58 Wound Measurements Length: (cm) 0.2 Width: (cm) 0.2 Depth: (cm) 0.3 Area: (cm) 0.031 Volume: (cm) 0.009 % Reduction in Area: 90.1% % Reduction in Volume: 97.1% Epithelialization: Large (67-100%) Tunneling: No Undermining: No Wound Description Classification: Grade 2 Wound Margin: Thickened Exudate Amount: Medium Exudate  Type: Serosanguineous Exudate Color: red, brown Foul Odor After Cleansing: No Slough/Fibrino No Wound Bed Granulation Amount: Large (67-100%) Exposed Structure Granulation Quality: Pink, Pale Fascia Exposed: No Necrotic Amount: None Present (0%) Fat Layer (Subcutaneous Tissue) Exposed: Yes Tendon Exposed: No Muscle Exposed: No Joint Exposed: No Bone Exposed: No Treatment Notes Wound #1 (Metatarsal head first) Wound Laterality: Right Cleanser Wound Cleanser Discharge Instruction: Cleanse the wound  with wound cleanser prior to applying a clean dressing using gauze sponges, not tissue or cotton balls. Peri-Wound Care Sween Lotion (Moisturizing lotion) Discharge Instruction: Apply moisturizing lotion to leg and foot Topical Primary Dressing KerraCel Ag Gelling Fiber Dressing, 2x2 in (silver alginate) Discharge Instruction: Apply silver alginate to wound bed as instructed Secondary Dressing Woven Gauze Sponges 2x2 in Discharge Instruction: Apply over primary dressing as directed. Optifoam Non-Adhesive Dressing, 4x4 in Discharge Instruction: Apply over primary dressing cut to make foam donut to help offload Secured With 41M Medipore H Soft Cloth Surgical T 4 x 2 (in/yd) ape Discharge Instruction: Secure dressing with tape as directed. Compression Wrap Compression Stockings Add-Ons Notes TCC last layer applied by PA. Electronic Signature(s) Signed: 11/10/2020 4:41:44 PM By: Deon Pilling Entered By: Deon Pilling on 11/10/2020 13:31:23 -------------------------------------------------------------------------------- Vitals Details Patient Name: Date of Service: Nicole Golden, Nicole Burns J. 11/10/2020 1:00 PM Medical Record Number: [id number] Patient Account Number: [id number] Date of Birth/Sex: Treating RN: Mar 31, 1961 (60 y.o. [name], Meta.Reding Primary Care Jaydian Santana: PA TEL, Darel Hong Other Clinician: Referring Toron Bowring: Treating Kristyna Bradstreet/Extender: Worthy Keeler PA TEL, NILA Y Weeks in Treatment: 6 Vital Signs Time Taken: 13:12 Temperature (F): 98.4 Height (in): 68 Pulse (bpm): 97 Weight (lbs): 200 Respiratory Rate (breaths/min): 18 Body Mass Index (BMI): 30.4 Blood Pressure (mmHg): 167/72 Capillary Blood Glucose (mg/dl): 160 Reference Range: 80 - 120 mg / dl Electronic Signature(s) Signed: 11/10/2020 4:41:44 PM By: Deon Pilling Entered By: Deon Pilling on 11/10/2020 13:30:30

## 2020-11-17 ENCOUNTER — Encounter (HOSPITAL_BASED_OUTPATIENT_CLINIC_OR_DEPARTMENT_OTHER): Payer: Medicare HMO | Admitting: Physician Assistant

## 2020-11-17 ENCOUNTER — Other Ambulatory Visit: Payer: Self-pay

## 2020-11-17 DIAGNOSIS — E11621 Type 2 diabetes mellitus with foot ulcer: Secondary | ICD-10-CM | POA: Diagnosis not present

## 2020-11-17 NOTE — Progress Notes (Addendum)
NANDITA, MATHENIA ([id number]) Visit Report for 11/17/2020 Chief Complaint Document Details Patient Name: Date of Service: [name] 11/17/2020 12:45 PM Medical Record Number: [id number] Patient Account Number: [id number] Date of Birth/Sex: Treating RN: 1961-04-07 (60 y.o. [name] Primary Care Provider: PA TEL, Darel Hong Other Clinician: Referring Provider: Treating Provider/Extender: Worthy Keeler PA TEL, Ena Dawley in Treatment: 7 Information Obtained from: Patient Chief Complaint 09/24/2020; patient is here for review of a ulcer on her right plantar first metatarsal head Electronic Signature(s) Signed: 11/17/2020 1:07:24 PM By: Worthy Keeler PA-C Entered By: Worthy Keeler on 11/17/2020 13:07:24 -------------------------------------------------------------------------------- Debridement Details Patient Name: Date of Service: [name] 11/17/2020 12:45 PM Medical Record Number: [id number] Patient Account Number: [id number] Date of Birth/Sex: Treating RN: [birth year] (60 y.o. [name] Primary Care Provider: PA TEL, Darel Hong Other Clinician: Referring Provider: Treating Provider/Extender: Worthy Keeler PA TEL, Ena Dawley in Treatment: 7 Debridement Performed for Assessment: Wound #1 Right Metatarsal head first Performed By: Physician Worthy Keeler, PA Debridement Type: Debridement Severity of Tissue Pre Debridement: Fat layer exposed Level of Consciousness (Pre-procedure): Awake and Alert Pre-procedure Verification/Time Out Yes - 13:41 Taken: Start Time: 13:41 T Area Debrided (L x W): otal 0.5 (cm) x 0.5 (cm) = 0.25 (cm) Tissue and other material debrided: Non-Viable, Callus, Skin: Epidermis Level: Skin/Epidermis Debridement Description: Selective/Open Wound Instrument: Curette Bleeding: None End Time: 13:46 Procedural Pain: 0 Post Procedural Pain: 0 Response to Treatment: Procedure was tolerated well Level of Consciousness  (Post- Awake and Alert procedure): Post Debridement Measurements of Total Wound Length: (cm) 0.2 Width: (cm) 0.2 Depth: (cm) 0.1 Volume: (cm) 0.003 Character of Wound/Ulcer Post Debridement: Improved Severity of Tissue Post Debridement: Fat layer exposed Post Procedure Diagnosis Same as Pre-procedure Electronic Signature(s) Signed: 11/17/2020 5:29:52 PM By: Worthy Keeler PA-C Signed: 11/17/2020 5:47:10 PM By: Baruch Gouty RN, BSN Entered By: Baruch Gouty on 11/17/2020 13:46:01 -------------------------------------------------------------------------------- HPI Details Patient Name: Date of Service: [name] 11/17/2020 12:45 PM Medical Record Number: [id number] Patient Account Number: [id number] Date of Birth/Sex: Treating RN: 09-18-1960 (60 y.o. [name] Primary Care Provider: PA TEL, Darel Hong Other Clinician: Referring Provider: Treating Provider/Extender: Worthy Keeler PA TEL, Ena Dawley in Treatment: 7 History of Present Illness HPI Description: Admission 09/24/2020 This is a 60 year old woman with type 2 diabetes poorly controlled and polyneuropathy. She has been been dealing with an area on her right first plantar metatarsal head for about 2 months. [facility] has been [facility] using Santyl and callus debridements. 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 She has been using Santyl for 2 months she has had x-rays done in the podiatrist office there was apparently nothing noted although I am not able to review this. She has not been on antibiotics. Looking through [facility] health link she has had wounds on both feet in the past for which she is followed with Dr.Petery at [facility]. Past medical history includes type 2 diabetes with most recent hemoglobin A1c of 12.4, diabetic peripheral neuropathy, hypertension,  hyperlipidemia, lichen simplex chronicus ABI in our clinic was 0.91 on the right. 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  She is not working currently normally works at [employer]. She said she can hold out to the end of the month. The wound measures slightly smaller today we use silver alginate. She did not tolerate a forefoot offloading boot we have her in a surgical shoe with felt offloading 2/11; right first metatarsal head the wound looks clean although there is some depth surrounding thick skin and callus. I did not debride this. 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 She did not tolerate a forefoot off loader because of balance issues 2/14; follow-up total contact cast reapplied other than slippery on her hardwood floors she had no specific concerns. We did not look at the wound today 2/21; right first metatarsal head. She is tolerating the total contact cast and the wound is measuring slightly smaller. We have been using silver collagen 2/28 right first metatarsal head. Again thick buildup of callus. With careful probing the open wound extends under the callus. This required a debridement. We have been using silver collagen I am going to change to [facility] 11/01/2020 upon evaluation today patient appears to be doing well with regard to her wound. She is showing some signs of improvement there is a new skin growth which is great news. There does not appear to be any evidence of active infection which is also great news. No fevers, chills, nausea, vomiting, or diarrhea. With that being said I do feel like that the patient is making good progress overall and I think the cast is beneficial for her here. She does have quite a bit of callus and I work on getting that pared down today I do think the [employer] is doing a good job for  her. 11/10/2020 on evaluation today patient appears to be doing excellent in regard to her foot ulcer. She has been tolerating the dressing changes including the cast without any complication and though she has a little bit of callus buildup around this is nothing like what it was previous. We have been using Hydrofera Blue up to this point. 11/17/2020 on evaluation today patient appears to be doing excellent in regard to her wound. This is actually showing signs of significant improvement which is great news. Fortunately there does not appear to be any evidence of active infection which is also great news. In general I think she is very close to complete resolution. There is a question as to whether or not she may even be healed today. Electronic Signature(s) Signed: 11/17/2020 1:50:00 PM By: Worthy Keeler PA-C Previous Signature: 11/17/2020 1:07:27 PM Version By: Worthy Keeler PA-C Entered By: Worthy Keeler on 11/17/2020 13:50:00 -------------------------------------------------------------------------------- Physical Exam Details Patient Name: Date of Service: [name] 11/17/2020 12:45 PM Medical Record Number: [id number] Patient Account Number: [id number] Date of Birth/Sex: Treating RN: [birth year] (60 y.o. [name] Primary Care Provider: PA TEL, Darel Hong Other Clinician: Referring Provider: Treating Provider/Extender: Worthy Keeler PA TEL, NILA Y Weeks in Treatment: 7 Constitutional Well-nourished and well-hydrated in no acute distress. Respiratory normal breathing without difficulty. Psychiatric this patient is able to make decisions and demonstrates good insight into disease process. Alert and Oriented x 3. pleasant and cooperative. Notes Upon inspection patient's wound bed actually showed signs of some callus over the top of the wound. I was able to debride this way to clear away some of the callus and subsequently she tolerated that without complication post  debridement the wound bed appears to be doing great there is just a small pinpoint opening at this point. Electronic Signature(s) Signed: 11/17/2020 1:51:02 PM By: Worthy Keeler PA-C Entered By: Worthy Keeler on 11/17/2020 13:51:02 -------------------------------------------------------------------------------- Physician Orders Details Patient Name: Date of Service: [name]. 11/17/2020 12:45 PM Medical Record Number: [id number] Patient Account Number: [id number] Date of Birth/Sex: Treating RN: 04-09-1961 (60 y.o. [name] Primary Care Provider: PA TEL, Darel Hong Other Clinician: Referring Provider: Treating Provider/Extender: Worthy Keeler PA TEL, Ena Dawley in Treatment: 7 Verbal / Phone Orders: No Diagnosis Coding ICD-10 Coding Code Description E11.621 Type 2 diabetes mellitus with foot ulcer L97.512 Non-pressure chronic ulcer of other part of right foot with fat layer exposed E11.42 Type 2 diabetes mellitus with diabetic polyneuropathy Follow-up Appointments Return Appointment in 1 week. Bathing/ Shower/ Hygiene May shower with protection but do not get wound dressing(s) wet. Off-Loading Total Contact Cast to Right Lower Extremity Wound Treatment Wound #1 - Metatarsal head first Wound Laterality: Right Cleanser: Wound Cleanser 1 x Per Week/7 Days Discharge Instructions: Cleanse the wound with wound cleanser prior to applying a clean dressing using gauze sponges, not tissue or cotton balls. Peri-Wound Care: Sween Lotion (Moisturizing lotion) 1 x Per Week/7 Days Discharge Instructions: Apply moisturizing lotion to leg and foot Prim Dressing: KerraCel Ag Gelling Fiber Dressing, 2x2 in (silver alginate) 1 x Per Week/7 Days ary Discharge Instructions: Apply silver alginate to wound bed as instructed Secondary Dressing: Woven Gauze Sponges 2x2 in 1 x Per Week/7 Days Discharge Instructions: Apply over primary dressing as directed. Secondary Dressing:  Optifoam Non-Adhesive Dressing, 4x4 in 1 x Per Week/7 Days Discharge Instructions: Apply over primary dressing cut to make foam donut to help offload Secured With: 53M Medipore H Soft Cloth Surgical T 4 x 2 (in/yd) 1 x Per Week/7 Days ape Discharge Instructions: Secure dressing with tape as directed. Electronic Signature(s) Signed: 11/17/2020 5:29:52 PM By: Worthy Keeler PA-C Signed: 11/17/2020 5:47:10 PM By: Baruch Gouty RN, BSN Entered By: Baruch Gouty on 11/17/2020 13:47:25 -------------------------------------------------------------------------------- Problem List Details Patient Name: Date of Service: [name] 11/17/2020 12:45 PM Medical Record Number: [id number] Patient Account Number: [id number] Date of Birth/Sex: Treating RN: [birth year] (60 y.o. [name] Primary Care Provider: PA TEL, Darel Hong Other Clinician: Referring Provider: Treating Provider/Extender: Worthy Keeler PA TEL, Ena Dawley in Treatment: 7 Active Problems ICD-10 Encounter Code Description Active Date MDM Diagnosis E11.621 Type 2 diabetes mellitus with foot ulcer 09/24/2020 No Yes L97.512 Non-pressure chronic ulcer of other part of right foot with fat layer exposed 09/24/2020 No Yes E11.42 Type 2 diabetes mellitus with diabetic polyneuropathy 09/24/2020 No Yes Inactive Problems Resolved Problems Electronic Signature(s) Signed: 11/17/2020 1:07:17 PM By: Worthy Keeler PA-C Entered By: Worthy Keeler on 11/17/2020 13:07:16 -------------------------------------------------------------------------------- Progress Note Details Patient Name: Date of Service: [name] 11/17/2020 12:45 PM Medical Record Number: [id number] Patient Account Number: [id number] Date of Birth/Sex: Treating RN: [birth year] (60 y.o. [name] Primary Care Provider: PA TEL, Darel Hong Other Clinician: Referring Provider: Treating Provider/Extender: Worthy Keeler PA TEL, Ena Dawley in  Treatment: 7 Subjective Chief Complaint Information obtained from Patient 09/24/2020; patient is here for review of a ulcer on her right plantar first metatarsal head History of Present Illness (HPI) Admission 09/24/2020 This is a 60 year old woman with type 2 diabetes poorly controlled and polyneuropathy. She has been been dealing with an  area on her right first plantar metatarsal head for about 2 months. She has been following at [facility] using Santyl and callus debridements. 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 She has been using Santyl for 2 months she has had x-rays done in the podiatrist office there was apparently nothing noted although I am not able to review this. She has not been on antibiotics. Looking through [facility] health link she has had wounds on both feet in the past for which she is followed with Dr.Petery at [facility]. Past medical history includes type 2 diabetes with most recent hemoglobin A1c of 12.4, diabetic peripheral neuropathy, hypertension, hyperlipidemia, lichen simplex chronicus ABI in our clinic was 0.91 on the right. 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 She is not working currently normally works at [employer]. She said she can hold out to the end of the month. The wound measures slightly smaller today we use silver alginate. She did not tolerate a forefoot offloading boot we have her in a surgical shoe with felt offloading 2/11; right first metatarsal head the wound looks clean although there is some depth surrounding thick skin and callus. I did not debride this. 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 She did not tolerate a forefoot off loader because of balance issues 2/14;  follow-up total contact cast reapplied other than slippery on her hardwood floors she had no specific concerns. We did not look at the wound today 2/21; right first metatarsal head. She is tolerating the total contact cast and the wound is measuring slightly smaller. We have been using silver collagen 2/28 right first metatarsal head. Again thick buildup of callus. With careful probing the open wound extends under the callus. This required a debridement. We have been using silver collagen I am going to change to [facility] 11/01/2020 upon evaluation today patient appears to be doing well with regard to her wound. She is showing some signs of improvement there is a new skin growth which is great news. There does not appear to be any evidence of active infection which is also great news. No fevers, chills, nausea, vomiting, or diarrhea. With that being said I do feel like that the patient is making good progress overall and I think the cast is beneficial for her here. She does have quite a bit of callus and I work on getting that pared down today I do think the [employer] is doing a good job for her. 11/10/2020 on evaluation today patient appears to be doing excellent in regard to her foot ulcer. She has been tolerating the dressing changes including the cast without any complication and though she has a little bit of callus buildup around this is nothing like what it was previous. We have been using Hydrofera Blue up to this point. 11/17/2020 on evaluation today patient appears to be doing excellent in regard to her wound. This is actually showing signs of significant improvement which is great news. Fortunately there does not appear to be any evidence of active infection which is also great news. In general I think she is very close to complete resolution. There is a question as to whether or  not she may even be healed today. Objective Constitutional Well-nourished and well-hydrated in no acute  distress. Vitals Time Taken: 12:52 PM, Height: 68 in, Weight: 200 lbs, BMI: 30.4, Temperature: 98.1 F, Pulse: 96 bpm, Respiratory Rate: 18 breaths/min, Blood Pressure: 179/79 mmHg. Respiratory normal breathing without difficulty. Psychiatric this patient is able to make decisions and demonstrates good insight into disease process. Alert and Oriented x 3. pleasant and cooperative. General Notes: Upon inspection patient's wound bed actually showed signs of some callus over the top of the wound. I was able to debride this way to clear away some of the callus and subsequently she tolerated that without complication post debridement the wound bed appears to be doing great there is just a small pinpoint opening at this point. Integumentary (Hair, Skin) Wound #1 status is Open. Original cause of wound was Gradually Appeared. The date acquired was: 06/28/2020. The wound has been in treatment 7 weeks. The wound is located on the Right Metatarsal head first. The wound measures 0.1cm length x 0.1cm width x 0.1cm depth; 0.008cm^2 area and 0.001cm^3 volume. The wound is limited to skin breakdown. There is no tunneling or undermining noted. There is a none present amount of drainage noted. The wound margin is distinct with the outline attached to the wound base. There is no granulation within the wound bed. There is no necrotic tissue within the wound bed. General Notes: mild callous noted. Assessment Active Problems ICD-10 Type 2 diabetes mellitus with foot ulcer Non-pressure chronic ulcer of other part of right foot with fat layer exposed Type 2 diabetes mellitus with diabetic polyneuropathy Procedures Wound #1 Pre-procedure diagnosis of Wound #1 is a Diabetic Wound/Ulcer of the Lower Extremity located on the Right Metatarsal head first .Severity of Tissue Pre Debridement is: Fat layer exposed. There was a Selective/Open Wound Skin/Epidermis Debridement with a total area of 0.25 sq cm performed by  Worthy Keeler, PA. With the following instrument(s): Curette to remove Non-Viable tissue/material. Material removed includes Callus and Skin: Epidermis and. No specimens were taken. A time out was conducted at 13:41, prior to the start of the procedure. There was no bleeding. The procedure was tolerated well with a pain level of 0 throughout and a pain level of 0 following the procedure. Post Debridement Measurements: 0.2cm length x 0.2cm width x 0.1cm depth; 0.003cm^3 volume. Character of Wound/Ulcer Post Debridement is improved. Severity of Tissue Post Debridement is: Fat layer exposed. Post procedure Diagnosis Wound #1: Same as Pre-Procedure Pre-procedure diagnosis of Wound #1 is a Diabetic Wound/Ulcer of the Lower Extremity located on the Right Metatarsal head first . There was a T Contact otal Cast Procedure by Worthy Keeler, PA. Post procedure Diagnosis Wound #1: Same as Pre-Procedure Plan Follow-up Appointments: Return Appointment in 1 week. Bathing/ Shower/ Hygiene: May shower with protection but do not get wound dressing(s) wet. Off-Loading: T Contact Cast to Right Lower Extremity otal WOUND #1: - Metatarsal head first Wound Laterality: Right Cleanser: Wound Cleanser 1 x Per Week/7 Days Discharge Instructions: Cleanse the wound with wound cleanser prior to applying a clean dressing using gauze sponges, not tissue or cotton balls. Peri-Wound Care: Sween Lotion (Moisturizing lotion) 1 x Per Week/7 Days Discharge Instructions: Apply moisturizing lotion to leg and foot Prim Dressing: KerraCel Ag Gelling Fiber Dressing, 2x2 in (silver alginate) 1 x Per Week/7 Days ary Discharge Instructions: Apply silver alginate to wound bed as instructed Secondary Dressing: Woven Gauze Sponges 2x2 in 1 x Per Week/7 Days Discharge Instructions: Apply  over primary dressing as directed. Secondary Dressing: Optifoam Non-Adhesive Dressing, 4x4 in 1 x Per Week/7 Days Discharge Instructions: Apply  over primary dressing cut to make foam donut to help offload Secured With: 34M Medipore H Soft Cloth Surgical T 4 x 2 (in/yd) 1 x Per Week/7 Days ape Discharge Instructions: Secure dressing with tape as directed. 1. Would recommend currently that we going to continue with the wound care measures as before and the patient is in agreement with the plan this includes the use of silver alginate dressing which seems to be doing well for her. 2. Also can recommend that we continue with a total contact cast as I feel like that is also helpful at this time. 3. I am also can recommend the patient continue to monitor for any signs of worsening infection. Obviously she is in the cast of mainly this will be an increase in the pain or something else that causes her to need [place] to provide additional attention if that occurs she will let us know. I did apply the total contact cast today myself as well. We will see patient back for reevaluation in 1 week here in the clinic. If anything worsens or changes patient will contact our office for additional recommendations. Electronic Signature(s) Signed: 11/17/2020 1:51:55 PM By: Worthy Keeler PA-C Entered By: Worthy Keeler on 11/17/2020 13:51:55 -------------------------------------------------------------------------------- Total Contact Cast Details Patient Name: Date of Service: [name] 11/17/2020 12:45 PM Medical Record Number: [id number] Patient Account Number: [id number] Date of Birth/Sex: Treating RN: 03-10-1961 (60 y.o. [name] Primary Care Provider: PA TEL, Darel Hong Other Clinician: Referring Provider: Treating Provider/Extender: Worthy Keeler PA TEL, Ena Dawley in Treatment: 7 T Contact Cast Applied for Wound Assessment: otal Wound #1 Right Metatarsal head first Performed By: Physician Worthy Keeler, PA Post Procedure Diagnosis Same as Pre-procedure Electronic Signature(s) Signed: 11/17/2020 5:29:52 PM By: Worthy Keeler PA-C Signed: 11/17/2020 5:47:10 PM By: Baruch Gouty RN, BSN Entered By: Baruch Gouty on 11/17/2020 13:46:41 -------------------------------------------------------------------------------- SuperBill Details Patient Name: Date of Service: [name], Morton Amy 11/17/2020 Medical Record Number: [id number] Patient Account Number: [id number] Date of Birth/Sex: Treating RN: 10/08/1960 (60 y.o. [name], [name] Primary Care Provider: PA TEL, Darel Hong Other Clinician: Referring Provider: Treating Provider/Extender: Worthy Keeler PA TEL, Ena Dawley in Treatment: 7 Diagnosis Coding ICD-10 Codes Code Description E11.621 Type 2 diabetes mellitus with foot ulcer L97.512 Non-pressure chronic ulcer of other part of right foot with fat layer exposed E11.42 Type 2 diabetes mellitus with diabetic polyneuropathy Facility Procedures CPT4 Code: [id number] Description: [phone] - DEBRIDE WOUND 1ST 20 SQ CM OR < ICD-10 Diagnosis Description L97.512 Non-pressure chronic ulcer of other part of right foot with fat layer exposed Modifier: Quantity: 1 Physician Procedures : CPT4 Code Description Modifier [id number] - WC PHYS DEBR WO ANESTH 20 SQ CM ICD-10 Diagnosis Description L97.512 Non-pressure chronic ulcer of other part of right foot with fat layer exposed Quantity: 1 Electronic Signature(s) Signed: 11/17/2020 1:52:03 PM By: Worthy Keeler PA-C Entered By: Worthy Keeler on 11/17/2020 13:52:02

## 2020-11-18 NOTE — Progress Notes (Signed)
[name], Nicole Golden ([id number]) Visit Report for 11/17/2020 Arrival Information Details Patient Name: Date of Service: Nicole Golden 11/17/2020 12:45 PM Medical Record Number: [id number] Patient Account Number: [id number] Date of Birth/Sex: Treating RN: 11-Aug-1961 (60 y.o. [name] Primary Care Provider: PA TEL, Darel Hong Other Clinician: Referring Provider: Treating Provider/Extender: Worthy Keeler PA TEL, Ena Dawley in Treatment: 7 Visit Information History Since Last Visit Added or deleted any medications: No Patient Arrived: Gilford Rile Any new allergies or adverse reactions: No Arrival Time: 12:52 Had a fall or experienced change in No Accompanied By: self activities of daily living that may affect Transfer Assistance: None risk of falls: Patient Identification Verified: Yes Signs or symptoms of abuse/neglect since last visito No Secondary Verification Process Completed: Yes Hospitalized since last visit: No Patient Requires Transmission-Based Precautions: No Implantable device outside of the clinic excluding No Patient Has Alerts: No cellular tissue based products placed in the center since last visit: Has Dressing in Place as Prescribed: Yes Pain Present Now: No Electronic Signature(s) Signed: 11/18/2020 9:47:55 AM By: Sandre Kitty Entered By: Sandre Kitty on 11/17/2020 12:52:54 -------------------------------------------------------------------------------- Encounter Discharge Information Details Patient Name: Date of Service: Nicole Jester J. 11/17/2020 12:45 PM Medical Record Number: [id number] Patient Account Number: [id number] Date of Birth/Sex: Treating RN: 09-10-1960 (60 y.o. Nicole Golden Primary Care Provider: PA TEL, Darel Hong Other Clinician: Referring Provider: Treating Provider/Extender: Worthy Keeler PA TEL, NILA Y Weeks in Treatment: 7 Encounter Discharge Information Items Post Procedure Vitals Discharge Condition:  Stable Temperature (F): 98.1 Ambulatory Status: Walker Pulse (bpm): 96 Discharge Destination: Home Respiratory Rate (breaths/min): 18 Transportation: Private Auto Blood Pressure (mmHg): 179/79 Accompanied By: self Schedule Follow-up Appointment: Yes Clinical Summary of Care: Electronic Signature(s) Signed: 11/17/2020 6:09:10 PM By: Deon Pilling Entered By: Deon Pilling on 11/17/2020 14:16:23 -------------------------------------------------------------------------------- Lower Extremity Assessment Details Patient Name: Date of Service: Nicole Golden 11/17/2020 12:45 PM Medical Record Number: [id number] Patient Account Number: [id number] Date of Birth/Sex: Treating RN: January 31, 1961 (60 y.o. Nicole Golden Primary Care Provider: PA TEL, Darel Hong Other Clinician: Referring Provider: Treating Provider/Extender: Worthy Keeler PA TEL, NILA Y Weeks in Treatment: 7 Edema Assessment Assessed: [Left: No] [Right: Yes] Edema: [Left: N] [Right: o] Calf Left: Right: Point of Measurement: 34 cm From Medial Instep 33 cm Ankle Left: Right: Point of Measurement: 11 cm From Medial Instep 23 cm Vascular Assessment Pulses: Dorsalis Pedis Palpable: [Right:Yes] Electronic Signature(s) Signed: 11/17/2020 6:09:10 PM By: Deon Pilling Entered By: Deon Pilling on 11/17/2020 13:16:36 -------------------------------------------------------------------------------- [place] Details Patient Name: Date of Service: Nicole Golden, Nicole Burns J. 11/17/2020 12:45 PM Medical Record Number: [id number] Patient Account Number: [id number] Date of Birth/Sex: Treating RN: [birth year] (60 y.o. [name] Primary Care Provider: PA TEL, Darel Hong Other Clinician: Referring Provider: Treating Provider/Extender: Worthy Keeler PA TEL, Ena Dawley in Treatment: [place] reviewed with physician Active Inactive Nutrition Nursing Diagnoses: Impaired glucose control:  actual or potential Potential for alteratiion in Nutrition/Potential for imbalanced nutrition Goals: Patient/caregiver will maintain therapeutic glucose control Date Initiated: 09/24/2020 Target Resolution Date: 11/26/2020 Goal Status: Active Interventions: Assess HgA1c results as ordered upon admission and as needed Assess patient nutrition upon admission and as needed per policy Provide education on elevated blood sugars and impact on wound healing Treatment Activities: Patient referred to Primary Care Physician for further nutritional evaluation : 09/24/2020 Notes: Wound/Skin Impairment Nursing Diagnoses: Impaired tissue integrity Knowledge deficit related to  ulceration/compromised skin integrity Goals: Patient/caregiver will verbalize understanding of skin care regimen Date Initiated: 09/24/2020 Target Resolution Date: 11/26/2020 Goal Status: Active Ulcer/skin breakdown will have a volume reduction of 30% by week 4 Date Initiated: 09/24/2020 Date Inactivated: 10/25/2020 Target Resolution Date: 10/29/2020 Goal Status: Met Ulcer/skin breakdown will have a volume reduction of 50% by week 8 Date Initiated: 10/25/2020 Target Resolution Date: 11/26/2020 Goal Status: Active Interventions: Assess patient/caregiver ability to obtain necessary supplies Assess patient/caregiver ability to perform ulcer/skin care regimen upon admission and as needed Assess ulceration(s) every visit Provide education on ulcer and skin care Treatment Activities: Skin care regimen initiated : 09/24/2020 Topical wound management initiated : 09/24/2020 Notes: Electronic Signature(s) Signed: 11/17/2020 5:47:10 PM By: Baruch Gouty RN, BSN Entered By: Baruch Gouty on 11/17/2020 13:42:58 -------------------------------------------------------------------------------- Pain Assessment Details Patient Name: Date of Service: Nicole Golden. 11/17/2020 12:45 PM Medical Record Number: [id number] Patient Account  Number: [id number] Date of Birth/Sex: Treating RN: 1961-07-18 (60 y.o. [name] Primary Care Provider: PA TEL, Darel Hong Other Clinician: Referring Provider: Treating Provider/Extender: Worthy Keeler PA TEL, NILA Y Weeks in Treatment: 7 Active Problems Location of Pain Severity and Description of Pain Patient Has Paino No Site Locations Pain Management and Medication Current Pain Management: Electronic Signature(s) Signed: 11/17/2020 5:47:10 PM By: Baruch Gouty RN, BSN Signed: 11/18/2020 9:47:55 AM By: Sandre Kitty Entered By: Sandre Kitty on 11/17/2020 12:54:02 -------------------------------------------------------------------------------- Patient/Caregiver Education Details Patient Name: Date of Service: Nicole Golden 3/23/2022andnbsp12:45 PM Medical Record Number: [id number] Patient Account Number: [id number] Date of Birth/Gender: Treating RN: [birth year] (60 y.o. [name] Primary Care Physician: PA TEL, Darel Hong Other Clinician: Referring Physician: Treating Physician/Extender: Worthy Keeler PA TEL, Ena Dawley in Treatment: 7 Education Assessment Education Provided To: Patient Education Topics Provided Elevated Blood Sugar/ Impact on Healing: Methods: Explain/Verbal Responses: Reinforcements needed, State content correctly Offloading: Methods: Explain/Verbal Responses: Reinforcements needed, State content correctly Electronic Signature(s) Signed: 11/17/2020 5:47:10 PM By: Baruch Gouty RN, BSN Entered By: Baruch Gouty on 11/17/2020 13:43:33 -------------------------------------------------------------------------------- Wound Assessment Details Patient Name: Date of Service: Nicole Jester J. 11/17/2020 12:45 PM Medical Record Number: [id number] Patient Account Number: [id number] Date of Birth/Sex: Treating RN: 1960/10/29 (60 y.o. [name] Primary Care Provider: PA TEL, Darel Hong Other Clinician: Referring  Provider: Treating Provider/Extender: Worthy Keeler PA TEL, NILA Y Weeks in Treatment: 7 Wound Status Wound Number: 1 Primary Etiology: Diabetic Wound/Ulcer of the Lower Extremity Wound Location: Right Metatarsal head first Wound Status: Open Wounding Event: Gradually Appeared Comorbid History: Hypertension, Type II Diabetes, Neuropathy Date Acquired: 06/28/2020 Weeks Of Treatment: 7 Clustered Wound: No Photos Wound Measurements Length: (cm) 0.1 Width: (cm) 0.1 Depth: (cm) 0.1 Area: (cm) 0.008 Volume: (cm) 0.001 % Reduction in Area: 97.5% % Reduction in Volume: 99.7% Epithelialization: Large (67-100%) Tunneling: No Undermining: No Wound Description Classification: Grade 2 Wound Margin: Distinct, outline attached Exudate Amount: None Present Foul Odor After Cleansing: No Slough/Fibrino No Wound Bed Granulation Amount: None Present (0%) Exposed Structure Necrotic Amount: None Present (0%) Fascia Exposed: No Fat Layer (Subcutaneous Tissue) Exposed: No Tendon Exposed: No Muscle Exposed: No Joint Exposed: No Bone Exposed: No Limited to Skin Breakdown Assessment Notes mild callous noted. Treatment Notes Wound #1 (Metatarsal head first) Wound Laterality: Right Cleanser Wound Cleanser Discharge Instruction: Cleanse the wound with wound cleanser prior to applying a clean dressing using gauze sponges, not tissue or cotton balls. Peri-Wound Care Sween Lotion (Moisturizing lotion) Discharge Instruction: Apply  moisturizing lotion to leg and foot Topical Primary Dressing KerraCel Ag Gelling Fiber Dressing, 2x2 in (silver alginate) Discharge Instruction: Apply silver alginate to wound bed as instructed Secondary Dressing Woven Gauze Sponges 2x2 in Discharge Instruction: Apply over primary dressing as directed. Optifoam Non-Adhesive Dressing, 4x4 in Discharge Instruction: Apply over primary dressing cut to make foam donut to help offload Secured With 27M Medipore H  Soft Cloth Surgical T 4 x 2 (in/yd) ape Discharge Instruction: Secure dressing with tape as directed. Compression Wrap Compression Stockings Add-Ons Electronic Signature(s) Signed: 11/17/2020 5:47:10 PM By: Baruch Gouty RN, BSN Signed: 11/18/2020 9:47:55 AM By: Sandre Kitty Previous Signature: 11/17/2020 1:49:48 PM Version By: Worthy Keeler PA-C Entered By: Sandre Kitty on 11/17/2020 16:25:58 -------------------------------------------------------------------------------- [place] Details Patient Name: Date of Service: Nicole Golden, Nicole Burns J. 11/17/2020 12:45 PM Medical Record Number: [id number] Patient Account Number: [id number] Date of Birth/Sex: Treating RN: 1961/04/24 (60 y.o. [name] Primary Care Trace Wirick: PA TEL, Darel Hong Other Clinician: Referring Saphyra Hutt: Treating Gentry Seeber/Extender: Worthy Keeler PA TEL, NILA Y Weeks in Treatment: 7 Vital Signs Time Taken: 12:52 Temperature (F): 98.1 Height (in): 68 Pulse (bpm): 96 Weight (lbs): 200 Respiratory Rate (breaths/min): 18 Body Mass Index (BMI): 30.4 Blood Pressure (mmHg): 179/79 Reference Range: 80 - 120 mg / dl Electronic Signature(s) Signed: 11/18/2020 9:47:55 AM By: Sandre Kitty Entered By: Sandre Kitty on 11/17/2020 12:53:48

## 2020-11-24 ENCOUNTER — Encounter (HOSPITAL_BASED_OUTPATIENT_CLINIC_OR_DEPARTMENT_OTHER): Payer: Medicare HMO | Admitting: Physician Assistant

## 2020-11-24 ENCOUNTER — Other Ambulatory Visit: Payer: Self-pay

## 2020-11-24 DIAGNOSIS — E11621 Type 2 diabetes mellitus with foot ulcer: Secondary | ICD-10-CM | POA: Diagnosis not present

## 2020-11-24 NOTE — Progress Notes (Signed)
Nicole, Golden ([id number]) Visit Report for 11/24/2020 Arrival Information Details Patient Name: Date of Service: Nicole Golden 11/24/2020 2:15 PM Medical Record Number: [id number] Patient Account Number: [id number] Date of Birth/Sex: Treating RN: 1961/01/19 (60 y.o. Nicole Golden Primary Care Mkenzie Dotts: PA TEL, Darel Hong Other Clinician: Referring Vallorie Niccoli: Treating Elner Seifert/Extender: Worthy Keeler PA TEL, Ena Dawley in Treatment: 8 Visit Information History Since Last Visit Added or deleted any medications: No Patient Arrived: Gilford Rile Any new allergies or adverse reactions: No Arrival Time: 15:00 Had a fall or experienced change in No Accompanied By: self activities of daily living that may affect Transfer Assistance: None risk of falls: Patient Identification Verified: Yes Signs or symptoms of abuse/neglect since last visito No Secondary Verification Process Completed: Yes Hospitalized since last visit: No Patient Requires Transmission-Based Precautions: No Implantable device outside of the clinic excluding No Patient Has Alerts: No cellular tissue based products placed in the center since last visit: Has Dressing in Place as Prescribed: Yes Has Footwear/Offloading in Place as Prescribed: Yes Right: T Contact Cast otal Pain Present Now: No Electronic Signature(s) Signed: 11/24/2020 5:51:12 PM By: Deon Pilling Entered By: Deon Pilling on 11/24/2020 15:02:29 -------------------------------------------------------------------------------- Clinic Level of Care Assessment Details Patient Name: Date of Service: Nicole Golden 11/24/2020 2:15 PM Medical Record Number: [id number] Patient Account Number: [id number] Date of Birth/Sex: Treating RN: Dec 19, 1960 (60 y.o. Nicole Golden Primary Care Lizett Chowning: PA TEL, Darel Hong Other Clinician: Referring Bali Lyn: Treating Mounir Skipper/Extender: Worthy Keeler PA TEL, NILA Y Weeks in Treatment: 8 Clinic Level of Care  Assessment Items TOOL 4 Quantity Score []  - 0 Use when only an EandM is performed on FOLLOW-UP visit ASSESSMENTS - Nursing Assessment / Reassessment X- 1 10 Reassessment of Co-morbidities (includes updates in patient status) X- 1 5 Reassessment of Adherence to Treatment Plan ASSESSMENTS - Wound and Skin A ssessment / Reassessment X - Simple Wound Assessment / Reassessment - one wound 1 5 []  - 0 Complex Wound Assessment / Reassessment - multiple wounds []  - 0 Dermatologic / Skin Assessment (not related to wound area) ASSESSMENTS - Focused Assessment []  - 0 Circumferential Edema Measurements - multi extremities []  - 0 Nutritional Assessment / Counseling / Intervention X- 1 5 Lower Extremity Assessment (monofilament, tuning fork, pulses) []  - 0 Peripheral Arterial Disease Assessment (using hand held doppler) ASSESSMENTS - Ostomy and/or Continence Assessment and Care []  - 0 Incontinence Assessment and Management []  - 0 Ostomy Care Assessment and Management (repouching, etc.) PROCESS - Coordination of Care X - Simple Patient / Family Education for ongoing care 1 15 []  - 0 Complex (extensive) Patient / Family Education for ongoing care X- 1 10 Staff obtains Programmer, systems, Records, T Results / Process Orders est []  - 0 Staff telephones HHA, Nursing Homes / Clarify orders / etc []  - 0 Routine Transfer to another Facility (non-emergent condition) []  - 0 Routine Hospital Admission (non-emergent condition) []  - 0 New Admissions / Biomedical engineer / Ordering NPWT Apligraf, etc. , []  - 0 Emergency Hospital Admission (emergent condition) X- 1 10 Simple Discharge Coordination []  - 0 Complex (extensive) Discharge Coordination PROCESS - Special Needs []  - 0 Pediatric / Minor Patient Management []  - 0 Isolation Patient Management []  - 0 Hearing / Language / Visual special needs []  - 0 Assessment of Community assistance (transportation, D/C planning, etc.) []  -  0 Additional assistance / Altered mentation []  - 0 Support Surface(s) Assessment (bed, cushion, seat, etc.) INTERVENTIONS - Wound  Cleansing / Measurement X - Simple Wound Cleansing - one wound 1 5 []  - 0 Complex Wound Cleansing - multiple wounds X- 1 5 Wound Imaging (photographs - any number of wounds) []  - 0 Wound Tracing (instead of photographs) []  - 0 Simple Wound Measurement - one wound []  - 0 Complex Wound Measurement - multiple wounds INTERVENTIONS - Wound Dressings []  - 0 Small Wound Dressing one or multiple wounds []  - 0 Medium Wound Dressing one or multiple wounds []  - 0 Large Wound Dressing one or multiple wounds []  - 0 Application of Medications - topical []  - 0 Application of Medications - injection INTERVENTIONS - Miscellaneous []  - 0 External ear exam []  - 0 Specimen Collection (cultures, biopsies, blood, body fluids, etc.) []  - 0 Specimen(s) / Culture(s) sent or taken to Lab for analysis []  - 0 Patient Transfer (multiple staff / Civil Service fast streamer / Similar devices) []  - 0 Simple Staple / Suture removal (25 or less) []  - 0 Complex Staple / Suture removal (26 or more) []  - 0 Hypo / Hyperglycemic Management (close monitor of Blood Glucose) []  - 0 Ankle / Brachial Index (ABI) - do not check if billed separately X- 1 5 Vital Signs Has the patient been seen at the hospital within the last three years: Yes Total Score: 75 Level Of Care: New/Established - Level 2 Electronic Signature(s) Signed: 11/24/2020 5:50:20 PM By: Baruch Gouty RN, BSN Entered By: Baruch Gouty on 11/24/2020 15:55:09 -------------------------------------------------------------------------------- Encounter Discharge Information Details Patient Name: Date of Service: Nicole Golden, Nicole Golden J. 11/24/2020 2:15 PM Medical Record Number: [id number] Patient Account Number: [id number] Date of Birth/Sex: Treating RN: 01/11/1961 (60 y.o. Nicole Golden Primary Care Lenay Lovejoy: PA TEL, Darel Hong Other  Clinician: Referring Fiorela Pelzer: Treating Shiquita Collignon/Extender: Worthy Keeler PA TEL, Ena Dawley in Treatment: 8 Encounter Discharge Information Items Discharge Condition: Stable Ambulatory Status: Walker Discharge Destination: Home Transportation: Private Auto Accompanied By: self Schedule Follow-up Appointment: Yes Clinical Summary of Care: Patient Declined Electronic Signature(s) Signed: 11/24/2020 5:50:20 PM By: Baruch Gouty RN, BSN Entered By: Baruch Gouty on 11/24/2020 15:58:15 -------------------------------------------------------------------------------- Lower Extremity Assessment Details Patient Name: Date of Service: Nicole Golden. 11/24/2020 2:15 PM Medical Record Number: [id number] Patient Account Number: [id number] Date of Birth/Sex: Treating RN: 24-Apr-1961 (60 y.o. Nicole Golden Primary Care Abdullah Rizzi: PA TEL, Darel Hong Other Clinician: Referring Carianna Lague: Treating Zachory Mangual/Extender: Worthy Keeler PA TEL, NILA Y Weeks in Treatment: 8 Edema Assessment Assessed: [Left: No] [Right: Yes] Edema: [Left: N] [Right: o] Calf Left: Right: Point of Measurement: 34 cm From Medial Instep 33 cm Ankle Left: Right: Point of Measurement: 11 cm From Medial Instep 22 cm Vascular Assessment Pulses: Dorsalis Pedis Palpable: [Right:Yes] Electronic Signature(s) Signed: 11/24/2020 5:51:12 PM By: Deon Pilling Entered By: Deon Pilling on 11/24/2020 15:06:27 -------------------------------------------------------------------------------- Multi-Disciplinary Care Plan Details Patient Name: Date of Service: Nicole Golden, Nicole Golden J. 11/24/2020 2:15 PM Medical Record Number: [id number] Patient Account Number: [id number] Date of Birth/Sex: Treating RN: 01-Sep-1960 (60 y.o. Nicole Golden Primary Care Tobin Witucki: PA TEL, Darel Hong Other Clinician: Referring Chlora Mcbain: Treating Savannha Welle/Extender: Worthy Keeler PA TEL, Ena Dawley in Treatment: [place]  reviewed with physician Active Inactive Electronic Signature(s) Signed: 11/24/2020 5:50:20 PM By: Baruch Gouty RN, BSN Entered By: Baruch Gouty on 11/24/2020 15:50:55 -------------------------------------------------------------------------------- Pain Assessment Details Patient Name: Date of Service: Nicole Golden 11/24/2020 2:15 PM Medical Record Number: [id number] Patient Account Number: [id number] Date of Birth/Sex: Treating RN:  June 24, 1961 (60 y.o. Nicole Golden Primary Care Cody Albus: PA TEL, Darel Hong Other Clinician: Referring Peyson Delao: Treating Talayia Hjort/Extender: Worthy Keeler PA TEL, NILA Y Weeks in Treatment: 8 Active Problems Location of Pain Severity and Description of Pain Patient Has Paino No Site Locations Rate the pain. Rate the pain. Current Pain Level: 0 Pain Management and Medication Current Pain Management: Medication: No Cold Application: No Rest: No Massage: No Activity: No T.E.N.S.: No Heat Application: No Leg drop or elevation: No Is the Current Pain Management Adequate: Adequate How does your wound impact your activities of daily livingo Sleep: No Bathing: No Appetite: No Relationship With Others: No Bladder Continence: No Emotions: No Bowel Continence: No Work: No Toileting: No Drive: No Dressing: No Hobbies: No Electronic Signature(s) Signed: 11/24/2020 5:51:12 PM By: Deon Pilling Entered By: Deon Pilling on 11/24/2020 15:04:06 -------------------------------------------------------------------------------- Patient/Caregiver Education Details Patient Name: Date of Service: Nicole Golden 3/30/2022andnbsp2:15 PM Medical Record Number: [id number] Patient Account Number: [id number] Date of Birth/Gender: Treating RN: Dec 13, 1960 (60 y.o. Nicole Golden Primary Care Physician: PA TEL, Darel Hong Other Clinician: Referring Physician: Treating Physician/Extender: Worthy Keeler PA TEL, Ena Dawley in Treatment:  8 Education Assessment Education Provided To: Patient Education Topics Provided Offloading: Methods: Explain/Verbal Responses: Reinforcements needed, State content correctly Wound/Skin Impairment: Methods: Explain/Verbal Responses: Reinforcements needed, State content correctly Electronic Signature(s) Signed: 11/24/2020 5:50:20 PM By: Baruch Gouty RN, BSN Signed: 11/24/2020 5:50:20 PM By: Baruch Gouty RN, BSN Entered By: Baruch Gouty on 11/24/2020 15:51:18 -------------------------------------------------------------------------------- Wound Assessment Details Patient Name: Date of Service: Nicole Golden 11/24/2020 2:15 PM Medical Record Number: [id number] Patient Account Number: [id number] Date of Birth/Sex: Treating RN: 17-Dec-1960 (60 y.o. [name], Meta.Reding Primary Care Charlesa Ehle: PA TEL, Darel Hong Other Clinician: Referring Philena Obey: Treating Alyssia Heese/Extender: Worthy Keeler PA TEL, NILA Y Weeks in Treatment: 8 Wound Status Wound Number: 1 Primary Etiology: Diabetic Wound/Ulcer of the Lower Extremity Wound Location: Right Metatarsal head first Wound Status: Healed - Epithelialized Wounding Event: Gradually Appeared Comorbid History: Hypertension, Type II Diabetes, Neuropathy Date Acquired: 06/28/2020 Weeks Of Treatment: 8 Clustered Wound: No Photos Wound Measurements Length: (cm) Width: (cm) Depth: (cm) Area: (cm) Volume: (cm) 0 % Reduction in Area: 100% 0 % Reduction in Volume: 100% 0 Epithelialization: Large (67-100%) 0 0 Wound Description Classification: Grade 2 Wound Margin: Distinct, outline attached Exudate Amount: None Present Foul Odor After Cleansing: No Slough/Fibrino No Wound Bed Granulation Amount: None Present (0%) Exposed Structure Necrotic Amount: None Present (0%) Fascia Exposed: No Fat Layer (Subcutaneous Tissue) Exposed: No Tendon Exposed: No Muscle Exposed: No Joint Exposed: No Bone Exposed: No Limited to Skin  Breakdown Electronic Signature(s) Signed: 11/24/2020 5:25:01 PM By: Sandre Kitty Signed: 11/24/2020 5:51:12 PM By: Deon Pilling Entered By: Sandre Kitty on 11/24/2020 16:59:26 -------------------------------------------------------------------------------- [place] Details Patient Name: Date of Service: Nicole Golden, Nicole Golden J. 11/24/2020 2:15 PM Medical Record Number: [id number] Patient Account Number: [id number] Date of Birth/Sex: Treating RN: 1961/02/25 (60 y.o. [name], Meta.Reding Primary Care Kodi Guerrera: PA TEL, Darel Hong Other Clinician: Referring Filbert Craze: Treating Rainen Vanrossum/Extender: Worthy Keeler PA TEL, NILA Y Weeks in Treatment: 8 Vital Signs Time Taken: 15:02 Temperature (F): 98.3 Height (in): 68 Pulse (bpm): 98 Weight (lbs): 200 Respiratory Rate (breaths/min): 18 Body Mass Index (BMI): 30.4 Blood Pressure (mmHg): 150/70 Reference Range: 80 - 120 mg / dl Electronic Signature(s) Signed: 11/24/2020 5:51:12 PM By: Deon Pilling Entered By: Deon Pilling on 11/24/2020 15:03:58

## 2020-11-25 NOTE — Progress Notes (Addendum)
ZYNASIA, BURKLOW ([id number]) Visit Report for 11/24/2020 Chief Complaint Document Details Patient Name: Date of Service: Nicole Golden 11/24/2020 2:15 PM Medical Record Number: [id number] Patient Account Number: [id number] Date of Birth/Sex: Treating RN: September 26, 1960 (60 y.o. [name] Primary Care Provider: PA TEL, Darel Hong Other Clinician: Referring Provider: Treating Provider/Extender: Worthy Keeler PA TEL, Ena Dawley in Treatment: 8 Information Obtained from: Patient Chief Complaint 09/24/2020; patient is here for review of a ulcer on her right plantar first metatarsal head Electronic Signature(s) Signed: 11/24/2020 3:35:20 PM By: Worthy Keeler PA-C Entered By: Worthy Keeler on 11/24/2020 15:35:20 -------------------------------------------------------------------------------- HPI Details Patient Name: Date of Service: Nicole Golden. 11/24/2020 2:15 PM Medical Record Number: [id number] Patient Account Number: [id number] Date of Birth/Sex: Treating RN: 11/25/1960 (60 y.o. [name] Primary Care Provider: PA TEL, Darel Hong Other Clinician: Referring Provider: Treating Provider/Extender: Worthy Keeler PA TEL, Ena Dawley in Treatment: 8 History of Present Illness HPI Description: Admission 09/24/2020 This is a 60 year old woman with type 2 diabetes poorly controlled and polyneuropathy. She has been been dealing with an area on her right first plantar metatarsal head for about 2 months. She has been following at [facility] using Santyl and callus debridements. 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 She has been using Santyl for 2 months she has had x-rays done in the podiatrist office there was apparently nothing noted although I am not able to review this. She has not been on antibiotics. Looking through [facility] health link she has had wounds  on both feet in the past for which she is followed with Dr.Petery at [facility]. Past medical history includes type 2 diabetes with most recent hemoglobin A1c of 12.4, diabetic peripheral neuropathy, hypertension, hyperlipidemia, lichen simplex chronicus ABI in our clinic was 0.91 on the right. 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 She is not working currently normally works at [employer]. She said she can hold out to the end of the month. The wound measures slightly smaller today we use silver alginate. She did not tolerate a forefoot offloading boot we have her in a surgical shoe with felt offloading 2/11; right first metatarsal head the wound looks clean although there is some depth surrounding thick skin and callus. I did not debride this. 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 She did not tolerate a forefoot off loader because of balance issues 2/14; follow-up total contact cast reapplied other than slippery on her hardwood floors she had no specific concerns. We did not look at the wound today 2/21; right first metatarsal head. She is tolerating the total contact cast and the wound is measuring slightly smaller. We have been using silver collagen 2/28 right first metatarsal head. Again thick buildup of callus. With careful probing the open wound extends under the callus. This required a debridement. We have been using silver collagen I am going to change to [facility] 11/01/2020 upon evaluation today patient appears to be doing well with regard to her wound. She is showing some signs of improvement there is a new skin growth which is great news. There does not appear  to be any evidence of active infection which is also great news. No fevers, chills, nausea, vomiting, or diarrhea. With that being said I do feel like that the  patient is making good progress overall and I think the cast is beneficial for her here. She does have quite a bit of callus and I work on getting that pared down today I do think the [employer] is doing a good job for her. 11/10/2020 on evaluation today patient appears to be doing excellent in regard to her foot ulcer. She has been tolerating the dressing changes including the cast without any complication and though she has a little bit of callus buildup around this is nothing like what it was previous. We have been using Hydrofera Blue up to this point. 11/17/2020 on evaluation today patient appears to be doing excellent in regard to her wound. This is actually showing signs of significant improvement which is great news. Fortunately there does not appear to be any evidence of active infection which is also great news. In general I think she is very close to complete resolution. There is a question as to whether or not she may even be healed today. 11/24/2020 upon evaluation today patient appears to be doing excellent in regard to her foot ulcer. In fact this is completely healed upon evaluation and inspection today. I do not see any evidence of active infection which is great news and overall I am extremely pleased with where things stand today. No fevers, chills, nausea, vomiting, or diarrhea. Electronic Signature(s) Signed: 11/24/2020 4:11:01 PM By: Worthy Keeler PA-C Entered By: Worthy Keeler on 11/24/2020 16:11:01 -------------------------------------------------------------------------------- Physical Exam Details Patient Name: Date of Service: Nicole Golden 11/24/2020 2:15 PM Medical Record Number: [id number] Patient Account Number: [id number] Date of Birth/Sex: Treating RN: [birth year] (60 y.o. [name] Primary Care Provider: PA TEL, Darel Hong Other Clinician: Referring Provider: Treating Provider/Extender: Worthy Keeler PA TEL, NILA Y Weeks in Treatment:  8 Constitutional Well-nourished and well-hydrated in no acute distress. Respiratory normal breathing without difficulty. Psychiatric this patient is able to make decisions and demonstrates good insight into disease process. Alert and Oriented x 3. pleasant and cooperative. Notes Patient's wound bed showed signs of good granulation epithelization at this point. There does not appear to be any signs of active infection which is great news and overall I am extremely pleased with where things stand currently. I do believe that the patient is in a good spot where everything is nice and tough for her to be discharged and she does have a plan as far as trying to keep this from reopening going forward. Electronic Signature(s) Signed: 11/24/2020 4:11:26 PM By: Worthy Keeler PA-C Entered By: Worthy Keeler on 11/24/2020 16:11:25 -------------------------------------------------------------------------------- Physician Orders Details Patient Name: Date of Service: Nicole Golden 11/24/2020 2:15 PM Medical Record Number: [id number] Patient Account Number: [id number] Date of Birth/Sex: Treating RN: 1961-07-17 (60 y.o. [name] Primary Care Provider: PA TEL, Darel Hong Other Clinician: Referring Provider: Treating Provider/Extender: Worthy Keeler PA TEL, Ena Dawley in Treatment: 8 Verbal / Phone Orders: No Diagnosis Coding ICD-10 Coding Code Description E11.621 Type 2 diabetes mellitus with foot ulcer L97.512 Non-pressure chronic ulcer of other part of right foot with fat layer exposed E11.42 Type 2 diabetes mellitus with diabetic polyneuropathy Discharge From [facility] Services Discharge from [place] Bathing/ Shower/ Hygiene May shower and wash wound with soap and water. Edema Control -  Lymphedema / SCD / Other Moisturize legs daily. - legs and feet nightly Off-Loading Other: - foam callous pad to healed area right foot Electronic Signature(s) Signed: 11/24/2020 5:20:30  PM By: Worthy Keeler PA-C Signed: 11/24/2020 5:50:20 PM By: Baruch Gouty RN, BSN Entered By: Baruch Gouty on 11/24/2020 15:57:35 -------------------------------------------------------------------------------- Problem List Details Patient Name: Date of Service: Nicole Golden. 11/24/2020 2:15 PM Medical Record Number: [id number] Patient Account Number: [id number] Date of Birth/Sex: Treating RN: March 21, 1961 (60 y.o. [name] Primary Care Provider: PA TEL, Darel Hong Other Clinician: Referring Provider: Treating Provider/Extender: Worthy Keeler PA TEL, Ena Dawley in Treatment: 8 Active Problems ICD-10 Encounter Code Description Active Date MDM Diagnosis E11.621 Type 2 diabetes mellitus with foot ulcer 09/24/2020 No Yes L97.512 Non-pressure chronic ulcer of other part of right foot with fat layer exposed 09/24/2020 No Yes E11.42 Type 2 diabetes mellitus with diabetic polyneuropathy 09/24/2020 No Yes Inactive Problems Resolved Problems Electronic Signature(s) Signed: 11/24/2020 3:35:14 PM By: Worthy Keeler PA-C Entered By: Worthy Keeler on 11/24/2020 15:35:13 -------------------------------------------------------------------------------- Progress Note Details Patient Name: Date of Service: Nicole Golden. 11/24/2020 2:15 PM Medical Record Number: [id number] Patient Account Number: [id number] Date of Birth/Sex: Treating RN: [birth year] (60 y.o. [name] Primary Care Provider: PA TEL, Darel Hong Other Clinician: Referring Provider: Treating Provider/Extender: Worthy Keeler PA TEL, Ena Dawley in Treatment: 8 Subjective Chief Complaint Information obtained from Patient 09/24/2020; patient is here for review of a ulcer on her right plantar first metatarsal head History of Present Illness (HPI) Admission 09/24/2020 This is a 60 year old woman with type 2 diabetes poorly controlled and polyneuropathy. She has been been dealing with an area on her  right first plantar metatarsal head for about 2 months. She has been following at [facility] using Santyl and callus debridements. 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 She has been using Santyl for 2 months she has had x-rays done in the podiatrist office there was apparently nothing noted although I am not able to review this. She has not been on antibiotics. Looking through [facility] health link she has had wounds on both feet in the past for which she is followed with Dr.Petery at [facility]. Past medical history includes type 2 diabetes with most recent hemoglobin A1c of 12.4, diabetic peripheral neuropathy, hypertension, hyperlipidemia, lichen simplex chronicus ABI in our clinic was 0.91 on the right. 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 She is not working currently normally works at [employer]. She said she can hold out to the end of the month. The wound measures slightly smaller today we use silver alginate. She did not tolerate a forefoot offloading boot we have her in a surgical shoe with felt offloading 2/11; right first metatarsal head the wound looks clean although there is some depth surrounding thick skin and callus. I did not debride this. 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 She did not tolerate a forefoot off loader because of balance issues 2/14; follow-up total contact cast reapplied other than slippery on her hardwood floors she had no specific concerns. We did not look at the wound today 2/21;  right first metatarsal head. She is tolerating the total contact cast and the wound is measuring slightly smaller. We have been using silver collagen 2/28 right first metatarsal head. Again thick buildup of  callus. With careful probing the open wound extends under the callus. This required a debridement. We have been using silver collagen I am going to change to [facility] 11/01/2020 upon evaluation today patient appears to be doing well with regard to her wound. She is showing some signs of improvement there is a new skin growth which is great news. There does not appear to be any evidence of active infection which is also great news. No fevers, chills, nausea, vomiting, or diarrhea. With that being said I do feel like that the patient is making good progress overall and I think the cast is beneficial for her here. She does have quite a bit of callus and I work on getting that pared down today I do think the [employer] is doing a good job for her. 11/10/2020 on evaluation today patient appears to be doing excellent in regard to her foot ulcer. She has been tolerating the dressing changes including the cast without any complication and though she has a little bit of callus buildup around this is nothing like what it was previous. We have been using Hydrofera Blue up to this point. 11/17/2020 on evaluation today patient appears to be doing excellent in regard to her wound. This is actually showing signs of significant improvement which is great news. Fortunately there does not appear to be any evidence of active infection which is also great news. In general I think she is very close to complete resolution. There is a question as to whether or not she may even be healed today. 11/24/2020 upon evaluation today patient appears to be doing excellent in regard to her foot ulcer. In fact this is completely healed upon evaluation and inspection today. I do not see any evidence of active infection which is great news and overall I am extremely pleased with where things stand today. No fevers, chills, nausea, vomiting, or diarrhea. Objective Constitutional Well-nourished and well-hydrated in no acute  distress. Vitals Time Taken: 3:02 PM, Height: 68 in, Weight: 200 lbs, BMI: 30.4, Temperature: 98.3 F, Pulse: 98 bpm, Respiratory Rate: 18 breaths/min, Blood Pressure: 150/70 mmHg. Respiratory normal breathing without difficulty. Psychiatric this patient is able to make decisions and demonstrates good insight into disease process. Alert and Oriented x 3. pleasant and cooperative. General Notes: Patient's wound bed showed signs of good granulation epithelization at this point. There does not appear to be any signs of active infection which is great news and overall I am extremely pleased with where things stand currently. I do believe that the patient is in a good spot where everything is nice and tough for her to be discharged and she does have a plan as far as trying to keep this from reopening going forward. Integumentary (Hair, Skin) Wound #1 status is Healed - Epithelialized. Original cause of wound was Gradually Appeared. The date acquired was: 06/28/2020. The wound has been in treatment 8 weeks. The wound is located on the Right Metatarsal head first. The wound measures 0cm length x 0cm width x 0cm depth; 0cm^2 area and 0cm^3 volume. Assessment Active Problems ICD-10 Type 2 diabetes mellitus with foot ulcer Non-pressure chronic ulcer of other part of right foot with fat layer exposed Type 2 diabetes mellitus with diabetic polyneuropathy Plan Discharge From [facility] Services: Discharge from [place] Bathing/ Shower/ Hygiene: May shower and wash wound with soap and water. Edema Control - Lymphedema / SCD / Other: Moisturize legs daily. - legs and feet nightly Off-Loading: Other: - foam callous pad to healed area right foot 1. Would recommend that we continue with the wound care measures as before with regard to offloading she knows to use the corn/callus pads over-the- counter to try to help keep pressure off the bottom of her foot. 2. I am going to recommend that she go to  continue with using her shoe inserts which she did get from a podiatrist as well. 3. I would suggest as well she monitor her foot daily in order to make sure nothing is opening or threatening to open. We will see patient back for reevaluation in 1 week here in the clinic. If anything worsens or changes patient will contact our office for additional recommendations. Electronic Signature(s) Signed: 11/24/2020 4:11:57 PM By: Worthy Keeler PA-C Entered By: Worthy Keeler on 11/24/2020 16:11:57 -------------------------------------------------------------------------------- SuperBill Details Patient Name: Date of Service: Nicole Golden 11/24/2020 Medical Record Number: [id number] Patient Account Number: [id number] Date of Birth/Sex: Treating RN: 12/12/1960 (60 y.o. [name], [name] Primary Care Provider: PA TEL, Darel Hong Other Clinician: Referring Provider: Treating Provider/Extender: Worthy Keeler PA TEL, Ena Dawley in Treatment: 8 Diagnosis Coding ICD-10 Codes Code Description E11.621 Type 2 diabetes mellitus with foot ulcer L97.512 Non-pressure chronic ulcer of other part of right foot with fat layer exposed E11.42 Type 2 diabetes mellitus with diabetic polyneuropathy Facility Procedures CPT4 Code: [id number] Description: [phone] - WOUND CARE VISIT-LEV 2 EST PT Modifier: Quantity: 1 Physician Procedures : CPT4 Code Description Modifier [id number] - WC PHYS LEVEL 3 - EST PT ICD-10 Diagnosis Description E11.621 Type 2 diabetes mellitus with foot ulcer L97.512 Non-pressure chronic ulcer of other part of right foot with fat layer exposed E11.42 Type 2  diabetes mellitus with diabetic polyneuropathy Quantity: 1 Electronic Signature(s) Signed: 11/24/2020 4:12:09 PM By: Worthy Keeler PA-C Entered By: Worthy Keeler on 11/24/2020 16:12:09

## 2021-01-25 ENCOUNTER — Encounter (HOSPITAL_BASED_OUTPATIENT_CLINIC_OR_DEPARTMENT_OTHER): Payer: Medicare HMO | Attending: Internal Medicine | Admitting: Internal Medicine

## 2021-01-25 ENCOUNTER — Other Ambulatory Visit: Payer: Self-pay

## 2021-01-25 DIAGNOSIS — Z8249 Family history of ischemic heart disease and other diseases of the circulatory system: Secondary | ICD-10-CM | POA: Insufficient documentation

## 2021-01-25 DIAGNOSIS — Z87891 Personal history of nicotine dependence: Secondary | ICD-10-CM | POA: Insufficient documentation

## 2021-01-25 DIAGNOSIS — E11621 Type 2 diabetes mellitus with foot ulcer: Secondary | ICD-10-CM | POA: Diagnosis present

## 2021-01-25 DIAGNOSIS — Z833 Family history of diabetes mellitus: Secondary | ICD-10-CM | POA: Insufficient documentation

## 2021-01-25 DIAGNOSIS — E1151 Type 2 diabetes mellitus with diabetic peripheral angiopathy without gangrene: Secondary | ICD-10-CM | POA: Insufficient documentation

## 2021-01-25 DIAGNOSIS — E1165 Type 2 diabetes mellitus with hyperglycemia: Secondary | ICD-10-CM | POA: Diagnosis not present

## 2021-01-25 DIAGNOSIS — E1140 Type 2 diabetes mellitus with diabetic neuropathy, unspecified: Secondary | ICD-10-CM | POA: Diagnosis not present

## 2021-01-25 DIAGNOSIS — I10 Essential (primary) hypertension: Secondary | ICD-10-CM | POA: Insufficient documentation

## 2021-01-25 NOTE — Progress Notes (Addendum)
Nicole Golden, Nicole Golden ([id number]) Visit Report for 01/25/2021 Allergy List Details Patient Name: Date of Service: Nicole Golden 01/25/2021 2:45 PM Medical Record Number: [id number] Patient Account Number: [id number] Date of Birth/Sex: Treating RN: [birth year] (60 y.o. Nicole Golden Primary Care Karna Abed: PA TEL, Darel Hong Other Clinician: Referring Eleah Lahaie: Treating Hisae Decoursey/Extender: Kalman Shan PA TEL, NILA Y Weeks in Treatment: 0 Allergies Active Allergies No Known Drug Allergies Allergy Notes Electronic Signature(s) Signed: 01/25/2021 5:34:16 PM By: Deon Pilling Entered By: Deon Pilling on 01/25/2021 15:06:17 -------------------------------------------------------------------------------- Arrival Information Details Patient Name: Date of Service: Nicole Golden, Nicole Burns J. 01/25/2021 2:45 PM Medical Record Number: [id number] Patient Account Number: [id number] Date of Birth/Sex: Treating RN: Nov 20, 1960 (60 y.o. [name], Meta.Reding Primary Care Serina Nichter: PA TEL, Darel Hong Other Clinician: Referring Modest Draeger: Treating Kensley Valladares/Extender: Kalman Shan PA TEL, Ena Dawley in Treatment: 0 Visit Information Patient Arrived: Ambulatory Arrival Time: 15:02 Accompanied By: self Transfer Assistance: None Patient Identification Verified: Yes Secondary Verification Process Completed: Yes Patient Requires Transmission-Based Precautions: No Patient Has Alerts: Yes Patient Alerts: 08/2020 ABI R 0.91 History Since Last Visit Added or deleted any medications: No Any new allergies or adverse reactions: No Had a fall or experienced change in activities of daily living that may affect risk of falls: No Signs or symptoms of abuse/neglect since last visito No Hospitalized since last visit: No Implantable device outside of the clinic excluding cellular tissue based products placed in the center since last visit: No Electronic Signature(s) Signed: 01/25/2021 5:34:16 PM By: Deon Pilling Entered  By: Deon Pilling on 01/25/2021 15:11:19 -------------------------------------------------------------------------------- Clinic Level of Care Assessment Details Patient Name: Date of Service: Nicole Golden 01/25/2021 2:45 PM Medical Record Number: [id number] Patient Account Number: [id number] Date of Birth/Sex: Treating RN: [birth year] (60 y.o. Nicole Golden Primary Care Giacomo Valone: PA TEL, Darel Hong Other Clinician: Referring Korynne Dols: Treating Daric Koren/Extender: Kalman Shan PA TEL, NILA Y Weeks in Treatment: 0 Clinic Level of Care Assessment Items TOOL 1 Quantity Score X- 1 0 Use when EandM and Procedure is performed on INITIAL visit ASSESSMENTS - Nursing Assessment / Reassessment X- 1 20 General Physical Exam (combine w/ comprehensive assessment (listed just below) when performed on new pt. evals) X- 1 25 Comprehensive Assessment (HX, ROS, Risk Assessments, Wounds Hx, etc.) ASSESSMENTS - Wound and Skin Assessment / Reassessment X- 1 10 Dermatologic / Skin Assessment (not related to wound area) ASSESSMENTS - Ostomy and/or Continence Assessment and Care _0  - 0 Incontinence Assessment and Management _1  - 0 Ostomy Care Assessment and Management (repouching, etc.) PROCESS - Coordination of Care X - Simple Patient / Family Education for ongoing care 1 15 _2  - 0 Complex (extensive) Patient / Family Education for ongoing care X- 1 10 Staff obtains Programmer, systems, Records, T Results / Process Orders est _3  - 0 Staff telephones HHA, Nursing Homes / Clarify orders / etc _4  - 0 Routine Transfer to another Facility (non-emergent condition) _5  - 0 Routine Hospital Admission (non-emergent condition) X- 1 15 New Admissions / Biomedical engineer / Ordering NPWT Apligraf, etc. , _6  - 0 Emergency Hospital Admission (emergent condition) PROCESS - Special Needs _7  - 0 Pediatric / Minor Patient Management _8  - 0 Isolation Patient Management _9  - 0 Hearing / Language / Visual  special needs _10  - 0 Assessment of Community assistance (transportation, D/C planning, etc.) _11  - 0 Additional assistance / Altered mentation _12  - 0 Support Surface(s) Assessment (bed, cushion, seat, etc.) INTERVENTIONS - Miscellaneous _13  - 0 External  ear exam _0  - 0 Patient Transfer (multiple staff / Civil Service fast streamer / Similar devices) _1  - 0 Simple Staple / Suture removal (25 or less) _2  - 0 Complex Staple / Suture removal (26 or more) _3  - 0 Hypo/Hyperglycemic Management (do not check if billed separately) _4  - 0 Ankle / Brachial Index (ABI) - do not check if billed separately Has the patient been seen at the hospital within the last three years: Yes Total Score: 95 Level Of Care: New/Established - Level 3 Electronic Signature(s) Signed: 01/25/2021 5:34:16 PM By: Deon Pilling Entered By: Deon Pilling on 01/25/2021 17:29:37 -------------------------------------------------------------------------------- Encounter Discharge Information Details Patient Name: Date of Service: Nicole Golden, Nicole Burns J. 01/25/2021 2:45 PM Medical Record Number: [id number] Patient Account Number: [id number] Date of Birth/Sex: Treating RN: [birth year] (60 y.o. Nicole Golden Primary Care Ewing Fandino: PA TEL, Chriss Driver Y Other Clinician: Referring Jermarion Poffenberger: Treating Zanobia Griebel/Extender: Kalman Shan PA TEL, NILA Y Weeks in Treatment: 0 Encounter Discharge Information Items Post Procedure Vitals Discharge Condition: Stable Temperature (F): 98.4 Ambulatory Status: Ambulatory Pulse (bpm): 74 Discharge Destination: Home Respiratory Rate (breaths/min): 20 Transportation: Private Auto Blood Pressure (mmHg): 191/84 Accompanied By: self Schedule Follow-up Appointment: Yes Clinical Summary of Care: Notes Patient to drive in regular shoe and wear surgical shoe or another open toe shoe to ensure no rubbing to right foot 5th toe. Patient in agreement. Electronic Signature(s) Signed: 01/25/2021 5:34:16 PM By:  Deon Pilling Entered By: Deon Pilling on 01/25/2021 17:31:49 -------------------------------------------------------------------------------- Lower Extremity Assessment Details Patient Name: Date of Service: Nicole Golden 01/25/2021 2:45 PM Medical Record Number: [id number] Patient Account Number: [id number] Date of Birth/Sex: Treating RN: Jun 23, 1961 (60 y.o. Nicole Golden Primary Care Kethan Papadopoulos: PA TEL, Darel Hong Other Clinician: Referring Deklan Minar: Treating Shacara Cozine/Extender: Kalman Shan PA TEL, NILA Y Weeks in Treatment: 0 Edema Assessment Assessed: [Left: No] [Right: Yes] Edema: [Left: N] [Right: o] Calf Left: Right: Point of Measurement: 30 cm From Medial Instep 33 cm Ankle Left: Right: Point of Measurement: 11 cm From Medial Instep 22 cm Knee To Floor Left: Right: From Medial Instep 42 cm Vascular Assessment Pulses: Dorsalis Pedis Palpable: [Right:Yes] Posterior Tibial Palpable: [Right:Yes] Electronic Signature(s) Signed: 01/25/2021 5:34:16 PM By: Deon Pilling Entered By: Deon Pilling on 01/25/2021 15:10:56 -------------------------------------------------------------------------------- Multi Wound Chart Details Patient Name: Date of Service: Nicole Golden, Nicole Burns J. 01/25/2021 2:45 PM Medical Record Number: [id number] Patient Account Number: [id number] Date of Birth/Sex: Treating RN: Jan 21, 1961 (60 y.o. Nicole Golden Primary Care Imonie Tuch: PA TEL, Darel Hong Other Clinician: Referring Efstathios Sawin: Treating Sury Wentworth/Extender: Kalman Shan PA TEL, NILA Y Weeks in Treatment: 0 Vital Signs Height(in): 68 Pulse(bpm): 74 Weight(lbs): 200 Blood Pressure(mmHg): 191/94 Body Mass Index(BMI): 30 Temperature(F): 98.4 Respiratory Rate(breaths/min): 20 Photos: [N/A:N/A] Right T Fifth oe N/A N/A Wound Location: Blister N/A N/A Wounding Event: Diabetic Wound/Ulcer of the Lower N/A N/A Primary Etiology: Extremity Hypertension, Type II Diabetes, N/A  N/A Comorbid History: Neuropathy 01/11/2021 N/A N/A Date Acquired: 0 N/A N/A Weeks of Treatment: Open N/A N/A Wound Status: 0.9x0.8x0.4 N/A N/A Measurements L x W x D (cm) 0.565 N/A N/A A (cm) : rea 0.226 N/A N/A Volume (cm) : 0.00% N/A N/A % Reduction in A rea: 0.00% N/A N/A % Reduction in Volume: 6 Starting Position 1 (o'clock): 12 Ending Position 1 (o'clock): 0.5 Maximum Distance 1 (cm): Yes N/A N/A Undermining: Grade 2 N/A N/A Classification: Medium N/A N/A Exudate A mount: Serosanguineous N/A N/A Exudate Type: red, brown N/A N/A Exudate Color: Distinct, outline attached  N/A N/A Wound Margin: Large (67-100%) N/A N/A Granulation A mount: Red, Pink N/A N/A Granulation Quality: Small (1-33%) N/A N/A Necrotic A mount: Fat Layer (Subcutaneous Tissue): Yes N/A N/A Exposed Structures: Fascia: No Tendon: No Muscle: No Joint: No Bone: No None N/A N/A Epithelialization: Debridement - Excisional N/A N/A Debridement: Pre-procedure Verification/Time Out 15:40 N/A N/A Taken: Lidocaine 4% Topical Solution N/A N/A Pain Control: Callus, Subcutaneous, Slough N/A N/A Tissue Debrided: Skin/Subcutaneous Tissue N/A N/A Level: 1 N/A N/A Debridement A (sq cm): rea Curette N/A N/A Instrument: Minimum N/A N/A Bleeding: Pressure N/A N/A Hemostasis Achieved: 0 N/A N/A Procedural Pain: 0 N/A N/A Post Procedural Pain: Procedure was tolerated well N/A N/A Debridement Treatment Response: 0.9x0.8x0.4 N/A N/A Post Debridement Measurements L x W x D (cm) 0.226 N/A N/A Post Debridement Volume: (cm) callous noted to periwound. N/A N/A Assessment Notes: Debridement N/A N/A Procedures Performed: Treatment Notes Wound #2 (Toe Fifth) Wound Laterality: Right Cleanser Soap and Water Discharge Instruction: May shower and wash wound with dial antibacterial soap and water prior to dressing change. Peri-Wound Care Topical Primary Dressing KerraCel Ag Gelling  Fiber Dressing, 2x2 in (silver alginate) Discharge Instruction: Apply silver alginate ***pack lightly*** into wound bed as instructed Secondary Dressing Woven Gauze Sponges 2x2 in Discharge Instruction: Apply over primary dressing as directed. Secured With 60M [place] [facility] T ape, 2x2 (in/yd) Discharge Instruction: Secure dressing with tape as directed. Compression Wrap Compression Stockings Add-Ons Electronic Signature(s) Signed: 01/26/2021 11:33:30 AM By: Kalman Shan DO Signed: 01/26/2021 5:52:00 PM By: Deon Pilling Previous Signature: 01/25/2021 5:34:16 PM Version By: Deon Pilling Entered By: Kalman Shan on 01/26/2021 10:09:45 -------------------------------------------------------------------------------- Multi-Disciplinary Care Plan Details Patient Name: Date of Service: Nicole Golden, Nicole Burns J. 01/25/2021 2:45 PM Medical Record Number: [id number] Patient Account Number: [id number] Date of Birth/Sex: Treating RN: Jul 28, 1961 (60 y.o. Nicole Golden Primary Care Virgilia Quigg: PA TEL, Darel Hong Other Clinician: Referring Jaquia Benedicto: Treating Najeeb Uptain/Extender: Kalman Shan PA TEL, NILA Y Weeks in Treatment: 0 Active Inactive Nutrition Nursing Diagnoses: Impaired glucose control: actual or potential Goals: Patient/caregiver agrees to and verbalizes understanding of need to obtain nutritional consultation Date Initiated: 01/25/2021 Target Resolution Date: 01/28/2021 Goal Status: Active Interventions: Assess HgA1c results as ordered upon admission and as needed Provide education on elevated blood sugars and impact on wound healing Provide education on nutrition Treatment Activities: Obtain HgA1c : 01/25/2021 Patient referred to Primary Care Physician for further nutritional evaluation : 01/25/2021 Notes: Pain, Acute or Chronic Nursing Diagnoses: Potential alteration in comfort, pain Goals: Patient will verbalize adequate pain control and receive pain control  interventions during procedures as needed Date Initiated: 01/25/2021 Target Resolution Date: 01/28/2021 Goal Status: Active Patient/caregiver will verbalize comfort level met Date Initiated: 01/25/2021 Target Resolution Date: 01/28/2021 Goal Status: Active Interventions: Encourage patient to take pain medications as prescribed Provide education on pain management Reposition patient for comfort Treatment Activities: Administer pain control measures as ordered : 01/25/2021 Notes: Wound/Skin Impairment Nursing Diagnoses: Knowledge deficit related to ulceration/compromised skin integrity Goals: Patient/caregiver will verbalize understanding of skin care regimen Date Initiated: 01/25/2021 Target Resolution Date: 02/24/2021 Goal Status: Active Interventions: Assess patient/caregiver ability to obtain necessary supplies Assess patient/caregiver ability to perform ulcer/skin care regimen upon admission and as needed Provide education on ulcer and skin care Treatment Activities: Skin care regimen initiated : 01/25/2021 Topical wound management initiated : 01/25/2021 Notes: Electronic Signature(s) Signed: 01/25/2021 5:34:16 PM By: Deon Pilling Entered By: Deon Pilling on 01/25/2021 15:17:45 -------------------------------------------------------------------------------- Pain Assessment Details Patient Name:  Date of Service: Nicole Golden 01/25/2021 2:45 PM Medical Record Number: [id number] Patient Account Number: [id number] Date of Birth/Sex: Treating RN: 1961/07/22 (60 y.o. Nicole Golden Primary Care Harbour Nordmeyer: PA TEL, Darel Hong Other Clinician: Referring Jermario Kalmar: Treating Yidel Teuscher/Extender: Kalman Shan PA TEL, NILA Y Weeks in Treatment: 0 Active Problems Location of Pain Severity and Description of Pain Patient Has Paino No Site Locations Rate the pain. Current Pain Level: 0 Pain Management and Medication Current Pain Management: Medication: No Cold Application:  No Rest: No Massage: No Activity: No T.E.N.S.: No Heat Application: No Leg drop or elevation: No Is the Current Pain Management Adequate: Inadequate How does your wound impact your activities of daily livingo Sleep: No Bathing: No Appetite: No Relationship With Others: No Bladder Continence: No Emotions: No Bowel Continence: No Work: No Toileting: No Drive: No Dressing: No Hobbies: No Electronic Signature(s) Signed: 01/25/2021 5:34:16 PM By: Deon Pilling Entered By: Deon Pilling on 01/25/2021 15:09:54 -------------------------------------------------------------------------------- Patient/Caregiver Education Details Patient Name: Date of Service: Nicole Golden 5/31/2022andnbsp2:45 PM Medical Record Number: [id number] Patient Account Number: [id number] Date of Birth/Gender: Treating RN: Feb 06, 1961 (60 y.o. [name], Meta.Reding Primary Care Physician: PA TEL, Darel Hong Other Clinician: Referring Physician: Treating Physician/Extender: Kalman Shan PA TEL, Ena Dawley in Treatment: 0 Education Assessment Education Provided To: Patient Education Topics Provided Elevated Blood Sugar/ Impact on Healing: Handouts: Elevated Blood Sugars: How Do They Affect Wound Healing Methods: Explain/Verbal Responses: Reinforcements needed Electronic Signature(s) Signed: 01/25/2021 5:34:16 PM By: Deon Pilling Entered By: Deon Pilling on 01/25/2021 15:17:53 -------------------------------------------------------------------------------- Wound Assessment Details Patient Name: Date of Service: Nicole Golden. 01/25/2021 2:45 PM Medical Record Number: [id number] Patient Account Number: [id number] Date of Birth/Sex: Treating RN: 1961-01-18 (60 y.o. [name], Meta.Reding Primary Care Mae Cianci: PA TEL, Darel Hong Other Clinician: Referring Saddie Sandeen: Treating Jerrilynn Mikowski/Extender: Kalman Shan PA TEL, NILA Y Weeks in Treatment: 0 Wound Status Wound Number: 2 Primary Etiology:  Diabetic Wound/Ulcer of the Lower Extremity Wound Location: Right T Fifth oe Wound Status: Open Wounding Event: Blister Comorbid History: Hypertension, Type II Diabetes, Neuropathy Date Acquired: 01/11/2021 Weeks Of Treatment: 0 Clustered Wound: No Photos Wound Measurements Length: (cm) 0.9 Width: (cm) 0.8 Depth: (cm) 0.4 Area: (cm) 0.565 Volume: (cm) 0.226 % Reduction in Area: 0% % Reduction in Volume: 0% Epithelialization: None Tunneling: No Undermining: Yes Starting Position (o'clock): 6 Ending Position (o'clock): 12 Maximum Distance: (cm) 0.5 Wound Description Classification: Grade 2 Wound Margin: Distinct, outline attached Exudate Amount: Medium Exudate Type: Serosanguineous Exudate Color: red, brown Foul Odor After Cleansing: No Slough/Fibrino Yes Wound Bed Granulation Amount: Large (67-100%) Exposed Structure Granulation Quality: Red, Pink Fascia Exposed: No Necrotic Amount: Small (1-33%) Fat Layer (Subcutaneous Tissue) Exposed: Yes Necrotic Quality: Adherent Slough Tendon Exposed: No Muscle Exposed: No Joint Exposed: No Bone Exposed: No Assessment Notes callous noted to periwound. Treatment Notes Wound #2 (Toe Fifth) Wound Laterality: Right Cleanser Soap and Water Discharge Instruction: May shower and wash wound with dial antibacterial soap and water prior to dressing change. Peri-Wound Care Topical Primary Dressing KerraCel Ag Gelling Fiber Dressing, 2x2 in (silver alginate) Discharge Instruction: Apply silver alginate ***pack lightly*** into wound bed as instructed Secondary Dressing Woven Gauze Sponges 2x2 in Discharge Instruction: Apply over primary dressing as directed. Secured With 25M [place] [facility] T ape, 2x2 (in/yd) Discharge Instruction: Secure dressing with tape as directed. Compression Wrap Compression Stockings Add-Ons Electronic Signature(s) Signed: 01/25/2021 5:24:49 PM By: Sandre Kitty Signed: 01/25/2021  5:34:16 PM By: Deon Pilling Entered By: Sandre Kitty on 01/25/2021 16:51:15 -------------------------------------------------------------------------------- Vitals Details Patient Name: Date of Service: [name] 01/25/2021 2:45 PM Medical Record Number: [id number] Patient Account Number: [id number] Date of Birth/Sex: Treating RN: 1960-11-30 (60 y.o. [name], Meta.Reding Primary Care Yu Peggs: PA TEL, Darel Hong Other Clinician: Referring Matteus Mcnelly: Treating Antionio Negron/Extender: Kalman Shan PA TEL, NILA Y Weeks in Treatment: 0 Vital Signs Time Taken: 15:05 Temperature (F): 98.4 Height (in): 68 Pulse (bpm): 74 Source: Stated Respiratory Rate (breaths/min): 20 Weight (lbs): 200 Blood Pressure (mmHg): 191/94 Source: Stated Reference Range: 80 - 120 mg / dl Body Mass Index (BMI): 30.4 Electronic Signature(s) Signed: 01/25/2021 5:34:16 PM By: Deon Pilling Entered By: Deon Pilling on 01/25/2021 15:06:02

## 2021-01-25 NOTE — Progress Notes (Signed)
VYLA, PINT ([id number]) Visit Report for 01/25/2021 Abuse/Suicide Risk Screen Details Patient Name: Date of Service: Nicole Golden 01/25/2021 2:45 PM Medical Record Number: [id number] Patient Account Number: [id number] Date of Birth/Sex: Treating RN: 1961-01-17 (60 y.o. [name] Primary Care Kreig Parson: PA TEL, Darel Hong Other Clinician: Referring Arrielle Mcginn: Treating Ashlynne Shetterly/Extender: Kalman Shan PA TEL, NILA Y Weeks in Treatment: 0 Abuse/Suicide Risk Screen Items Answer ABUSE RISK SCREEN: Has anyone close to you tried to hurt or harm you recentlyo No Do you feel uncomfortable with anyone in your familyo No Has anyone forced you do things that you didnt want to doo No Electronic Signature(s) Signed: 01/25/2021 5:34:16 PM By: Deon Pilling Entered By: Deon Pilling on 01/25/2021 15:08:09 -------------------------------------------------------------------------------- Activities of Daily Living Details Patient Name: Date of Service: Nicole Golden 01/25/2021 2:45 PM Medical Record Number: [id number] Patient Account Number: [id number] Date of Birth/Sex: Treating RN: [birth year] (60 y.o. [name] Primary Care Ronique Simerly: PA TEL, Darel Hong Other Clinician: Referring Chessica Audia: Treating Cervando Durnin/Extender: Kalman Shan PA TEL, NILA Y Weeks in Treatment: 0 Activities of Daily Living Items Answer Activities of Daily Living (Please select one for each item) Drive Automobile Completely Able T Medications ake Completely Able Use T elephone Completely Able Care for Appearance Completely Able Use T oilet Completely Able Bath / Shower Completely Able Dress Self Completely Able Feed Self Completely Able Walk Completely Able Get In / Out Bed Completely Able Housework Completely Able Prepare Meals Completely Able Handle Money Completely Able Shop for Self Completely Able Electronic Signature(s) Signed: 01/25/2021 5:34:16 PM By: Deon Pilling Entered By:  Deon Pilling on 01/25/2021 15:08:25 -------------------------------------------------------------------------------- Education Screening Details Patient Name: Date of Service: Nicole Golden. 01/25/2021 2:45 PM Medical Record Number: [id number] Patient Account Number: [id number] Date of Birth/Sex: Treating RN: 1961-05-21 (60 y.o. [name] Primary Care Benaiah Behan: PA TEL, Darel Hong Other Clinician: Referring Loden Laurent: Treating Momodou Consiglio/Extender: Kalman Shan PA TEL, Ena Dawley in Treatment: 0 Primary Learner Assessed: Patient Learning Preferences/Education Level/Primary Language Learning Preference: Explanation, Demonstration, Printed Material Highest Education Level: High School Preferred Language: English Cognitive Barrier Language Barrier: No Translator Needed: No Memory Deficit: No Emotional Barrier: No Cultural/Religious Beliefs Affecting Medical Care: No Physical Barrier Impaired Vision: No Impaired Hearing: No Knowledge/Comprehension Knowledge Level: High Comprehension Level: High Ability to understand written instructions: High Ability to understand verbal instructions: High Motivation Anxiety Level: Calm Cooperation: Cooperative Education Importance: Acknowledges Need Interest in Health Problems: Asks Questions Perception: Coherent Willingness to Engage in Self-Management High Activities: Readiness to Engage in Self-Management High Activities: Electronic Signature(s) Signed: 01/25/2021 5:34:16 PM By: Deon Pilling Entered By: Deon Pilling on 01/25/2021 15:08:55 -------------------------------------------------------------------------------- Fall Risk Assessment Details Patient Name: Date of Service: Nicole Jester J. 01/25/2021 2:45 PM Medical Record Number: [id number] Patient Account Number: [id number] Date of Birth/Sex: Treating RN: Jan 23, 1961 (61 y.o. [name], Meta.Reding Primary Care Elleni Mozingo: PA TEL, Darel Hong Other Clinician: Referring  Antia Rahal: Treating Kena Limon/Extender: Kalman Shan PA TEL, Ena Dawley in Treatment: 0 Fall Risk Assessment Items Have you had 2 or more falls in the last 12 monthso 0 No Have you had any fall that resulted in injury in the last 12 monthso 0 No FALLS RISK SCREEN History of falling - immediate or within 3 months 0 No Secondary diagnosis (Do you have 2 or more medical diagnoseso) 0 No Ambulatory aid None/bed rest/wheelchair/nurse 0 Yes Crutches/cane/walker 0 No Furniture 0 No Intravenous therapy Access/Saline/Heparin Lock 0 No Gait/Transferring  Normal/ bed rest/ wheelchair 0 Yes Weak (short steps with or without shuffle, stooped but able to lift head while walking, may seek 0 No support from furniture) Impaired (short steps with shuffle, may have difficulty arising from chair, head down, impaired 0 No balance) Mental Status Oriented to own ability 0 Yes Electronic Signature(s) Signed: 01/25/2021 5:34:16 PM By: Deon Pilling Entered By: Deon Pilling on 01/25/2021 15:09:05 -------------------------------------------------------------------------------- Foot Assessment Details Patient Name: Date of Service: Nicole Golden. 01/25/2021 2:45 PM Medical Record Number: [id number] Patient Account Number: [id number] Date of Birth/Sex: Treating RN: 30-Dec-1960 (60 y.o. [name] Primary Care Kaison Mcparland: PA TEL, Darel Hong Other Clinician: Referring Kellsey Sansone: Treating Kline Bulthuis/Extender: Kalman Shan PA TEL, NILA Y Weeks in Treatment: 0 Foot Assessment Items Site Locations + = Sensation present, - = Sensation absent, C = Callus, U = Ulcer R = Redness, W = Warmth, M = Maceration, PU = Pre-ulcerative lesion F = Fissure, S = Swelling, D = Dryness Assessment Right: Left: Other Deformity: No No Prior Foot Ulcer: No No Prior Amputation: No No Charcot Joint: No No Ambulatory Status: Ambulatory Without Help Gait: Steady Electronic Signature(s) Signed: 01/25/2021 5:34:16 PM  By: Deon Pilling Entered By: Deon Pilling on 01/25/2021 15:12:12 -------------------------------------------------------------------------------- Nutrition Risk Screening Details Patient Name: Date of Service: Nicole Golden 01/25/2021 2:45 PM Medical Record Number: [id number] Patient Account Number: [id number] Date of Birth/Sex: Treating RN: 08-29-1960 (60 y.o. [name], Meta.Reding Primary Care Jaquay Morneault: PA TEL, Darel Hong Other Clinician: Referring Veronnica Hennings: Treating Lyfe Reihl/Extender: Kalman Shan PA TEL, NILA Y Weeks in Treatment: 0 Height (in): 68 Weight (lbs): 200 Body Mass Index (BMI): 30.4 Nutrition Risk Screening Items Score Screening NUTRITION RISK SCREEN: I have an illness or condition that made me change the kind and/or amount of food I eat 2 Yes I eat fewer than two meals per day 0 No I eat few fruits and vegetables, or milk products 0 No I have three or more drinks of beer, liquor or wine almost every day 0 No I have tooth or mouth problems that make it hard for me to eat 0 No I don't always have enough money to buy the food I need 0 No I eat alone most of the time 0 No I take three or more different prescribed or over-the-counter drugs a day 1 Yes Without wanting to, I have lost or gained 10 pounds in the last six months 0 No I am not always physically able to shop, cook and/or feed myself 0 No Nutrition Protocols Good Risk Protocol Provide education on elevated blood Moderate Risk Protocol 0 sugars and impact on wound healing, as applicable High Risk Proctocol Risk Level: Moderate Risk Score: 3 Electronic Signature(s) Signed: 01/25/2021 5:34:16 PM By: Deon Pilling Entered By: Deon Pilling on 01/25/2021 15:09:27

## 2021-01-26 NOTE — Progress Notes (Signed)
Nicole Golden ([id number]) Visit Report for 01/25/2021 Chief Complaint Document Details Patient Name: Date of Service: Nicole Golden 01/25/2021 2:45 PM Medical Record Number: [id number] Patient Account Number: [id number] Date of Birth/Sex: Treating RN: [birth year] (60 y.o. Nicole Golden Primary Care Provider: PA TEL, Darel Golden Other Clinician: Referring Provider: Treating Provider/Extender: Nicole Shan Nicole TEL, Nicole Golden in Treatment: 0 Information Obtained from: Patient Chief Complaint right fifth lateral toe wound Electronic Signature(s) Signed: 01/26/2021 11:33:30 AM By: Nicole Golden Entered By: Nicole Shan on 01/26/2021 11:18:58 -------------------------------------------------------------------------------- Debridement Details Patient Name: Date of Service: Nicole Jester J. 01/25/2021 2:45 PM Medical Record Number: [id number] Patient Account Number: [id number] Date of Birth/Sex: Treating RN: [birth year]Golden (60 y.o. Nicole Golden Primary Care Provider: PA TEL, Darel Golden Other Clinician: Referring Provider: Treating Provider/Extender: Nicole Shan Nicole TEL, Nicole Golden in Treatment: 0 Debridement Performed for Assessment: Wound #2 Right T Fifth oe Performed By: Physician Nicole Shan, Golden Debridement Type: Debridement Severity of Tissue Pre Debridement: Fat layer exposed Level of Consciousness (Pre-procedure): Awake and Alert Pre-procedure Verification/Time Out Yes - 15:40 Taken: Start Time: 15:41 Pain Control: Lidocaine 4% T opical Solution T Area Debrided (L x W): otal 1 (cm) x 1 (cm) = 1 (cm) Tissue and other material debrided: Viable, Non-Viable, Callus, Slough, Subcutaneous, Skin: Dermis , Skin: Epidermis, Fibrin/Exudate, Slough Level: Skin/Subcutaneous Tissue Debridement Description: Excisional Instrument: Curette Bleeding: Minimum Hemostasis Achieved: Pressure End Time: 15:47 Procedural Pain: 0 Post Procedural Pain: 0 Response  to Treatment: Procedure was tolerated well Level of Consciousness (Post- Awake and Alert procedure): Post Debridement Measurements of Total Wound Length: (cm) 0.9 Width: (cm) 0.8 Depth: (cm) 0.4 Volume: (cm) 0.226 Character of Wound/Ulcer Post Debridement: Improved Severity of Tissue Post Debridement: Fat layer exposed Post Procedure Diagnosis Same as Pre-procedure Electronic Signature(s) Signed: 01/25/2021 5:34:16 PM By: Nicole Golden Signed: 01/26/2021 11:33:30 AM By: Nicole Golden Entered By: Nicole Golden on 01/25/2021 15:47:27 -------------------------------------------------------------------------------- HPI Details Patient Name: Date of Service: Nicole Jester J. 01/25/2021 2:45 PM Medical Record Number: [id number] Patient Account Number: [id number] Date of Birth/Sex: Treating RN: Nicole Golden (60 y.o. Nicole Golden Primary Care Provider: PA TEL, Darel Golden Other Clinician: Referring Provider: Treating Provider/Extender: Nicole Shan Nicole TEL, Nicole Golden in Treatment: 0 History of Present Illness HPI Description: Admission 5/31 Nicole Golden is a 60 year old female with a past medical history of uncontrolled type 2 diabetes and previous diabetic foot ulcer that presents today for a 2-week history of wound to her fifth right lateral toe. She states that her feet are narrow and that they move around in her shoes and she thinks that it rubbed up against causing a wound. She has been using silver alginate to the area. She denies signs of infection. 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 Electronic Signature(s) Signed: 01/26/2021 11:33:30 AM By: Nicole Golden Entered By: Nicole Shan on 01/26/2021 11:28:12 -------------------------------------------------------------------------------- Physical Exam Details Patient Name: Date of Service: Nicole Golden 01/25/2021 2:45 PM Medical Record Number:  [id number] Patient Account Number: [id number] Date of Birth/Sex: Treating RN: [birth year]Golden (60 y.o. Nicole Golden, Nicole Golden Primary Care Provider: PA TEL, Darel Golden Other Clinician: Referring Provider: Treating Provider/Extender: Nicole Shan Nicole TEL, Nicole Golden in Treatment: 0 Constitutional respirations regular, non-labored and within target range for patient.. Cardiovascular 2+ dorsalis pedis/posterior tibialis pulses. Psychiatric pleasant and cooperative. Notes Right foot: Fifth lateral toe with  open wound and nonviable tissue. Post debridement there is granulation tissue present. No acute signs of infection. No significant edema on exam Electronic Signature(s) Signed: 01/26/2021 11:33:30 AM By: Nicole Golden Entered By: Nicole Shan on 01/26/2021 11:29:02 -------------------------------------------------------------------------------- Physician Orders Details Patient Name: Date of Service: Nicole Golden. 01/25/2021 2:45 PM Medical Record Number: [id number] Patient Account Number: [id number] Date of Birth/Sex: Treating RN: [birth year] (60 y.o. Nicole Golden Primary Care Provider: PA TEL, Darel Golden Other Clinician: Referring Provider: Treating Provider/Extender: Nicole Shan Nicole TEL, Nicole Golden in Treatment: 0 Verbal / Phone Orders: No Diagnosis Coding ICD-10 Coding Code Description E11.621 Type 2 diabetes mellitus with foot ulcer E11.65 Type 2 diabetes mellitus with hyperglycemia E11.40 Type 2 diabetes mellitus with diabetic neuropathy, unspecified Follow-up Appointments ppointment in 1 week. - Nicole Golden Return A Bathing/ Shower/ Hygiene May shower and wash wound with soap and water. - with dressing changes only. Edema Control - Lymphedema / SCD / Other Elevate legs to the level of the heart or above for 30 minutes daily and/or when sitting, a frequency of: - throughout the day. Avoid standing for long periods of time. Moisturize legs daily. - both  legs nightly. Off-Loading Open toe surgical Golden to: - wear while walking and standing. Wound Treatment Wound #2 - T Fifth oe Wound Laterality: Right Cleanser: Soap and Water 1 x Per Day/15 Days Discharge Instructions: May shower and wash wound with dial antibacterial soap and water prior to dressing change. Prim Dressing: KerraCel Ag Gelling Fiber Dressing, 2x2 in (silver alginate) (DME) (Generic) 1 x Per Day/15 Days ary Discharge Instructions: Apply silver alginate ***pack lightly*** into wound bed as instructed Secondary Dressing: Woven Gauze Sponges 2x2 in (DME) (Generic) 1 x Per Day/15 Days Discharge Instructions: Apply over primary dressing as directed. Secured With: 5M Medipore H Soft Cloth Surgical Tape, 2x2 (in/yd) (DME) (Generic) 1 x Per Day/15 Days Discharge Instructions: Secure dressing with tape as directed. Patient Medications llergies: No Known Drug Allergies A Notifications Medication Indication Start End lidocaine DOSE topical 4 % gel - gel topical applied only for debridement by MD. Electronic Signature(s) Signed: 01/26/2021 11:33:30 AM By: Nicole Golden Previous Signature: 01/25/2021 5:34:16 PM Version By: Nicole Golden Entered By: Nicole Shan on 01/26/2021 11:29:37 Prescription 01/25/2021 -------------------------------------------------------------------------------- [place]. Nicole Golden Patient Name: Provider: 1961/04/18 [id number] Date of Birth: NPI#: [id number] Sex: DEA #: [phone] Phone #: License #: [place] Patient Address: [address], [place] [address] [place], [place] [address] [phone] Allergies No Known Drug Allergies Medication Medication: Route: Strength: Form: lidocaine 4 % topical gel topical 4% gel Class: TOPICAL LOCAL ANESTHETICS Dose: Frequency / Time: Indication: gel topical applied only for debridement by  MD. Number of Refills: Number of Units: 0 Generic Substitution: Start Date: End Date: One Time Use: Substitution Permitted No Note to Pharmacy: Hand Signature: Date(s): Electronic Signature(s) Signed: 01/26/2021 11:33:30 AM By: Nicole Golden Previous Signature: 01/25/2021 5:34:16 PM Version By: Nicole Golden Entered By: Nicole Shan on 01/26/2021 11:29:41 -------------------------------------------------------------------------------- Problem List Details Patient Name: Date of Service: Nicole Jester J. 01/25/2021 2:45 PM Medical Record Number: [id number] Patient Account Number: [id number] Date of Birth/Sex: Treating RN: [birth year]Golden (60 y.o. Nicole Golden Primary Care Provider: PA TEL, Darel Golden Other Clinician: Referring Provider: Treating Provider/Extender: Nicole Shan Nicole TEL, Nicole Golden in Treatment: 0 Active Problems ICD-10 Encounter Code Description  Active Date MDM Diagnosis E11.621 Type 2 diabetes mellitus with foot ulcer 01/25/2021 No Yes E11.65 Type 2 diabetes mellitus with hyperglycemia 01/26/2021 No Yes E11.40 Type 2 diabetes mellitus with diabetic neuropathy, unspecified 01/26/2021 No Yes Inactive Problems Resolved Problems Electronic Signature(s) Signed: 01/26/2021 11:33:30 AM By: Nicole Golden Previous Signature: 01/25/2021 5:34:16 PM Version By: Nicole Golden Entered By: Nicole Shan on 01/26/2021 10:09:30 -------------------------------------------------------------------------------- Progress Note Details Patient Name: Date of Service: Nicole Jester J. 01/25/2021 2:45 PM Medical Record Number: [id number] Patient Account Number: [id number] Date of Birth/Sex: Treating RN: [birth year] (60 y.o. Nicole Golden, Nicole Golden Primary Care Provider: PA TEL, Darel Golden Other Clinician: Referring Provider: Treating Provider/Extender: Nicole Shan Nicole TEL, Nicole Golden in Treatment: 0 Subjective Chief Complaint Information obtained from  Patient right fifth lateral toe wound History of Present Illness (HPI) Admission 5/31 [name] is a 60 year old female with a past medical history of uncontrolled type 2 diabetes and previous diabetic foot ulcer that presents today for a 2-week history of wound to her fifth right lateral toe. She states that her feet are narrow and that they move around in her shoes and she thinks that it rubbed up against causing a wound. She has been using silver alginate to the area. She denies signs of infection. 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 Patient History Information obtained from Patient. Allergies No Known Drug Allergies Family History Diabetes - Father,Siblings,Child, Hypertension - Mother, No family history of Cancer, Heart Disease, Hereditary Spherocytosis, Kidney Disease, Lung Disease, Seizures, Stroke, Thyroid Problems, Tuberculosis. Social History Former smoker - quit 30 years ago, Marital Status - Widowed, Alcohol Use - Never, Drug Use - No History, Caffeine Use - Moderate - coffee, tea. Medical History Eyes Denies history of Cataracts, Glaucoma, Optic Neuritis Ear/Nose/Mouth/Throat Denies history of Chronic sinus problems/congestion, Middle ear problems Hematologic/Lymphatic Denies history of Anemia, Hemophilia, Human Immunodeficiency Virus, Lymphedema, Sickle Cell Disease Respiratory Denies history of Aspiration, Asthma, Chronic Obstructive Pulmonary Disease (COPD), Pneumothorax, Sleep Apnea, Tuberculosis Cardiovascular Patient has history of Hypertension Denies history of Angina, Arrhythmia, Congestive Heart Failure, Coronary Artery Disease, Deep Vein Thrombosis, Hypotension, Myocardial Infarction, Peripheral Arterial Disease, Peripheral Venous Disease, Phlebitis Gastrointestinal Denies history of Cirrhosis , Colitis, Crohnoos, Hepatitis A, Hepatitis B, Hepatitis C Endocrine Patient has history of Type II Diabetes Denies  history of Type I Diabetes Genitourinary Denies history of End Stage Renal Disease Immunological Denies history of Lupus Erythematosus, Raynaudoos, Scleroderma Integumentary (Skin) Denies history of History of Burn Musculoskeletal Denies history of Gout, Rheumatoid Arthritis, Osteoarthritis, Osteomyelitis Neurologic Patient has history of Neuropathy Denies history of Dementia, Quadriplegia, Seizure Disorder Oncologic Denies history of Received Chemotherapy, Received Radiation Psychiatric Denies history of Anorexia/bulimia, Confinement Anxiety Patient is treated with Insulin. Blood sugar is tested. Medical A Surgical History Notes nd Eyes Hypertensive Retinopathy Cardiovascular Hyperlipdemia Review of Systems (ROS) Constitutional Symptoms (General Health) Denies complaints or symptoms of Fatigue, Fever, Chills, Marked Weight Change. Eyes Denies complaints or symptoms of Dry Eyes, Vision Changes, Glasses / Contacts. Ear/Nose/Mouth/Throat Denies complaints or symptoms of Chronic sinus problems or rhinitis. Respiratory Denies complaints or symptoms of Chronic or frequent coughs, Shortness of Breath. Cardiovascular Denies complaints or symptoms of Chest pain. Gastrointestinal Denies complaints or symptoms of Frequent diarrhea, Nausea, Vomiting. Endocrine Denies complaints or symptoms of Heat/cold intolerance. Genitourinary Denies complaints or symptoms of Frequent urination. Integumentary (Skin) Complains or has symptoms of Wounds - right 5th toe. Musculoskeletal Denies complaints or symptoms  of Muscle Pain, Muscle Weakness. Neurologic Denies complaints or symptoms of Numbness/parasthesias. Psychiatric Denies complaints or symptoms of Claustrophobia, Suicidal. Objective Constitutional respirations regular, non-labored and within target range for patient.. Vitals Time Taken: 3:05 PM, Height: 68 in, Source: Stated, Weight: 200 lbs, Source: Stated, BMI: 30.4, Temperature:  98.4 F, Pulse: 74 bpm, Respiratory Rate: 20 breaths/min, Blood Pressure: 191/94 mmHg. Cardiovascular 2+ dorsalis pedis/posterior tibialis pulses. Psychiatric pleasant and cooperative. General Notes: Right foot: Fifth lateral toe with open wound and nonviable tissue. Post debridement there is granulation tissue present. No acute signs of infection. No significant edema on exam Integumentary (Hair, Skin) Wound #2 status is Open. Original cause of wound was Blister. The date acquired was: 01/11/2021. The wound is located on the Right T Fifth. The wound oe measures 0.9cm length x 0.8cm width x 0.4cm depth; 0.565cm^2 area and 0.226cm^3 volume. There is Fat Layer (Subcutaneous Tissue) exposed. There is no tunneling noted, however, there is undermining starting at 6:00 and ending at 12:00 with a maximum distance of 0.5cm. There is a medium amount of serosanguineous drainage noted. The wound margin is distinct with the outline attached to the wound base. There is large (67-100%) red, pink granulation within the wound bed. There is a small (1-33%) amount of necrotic tissue within the wound bed including Adherent Slough. General Notes: callous noted to periwound. Assessment Active Problems ICD-10 Type 2 diabetes mellitus with foot ulcer Type 2 diabetes mellitus with hyperglycemia Type 2 diabetes mellitus with diabetic neuropathy, unspecified Patient presents with a 2-week history of open wound to her right fifth toe. It appears that this rubbed against the inside of her Golden causing skin breakdown. I debrided some of the nonviable tissue and recommended using silver alginate to this area daily. I also recommended a surgical Golden which we gave her in office today. We discussed gust aggressive offloading to this area. No acute signs of infection. We will see her back in a week Procedures Wound #2 Pre-procedure diagnosis of Wound #2 is a Diabetic Wound/Ulcer of the Lower Extremity located on the Right  T Fifth .Severity of Tissue Pre Debridement is: oe Fat layer exposed. There was a Excisional Skin/Subcutaneous Tissue Debridement with a total area of 1 sq cm performed by Nicole Shan, Golden. With the following instrument(s): Curette to remove Viable and Non-Viable tissue/material. Material removed includes Callus, Subcutaneous Tissue, Slough, Skin: Dermis, Skin: Epidermis, and Fibrin/Exudate after achieving pain control using Lidocaine 4% T opical Solution. A time out was conducted at 15:40, prior to the start of the procedure. A Minimum amount of bleeding was controlled with Pressure. The procedure was tolerated well with a pain level of 0 throughout and a pain level of 0 following the procedure. Post Debridement Measurements: 0.9cm length x 0.8cm width x 0.4cm depth; 0.226cm^3 volume. Character of Wound/Ulcer Post Debridement is improved. Severity of Tissue Post Debridement is: Fat layer exposed. Post procedure Diagnosis Wound #2: Same as Pre-Procedure Plan Follow-up Appointments: Return Appointment in 1 week. - Dr. Heber  Bathing/ Shower/ Hygiene: May shower and wash wound with soap and water. - with dressing changes only. Edema Control - Lymphedema / SCD / Other: Elevate legs to the level of the heart or above for 30 minutes daily and/or when sitting, a frequency of: - throughout the day. Avoid standing for long periods of time. Moisturize legs daily. - both legs nightly. Off-Loading: Open toe surgical Golden to: - wear while walking and standing. The following medication(s) was prescribed: lidocaine topical 4 % gel gel  topical applied only for debridement by MD. was prescribed at facility WOUND #2: - T Fifth Wound Laterality: Right oe Cleanser: Soap and Water 1 x Per Day/15 Days Discharge Instructions: May shower and wash wound with dial antibacterial soap and water prior to dressing change. Prim Dressing: KerraCel Ag Gelling Fiber Dressing, 2x2 in (silver alginate) (DME) (Generic) 1  x Per Day/15 Days ary Discharge Instructions: Apply silver alginate ***pack lightly*** into wound bed as instructed Secondary Dressing: Woven Gauze Sponges 2x2 in (DME) (Generic) 1 x Per Day/15 Days Discharge Instructions: Apply over primary dressing as directed. Secured With: 22M Medipore H Soft Cloth Surgical T ape, 2x2 (in/yd) (DME) (Generic) 1 x Per Day/15 Days Discharge Instructions: Secure dressing with tape as directed. 1. In office sharp debridement 2. Silver alginate daily 3. Follow-up in 1 week Electronic Signature(s) Signed: 01/26/2021 11:33:30 AM By: Nicole Golden Entered By: Nicole Shan on 01/26/2021 11:32:32 -------------------------------------------------------------------------------- HxROS Details Patient Name: Date of Service: Nicole Jester J. 01/25/2021 2:45 PM Medical Record Number: [id number] Patient Account Number: [id number] Date of Birth/Sex: Treating RN: [birth year]Golden (60 y.o. Nicole Golden Primary Care Provider: PA TEL, Darel Golden Other Clinician: Referring Provider: Treating Provider/Extender: Nicole Shan Nicole TEL, Nicole Golden in Treatment: 0 Information Obtained From Patient Constitutional Symptoms (General Health) Complaints and Symptoms: Negative for: Fatigue; Fever; Chills; Marked Weight Change Eyes Complaints and Symptoms: Negative for: Dry Eyes; Vision Changes; Glasses / Contacts Medical History: Negative for: Cataracts; Glaucoma; Optic Neuritis Past Medical History Notes: Hypertensive Retinopathy Ear/Nose/Mouth/Throat Complaints and Symptoms: Negative for: Chronic sinus problems or rhinitis Medical History: Negative for: Chronic sinus problems/congestion; Middle ear problems Respiratory Complaints and Symptoms: Negative for: Chronic or frequent coughs; Shortness of Breath Medical History: Negative for: Aspiration; Asthma; Chronic Obstructive Pulmonary Disease (COPD); Pneumothorax; Sleep Apnea;  Tuberculosis Cardiovascular Complaints and Symptoms: Negative for: Chest pain Medical History: Positive for: Hypertension Negative for: Angina; Arrhythmia; Congestive Heart Failure; Coronary Artery Disease; Deep Vein Thrombosis; Hypotension; Myocardial Infarction; Peripheral Arterial Disease; Peripheral Venous Disease; Phlebitis Past Medical History Notes: Hyperlipdemia Gastrointestinal Complaints and Symptoms: Negative for: Frequent diarrhea; Nausea; Vomiting Medical History: Negative for: Cirrhosis ; Colitis; Crohns; Hepatitis A; Hepatitis B; Hepatitis C Endocrine Complaints and Symptoms: Negative for: Heat/cold intolerance Medical History: Positive for: Type II Diabetes Negative for: Type I Diabetes Time with diabetes: 1984 Treated with: Insulin Blood sugar tested every day: Yes Tested : 3 times a day Genitourinary Complaints and Symptoms: Negative for: Frequent urination Medical History: Negative for: End Stage Renal Disease Integumentary (Skin) Complaints and Symptoms: Positive for: Wounds - right 5th toe Medical History: Negative for: History of Burn Musculoskeletal Complaints and Symptoms: Negative for: Muscle Pain; Muscle Weakness Medical History: Negative for: Gout; Rheumatoid Arthritis; Osteoarthritis; Osteomyelitis Neurologic Complaints and Symptoms: Negative for: Numbness/parasthesias Medical History: Positive for: Neuropathy Negative for: Dementia; Quadriplegia; Seizure Disorder Psychiatric Complaints and Symptoms: Negative for: Claustrophobia; Suicidal Medical History: Negative for: Anorexia/bulimia; Confinement Anxiety Hematologic/Lymphatic Medical History: Negative for: Anemia; Hemophilia; Human Immunodeficiency Virus; Lymphedema; Sickle Cell Disease Immunological Medical History: Negative for: Lupus Erythematosus; Raynauds; Scleroderma Oncologic Medical History: Negative for: Received Chemotherapy; Received  Radiation Immunizations Pneumococcal Vaccine: Received Pneumococcal Vaccination: Yes Implantable Devices None Family and Social History Cancer: No; Diabetes: Yes - Father,Siblings,Child; Heart Disease: No; Hereditary Spherocytosis: No; Hypertension: Yes - Mother; Kidney Disease: No; Lung Disease: No; Seizures: No; Stroke: No; Thyroid Problems: No; Tuberculosis: No; Former smoker - quit 30 years ago; Marital Status - Widowed; Alcohol Use: Never; Drug Use: No History; Caffeine  Use: Moderate - coffee, tea; Financial Concerns: No; Food, Clothing or Shelter Needs: No; Support System Lacking: No; Transportation Concerns: No Electronic Signature(s) Signed: 01/25/2021 5:34:16 PM By: Nicole Golden Signed: 01/26/2021 11:33:30 AM By: Nicole Golden Entered By: Nicole Golden on 01/25/2021 15:08:02 -------------------------------------------------------------------------------- SuperBill Details Patient Name: Date of Service: Nicole Golden 01/25/2021 Medical Record Number: [id number] Patient Account Number: [id number] Date of Birth/Sex: Treating RN: [birth year] Golden (60 y.o. Nicole Golden Primary Care Provider: PA TEL, Darel Golden Other Clinician: Referring Provider: Treating Provider/Extender: Nicole Shan Nicole TEL, Nicole Golden in Treatment: 0 Diagnosis Coding ICD-10 Codes Code Description E11.621 Type 2 diabetes mellitus with foot ulcer E11.65 Type 2 diabetes mellitus with hyperglycemia E11.40 Type 2 diabetes mellitus with diabetic neuropathy, unspecified Facility Procedures CPT4 Code: [id number] Description: 81275 - WOUND CARE VISIT-LEV 3 EST PT Modifier: Quantity: 1 CPT4 Code: [id number] Description: 44967 - DEB SUBQ TISSUE 20 SQ CM/< ICD-10 Diagnosis Description E11.621 Type 2 diabetes mellitus with foot ulcer Modifier: Quantity: 1 Physician Procedures : CPT4 Code Description Modifier [id number] - WC PHYS LEVEL 3 - EST PT ICD-10 Diagnosis Description E11.621 Type 2 diabetes  mellitus with foot ulcer E11.65 Type 2 diabetes mellitus with hyperglycemia E11.40 Type 2 diabetes mellitus with diabetic  neuropathy, unspecified Quantity: 1 : [id number] 11042 - WC PHYS SUBQ TISS 20 SQ CM ICD-10 Diagnosis Description E11.621 Type 2 diabetes mellitus with foot ulcer Quantity: 1 Electronic Signature(s) Signed: 01/26/2021 11:33:30 AM By: Nicole Golden Previous Signature: 01/25/2021 5:34:16 PM Version By: Nicole Golden Entered By: Nicole Shan on 01/26/2021 11:33:04

## 2021-01-31 ENCOUNTER — Other Ambulatory Visit: Payer: Self-pay

## 2021-01-31 ENCOUNTER — Encounter (HOSPITAL_BASED_OUTPATIENT_CLINIC_OR_DEPARTMENT_OTHER): Payer: Medicare HMO | Attending: Internal Medicine | Admitting: Internal Medicine

## 2021-01-31 DIAGNOSIS — E1140 Type 2 diabetes mellitus with diabetic neuropathy, unspecified: Secondary | ICD-10-CM | POA: Insufficient documentation

## 2021-01-31 DIAGNOSIS — E1165 Type 2 diabetes mellitus with hyperglycemia: Secondary | ICD-10-CM | POA: Diagnosis not present

## 2021-01-31 DIAGNOSIS — Z87891 Personal history of nicotine dependence: Secondary | ICD-10-CM | POA: Insufficient documentation

## 2021-01-31 DIAGNOSIS — E11621 Type 2 diabetes mellitus with foot ulcer: Secondary | ICD-10-CM | POA: Diagnosis not present

## 2021-02-02 NOTE — Progress Notes (Signed)
AMARIZ, FLAMENCO ([id number]) Visit Report for 01/31/2021 Arrival Information Details Patient Name: Date of Service: Nicole Golden 01/31/2021 2:15 PM Medical Record Number: [id number] Patient Account Number: [id number] Date of Birth/Sex: Treating RN: [birth year] (60 y.o. Nicole Golden Primary Care Provider: PA TEL, Darel Hong Other Clinician: Referring Provider: Treating Provider/Extender: Kalman Shan PA TEL, NILA Y Weeks in Treatment: 0 Visit Information History Since Last Visit Added or deleted any medications: No Patient Arrived: Ambulatory Any new allergies or adverse reactions: No Arrival Time: 15:03 Had a fall or experienced change in No Accompanied By: self activities of daily living that may affect Transfer Assistance: None risk of falls: Patient Identification Verified: Yes Signs or symptoms of abuse/neglect since last visito No Secondary Verification Process Completed: Yes Hospitalized since last visit: No Patient Requires Transmission-Based Precautions: No Implantable device outside of the clinic excluding No Patient Has Alerts: Yes cellular tissue based products placed in the center Patient Alerts: 08/2020 ABI R 0.91 since last visit: Has Dressing in Place as Prescribed: Yes Pain Present Now: No Electronic Signature(s) Signed: 02/01/2021 2:13:29 PM By: Sandre Kitty Entered By: Sandre Kitty on 01/31/2021 15:05:07 -------------------------------------------------------------------------------- Clinic Level of Care Assessment Details Patient Name: Date of Service: Nicole Golden 01/31/2021 2:15 PM Medical Record Number: [id number] Patient Account Number: [id number] Date of Birth/Sex: Treating RN: 21-Jan-1961 (60 y.o. Nicole Golden, Meta.Reding Primary Care Provider: PA TEL, Darel Hong Other Clinician: Referring Provider: Treating Provider/Extender: Kalman Shan PA TEL, NILA Y Weeks in Treatment: 0 Clinic Level of Care Assessment Items TOOL 4 Quantity  Score X- 1 0 Use when only an EandM is performed on FOLLOW-UP visit ASSESSMENTS - Nursing Assessment / Reassessment X- 1 10 Reassessment of Co-morbidities (includes updates in patient status) X- 1 5 Reassessment of Adherence to Treatment Plan ASSESSMENTS - Wound and Skin A ssessment / Reassessment X - Simple Wound Assessment / Reassessment - one wound 1 5 [] - 0 Complex Wound Assessment / Reassessment - multiple wounds X- 1 10 Dermatologic / Skin Assessment (not related to wound area) ASSESSMENTS - Focused Assessment X- 1 5 Circumferential Edema Measurements - multi extremities X- 1 10 Nutritional Assessment / Counseling / Intervention [] - 0 Lower Extremity Assessment (monofilament, tuning fork, pulses) [] - 0 Peripheral Arterial Disease Assessment (using hand held doppler) ASSESSMENTS - Ostomy and/or Continence Assessment and Care [] - 0 Incontinence Assessment and Management [] - 0 Ostomy Care Assessment and Management (repouching, etc.) PROCESS - Coordination of Care X - Simple Patient / Family Education for ongoing care 1 15 [] - 0 Complex (extensive) Patient / Family Education for ongoing care X- 1 10 Staff obtains Programmer, systems, Records, T Results / Process Orders est [] - 0 Staff telephones HHA, Nursing Homes / Clarify orders / etc [] - 0 Routine Transfer to another Facility (non-emergent condition) [] - 0 Routine Hospital Admission (non-emergent condition) [] - 0 New Admissions / Biomedical engineer / Ordering NPWT Apligraf, etc. , [] - 0 Emergency Hospital Admission (emergent condition) X- 1 10 Simple Discharge Coordination [] - 0 Complex (extensive) Discharge Coordination PROCESS - Special Needs [] - 0 Pediatric / Minor Patient Management [] - 0 Isolation Patient Management [] - 0 Hearing / Language / Visual special needs [] - 0 Assessment of Community assistance (transportation, D/C planning, etc.) [] - 0 Additional assistance / Altered  mentation [] - 0 Support Surface(s) Assessment (bed, cushion, seat, etc.) INTERVENTIONS - Wound Cleansing / Measurement X - Simple Wound Cleansing -  one wound 1 5 [] - 0 Complex Wound Cleansing - multiple wounds X- 1 5 Wound Imaging (photographs - any number of wounds) [] - 0 Wound Tracing (instead of photographs) X- 1 5 Simple Wound Measurement - one wound [] - 0 Complex Wound Measurement - multiple wounds INTERVENTIONS - Wound Dressings X - Small Wound Dressing one or multiple wounds 1 10 [] - 0 Medium Wound Dressing one or multiple wounds [] - 0 Large Wound Dressing one or multiple wounds [] - 0 Application of Medications - topical [] - 0 Application of Medications - injection INTERVENTIONS - Miscellaneous [] - 0 External ear exam [] - 0 Specimen Collection (cultures, biopsies, blood, body fluids, etc.) [] - 0 Specimen(s) / Culture(s) sent or taken to Lab for analysis [] - 0 Patient Transfer (multiple staff / Civil Service fast streamer / Similar devices) [] - 0 Simple Staple / Suture removal (25 or less) [] - 0 Complex Staple / Suture removal (26 or more) [] - 0 Hypo / Hyperglycemic Management (close monitor of Blood Glucose) [] - 0 Ankle / Brachial Index (ABI) - do not check if billed separately X- 1 5 Vital Signs Has the patient been seen at the hospital within the last three years: Yes Total Score: 110 Level Of Care: New/Established - Level 3 Electronic Signature(s) Signed: 01/31/2021 5:44:33 PM By: Deon Pilling Entered By: Deon Pilling on 01/31/2021 15:25:57 -------------------------------------------------------------------------------- Encounter Discharge Information Details Patient Name: Date of Service: Nicole Golden, Nicole Burns J. 01/31/2021 2:15 PM Medical Record Number: [id number] Patient Account Number: [id number] Date of Birth/Sex: Treating RN: August 21, 1961 (60 y.o. Nicole Golden Primary Care Provider: PA TEL, Darel Hong Other Clinician: Referring Provider: Treating  Provider/Extender: Kalman Shan PA TEL, Ena Dawley in Treatment: 0 Encounter Discharge Information Items Discharge Condition: Stable Ambulatory Status: Ambulatory Discharge Destination: Home Transportation: Private Auto Accompanied By: self Schedule Follow-up Appointment: Yes Clinical Summary of Care: Electronic Signature(s) Signed: 01/31/2021 5:44:33 PM By: Deon Pilling Entered By: Deon Pilling on 01/31/2021 17:38:30 -------------------------------------------------------------------------------- Lower Extremity Assessment Details Patient Name: Date of Service: Nicole Golden. 01/31/2021 2:15 PM Medical Record Number: [id number] Patient Account Number: [id number] Date of Birth/Sex: Treating RN: 1961/06/01 (60 y.o. Nicole Golden Primary Care Provider: PA TEL, Darel Hong Other Clinician: Referring Provider: Treating Provider/Extender: Kalman Shan PA TEL, NILA Y Weeks in Treatment: 0 Edema Assessment Assessed: [Left: No] [Right: Yes] Edema: [Left: N] [Right: o] Calf Left: Right: Point of Measurement: 30 cm From Medial Instep 34 cm Ankle Left: Right: Point of Measurement: 11 cm From Medial Instep 23 cm Vascular Assessment Pulses: Dorsalis Pedis Palpable: [Right:Yes] Electronic Signature(s) Signed: 01/31/2021 5:38:29 PM By: Lorrin Jackson Entered By: Lorrin Jackson on 01/31/2021 15:11:32 -------------------------------------------------------------------------------- Multi Wound Chart Details Patient Name: Date of Service: Nicole Golden, Nicole Burns J. 01/31/2021 2:15 PM Medical Record Number: [id number] Patient Account Number: [id number] Date of Birth/Sex: Treating RN: [birth year] (60 y.o. Nicole Golden Primary Care Provider: PA TEL, Darel Hong Other Clinician: Referring Provider: Treating Provider/Extender: Kalman Shan PA TEL, NILA Y Weeks in Treatment: 0 Vital Signs Height(in): 60 Pulse(bpm): 45 Weight(lbs): 200 Blood Pressure(mmHg): 128/70 Body Mass  Index(BMI): 30 Temperature(F): 98.2 Respiratory Rate(breaths/min): 20 Photos: [2:No Photos Right T Fifth oe] [N/A:N/A N/A] Wound Location: [2:Blister] [N/A:N/A] Wounding Event: [2:Diabetic Wound/Ulcer of the Lower] [N/A:N/A] Primary Etiology: [2:Extremity Hypertension, Type II Diabetes,] [N/A:N/A] Comorbid History: [2:Neuropathy 01/11/2021] [N/A:N/A] Date Acquired: [2:0] [N/A:N/A] Weeks of Treatment: [2:Open] [N/A:N/A] Wound Status: [2:0.6x0.4x0.2] [N/A:N/A] Measurements L x W x D (  cm) [2:0.188] [N/A:N/A] A (cm) : rea [2:0.038] [N/A:N/A] Volume (cm) : [2:66.70%] [N/A:N/A] % Reduction in A rea: [2:83.20%] [N/A:N/A] % Reduction in Volume: [2:Grade 2] [N/A:N/A] Classification: [2:Medium] [N/A:N/A] Exudate A mount: [2:Serosanguineous] [N/A:N/A] Exudate Type: [2:red, brown] [N/A:N/A] Exudate Color: [2:Distinct, outline attached] [N/A:N/A] Wound Margin: [2:Large (67-100%)] [N/A:N/A] Granulation A mount: [2:Red, Pink] [N/A:N/A] Granulation Quality: [2:Small (1-33%)] [N/A:N/A] Necrotic A mount: [2:Fat Layer (Subcutaneous Tissue): Yes N/A] Exposed Structures: [2:Fascia: No Tendon: No Muscle: No Joint: No Bone: No Medium (34-66%)] [N/A:N/A] Treatment Notes Electronic Signature(s) Signed: 01/31/2021 3:33:01 PM By: Kalman Shan DO Signed: 02/02/2021 5:57:05 PM By: Levan Hurst RN, BSN Entered By: Kalman Shan on 01/31/2021 15:29:17 -------------------------------------------------------------------------------- [place] Details Patient Name: Date of Service: Nicole Golden, Nicole Burns J. 01/31/2021 2:15 PM Medical Record Number: [id number] Patient Account Number: [id number] Date of Birth/Sex: Treating RN: Oct 02, 1960 (60 y.o. Nicole Golden Primary Care Provider: PA TEL, Darel Hong Other Clinician: Referring Provider: Treating Provider/Extender: Kalman Shan PA TEL, NILA Y Weeks in Treatment: 0 Active Inactive Nutrition Nursing Diagnoses: Impaired glucose  control: actual or potential Goals: Patient/caregiver agrees to and verbalizes understanding of need to obtain nutritional consultation Date Initiated: 01/25/2021 Target Resolution Date: 02/11/2021 Goal Status: Active Interventions: Assess HgA1c results as ordered upon admission and as needed Provide education on elevated blood sugars and impact on wound healing Provide education on nutrition Treatment Activities: Obtain HgA1c : 01/25/2021 Patient referred to Primary Care Physician for further nutritional evaluation : 01/25/2021 Notes: Pain, Acute or Chronic Nursing Diagnoses: Potential alteration in comfort, pain Goals: Patient will verbalize adequate pain control and receive pain control interventions during procedures as needed Date Initiated: 01/25/2021 Target Resolution Date: 02/11/2021 Goal Status: Active Patient/caregiver will verbalize comfort level met Date Initiated: 01/25/2021 Target Resolution Date: 02/18/2021 Goal Status: Active Interventions: Encourage patient to take pain medications as prescribed Provide education on pain management Reposition patient for comfort Treatment Activities: Administer pain control measures as ordered : 01/25/2021 Notes: Wound/Skin Impairment Nursing Diagnoses: Knowledge deficit related to ulceration/compromised skin integrity Goals: Patient/caregiver will verbalize understanding of skin care regimen Date Initiated: 01/25/2021 Target Resolution Date: 02/24/2021 Goal Status: Active Interventions: Assess patient/caregiver ability to obtain necessary supplies Assess patient/caregiver ability to perform ulcer/skin care regimen upon admission and as needed Provide education on ulcer and skin care Treatment Activities: Skin care regimen initiated : 01/25/2021 Topical wound management initiated : 01/25/2021 Notes: Electronic Signature(s) Signed: 01/31/2021 5:44:33 PM By: Deon Pilling Entered By: Deon Pilling on 01/31/2021  15:22:07 -------------------------------------------------------------------------------- Pain Assessment Details Patient Name: Date of Service: Nicole Golden. 01/31/2021 2:15 PM Medical Record Number: [id number] Patient Account Number: [id number] Date of Birth/Sex: Treating RN: Sep 10, 1960 (60 y.o. Nicole Golden Primary Care Provider: PA TEL, Darel Hong Other Clinician: Referring Provider: Treating Provider/Extender: Kalman Shan PA TEL, NILA Y Weeks in Treatment: 0 Active Problems Location of Pain Severity and Description of Pain Patient Has Paino No Site Locations Pain Management and Medication Current Pain Management: Electronic Signature(s) Signed: 02/01/2021 2:13:29 PM By: Sandre Kitty Signed: 02/02/2021 5:57:05 PM By: Levan Hurst RN, BSN Entered By: Sandre Kitty on 01/31/2021 15:05:31 -------------------------------------------------------------------------------- Patient/Caregiver Education Details Patient Name: Date of Service: Nicole Golden 6/6/2022andnbsp2:15 PM Medical Record Number: [id number] Patient Account Number: [id number] Date of Birth/Gender: Treating RN: [birth year] (60 y.o. Nicole Golden, Meta.Reding Primary Care Physician: PA TEL, Darel Hong Other Clinician: Referring Physician: Treating Physician/Extender: Kalman Shan PA TEL, Ena Dawley in Treatment: 0 Education Assessment Education Provided To: Patient Education Topics  Provided Elevated Blood Sugar/ Impact on Healing: Handouts: Elevated Blood Sugars: How Do They Affect Wound Healing Methods: Explain/Verbal Responses: Reinforcements needed Electronic Signature(s) Signed: 01/31/2021 5:44:33 PM By: Deon Pilling Entered By: Deon Pilling on 01/31/2021 15:22:24 -------------------------------------------------------------------------------- Wound Assessment Details Patient Name: Date of Service: Nicole Golden. 01/31/2021 2:15 PM Medical Record Number: [id number] Patient Account  Number: [id number] Date of Birth/Sex: Treating RN: 23-Oct-1960 (59 y.o. Nicole Golden Primary Care Sabreen Kitchen: PA TEL, Darel Hong Other Clinician: Referring Hurshel Bouillon: Treating Niamh Rada/Extender: Kalman Shan PA TEL, NILA Y Weeks in Treatment: 0 Wound Status Wound Number: 2 Primary Etiology: Diabetic Wound/Ulcer of the Lower Extremity Wound Location: Right T Fifth oe Wound Status: Open Wounding Event: Blister Comorbid History: Hypertension, Type II Diabetes, Neuropathy Date Acquired: 01/11/2021 Weeks Of Treatment: 0 Clustered Wound: No Photos Wound Measurements Length: (cm) 0.6 Width: (cm) 0.4 Depth: (cm) 0.2 Area: (cm) 0.188 Volume: (cm) 0.038 % Reduction in Area: 66.7% % Reduction in Volume: 83.2% Epithelialization: Medium (34-66%) Tunneling: No Undermining: No Wound Description Classification: Grade 2 Wound Margin: Distinct, outline attached Exudate Amount: Medium Exudate Type: Serosanguineous Exudate Color: red, brown Foul Odor After Cleansing: No Slough/Fibrino Yes Wound Bed Granulation Amount: Large (67-100%) Exposed Structure Granulation Quality: Red, Pink Fascia Exposed: No Necrotic Amount: Small (1-33%) Fat Layer (Subcutaneous Tissue) Exposed: Yes Necrotic Quality: Adherent Slough Tendon Exposed: No Muscle Exposed: No Joint Exposed: No Bone Exposed: No Treatment Notes Wound #2 (Toe Fifth) Wound Laterality: Right Cleanser Soap and Water Discharge Instruction: May shower and wash wound with dial antibacterial soap and water prior to dressing change. Peri-Wound Care Topical Primary Dressing KerraCel Ag Gelling Fiber Dressing, 2x2 in (silver alginate) Discharge Instruction: Apply silver alginate ***pack lightly*** into wound bed as instructed Secondary Dressing Woven Gauze Sponges 2x2 in Discharge Instruction: Apply over primary dressing as directed. Secured With 4M Glade Surgical T ape, 2x2 (in/yd) Discharge Instruction: Secure  dressing with tape as directed. Compression Wrap Compression Stockings Add-Ons Electronic Signature(s) Signed: 02/01/2021 2:13:29 PM By: Sandre Kitty Signed: 02/01/2021 5:30:54 PM By: Lorrin Jackson Previous Signature: 01/31/2021 5:38:29 PM Version By: Lorrin Jackson Entered By: Sandre Kitty on 02/01/2021 14:06:47 -------------------------------------------------------------------------------- Vitals Details Patient Name: Date of Service: Nicole Golden, Nicole Burns J. 01/31/2021 2:15 PM Medical Record Number: [id number] Patient Account Number: [id number] Date of Birth/Sex: Treating RN: 03/07/1961 (60 y.o. Nicole Golden Primary Care Jaeda Bruso: PA TEL, Darel Hong Other Clinician: Referring Aleksei Goodlin: Treating Ayo Smoak/Extender: Kalman Shan PA TEL, NILA Y Weeks in Treatment: 0 Vital Signs Time Taken: 15:05 Temperature (F): 98.2 Height (in): 68 Pulse (bpm): 81 Weight (lbs): 200 Respiratory Rate (breaths/min): 20 Body Mass Index (BMI): 30.4 Blood Pressure (mmHg): 128/70 Reference Range: 80 - 120 mg / dl Electronic Signature(s) Signed: 02/01/2021 2:13:29 PM By: Sandre Kitty Entered By: Sandre Kitty on 01/31/2021 15:05:25

## 2021-02-02 NOTE — Progress Notes (Signed)
TINYA, CADOGAN ([id number]) Visit Report for 01/31/2021 Chief Complaint Document Details Patient Name: Date of Service: Nicole Golden 01/31/2021 2:15 PM Medical Record Number: [id number] Patient Account Number: [id number] Date of Birth/Sex: Treating RN: [birth year] (60 y.o. [name] Primary Care Provider: PA TEL, Darel Hong Other Clinician: Referring Provider: Treating Provider/Extender: Kalman Shan PA TEL, Ena Dawley in Treatment: 0 Information Obtained from: Patient Chief Complaint right fifth lateral toe wound Electronic Signature(s) Signed: 01/31/2021 3:33:01 PM By: Kalman Shan DO Entered By: Kalman Shan on 01/31/2021 15:29:31 -------------------------------------------------------------------------------- HPI Details Patient Name: Date of Service: Nicole Golden, Nicole Burns J. 01/31/2021 2:15 PM Medical Record Number: [id number] Patient Account Number: [id number] Date of Birth/Sex: Treating RN: 01/14/1961 (60 y.o. [name] Primary Care Provider: PA TEL, Darel Hong Other Clinician: Referring Provider: Treating Provider/Extender: Kalman Shan PA TEL, Ena Dawley in Treatment: 0 History of Present Illness HPI Description: Admission 5/31 [name] is a 60 year old female with a past medical history of uncontrolled type 2 diabetes and previous diabetic foot ulcer that presents today for a 2-week history of wound to her fifth right lateral toe. She states that her feet are narrow and that they move around in her shoes and she thinks that it rubbed up against causing a wound. She has been using silver alginate to the area. She denies signs of infection. 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 6/6; patient presents for 1 week follow-up she has been using silver alginate daily. She has been using her surgical shoe at home. She uses her regular shoe when driving. She denies any signs of infection. Electronic  Signature(s) Signed: 01/31/2021 3:33:01 PM By: Kalman Shan DO Entered By: Kalman Shan on 01/31/2021 15:30:10 -------------------------------------------------------------------------------- Physical Exam Details Patient Name: Date of Service: Nicole Golden 01/31/2021 2:15 PM Medical Record Number: [id number] Patient Account Number: [id number] Date of Birth/Sex: Treating RN: 1961/08/23 (60 y.o. [name] Primary Care Provider: Other Clinician: PA TEL, Darel Hong Referring Provider: Treating Provider/Extender: Kalman Shan PA TEL, NILA Y Weeks in Treatment: 0 Constitutional respirations regular, non-labored and within target range for patient.. Cardiovascular 2+ dorsalis pedis/posterior tibialis pulses. Psychiatric pleasant and cooperative. Notes Right foot: Fifth lateral toe with open wound and granulation tissue present. Maceration noted. No acute signs of infection. No significant edema on exam. Electronic Signature(s) Signed: 01/31/2021 3:33:01 PM By: Kalman Shan DO Entered By: Kalman Shan on 01/31/2021 15:31:04 -------------------------------------------------------------------------------- Physician Orders Details Patient Name: Date of Service: Nicole Jester J. 01/31/2021 2:15 PM Medical Record Number: [id number] Patient Account Number: [id number] Date of Birth/Sex: Treating RN: Feb 14, 1961 (60 y.o. Nicole Golden Primary Care Provider: PA TEL, Darel Hong Other Clinician: Referring Provider: Treating Provider/Extender: Kalman Shan PA TEL, Ena Dawley in Treatment: 0 Verbal / Phone Orders: No Diagnosis Coding ICD-10 Coding Code Description E11.621 Type 2 diabetes mellitus with foot ulcer E11.65 Type 2 diabetes mellitus with hyperglycemia E11.40 Type 2 diabetes mellitus with diabetic neuropathy, unspecified Follow-up Appointments ppointment in 1 week. - Dr. Dellia Nims 02/07/2021 Return A ppointment in 2 weeks. - Dr. Heber [place] 02/14/2021 Return  A Bathing/ Shower/ Hygiene May shower and wash wound with soap and water. - with dressing changes only. Edema Control - Lymphedema / SCD / Other Elevate legs to the level of the heart or above for 30 minutes daily and/or when sitting, a frequency of: - throughout the day. Avoid standing for long periods  of time. Moisturize legs daily. - both legs nightly. Off-Loading Open toe surgical shoe to: - wear while walking and standing. Wound Treatment Wound #2 - T Fifth oe Wound Laterality: Right Cleanser: Soap and Water 1 x Per Day/15 Days Discharge Instructions: May shower and wash wound with dial antibacterial soap and water prior to dressing change. Prim Dressing: KerraCel Ag Gelling Fiber Dressing, 2x2 in (silver alginate) (Generic) 1 x Per Day/15 Days ary Discharge Instructions: Apply silver alginate ***pack lightly*** into wound bed as instructed Secondary Dressing: Woven Gauze Sponges 2x2 in (Generic) 1 x Per Day/15 Days Discharge Instructions: Apply over primary dressing as directed. Secured With: 23M Medipore H Soft Cloth Surgical Tape, 2x2 (in/yd) (Generic) 1 x Per Day/15 Days Discharge Instructions: Secure dressing with tape as directed. Electronic Signature(s) Signed: 01/31/2021 3:33:01 PM By: Kalman Shan DO Entered By: Kalman Shan on 01/31/2021 15:31:18 -------------------------------------------------------------------------------- Problem List Details Patient Name: Date of Service: Nicole Golden, Nicole Burns J. 01/31/2021 2:15 PM Medical Record Number: [id number] Patient Account Number: [id number] Date of Birth/Sex: Treating RN: [birth year] (60 y.o. Nicole Golden Primary Care Provider: PA TEL, Darel Hong Other Clinician: Referring Provider: Treating Provider/Extender: Kalman Shan PA TEL, Ena Dawley in Treatment: 0 Active Problems ICD-10 Encounter Code Description Active Date MDM Diagnosis E11.621 Type 2 diabetes mellitus with foot ulcer 01/25/2021 No Yes E11.65 Type  2 diabetes mellitus with hyperglycemia 01/26/2021 No Yes E11.40 Type 2 diabetes mellitus with diabetic neuropathy, unspecified 01/26/2021 No Yes Inactive Problems Resolved Problems Electronic Signature(s) Signed: 01/31/2021 3:33:01 PM By: Kalman Shan DO Entered By: Kalman Shan on 01/31/2021 15:29:10 -------------------------------------------------------------------------------- Progress Note Details Patient Name: Date of Service: Nicole Jester J. 01/31/2021 2:15 PM Medical Record Number: [id number] Patient Account Number: [id number] Date of Birth/Sex: Treating RN: November 26, 1960 (60 y.o. [name] Primary Care Provider: PA TEL, Darel Hong Other Clinician: Referring Provider: Treating Provider/Extender: Kalman Shan PA TEL, Ena Dawley in Treatment: 0 Subjective Chief Complaint Information obtained from Patient right fifth lateral toe wound History of Present Illness (HPI) Admission 5/31 [name] is a 60 year old female with a past medical history of uncontrolled type 2 diabetes and previous diabetic foot ulcer that presents today for a 2-week history of wound to her fifth right lateral toe. She states that her feet are narrow and that they move around in her shoes and she thinks that it rubbed up against causing a wound. She has been using silver alginate to the area. She denies signs of infection. 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 6/6; patient presents for 1 week follow-up she has been using silver alginate daily. She has been using her surgical shoe at home. She uses her regular shoe when driving. She denies any signs of infection. Patient History Information obtained from Patient. Family History Diabetes - Father,Siblings,Child, Hypertension - Mother, No family history of Cancer, Heart Disease, Hereditary Spherocytosis, Kidney Disease, Lung Disease, Seizures, Stroke, Thyroid Problems, Tuberculosis. Social  History Former smoker - quit 30 years ago, Marital Status - Widowed, Alcohol Use - Never, Drug Use - No History, Caffeine Use - Moderate - coffee, tea. Medical History Eyes Denies history of Cataracts, Glaucoma, Optic Neuritis Ear/Nose/Mouth/Throat Denies history of Chronic sinus problems/congestion, Middle ear problems Hematologic/Lymphatic Denies history of Anemia, Hemophilia, Human Immunodeficiency Virus, Lymphedema, Sickle Cell Disease Respiratory Denies history of Aspiration, Asthma, Chronic Obstructive Pulmonary Disease (COPD), Pneumothorax, Sleep Apnea, Tuberculosis Cardiovascular Patient has history of Hypertension Denies history  of Angina, Arrhythmia, Congestive Heart Failure, Coronary Artery Disease, Deep Vein Thrombosis, Hypotension, Myocardial Infarction, Peripheral Arterial Disease, Peripheral Venous Disease, Phlebitis Gastrointestinal Denies history of Cirrhosis , Colitis, Crohnoos, Hepatitis A, Hepatitis B, Hepatitis C Endocrine Patient has history of Type II Diabetes Denies history of Type I Diabetes Genitourinary Denies history of End Stage Renal Disease Immunological Denies history of Lupus Erythematosus, Raynaudoos, Scleroderma Integumentary (Skin) Denies history of History of Burn Musculoskeletal Denies history of Gout, Rheumatoid Arthritis, Osteoarthritis, Osteomyelitis Neurologic Patient has history of Neuropathy Denies history of Dementia, Quadriplegia, Seizure Disorder Oncologic Denies history of Received Chemotherapy, Received Radiation Psychiatric Denies history of Anorexia/bulimia, Confinement Anxiety Medical A Surgical History Notes nd Eyes Hypertensive Retinopathy Cardiovascular Hyperlipdemia Objective Constitutional respirations regular, non-labored and within target range for patient.. Vitals Time Taken: 3:05 PM, Height: 68 in, Weight: 200 lbs, BMI: 30.4, Temperature: 98.2 F, Pulse: 81 bpm, Respiratory Rate: 20 breaths/min, Blood  Pressure: 128/70 mmHg. Cardiovascular 2+ dorsalis pedis/posterior tibialis pulses. Psychiatric pleasant and cooperative. General Notes: Right foot: Fifth lateral toe with open wound and granulation tissue present. Maceration noted. No acute signs of infection. No significant edema on exam. Integumentary (Hair, Skin) Wound #2 status is Open. Original cause of wound was Blister. The date acquired was: 01/11/2021. The wound is located on the Right T Fifth. The wound oe measures 0.6cm length x 0.4cm width x 0.2cm depth; 0.188cm^2 area and 0.038cm^3 volume. There is Fat Layer (Subcutaneous Tissue) exposed. There is no tunneling or undermining noted. There is a medium amount of serosanguineous drainage noted. The wound margin is distinct with the outline attached to the wound base. There is large (67-100%) red, pink granulation within the wound bed. There is a small (1-33%) amount of necrotic tissue within the wound bed including Adherent Slough. Assessment Active Problems ICD-10 Type 2 diabetes mellitus with foot ulcer Type 2 diabetes mellitus with hyperglycemia Type 2 diabetes mellitus with diabetic neuropathy, unspecified Patient's wound has shown improvement in size since last clinic visit. She still very close to the bone. No signs of acute infection. I recommended continuing to use silver alginate daily. She will follow-up in 1 week Plan Follow-up Appointments: Return Appointment in 1 week. - Dr. Dellia Nims 02/07/2021 Return Appointment in 2 weeks. - Dr. Heber [place] 02/14/2021 Bathing/ Shower/ Hygiene: May shower and wash wound with soap and water. - with dressing changes only. Edema Control - Lymphedema / SCD / Other: Elevate legs to the level of the heart or above for 30 minutes daily and/or when sitting, a frequency of: - throughout the day. Avoid standing for long periods of time. Moisturize legs daily. - both legs nightly. Off-Loading: Open toe surgical shoe to: - wear while walking and  standing. WOUND #2: - T Fifth Wound Laterality: Right oe Cleanser: Soap and Water 1 x Per Day/15 Days Discharge Instructions: May shower and wash wound with dial antibacterial soap and water prior to dressing change. Prim Dressing: KerraCel Ag Gelling Fiber Dressing, 2x2 in (silver alginate) (Generic) 1 x Per Day/15 Days ary Discharge Instructions: Apply silver alginate ***pack lightly*** into wound bed as instructed Secondary Dressing: Woven Gauze Sponges 2x2 in (Generic) 1 x Per Day/15 Days Discharge Instructions: Apply over primary dressing as directed. Secured With: 22M Medipore H Soft Cloth Surgical T ape, 2x2 (in/yd) (Generic) 1 x Per Day/15 Days Discharge Instructions: Secure dressing with tape as directed. 1. Silver alginate 2. Continue aggressive offloading 3. Follow-up in 1 week Electronic Signature(s) Signed: 01/31/2021 3:33:01 PM By: Kalman Shan DO Entered By:  Kalman Shan on 01/31/2021 15:32:19 -------------------------------------------------------------------------------- HxROS Details Patient Name: Date of Service: Nicole Golden 01/31/2021 2:15 PM Medical Record Number: [id number] Patient Account Number: [id number] Date of Birth/Sex: Treating RN: 1961-02-24 (60 y.o. [name] Primary Care Provider: PA TEL, Darel Hong Other Clinician: Referring Provider: Treating Provider/Extender: Kalman Shan PA TEL, Ena Dawley in Treatment: 0 Information Obtained From Patient Eyes Medical History: Negative for: Cataracts; Glaucoma; Optic Neuritis Past Medical History Notes: Hypertensive Retinopathy Ear/Nose/Mouth/Throat Medical History: Negative for: Chronic sinus problems/congestion; Middle ear problems Hematologic/Lymphatic Medical History: Negative for: Anemia; Hemophilia; Human Immunodeficiency Virus; Lymphedema; Sickle Cell Disease Respiratory Medical History: Negative for: Aspiration; Asthma; Chronic Obstructive Pulmonary Disease (COPD);  Pneumothorax; Sleep Apnea; Tuberculosis Cardiovascular Medical History: Positive for: Hypertension Negative for: Angina; Arrhythmia; Congestive Heart Failure; Coronary Artery Disease; Deep Vein Thrombosis; Hypotension; Myocardial Infarction; Peripheral Arterial Disease; Peripheral Venous Disease; Phlebitis Past Medical History Notes: Hyperlipdemia Gastrointestinal Medical History: Negative for: Cirrhosis ; Colitis; Crohns; Hepatitis A; Hepatitis B; Hepatitis C Endocrine Medical History: Positive for: Type II Diabetes Negative for: Type I Diabetes Time with diabetes: 1984 Treated with: Insulin Blood sugar tested every day: Yes Tested : 3 times a day Genitourinary Medical History: Negative for: End Stage Renal Disease Immunological Medical History: Negative for: Lupus Erythematosus; Raynauds; Scleroderma Integumentary (Skin) Medical History: Negative for: History of Burn Musculoskeletal Medical History: Negative for: Gout; Rheumatoid Arthritis; Osteoarthritis; Osteomyelitis Neurologic Medical History: Positive for: Neuropathy Negative for: Dementia; Quadriplegia; Seizure Disorder Oncologic Medical History: Negative for: Received Chemotherapy; Received Radiation Psychiatric Medical History: Negative for: Anorexia/bulimia; Confinement Anxiety Immunizations Pneumococcal Vaccine: Received Pneumococcal Vaccination: Yes Implantable Devices None Family and Social History Cancer: No; Diabetes: Yes - Father,Siblings,Child; Heart Disease: No; Hereditary Spherocytosis: No; Hypertension: Yes - Mother; Kidney Disease: No; Lung Disease: No; Seizures: No; Stroke: No; Thyroid Problems: No; Tuberculosis: No; Former smoker - quit 30 years ago; Marital Status - Widowed; Alcohol Use: Never; Drug Use: No History; Caffeine Use: Moderate - coffee, tea; Financial Concerns: No; Food, Clothing or Shelter Needs: No; Support System Lacking: No; Transportation Concerns: No Electronic  Signature(s) Signed: 01/31/2021 3:33:01 PM By: Kalman Shan DO Signed: 02/02/2021 5:57:05 PM By: Levan Hurst RN, BSN Entered By: Kalman Shan on 01/31/2021 15:30:20 -------------------------------------------------------------------------------- SuperBill Details Patient Name: Date of Service: Nicole Golden 01/31/2021 Medical Record Number: [id number] Patient Account Number: [id number] Date of Birth/Sex: Treating RN: 05/02/1961 (60 y.o. Nicole Golden Primary Care Provider: PA TEL, Darel Hong Other Clinician: Referring Provider: Treating Provider/Extender: Kalman Shan PA TEL, Ena Dawley in Treatment: 0 Diagnosis Coding ICD-10 Codes Code Description E11.621 Type 2 diabetes mellitus with foot ulcer E11.65 Type 2 diabetes mellitus with hyperglycemia E11.40 Type 2 diabetes mellitus with diabetic neuropathy, unspecified Facility Procedures CPT4 Code: [id number] Description: 37628 - WOUND CARE VISIT-LEV 3 EST PT Modifier: Quantity: 1 Physician Procedures : CPT4 Code Description Modifier [id number] - WC PHYS LEVEL 3 - EST PT ICD-10 Diagnosis Description E11.621 Type 2 diabetes mellitus with foot ulcer E11.65 Type 2 diabetes mellitus with hyperglycemia E11.40 Type 2 diabetes mellitus with diabetic  neuropathy, unspecified Quantity: 1 Electronic Signature(s) Signed: 01/31/2021 3:33:01 PM By: Kalman Shan DO Entered By: Kalman Shan on 01/31/2021 15:32:37

## 2021-02-07 ENCOUNTER — Encounter (HOSPITAL_BASED_OUTPATIENT_CLINIC_OR_DEPARTMENT_OTHER): Payer: Medicare HMO | Admitting: Internal Medicine

## 2021-02-07 ENCOUNTER — Encounter (HOSPITAL_COMMUNITY): Payer: Self-pay | Admitting: Emergency Medicine

## 2021-02-07 ENCOUNTER — Emergency Department (HOSPITAL_COMMUNITY)
Admission: EM | Admit: 2021-02-07 | Discharge: 2021-02-08 | Disposition: A | Discharge status: Home / Self Care | Payer: Medicare HMO | Attending: Emergency Medicine | Admitting: Emergency Medicine

## 2021-02-07 ENCOUNTER — Other Ambulatory Visit: Payer: Self-pay

## 2021-02-07 DIAGNOSIS — L299 Pruritus, unspecified: Secondary | ICD-10-CM

## 2021-02-07 DIAGNOSIS — Z7982 Long term (current) use of aspirin: Secondary | ICD-10-CM | POA: Diagnosis not present

## 2021-02-07 DIAGNOSIS — E1140 Type 2 diabetes mellitus with diabetic neuropathy, unspecified: Secondary | ICD-10-CM | POA: Insufficient documentation

## 2021-02-07 DIAGNOSIS — E1165 Type 2 diabetes mellitus with hyperglycemia: Secondary | ICD-10-CM | POA: Diagnosis not present

## 2021-02-07 DIAGNOSIS — R000 Tachycardia, unspecified: Secondary | ICD-10-CM | POA: Insufficient documentation

## 2021-02-07 DIAGNOSIS — Z794 Long term (current) use of insulin: Secondary | ICD-10-CM | POA: Diagnosis not present

## 2021-02-07 DIAGNOSIS — E1110 Type 2 diabetes mellitus with ketoacidosis without coma: Secondary | ICD-10-CM | POA: Diagnosis not present

## 2021-02-07 DIAGNOSIS — Z87891 Personal history of nicotine dependence: Secondary | ICD-10-CM | POA: Diagnosis not present

## 2021-02-07 LAB — CBG MONITORING, ED: Glucose-Capillary: >600 mg/dL (ref 70–99)

## 2021-02-07 MED ORDER — SODIUM CHLORIDE 0.9 % IV BOLUS
1000.0000 mL | Freq: Once | INTRAVENOUS | Status: AC
  Administered 2021-02-07: 1000 mL via INTRAVENOUS

## 2021-02-07 NOTE — ED Triage Notes (Signed)
Pt c/o itching all over for a while now. Pt seen at [facility] yesterday for same and prescribed 2 medications. Pt states the itching has not improved.

## 2021-02-07 NOTE — ED Provider Notes (Signed)
[facility] EMERGENCY DEPARTMENT Provider Note   CSN: [id number] Arrival date & time: 02/07/21  2106     History Chief Complaint  Patient presents with   Pruritis    Nicole Golden is a 60 y.o. female.  Patient presents to the emergency department for evaluation of itching.  Patient reports that she has been having intermittent episodes of itching for a couple of months.  Itching has been quite severe for the last couple of days.  She was seen at another ER and prescribed prednisone and hydroxyzine.  She started those today but it has not helped.  She has also been taking Claritin, Benadryl, using topical Benadryl and calamine lotion.  Patient reports a fine rash associated with itching.  No tongue swelling, throat swelling or difficulty breathing.      Past Medical History:  Diagnosis Date   Diabetes mellitus without complication ([place])    Diabetic neuropathy ([place])    Diabetic neuropathy ([place])    Hypertension    Hypertensive retinopathy    OU    Patient Active Problem List   Diagnosis Date Noted   Constipation 05/08/2018   DKA (diabetic ketoacidosis) ([place]) 04/24/2016   Abdominal wall cellulitis 04/24/2016   Type II diabetes mellitus with complication, uncontrolled ([place]) 04/24/2016    Past Surgical History:  Procedure Laterality Date   CATARACT EXTRACTION Bilateral    EYE SURGERY Bilateral    Cat Sx   FOOT SURGERY     right foot   TUBAL LIGATION       OB History   No obstetric history on file.     Family History  Problem Relation Age of Onset   Breast cancer Paternal Aunt    Diabetes Father    Colon cancer Neg Hx    Colon polyps Neg Hx     Social History   Tobacco Use   Smoking status: Former    Pack years: 0.00   Smokeless tobacco: Never  Vaping Use   Vaping Use: Never used  Substance Use Topics   Alcohol use: Yes    Comment: occas   Drug use: Never    Home Medications Prior to Admission medications   Medication Sig Start Date End Date  Taking? Authorizing Provider  ACCU-CHEK AVIVA PLUS test strip  04/15/20   [provider]  acetaminophen (TYLENOL) 500 MG tablet Take 500 mg by mouth every 6 (six) hours as needed for fever. Patient not taking: Reported on 09/29/2020    [provider]  amLODipine (NORVASC) 2.5 MG tablet Take 2.5 mg by mouth daily.    [provider]  aspirin EC 81 MG tablet Take 81 mg by mouth daily. Swallow whole.    [provider]  atropine 1 % ophthalmic solution Place 1 drop into the left eye 2 (two) times daily. 08/06/20   [provider]  dorzolamide-timolol (COSOPT) 22.3-6.8 MG/ML ophthalmic solution Place 1 drop into the left eye 2 (two) times daily. 08/06/20   [provider]  furosemide (LASIX) 20 MG tablet Take 20 mg by mouth daily as needed for edema. 03/04/20   [provider]  Insulin Pen Needle (B-D ULTRAFINE III SHORT PEN) 31G X 8 MM MISC 1 each by Does not apply route as directed. 08/26/20   Cassandria Anger, MD  LANTUS SOLOSTAR 100 UNIT/ML Solostar Pen Inject 60 Units into the skin at bedtime. 03/04/20   [provider]  latanoprost (XALATAN) 0.005 % ophthalmic solution 1 drop daily. 08/10/20  [provider]  losartan-hydrochlorothiazide (HYZAAR) 100-12.5 MG tablet Take 1 tablet by mouth daily.    [provider]  NOVOLOG FLEXPEN 100 UNIT/ML FlexPen Inject 10-16 Units into the skin 3 (three) times daily before meals. 03/04/20   [provider]  prednisoLONE acetate (PRED FORTE) 1 % ophthalmic suspension Place 1 drop into the left eye 3 (three) times daily. 08/06/20   [provider]  rosuvastatin (CRESTOR) 40 MG tablet Take 40 mg by mouth daily.    [provider]  SANTYL ointment  05/07/20   [provider]    Allergies    Patient has no known allergies.  Review of Systems   Review of Systems  Skin:  Positive for rash.  All other systems reviewed and are  negative.  Physical Exam Updated Vital Signs BP (!) 157/82 (BP Location: Right Arm)   Pulse 88   Temp 98 F (36.7 C) (Oral)   Resp 18   Ht 5\' 8"  (1.727 m)   Wt 86.2 kg   SpO2 98%   BMI 28.89 kg/m   Physical Exam Vitals and nursing note reviewed.  Constitutional:      General: She is not in acute distress.    Appearance: Normal appearance. She is well-developed.  HENT:     Head: Normocephalic and atraumatic.     Right Ear: Hearing normal.     Left Ear: Hearing normal.     Nose: Nose normal.  Eyes:     Conjunctiva/sclera: Conjunctivae normal.     Pupils: Pupils are equal, round, and reactive to light.  Cardiovascular:     Rate and Rhythm: Regular rhythm. Tachycardia present.     Heart sounds: S1 normal and S2 normal. No murmur heard.   No friction rub. No gallop.  Pulmonary:     Effort: Pulmonary effort is normal. No respiratory distress.     Breath sounds: Normal breath sounds.  Chest:     Chest wall: No tenderness.  Abdominal:     General: Bowel sounds are normal.     Palpations: Abdomen is soft.     Tenderness: There is no abdominal tenderness. There is no guarding or rebound. Negative signs include Murphy's sign and McBurney's sign.     Hernia: No hernia is present.  Musculoskeletal:        General: Normal range of motion.     Cervical back: Normal range of motion and neck supple.  Skin:    General: Skin is warm and dry.     Findings: Rash (fine papular rash) present.  Neurological:     Mental Status: She is alert and oriented to person, place, and time.     GCS: GCS eye subscore is 4. GCS verbal subscore is 5. GCS motor subscore is 6.     Cranial Nerves: No cranial nerve deficit.     Sensory: No sensory deficit.     Coordination: Coordination normal.  Psychiatric:        Speech: Speech normal.        Behavior: Behavior normal.        Thought Content: Thought content normal.    ED Results / Procedures / Treatments   Labs (all labs ordered are listed,  but only abnormal results are displayed) Labs Reviewed  CBC - Abnormal; Notable for the following components:      Result Value   MCH 25.7 (*)    All other components within normal limits  COMPREHENSIVE METABOLIC PANEL - Abnormal; Notable for  the following components:   Sodium 129 (*)    Glucose, Bld 755 (*)    BUN 33 (*)    Creatinine, Ser 1.45 (*)    GFR, Estimated 41 (*)    All other components within normal limits  URINALYSIS, ROUTINE W REFLEX MICROSCOPIC - Abnormal; Notable for the following components:   Color, Urine STRAW (*)    Glucose, UA >=500 (*)    Hgb urine dipstick SMALL (*)    Protein, ur 100 (*)    Bacteria, UA RARE (*)    All other components within normal limits  BASIC METABOLIC PANEL - Abnormal; Notable for the following components:   Sodium 133 (*)    Glucose, Bld 581 (*)    BUN 32 (*)    Creatinine, Ser 1.32 (*)    GFR, Estimated 46 (*)    All other components within normal limits  CBG MONITORING, ED - Abnormal; Notable for the following components:   Glucose-Capillary >600 (*)    All other components within normal limits  CBG MONITORING, ED - Abnormal; Notable for the following components:   Glucose-Capillary 523 (*)    All other components within normal limits  CBG MONITORING, ED - Abnormal; Notable for the following components:   Glucose-Capillary 384 (*)    All other components within normal limits  CBG MONITORING, ED - Abnormal; Notable for the following components:   Glucose-Capillary 321 (*)    All other components within normal limits  CBG MONITORING, ED - Abnormal; Notable for the following components:   Glucose-Capillary 251 (*)    All other components within normal limits    EKG None  Radiology No results found.  Procedures Procedures   Medications Ordered in ED Medications  insulin regular, human (MYXREDLIN) 100 units/ 100 mL infusion ( Intravenous Stopped 02/08/21 0440)  lactated ringers infusion ( Intravenous Stopped 02/08/21  0440)  dextrose 50 % solution 0-50 mL (has no administration in time range)  sodium chloride 0.9 % bolus 1,000 mL (0 mLs Intravenous Stopped 02/08/21 0122)  diphenhydrAMINE (BENADRYL) capsule 50 mg (50 mg Oral Given 02/08/21 0439)  famotidine (PEPCID) tablet 20 mg (20 mg Oral Given 02/08/21 0439)    ED Course  I have reviewed the triage vital signs and the nursing notes.  Pertinent labs & imaging results that were available during my care of the patient were reviewed by me and considered in my medical decision making (see chart for details).    MDM Rules/Calculators/A&P                         Patient presents with diffuse recurrence of rash that is itchy in nature.  Etiology is unclear.  She was concerned about possible environmental allergen at her job but is no longer working there.  Patient has been using antihistamines without improvement.  She was put on prednisone by an outside ER.  She was found to be profoundly hyperglycemic.  No evidence of DKA.  Patient treated with IV insulin.  Blood sugar is much improved.  Will discharge, continue antihistamines.  Follow-up with primary care to have referral for allergist. Final Clinical Impression(s) / ED Diagnoses Final diagnoses:  Hyperglycemia due to diabetes mellitus ([place])  Pruritus    Rx / DC Orders ED Discharge Orders     None        [place] Takacs, Gwenyth Allegra, MD 02/08/21 [phone]

## 2021-02-08 DIAGNOSIS — L299 Pruritus, unspecified: Secondary | ICD-10-CM | POA: Diagnosis not present

## 2021-02-08 LAB — CBC
HCT: 38.7 % (ref 36.0–46.0)
Hemoglobin: 12.1 g/dL (ref 12.0–15.0)
MCH: 25.7 pg — ABNORMAL LOW (ref 26.0–34.0)
MCHC: 31.3 g/dL (ref 30.0–36.0)
MCV: 82.2 fL (ref 80.0–100.0)
Platelets: 227 10*3/uL (ref 150–400)
RBC: 4.71 MIL/uL (ref 3.87–5.11)
RDW: 13.7 % (ref 11.5–15.5)
WBC: 5.9 10*3/uL (ref 4.0–10.5)
nRBC: 0 % (ref 0.0–0.2)

## 2021-02-08 LAB — URINALYSIS, ROUTINE W REFLEX MICROSCOPIC
Bilirubin Urine: NEGATIVE
Glucose, UA: >=500 mg/dL — AB
Ketones, ur: NEGATIVE mg/dL
Leukocytes,Ua: NEGATIVE
Nitrite: NEGATIVE
Protein, ur: 100 mg/dL — AB
Specific Gravity, Urine: 1.014 (ref 1.005–1.030)
pH: 7 (ref 5.0–8.0)

## 2021-02-08 LAB — COMPREHENSIVE METABOLIC PANEL
ALT: 29 U/L (ref 0–44)
AST: 23 U/L (ref 15–41)
Albumin: 4 g/dL (ref 3.5–5.0)
Alkaline Phosphatase: 100 U/L (ref 38–126)
Anion gap: 8 (ref 5–15)
BUN: 33 mg/dL — ABNORMAL HIGH (ref 8–23)
CO2: 22 mmol/L (ref 22–32)
Calcium: 9 mg/dL (ref 8.9–10.3)
Chloride: 99 mmol/L (ref 98–111)
Creatinine, Ser: 1.45 mg/dL — ABNORMAL HIGH (ref 0.44–1.00)
GFR, Estimated: 41 mL/min — ABNORMAL LOW (ref >60)
Glucose, Bld: 755 mg/dL (ref 70–99)
Potassium: 5 mmol/L (ref 3.5–5.1)
Sodium: 129 mmol/L — ABNORMAL LOW (ref 135–145)
Total Bilirubin: 0.7 mg/dL (ref 0.3–1.2)
Total Protein: 7.7 g/dL (ref 6.5–8.1)

## 2021-02-08 LAB — CBG MONITORING, ED
Glucose-Capillary: 523 mg/dL (ref 70–99)
Glucose-Capillary: 384 mg/dL — ABNORMAL HIGH (ref 70–99)
Glucose-Capillary: 321 mg/dL — ABNORMAL HIGH (ref 70–99)
Glucose-Capillary: 251 mg/dL — ABNORMAL HIGH (ref 70–99)

## 2021-02-08 LAB — BASIC METABOLIC PANEL
Anion gap: 8 (ref 5–15)
BUN: 32 mg/dL — ABNORMAL HIGH (ref 8–23)
CO2: 24 mmol/L (ref 22–32)
Calcium: 9.1 mg/dL (ref 8.9–10.3)
Chloride: 101 mmol/L (ref 98–111)
Creatinine, Ser: 1.32 mg/dL — ABNORMAL HIGH (ref 0.44–1.00)
GFR, Estimated: 46 mL/min — ABNORMAL LOW (ref >60)
Glucose, Bld: 581 mg/dL (ref 70–99)
Potassium: 4.3 mmol/L (ref 3.5–5.1)
Sodium: 133 mmol/L — ABNORMAL LOW (ref 135–145)

## 2021-02-08 MED ORDER — FAMOTIDINE 20 MG PO TABS
20.0000 mg | ORAL_TABLET | Freq: Once | ORAL | Status: AC
  Administered 2021-02-08: 20 mg via ORAL
  Filled 2021-02-08: qty 1

## 2021-02-08 MED ORDER — DIPHENHYDRAMINE HCL 25 MG PO CAPS
50.0000 mg | ORAL_CAPSULE | Freq: Once | ORAL | Status: AC
  Administered 2021-02-08: 50 mg via ORAL
  Filled 2021-02-08: qty 2

## 2021-02-08 MED ORDER — LACTATED RINGERS IV SOLN: INTRAVENOUS | Status: DC

## 2021-02-08 MED ORDER — INSULIN REGULAR(HUMAN) IN NACL 100-0.9 UT/100ML-% IV SOLN
INTRAVENOUS | Status: DC
  Administered 2021-02-08: 16 [IU]/h via INTRAVENOUS
  Filled 2021-02-08: qty 100

## 2021-02-08 MED ORDER — DEXTROSE 50 % IV SOLN: 0.0000 mL | INTRAVENOUS | Status: DC | PRN

## 2021-02-08 NOTE — Discharge Instructions (Addendum)
Take Benadryl 50 mg every 6 hours and Pepcid twice a day.

## 2021-02-08 NOTE — ED Notes (Signed)
Date and time results received: 02/08/21 0045 (use smartphrase ".now" to insert current time)  Test: glucose Critical Value: 755  Name of Provider Notified: Dr. Betsey Holiday  Orders Received? Or Actions Taken?: no/na

## 2021-02-08 NOTE — ED Notes (Signed)
Date and time results received: 02/08/21 0206 (use smartphrase ".now" to insert current time)  Test: glucose Critical Value: 581  Name of Provider Notified: Dr. Betsey Holiday  Orders Received? Or Actions Taken?: no/na

## 2021-02-09 ENCOUNTER — Encounter (INDEPENDENT_AMBULATORY_CARE_PROVIDER_SITE_OTHER): Payer: Medicare HMO | Admitting: Ophthalmology

## 2021-02-12 ENCOUNTER — Encounter (HOSPITAL_COMMUNITY): Payer: Self-pay | Admitting: Emergency Medicine

## 2021-02-12 ENCOUNTER — Other Ambulatory Visit: Payer: Self-pay

## 2021-02-12 ENCOUNTER — Emergency Department (HOSPITAL_COMMUNITY)
Admission: EM | Admit: 2021-02-12 | Discharge: 2021-02-13 | Disposition: A | Discharge status: Home / Self Care | Payer: Medicare HMO | Attending: Emergency Medicine | Admitting: Emergency Medicine

## 2021-02-12 DIAGNOSIS — E1140 Type 2 diabetes mellitus with diabetic neuropathy, unspecified: Secondary | ICD-10-CM | POA: Diagnosis not present

## 2021-02-12 DIAGNOSIS — L539 Erythematous condition, unspecified: Secondary | ICD-10-CM | POA: Insufficient documentation

## 2021-02-12 DIAGNOSIS — I10 Essential (primary) hypertension: Secondary | ICD-10-CM | POA: Diagnosis not present

## 2021-02-12 DIAGNOSIS — Z7982 Long term (current) use of aspirin: Secondary | ICD-10-CM | POA: Diagnosis not present

## 2021-02-12 DIAGNOSIS — Z79899 Other long term (current) drug therapy: Secondary | ICD-10-CM | POA: Insufficient documentation

## 2021-02-12 DIAGNOSIS — Z87891 Personal history of nicotine dependence: Secondary | ICD-10-CM | POA: Insufficient documentation

## 2021-02-12 DIAGNOSIS — Z794 Long term (current) use of insulin: Secondary | ICD-10-CM | POA: Diagnosis not present

## 2021-02-12 DIAGNOSIS — R21 Rash and other nonspecific skin eruption: Secondary | ICD-10-CM | POA: Diagnosis present

## 2021-02-12 DIAGNOSIS — T7840XA Allergy, unspecified, initial encounter: Secondary | ICD-10-CM

## 2021-02-12 NOTE — ED Provider Notes (Signed)
[facility] EMERGENCY DEPARTMENT Provider Note   CSN: [id number] Arrival date & time: 02/12/21  2115     History Chief Complaint  Patient presents with   Allergic Reaction    Nicole Golden is a 60 y.o. female.  Patient being treated by primary care doctor for an itchy rash.  Patient is being treated with an antifungal cream.  And another cream which is sort of antiparasite. Patient just started these on Friday.  And also taking Pepcid and taking Benadryl.  States it is not really helping the itching skin itching and rash that she has had.  Today she started to get some redness and swelling to her arms and the left side of the face.  It is of where she is applying the cream.  Denies any tongue swelling or trouble breathing.  Patient does not want to be placed on prednisone because it is made her blood sugars go very high in the past.  She is a type II diabetic.  Has been applying these creams all over her skin.  But the only part that has kind of the redness or the arms.  Possible son may have sensitized it.  But not sure why it is doing it just to the left side of the face.      Past Medical History:  Diagnosis Date   Diabetes mellitus without complication ([place])    Diabetic neuropathy ([place])    Diabetic neuropathy ([place])    Hypertension    Hypertensive retinopathy    OU    Patient Active Problem List   Diagnosis Date Noted   Constipation 05/08/2018   DKA (diabetic ketoacidosis) ([place]) 04/24/2016   Abdominal wall cellulitis 04/24/2016   Type II diabetes mellitus with complication, uncontrolled ([place]) 04/24/2016    Past Surgical History:  Procedure Laterality Date   CATARACT EXTRACTION Bilateral    EYE SURGERY Bilateral    Cat Sx   FOOT SURGERY     right foot   TUBAL LIGATION       OB History   No obstetric history on file.     Family History  Problem Relation Age of Onset   Breast cancer Paternal Aunt    Diabetes Father    Colon cancer Neg Hx    Colon  polyps Neg Hx     Social History   Tobacco Use   Smoking status: Former    Pack years: 0.00   Smokeless tobacco: Never  Vaping Use   Vaping Use: Never used  Substance Use Topics   Alcohol use: Yes    Comment: occas   Drug use: Never    Home Medications Prior to Admission medications   Medication Sig Start Date End Date Taking? Authorizing Provider  ACCU-CHEK AVIVA PLUS test strip  04/15/20   [provider]  acetaminophen (TYLENOL) 500 MG tablet Take 500 mg by mouth every 6 (six) hours as needed for fever. Patient not taking: Reported on 09/29/2020    [provider]  amLODipine (NORVASC) 2.5 MG tablet Take 2.5 mg by mouth daily.    [provider]  aspirin EC 81 MG tablet Take 81 mg by mouth daily. Swallow whole.    [provider]  atropine 1 % ophthalmic solution Place 1 drop into the left eye 2 (two) times daily. 08/06/20   [provider]  dorzolamide-timolol (COSOPT) 22.3-6.8 MG/ML ophthalmic solution Place 1 drop into the left eye 2 (two) times daily. 08/06/20   [provider]  furosemide (LASIX) 20 MG tablet Take 20 mg by mouth daily as needed for edema. 03/04/20   [provider]  Insulin Pen Needle (B-D ULTRAFINE III SHORT PEN) 31G X 8 MM MISC 1 each by Does not apply route as directed. 08/26/20   Cassandria Anger, MD  LANTUS SOLOSTAR 100 UNIT/ML Solostar Pen Inject 60 Units into the skin at bedtime. 03/04/20   [provider]  latanoprost (XALATAN) 0.005 % ophthalmic solution 1 drop daily. 08/10/20   [provider]  losartan-hydrochlorothiazide (HYZAAR) 100-12.5 MG tablet Take 1 tablet by mouth daily.    [provider]  NOVOLOG FLEXPEN 100 UNIT/ML FlexPen Inject 10-16 Units into the skin 3 (three) times daily before meals. 03/04/20   [provider]  prednisoLONE acetate (PRED FORTE) 1 % ophthalmic suspension Place 1 drop into the left eye 3 (three) times daily. 08/06/20    [provider]  rosuvastatin (CRESTOR) 40 MG tablet Take 40 mg by mouth daily.    [provider]  SANTYL ointment  05/07/20   [provider]    Allergies    Patient has no known allergies.  Review of Systems   Review of Systems  Constitutional:  Negative for chills and fever.  HENT:  Negative for ear pain and sore throat.   Eyes:  Negative for pain and visual disturbance.  Respiratory:  Negative for cough and shortness of breath.   Cardiovascular:  Negative for chest pain and palpitations.  Gastrointestinal:  Negative for abdominal pain and vomiting.  Genitourinary:  Negative for dysuria and hematuria.  Musculoskeletal:  Negative for arthralgias and back pain.  Skin:  Positive for rash. Negative for color change.  Neurological:  Negative for seizures and syncope.  All other systems reviewed and are negative.  Physical Exam Updated Vital Signs BP (!) 141/91   Pulse (!) 102   Temp 99 F (37.2 C) (Oral)   Resp 19   Ht 1.727 m (5\' 8" )   Wt 86.2 kg   SpO2 99%   BMI 28.89 kg/m   Physical Exam Vitals and nursing note reviewed.  Constitutional:      General: She is not in acute distress.    Appearance: She is well-developed.  HENT:     Head: Normocephalic and atraumatic.     Comments: Some erythema to the left side of the face may be with some slight swelling    Mouth/Throat:     Pharynx: Oropharynx is clear.     Comments: No tongue or lip swelling. Eyes:     Conjunctiva/sclera: Conjunctivae normal.  Cardiovascular:     Rate and Rhythm: Normal rate and regular rhythm.     Heart sounds: No murmur heard. Pulmonary:     Effort: Pulmonary effort is normal. No respiratory distress.     Breath sounds: Normal breath sounds. No stridor. No wheezing, rhonchi or rales.  Abdominal:     Palpations: Abdomen is soft.     Tenderness: There is no abdominal tenderness.  Musculoskeletal:        General: Swelling present.     Cervical back: Neck supple.   Skin:    General: Skin is warm and dry.     Capillary Refill: Capillary refill takes less than 2 seconds.     Findings: Erythema and rash present. No lesion.     Comments: Bilateral forearms with some erythema.  Some slight swelling.  No blisters.  There are some papules that were existing prior to today.  Has similar things on her legs.  No open wounds.  No skin peeling no vesicles.  Neurological:     General: No focal deficit present.     Mental Status: She is alert and oriented to person, place, and time.    ED Results / Procedures / Treatments   Labs (all labs ordered are listed, but only abnormal results are displayed) Labs Reviewed - No data to display  EKG None  Radiology No results found.  Procedures Procedures   Medications Ordered in ED Medications - No data to display  ED Course  I have reviewed the triage vital signs and the nursing notes.  Pertinent labs & imaging results that were available during my care of the patient were reviewed by me and considered in my medical decision making (see chart for details).    MDM Rules/Calculators/A&P                          Patient without anything that represents a systemic allergic reaction.  May be some local irritant or a local contact dermatitis developing from the creams.  Patient without any wheezing no tongue swelling no lip swelling.  Patient does not want to start on prednisone she is already taking Pepcid and Benadryl.  She does not want take prednisone because it makes her blood sugars go very high so understand that.  We will have her stop both of the creams and continue the Benadryl and Pepcid as she is able to follow back up with her primary care doctor on Monday.  They have been talking in terms of maybe getting her to her allergist which would be wise.  Patient will return for any new or worse symptoms. Final Clinical Impression(s) / ED Diagnoses Final diagnoses:  Allergic reaction, initial encounter     Rx / DC Orders ED Discharge Orders     None        Fredia Sorrow, MD 02/13/21 [phone]

## 2021-02-12 NOTE — Discharge Instructions (Signed)
Continue to take the Pepcid and the Benadryl take the Pepcid twice a day.  Take the Benadryl every 6 hours.  Make an appointment to follow back up with your regular doctor.  I would hold the 2 cream medications.  I understand that you do not want to be started on prednisone because of blood sugar problems.  Return for any new or worse symptoms to include trouble breathing or swelling of the tongue.

## 2021-02-12 NOTE — ED Triage Notes (Signed)
Pt c/o bilateral arm swelling and left side of face swelling that started today after starting a new medication yesterday

## 2021-02-14 ENCOUNTER — Other Ambulatory Visit: Payer: Self-pay

## 2021-02-14 ENCOUNTER — Encounter (HOSPITAL_BASED_OUTPATIENT_CLINIC_OR_DEPARTMENT_OTHER): Payer: Medicare HMO | Admitting: Internal Medicine

## 2021-02-14 DIAGNOSIS — E11621 Type 2 diabetes mellitus with foot ulcer: Secondary | ICD-10-CM

## 2021-02-14 NOTE — Progress Notes (Addendum)
Nicole Golden, IPOCK ([id number]) Visit Report for 02/14/2021 Arrival Information Details Patient Name: Date of Service: Nicole Golden 02/14/2021 2:45 PM Medical Record Number: [id number] Patient Account Number: [id number] Date of Birth/Sex: Treating RN: 08-22-1961 (60 y.o. Sue Lush Primary Care Beautifull Cisar: PA TEL, Darel Hong Other Clinician: Referring Ronith Berti: Treating Rica Heather/Extender: Kalman Shan PA TEL, Ena Dawley in Treatment: 2 Visit Information History Since Last Visit Added or deleted any medications: No Patient Arrived: Ambulatory Any new allergies or adverse reactions: No Arrival Time: 15:27 Had a fall or experienced change in No Transfer Assistance: None activities of daily living that may affect Patient Identification Verified: Yes risk of falls: Secondary Verification Process Completed: Yes Signs or symptoms of abuse/neglect since last visito No Patient Requires Transmission-Based Precautions: No Hospitalized since last visit: No Patient Has Alerts: Yes Implantable device outside of the clinic excluding No Patient Alerts: 08/2020 ABI R 0.91 cellular tissue based products placed in the center since last visit: Has Dressing in Place as Prescribed: Yes Pain Present Now: No Electronic Signature(s) Signed: 02/14/2021 6:31:05 PM By: Lorrin Jackson Entered By: Lorrin Jackson on 02/14/2021 15:31:56 -------------------------------------------------------------------------------- Encounter Discharge Information Details Patient Name: Date of Service: Nicole Golden, Nicole Burns J. 02/14/2021 2:45 PM Medical Record Number: [id number] Patient Account Number: [id number] Date of Birth/Sex: Treating RN: 11-29-1960 (60 y.o. Sue Lush Primary Care Brennyn Haisley: PA TEL, Darel Hong Other Clinician: Referring Ruther Ephraim: Treating Seleni Meller/Extender: Kalman Shan PA TEL, Ena Dawley in Treatment: 2 Encounter Discharge Information Items Post Procedure Vitals Discharge Condition:  Stable Temperature (F): 98.3 Ambulatory Status: Ambulatory Pulse (bpm): 95 Discharge Destination: Home Respiratory Rate (breaths/min): 18 Transportation: Private Auto Blood Pressure (mmHg): 183/84 Schedule Follow-up Appointment: Yes Clinical Summary of Care: Provided on 02/14/2021 Form Type Recipient Paper Patient Patient Electronic Signature(s) Signed: 02/14/2021 6:31:05 PM By: Lorrin Jackson Entered By: Lorrin Jackson on 02/14/2021 16:24:38 -------------------------------------------------------------------------------- Lower Extremity Assessment Details Patient Name: Date of Service: Nicole Golden 02/14/2021 2:45 PM Medical Record Number: [id number] Patient Account Number: [id number] Date of Birth/Sex: Treating RN: [birth year] (60 y.o. Sue Lush Primary Care Estel Scholze: PA TEL, Darel Hong Other Clinician: Referring Koreena Joost: Treating Zahra Peffley/Extender: Kalman Shan PA TEL, NILA Y Weeks in Treatment: 2 Edema Assessment Assessed: [Left: No] [Right: Yes] Edema: [Left: N] [Right: o] Calf Left: Right: Point of Measurement: 30 cm From Medial Instep 33 cm Ankle Left: Right: Point of Measurement: 11 cm From Medial Instep 22 cm Vascular Assessment Pulses: Dorsalis Pedis Palpable: [Right:Yes] Electronic Signature(s) Signed: 02/14/2021 6:31:05 PM By: Lorrin Jackson Entered By: Lorrin Jackson on 02/14/2021 15:41:42 -------------------------------------------------------------------------------- Multi Wound Chart Details Patient Name: Date of Service: Nicole Jester J. 02/14/2021 2:45 PM Medical Record Number: [id number] Patient Account Number: [id number] Date of Birth/Sex: Treating RN: November 30, 1960 (60 y.o. [name] Primary Care Zaida Reiland: PA TEL, Darel Hong Other Clinician: Referring Narcissa Melder: Treating Danella Philson/Extender: Kalman Shan PA TEL, NILA Y Weeks in Treatment: 2 Vital Signs Height(in): 64 Pulse(bpm): 95 Weight(lbs): 200 Blood Pressure(mmHg):  183/84 Body Mass Index(BMI): 30 Temperature(F): 98.3 Respiratory Rate(breaths/min): 18 Photos: [2:No Photos Right T Fifth oe] [N/A:N/A N/A] Wound Location: [2:Blister] [N/A:N/A] Wounding Event: [2:Diabetic Wound/Ulcer of the Lower] [N/A:N/A] Primary Etiology: [2:Extremity Hypertension, Type II Diabetes,] [N/A:N/A] Comorbid History: [2:Neuropathy 01/11/2021] [N/A:N/A] Date Acquired: [2:2] [N/A:N/A] Weeks of Treatment: [2:Open] [N/A:N/A] Wound Status: [2:0.4x0.3x0.2] [N/A:N/A] Measurements L x W x D (cm) [2:0.094] [N/A:N/A] A (cm) : rea [2:0.019] [N/A:N/A] Volume (cm) : [2:83.40%] [N/A:N/A] % Reduction in A rea: [2:91.60%] [N/A:N/A] %  Reduction in Volume: [2:Grade 2] [N/A:N/A] Classification: [2:Medium] [N/A:N/A] Exudate A mount: [2:Serosanguineous] [N/A:N/A] Exudate Type: [2:red, brown] [N/A:N/A] Exudate Color: [2:Distinct, outline attached] [N/A:N/A] Wound Margin: [2:Large (67-100%)] [N/A:N/A] Granulation A mount: [2:Pink, Pale] [N/A:N/A] Granulation Quality: [2:Small (1-33%)] [N/A:N/A] Necrotic A mount: [2:Fat Layer (Subcutaneous Tissue): Yes N/A] Exposed Structures: [2:Fascia: No Tendon: No Muscle: No Joint: No Bone: No Medium (34-66%)] [N/A:N/A] Epithelialization: [2:Debridement - Excisional] [N/A:N/A] Debridement: Pre-procedure Verification/Time Out 16:05 [N/A:N/A] Taken: [2:Callus, Subcutaneous] [N/A:N/A] Tissue Debrided: [2:Skin/Subcutaneous Tissue] [N/A:N/A] Level: [2:0.12] [N/A:N/A] Debridement A (sq cm): [2:rea Curette] [N/A:N/A] Instrument: [2:Minimum] [N/A:N/A] Bleeding: [2:Pressure] [N/A:N/A] Hemostasis A chieved: [2:0] [N/A:N/A] Procedural Pain: [2:0] [N/A:N/A] Post Procedural Pain: [2:Procedure was tolerated well] [N/A:N/A] Debridement Treatment Response: [2:0.4x0.3x0.2] [N/A:N/A] Post Debridement Measurements L x W x D (cm) [2:0.019] [N/A:N/A] Post Debridement Volume: (cm) [2:Calloused Periwound] [N/A:N/A] Assessment Notes: [2:Debridement]  [N/A:N/A] Treatment Notes Wound #2 (Toe Fifth) Wound Laterality: Right Cleanser Soap and Water Discharge Instruction: May shower and wash wound with dial antibacterial soap and water prior to dressing change. Peri-Wound Care Topical Primary Dressing KerraCel Ag Gelling Fiber Dressing, 2x2 in (silver alginate) Discharge Instruction: Apply silver alginate ***pack lightly*** into wound bed as instructed Secondary Dressing Woven Gauze Sponges 2x2 in Discharge Instruction: Apply over primary dressing as directed. Secured With 72M [place] [facility] T ape, 2x2 (in/yd) Discharge Instruction: Secure dressing with tape as directed. Compression Wrap Compression Stockings Add-Ons Electronic Signature(s) Signed: 02/14/2021 5:23:05 PM By: Kalman Shan DO Signed: 02/14/2021 5:37:12 PM By: Levan Hurst RN, BSN Entered By: Kalman Shan on 02/14/2021 17:15:28 -------------------------------------------------------------------------------- [place] Details Patient Name: Date of Service: Nicole Golden 02/14/2021 2:45 PM Medical Record Number: [id number] Patient Account Number: [id number] Date of Birth/Sex: Treating RN: Jun 08, 1961 (60 y.o. [name] Primary Care Sriman Tally: PA TEL, Darel Hong Other Clinician: Referring Donna Snooks: Treating Cassandre Oleksy/Extender: Kalman Shan PA TEL, NILA Y Weeks in Treatment: 2 Active Inactive Wound/Skin Impairment Nursing Diagnoses: Knowledge deficit related to ulceration/compromised skin integrity Goals: Patient/caregiver will verbalize understanding of skin care regimen Date Initiated: 01/25/2021 Target Resolution Date: 02/25/2021 Goal Status: Active Interventions: Assess patient/caregiver ability to obtain necessary supplies Assess patient/caregiver ability to perform ulcer/skin care regimen upon admission and as needed Provide education on ulcer and skin care Treatment Activities: Skin care regimen  initiated : 01/25/2021 Topical wound management initiated : 01/25/2021 Notes: Electronic Signature(s) Signed: 02/14/2021 5:37:12 PM By: Levan Hurst RN, BSN Entered By: Levan Hurst on 02/14/2021 16:09:52 -------------------------------------------------------------------------------- Pain Assessment Details Patient Name: Date of Service: Nicole Golden 02/14/2021 2:45 PM Medical Record Number: [id number] Patient Account Number: [id number] Date of Birth/Sex: Treating RN: 05-08-1961 (60 y.o. Sue Lush Primary Care Tiyona Desouza: PA TEL, Darel Hong Other Clinician: Referring Kailena Lubas: Treating Bentlee Drier/Extender: Kalman Shan PA TEL, Ena Dawley in Treatment: 2 Active Problems Location of Pain Severity and Description of Pain Patient Has Paino No Site Locations Pain Management and Medication Current Pain Management: Electronic Signature(s) Signed: 02/14/2021 6:31:05 PM By: Lorrin Jackson Entered By: Lorrin Jackson on 02/14/2021 15:40:03 -------------------------------------------------------------------------------- Patient/Caregiver Education Details Patient Name: Date of Service: Nicole Golden 6/20/2022andnbsp2:45 PM Medical Record Number: [id number] Patient Account Number: [id number] Date of Birth/Gender: Treating RN: December 26, 1960 (60 y.o. [name] Primary Care Physician: PA TEL, Darel Hong Other Clinician: Referring Physician: Treating Physician/Extender: Kalman Shan PA TEL, Ena Dawley in Treatment: 2 Education Assessment Education Provided To: Patient Education Topics Provided Wound/Skin Impairment: Methods: Explain/Verbal Responses: State content correctly Electronic Signature(s) Signed: 02/14/2021 5:37:12  PM By: Levan Hurst RN, BSN Entered By: Levan Hurst on 02/14/2021 16:10:04 -------------------------------------------------------------------------------- Wound Assessment Details Patient Name: Date of Service: Nicole Golden 02/14/2021 2:45 PM Medical Record Number: [id number] Patient Account Number: [id number] Date of Birth/Sex: Treating RN: [birth year] (60 y.o. [name] Primary Care Milissa Fesperman: PA TEL, Darel Hong Other Clinician: Referring Liliani Bobo: Treating Arliss Frisina/Extender: Kalman Shan PA TEL, NILA Y Weeks in Treatment: 2 Wound Status Wound Number: 2 Primary Etiology: Diabetic Wound/Ulcer of the Lower Extremity Wound Location: Right T Fifth oe Wound Status: Open Wounding Event: Blister Comorbid History: Hypertension, Type II Diabetes, Neuropathy Date Acquired: 01/11/2021 Weeks Of Treatment: 2 Clustered Wound: No Photos Wound Measurements Length: (cm) 0.4 Width: (cm) 0.3 Depth: (cm) 0.2 Area: (cm) 0.094 Volume: (cm) 0.019 % Reduction in Area: 83.4% % Reduction in Volume: 91.6% Epithelialization: Medium (34-66%) Tunneling: No Undermining: No Wound Description Classification: Grade 2 Wound Margin: Distinct, outline attached Exudate Amount: Medium Exudate Type: Serosanguineous Exudate Color: red, brown Foul Odor After Cleansing: No Slough/Fibrino Yes Wound Bed Granulation Amount: Large (67-100%) Exposed Structure Granulation Quality: Pink, Pale Fascia Exposed: No Necrotic Amount: Small (1-33%) Fat Layer (Subcutaneous Tissue) Exposed: Yes Tendon Exposed: No Muscle Exposed: No Joint Exposed: No Bone Exposed: No Assessment Notes Calloused Periwound Treatment Notes Wound #2 (Toe Fifth) Wound Laterality: Right Cleanser Soap and Water Discharge Instruction: May shower and wash wound with dial antibacterial soap and water prior to dressing change. Peri-Wound Care Topical Primary Dressing KerraCel Ag Gelling Fiber Dressing, 2x2 in (silver alginate) Discharge Instruction: Apply silver alginate ***pack lightly*** into wound bed as instructed Secondary Dressing Woven Gauze Sponges 2x2 in Discharge Instruction: Apply over primary dressing as directed. Secured With 62M  [place] [facility] T ape, 2x2 (in/yd) Discharge Instruction: Secure dressing with tape as directed. Compression Wrap Compression Stockings Add-Ons Electronic Signature(s) Signed: 02/15/2021 10:33:03 AM By: Sandre Kitty Signed: 02/16/2021 5:46:25 PM By: Levan Hurst RN, BSN Previous Signature: 02/14/2021 5:37:12 PM Version By: Levan Hurst RN, BSN Entered By: Sandre Kitty on 02/15/2021 10:02:29 -------------------------------------------------------------------------------- Vitals Details Patient Name: Date of Service: Nicole Golden 02/14/2021 2:45 PM Medical Record Number: [id number] Patient Account Number: [id number] Date of Birth/Sex: Treating RN: April 07, 1961 (60 y.o. Sue Lush Primary Care Georgiann Neider: PA TEL, Darel Hong Other Clinician: Referring Merleen Picazo: Treating Sharrell Krawiec/Extender: Kalman Shan PA TEL, NILA Y Weeks in Treatment: 2 Vital Signs Time Taken: 15:31 Temperature (F): 98.3 Height (in): 68 Pulse (bpm): 95 Weight (lbs): 200 Respiratory Rate (breaths/min): 18 Body Mass Index (BMI): 30.4 Blood Pressure (mmHg): 183/84 Reference Range: 80 - 120 mg / dl Electronic Signature(s) Signed: 02/14/2021 6:31:05 PM By: Lorrin Jackson Entered By: Lorrin Jackson on 02/14/2021 15:32:50

## 2021-02-14 NOTE — Progress Notes (Signed)
REYES, FIFIELD ([id number]) Visit Report for 02/14/2021 Chief Complaint Document Details Patient Name: Date of Service: Nicole Golden 02/14/2021 2:45 PM Medical Record Number: [id number] Patient Account Number: [id number] Date of Birth/Sex: Treating RN: 1961-07-12 (60 y.o. Nicole Golden Primary Care Provider: PA TEL, Darel Hong Other Clinician: Referring Provider: Treating Provider/Extender: Nicole Shan PA TEL, Ena Dawley in Treatment: 2 Information Obtained from: Patient Chief Complaint right fifth lateral toe wound Electronic Signature(s) Signed: 02/14/2021 5:23:05 PM By: Nicole Shan DO Entered By: Nicole Golden on 02/14/2021 17:15:39 -------------------------------------------------------------------------------- Debridement Details Patient Name: Date of Service: Nicole Golden 02/14/2021 2:45 PM Medical Record Number: [id number] Patient Account Number: [id number] Date of Birth/Sex: Treating RN: Sep 05, 1960 (60 y.o. Nicole Golden Primary Care Provider: PA TEL, Darel Hong Other Clinician: Referring Provider: Treating Provider/Extender: Nicole Shan PA TEL, Ena Dawley in Treatment: 2 Debridement Performed for Assessment: Wound #2 Right T Fifth oe Performed By: Physician Nicole Shan, DO Debridement Type: Debridement Severity of Tissue Pre Debridement: Fat layer exposed Level of Consciousness (Pre-procedure): Awake and Alert Pre-procedure Verification/Time Out Yes - 16:05 Taken: Start Time: 16:05 T Area Debrided (L x W): otal 0.4 (cm) x 0.3 (cm) = 0.12 (cm) Tissue and other material debrided: Viable, Non-Viable, Callus, Subcutaneous, Skin: Epidermis Level: Skin/Subcutaneous Tissue Debridement Description: Excisional Instrument: Curette Bleeding: Minimum Hemostasis Achieved: Pressure End Time: 16:07 Procedural Pain: 0 Post Procedural Pain: 0 Response to Treatment: Procedure was tolerated well Level of Consciousness (Post- Awake and  Alert procedure): Post Debridement Measurements of Total Wound Length: (cm) 0.4 Width: (cm) 0.3 Depth: (cm) 0.2 Volume: (cm) 0.019 Character of Wound/Ulcer Post Debridement: Improved Severity of Tissue Post Debridement: Fat layer exposed Post Procedure Diagnosis Same as Pre-procedure Electronic Signature(s) Signed: 02/14/2021 5:23:05 PM By: Nicole Shan DO Signed: 02/14/2021 5:37:12 PM By: Nicole Hurst RN, BSN Entered By: Nicole Golden on 02/14/2021 16:08:36 -------------------------------------------------------------------------------- HPI Details Patient Name: Date of Service: Nicole Golden. 02/14/2021 2:45 PM Medical Record Number: [id number] Patient Account Number: [id number] Date of Birth/Sex: Treating RN: 06/24/1961 (60 y.o. Nicole Golden Primary Care Provider: PA Kristen Loader Other Clinician: Referring Provider: Treating Provider/Extender: Nicole Shan PA TEL, Ena Dawley in Treatment: 2 History of Present Illness HPI Description: Admission 5/31 [name] is a 60 year old female with a past medical history of uncontrolled type 2 diabetes and previous diabetic foot ulcer that presents today for a 2-week history of wound to her fifth right lateral toe. She states that her feet are narrow and that they move around in her shoes and she thinks that it rubbed up against causing a wound. She has been using silver alginate to the area. She denies signs of infection. 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 6/6; patient presents for 1 week follow-up she has been using silver alginate daily. She has been using her surgical shoe at home. She uses her regular shoe when driving. She denies any signs of infection. 6/20; patient presents for 2-week follow-up. She has been using silver alginate daily. She has developed a callus to the surrounding wound. She denies signs of infection. Electronic Signature(s) Signed: 02/14/2021  5:23:05 PM By: Nicole Shan DO Entered By: Nicole Golden on 02/14/2021 17:17:58 -------------------------------------------------------------------------------- Physical Exam Details Patient Name: Date of Service: Nicole Golden 02/14/2021 2:45 PM Medical Record Number: [id number] Patient Account Number: [id number] Date of Birth/Sex: Treating RN: [birth year] (60 y.o. F)  Nicole Golden Primary Care Provider: PA TEL, Darel Hong Other Clinician: Referring Provider: Treating Provider/Extender: Nicole Shan PA TEL, NILA Y Weeks in Treatment: 2 Constitutional respirations regular, non-labored and within target range for patient.Marland Kitchen Psychiatric pleasant and cooperative. Notes Right foot: Fifth lateral toe with open wound and maceration noted. Surrounding circumferential callus. No acute signs of infection. Electronic Signature(s) Signed: 02/14/2021 5:23:05 PM By: Nicole Shan DO Entered By: Nicole Golden on 02/14/2021 17:18:53 -------------------------------------------------------------------------------- Physician Orders Details Patient Name: Date of Service: Nicole Golden 02/14/2021 2:45 PM Medical Record Number: [id number] Patient Account Number: [id number] Date of Birth/Sex: Treating RN: 03/09/1961 (60 y.o. Nicole Golden Primary Care Provider: PA TEL, Darel Hong Other Clinician: Referring Provider: Treating Provider/Extender: Nicole Shan PA TEL, Ena Dawley in Treatment: 2 Verbal / Phone Orders: No Diagnosis Coding ICD-10 Coding Code Description E11.621 Type 2 diabetes mellitus with foot ulcer E11.65 Type 2 diabetes mellitus with hyperglycemia E11.40 Type 2 diabetes mellitus with diabetic neuropathy, unspecified Follow-up Appointments ppointment in 1 week. - with Dr. Heber [place] Return A Bathing/ Shower/ Hygiene May shower and wash wound with soap and water. - with dressing changes only. Edema Control - Lymphedema / SCD / Other Elevate legs to the  level of the heart or above for 30 minutes daily and/or when sitting, a frequency of: - throughout the day. Avoid standing for long periods of time. Moisturize legs daily. - both legs nightly. Off-Loading Open toe surgical shoe to: - wear while walking and standing. Wound Treatment Wound #2 - T Fifth oe Wound Laterality: Right Cleanser: Soap and Water 1 x Per Day/15 Days Discharge Instructions: May shower and wash wound with dial antibacterial soap and water prior to dressing change. Prim Dressing: KerraCel Ag Gelling Fiber Dressing, 2x2 in (silver alginate) (Generic) 1 x Per Day/15 Days ary Discharge Instructions: Apply silver alginate ***pack lightly*** into wound bed as instructed Secondary Dressing: Woven Gauze Sponges 2x2 in (Generic) 1 x Per Day/15 Days Discharge Instructions: Apply over primary dressing as directed. Secured With: 52M Medipore H Soft Cloth Surgical Tape, 2x2 (in/yd) (Generic) 1 x Per Day/15 Days Discharge Instructions: Secure dressing with tape as directed. Electronic Signature(s) Signed: 02/14/2021 5:23:05 PM By: Nicole Shan DO Entered By: Nicole Golden on 02/14/2021 17:19:45 -------------------------------------------------------------------------------- Problem List Details Patient Name: Date of Service: Nicole Golden 02/14/2021 2:45 PM Medical Record Number: [id number] Patient Account Number: [id number] Date of Birth/Sex: Treating RN: 13-Oct-1960 (60 y.o. Nicole Golden Primary Care Provider: PA TEL, Darel Hong Other Clinician: Referring Provider: Treating Provider/Extender: Nicole Shan PA TEL, Ena Dawley in Treatment: 2 Active Problems ICD-10 Encounter Code Description Active Date MDM Diagnosis E11.621 Type 2 diabetes mellitus with foot ulcer 01/25/2021 No Yes E11.65 Type 2 diabetes mellitus with hyperglycemia 01/26/2021 No Yes E11.40 Type 2 diabetes mellitus with diabetic neuropathy, unspecified 01/26/2021 No Yes Inactive  Problems Resolved Problems Electronic Signature(s) Signed: 02/14/2021 5:23:05 PM By: Nicole Shan DO Entered By: Nicole Golden on 02/14/2021 17:14:11 -------------------------------------------------------------------------------- Progress Note Details Patient Name: Date of Service: Nicole Golden 02/14/2021 2:45 PM Medical Record Number: [id number] Patient Account Number: [id number] Date of Birth/Sex: Treating RN: 1961/04/21 (60 y.o. Nicole Golden Primary Care Provider: PA TEL, Darel Hong Other Clinician: Referring Provider: Treating Provider/Extender: Nicole Shan PA TEL, Ena Dawley in Treatment: 2 Subjective Chief Complaint Information obtained from Patient right fifth lateral toe wound History of Present Illness (HPI) Admission 5/31 [name] is a 60 year old female with a past  medical history of uncontrolled type 2 diabetes and previous diabetic foot ulcer that presents today for a 2-week history of wound to her fifth right lateral toe. She states that her feet are narrow and that they move around in her shoes and she thinks that it rubbed up against causing a wound. She has been using silver alginate to the area. She denies signs of infection. 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 6/6; patient presents for 1 week follow-up she has been using silver alginate daily. She has been using her surgical shoe at home. She uses her regular shoe when driving. She denies any signs of infection. 6/20; patient presents for 2-week follow-up. She has been using silver alginate daily. She has developed a callus to the surrounding wound. She denies signs of infection. Patient History Information obtained from Patient. Family History Diabetes - Father,Siblings,Child, Hypertension - Mother, No family history of Cancer, Heart Disease, Hereditary Spherocytosis, Kidney Disease, Lung Disease, Seizures, Stroke, Thyroid Problems,  Tuberculosis. Social History Former smoker - quit 30 years ago, Marital Status - Widowed, Alcohol Use - Never, Drug Use - No History, Caffeine Use - Moderate - coffee, tea. Medical History Eyes Denies history of Cataracts, Glaucoma, Optic Neuritis Ear/Nose/Mouth/Throat Denies history of Chronic sinus problems/congestion, Middle ear problems Hematologic/Lymphatic Denies history of Anemia, Hemophilia, Human Immunodeficiency Virus, Lymphedema, Sickle Cell Disease Respiratory Denies history of Aspiration, Asthma, Chronic Obstructive Pulmonary Disease (COPD), Pneumothorax, Sleep Apnea, Tuberculosis Cardiovascular Patient has history of Hypertension Denies history of Angina, Arrhythmia, Congestive Heart Failure, Coronary Artery Disease, Deep Vein Thrombosis, Hypotension, Myocardial Infarction, Peripheral Arterial Disease, Peripheral Venous Disease, Phlebitis Gastrointestinal Denies history of Cirrhosis , Colitis, Crohnoos, Hepatitis A, Hepatitis B, Hepatitis C Endocrine Patient has history of Type II Diabetes Denies history of Type I Diabetes Genitourinary Denies history of End Stage Renal Disease Immunological Denies history of Lupus Erythematosus, Raynaudoos, Scleroderma Integumentary (Skin) Denies history of History of Burn Musculoskeletal Denies history of Gout, Rheumatoid Arthritis, Osteoarthritis, Osteomyelitis Neurologic Patient has history of Neuropathy Denies history of Dementia, Quadriplegia, Seizure Disorder Oncologic Denies history of Received Chemotherapy, Received Radiation Psychiatric Denies history of Anorexia/bulimia, Confinement Anxiety Medical A Surgical History Notes nd Eyes Hypertensive Retinopathy Cardiovascular Hyperlipdemia Objective Constitutional respirations regular, non-labored and within target range for patient.. Vitals Time Taken: 3:31 PM, Height: 68 in, Weight: 200 lbs, BMI: 30.4, Temperature: 98.3 F, Pulse: 95 bpm, Respiratory Rate: 18  breaths/min, Blood Pressure: 183/84 mmHg. Psychiatric pleasant and cooperative. General Notes: Right foot: Fifth lateral toe with open wound and maceration noted. Surrounding circumferential callus. No acute signs of infection. Integumentary (Hair, Skin) Wound #2 status is Open. Original cause of wound was Blister. The date acquired was: 01/11/2021. The wound has been in treatment 2 weeks. The wound is located on the Right T Fifth. The wound measures 0.4cm length x 0.3cm width x 0.2cm depth; 0.094cm^2 area and 0.019cm^3 volume. There is Fat Layer oe (Subcutaneous Tissue) exposed. There is no tunneling or undermining noted. There is a medium amount of serosanguineous drainage noted. The wound margin is distinct with the outline attached to the wound base. There is large (67-100%) pink, pale granulation within the wound bed. There is a small (1-33%) amount of necrotic tissue within the wound bed. General Notes: Calloused Periwound Assessment Active Problems ICD-10 Type 2 diabetes mellitus with foot ulcer Type 2 diabetes mellitus with hyperglycemia Type 2 diabetes mellitus with diabetic neuropathy, unspecified Overall wound is stable. Nonviable tissue and surrounding  callus was debrided. No signs of infection. We will continue with silver alginate. Procedures Wound #2 Pre-procedure diagnosis of Wound #2 is a Diabetic Wound/Ulcer of the Lower Extremity located on the Right T Fifth .Severity of Tissue Pre Debridement is: oe Fat layer exposed. There was a Excisional Skin/Subcutaneous Tissue Debridement with a total area of 0.12 sq cm performed by Nicole Shan, DO. With the following instrument(s): Curette to remove Viable and Non-Viable tissue/material. Material removed includes Callus, Subcutaneous Tissue, and Skin: Epidermis. No specimens were taken. A time out was conducted at 16:05, prior to the start of the procedure. A Minimum amount of bleeding was controlled with Pressure. The  procedure was tolerated well with a pain level of 0 throughout and a pain level of 0 following the procedure. Post Debridement Measurements: 0.4cm length x 0.3cm width x 0.2cm depth; 0.019cm^3 volume. Character of Wound/Ulcer Post Debridement is improved. Severity of Tissue Post Debridement is: Fat layer exposed. Post procedure Diagnosis Wound #2: Same as Pre-Procedure Plan Follow-up Appointments: Return Appointment in 1 week. - with Dr. Heber [place] Bathing/ Shower/ Hygiene: May shower and wash wound with soap and water. - with dressing changes only. Edema Control - Lymphedema / SCD / Other: Elevate legs to the level of the heart or above for 30 minutes daily and/or when sitting, a frequency of: - throughout the day. Avoid standing for long periods of time. Moisturize legs daily. - both legs nightly. Off-Loading: Open toe surgical shoe to: - wear while walking and standing. WOUND #2: - T Fifth Wound Laterality: Right oe Cleanser: Soap and Water 1 x Per Day/15 Days Discharge Instructions: May shower and wash wound with dial antibacterial soap and water prior to dressing change. Prim Dressing: KerraCel Ag Gelling Fiber Dressing, 2x2 in (silver alginate) (Generic) 1 x Per Day/15 Days ary Discharge Instructions: Apply silver alginate ***pack lightly*** into wound bed as instructed Secondary Dressing: Woven Gauze Sponges 2x2 in (Generic) 1 x Per Day/15 Days Discharge Instructions: Apply over primary dressing as directed. Secured With: 51M Medipore H Soft Cloth Surgical T ape, 2x2 (in/yd) (Generic) 1 x Per Day/15 Days Discharge Instructions: Secure dressing with tape as directed. 1. In office sharp debridement 2. Silver alginate 3. Follow-up in a week Electronic Signature(s) Signed: 02/14/2021 5:23:05 PM By: Nicole Shan DO Entered By: Nicole Golden on 02/14/2021 17:21:19 -------------------------------------------------------------------------------- HxROS Details Patient Name: Date of  Service: Nicole Golden 02/14/2021 2:45 PM Medical Record Number: [id number] Patient Account Number: [id number] Date of Birth/Sex: Treating RN: 08/31/1960 (60 y.o. Nicole Golden Primary Care Provider: PA TEL, Darel Hong Other Clinician: Referring Provider: Treating Provider/Extender: Nicole Shan PA TEL, Ena Dawley in Treatment: 2 Information Obtained From Patient Eyes Medical History: Negative for: Cataracts; Glaucoma; Optic Neuritis Past Medical History Notes: Hypertensive Retinopathy Ear/Nose/Mouth/Throat Medical History: Negative for: Chronic sinus problems/congestion; Middle ear problems Hematologic/Lymphatic Medical History: Negative for: Anemia; Hemophilia; Human Immunodeficiency Virus; Lymphedema; Sickle Cell Disease Respiratory Medical History: Negative for: Aspiration; Asthma; Chronic Obstructive Pulmonary Disease (COPD); Pneumothorax; Sleep Apnea; Tuberculosis Cardiovascular Medical History: Positive for: Hypertension Negative for: Angina; Arrhythmia; Congestive Heart Failure; Coronary Artery Disease; Deep Vein Thrombosis; Hypotension; Myocardial Infarction; Peripheral Arterial Disease; Peripheral Venous Disease; Phlebitis Past Medical History Notes: Hyperlipdemia Gastrointestinal Medical History: Negative for: Cirrhosis ; Colitis; Crohns; Hepatitis A; Hepatitis B; Hepatitis C Endocrine Medical History: Positive for: Type II Diabetes Negative for: Type I Diabetes Time with diabetes: 1984 Treated with: Insulin Blood sugar tested every day: Yes Tested : 3 times a day  Genitourinary Medical History: Negative for: End Stage Renal Disease Immunological Medical History: Negative for: Lupus Erythematosus; Raynauds; Scleroderma Integumentary (Skin) Medical History: Negative for: History of Burn Musculoskeletal Medical History: Negative for: Gout; Rheumatoid Arthritis; Osteoarthritis; Osteomyelitis Neurologic Medical History: Positive for:  Neuropathy Negative for: Dementia; Quadriplegia; Seizure Disorder Oncologic Medical History: Negative for: Received Chemotherapy; Received Radiation Psychiatric Medical History: Negative for: Anorexia/bulimia; Confinement Anxiety Immunizations Pneumococcal Vaccine: Received Pneumococcal Vaccination: Yes Implantable Devices None Family and Social History Cancer: No; Diabetes: Yes - Father,Siblings,Child; Heart Disease: No; Hereditary Spherocytosis: No; Hypertension: Yes - Mother; Kidney Disease: No; Lung Disease: No; Seizures: No; Stroke: No; Thyroid Problems: No; Tuberculosis: No; Former smoker - quit 30 years ago; Marital Status - Widowed; Alcohol Use: Never; Drug Use: No History; Caffeine Use: Moderate - coffee, tea; Financial Concerns: No; Food, Clothing or Shelter Needs: No; Support System Lacking: No; Transportation Concerns: No Electronic Signature(s) Signed: 02/14/2021 5:23:05 PM By: Nicole Shan DO Signed: 02/14/2021 5:37:12 PM By: Nicole Hurst RN, BSN Entered By: Nicole Golden on 02/14/2021 17:18:07 -------------------------------------------------------------------------------- [place] Details Patient Name: Date of Service: Nicole Golden 02/14/2021 Medical Record Number: [id number] Patient Account Number: [id number] Date of Birth/Sex: Treating RN: 01/28/1961 (61 y.o. Nicole Golden Primary Care Provider: PA TEL, Darel Hong Other Clinician: Referring Provider: Treating Provider/Extender: Nicole Shan PA TEL, Ena Dawley in Treatment: 2 Diagnosis Coding ICD-10 Codes Code Description E11.621 Type 2 diabetes mellitus with foot ulcer E11.65 Type 2 diabetes mellitus with hyperglycemia E11.40 Type 2 diabetes mellitus with diabetic neuropathy, unspecified Facility Procedures CPT4 Code: [id number] Description: 55732 - DEB SUBQ TISSUE 20 SQ CM/< ICD-10 Diagnosis Description E11.621 Type 2 diabetes mellitus with foot ulcer Modifier: Quantity: 1 Physician  Procedures : CPT4 Code Description Modifier [id number] - WC PHYS SUBQ TISS 20 SQ CM ICD-10 Diagnosis Description E11.621 Type 2 diabetes mellitus with foot ulcer Quantity: 1 Electronic Signature(s) Signed: 02/14/2021 5:23:05 PM By: Nicole Shan DO Entered By: Nicole Golden on 02/14/2021 17:21:42

## 2021-02-16 ENCOUNTER — Ambulatory Visit: Payer: Medicare HMO | Admitting: Nutrition

## 2021-02-21 ENCOUNTER — Encounter (HOSPITAL_BASED_OUTPATIENT_CLINIC_OR_DEPARTMENT_OTHER): Payer: Medicare HMO | Admitting: Internal Medicine

## 2021-02-21 ENCOUNTER — Other Ambulatory Visit: Payer: Self-pay

## 2021-02-21 DIAGNOSIS — E11621 Type 2 diabetes mellitus with foot ulcer: Secondary | ICD-10-CM

## 2021-02-22 NOTE — Progress Notes (Signed)
MARNELL, MCDANIEL ([id number]) Visit Report for 02/21/2021 Chief Complaint Document Details Patient Name: Date of Service: Nicole Golden 02/21/2021 1:30 PM Medical Record Number: [id number] Patient Account Number: [id number] Date of Birth/Sex: Treating RN: [birth year] (60 y.o. Sue Lush Primary Care Provider: PA TEL, Darel Hong Other Clinician: Referring Provider: Treating Provider/Extender: Kalman Shan PA TEL, Ena Dawley in Treatment: 3 Information Obtained from: Patient Chief Complaint right fifth lateral toe wound Electronic Signature(s) Signed: 02/21/2021 3:19:30 PM By: Kalman Shan DO Entered By: Kalman Shan on 02/21/2021 15:15:22 -------------------------------------------------------------------------------- Debridement Details Patient Name: Date of Service: Nicole Golden, Nicole Burns J. 02/21/2021 1:30 PM Medical Record Number: [id number] Patient Account Number: [id number] Date of Birth/Sex: Treating RN: 01/28/1961 (60 y.o. Sue Lush Primary Care Provider: PA TEL, Darel Hong Other Clinician: Referring Provider: Treating Provider/Extender: Kalman Shan PA TEL, Ena Dawley in Treatment: 3 Debridement Performed for Assessment: Wound #2 Right T Fifth oe Performed By: Physician Kalman Shan, DO Debridement Type: Debridement Severity of Tissue Pre Debridement: Fat layer exposed Level of Consciousness (Pre-procedure): Awake and Alert Pre-procedure Verification/Time Out Yes - 13:56 Taken: Start Time: 13:57 Pain Control: Lidocaine 5% topical ointment T Area Debrided (L x W): otal 0.3 (cm) x 0.3 (cm) = 0.09 (cm) Tissue and other material debrided: Non-Viable, Callus, Subcutaneous, Skin: Dermis Level: Skin/Subcutaneous Tissue Debridement Description: Excisional Instrument: Curette Bleeding: Minimum Hemostasis Achieved: Pressure End Time: 14:01 Response to Treatment: Procedure was tolerated well Level of Consciousness (Post- Awake and  Alert procedure): Post Debridement Measurements of Total Wound Length: (cm) 0.3 Width: (cm) 0.3 Depth: (cm) 0.2 Volume: (cm) 0.014 Character of Wound/Ulcer Post Debridement: Stable Severity of Tissue Post Debridement: Fat layer exposed Post Procedure Diagnosis Same as Pre-procedure Electronic Signature(s) Signed: 02/21/2021 3:19:30 PM By: Kalman Shan DO Signed: 02/21/2021 5:08:20 PM By: Lorrin Jackson Entered By: Lorrin Jackson on 02/21/2021 14:01:51 -------------------------------------------------------------------------------- HPI Details Patient Name: Date of Service: Nicole Golden, Nicole Burns J. 02/21/2021 1:30 PM Medical Record Number: [id number] Patient Account Number: [id number] Date of Birth/Sex: Treating RN: 07-30-1961 (60 y.o. Sue Lush Primary Care Provider: PA TEL, Darel Hong Other Clinician: Referring Provider: Treating Provider/Extender: Kalman Shan PA TEL, Ena Dawley in Treatment: 3 History of Present Illness HPI Description: Admission 5/31 [name] is Nicole 60 year old female with Nicole past medical history of uncontrolled type 2 diabetes and previous diabetic foot ulcer that presents today for Nicole 2-week history of wound to her fifth right lateral toe. She states that her feet are narrow and that they move around in her shoes and she thinks that it rubbed up against causing Nicole wound. She has been using silver alginate to the area. She denies signs of infection. 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 6/6; patient presents for 1 week follow-up she has been using silver alginate daily. She has been using her surgical shoe at home. She uses her regular shoe when driving. She denies any signs of infection. 6/20; patient presents for 2-week follow-up. She has been using silver alginate daily. She has developed Nicole callus to the surrounding wound. She denies signs of infection. 6/27; patient presents for 1 week follow-up. She has  been using silver alginate daily. She continues to develop significant callus around the wound. She denies signs of infection. Electronic Signature(s) Signed: 02/21/2021 3:19:30 PM By: Kalman Shan DO Entered By: Kalman Shan on 02/21/2021 15:15:46 -------------------------------------------------------------------------------- Physical Exam Details Patient Name: Date of Service: Nicole  Rupert Stacks J. 02/21/2021 1:30 PM Medical Record Number: [id number] Patient Account Number: [id number] Date of Birth/Sex: Treating RN: Feb 06, 1961 (60 y.o. Sue Lush Primary Care Provider: PA TEL, Darel Hong Other Clinician: Referring Provider: Treating Provider/Extender: Kalman Shan PA TEL, NILA Y Weeks in Treatment: 3 Constitutional respirations regular, non-labored and within target range for patient.. Cardiovascular 2+ dorsalis pedis/posterior tibialis pulses. Psychiatric pleasant and cooperative. Notes Right foot: Fifth lateral toe with open wound. Surrounding circumferential callus. No acute signs of infection. Electronic Signature(s) Signed: 02/21/2021 3:19:30 PM By: Kalman Shan DO Entered By: Kalman Shan on 02/21/2021 15:16:20 -------------------------------------------------------------------------------- Physician Orders Details Patient Name: Date of Service: Nicole Golden, Nicole Burns J. 02/21/2021 1:30 PM Medical Record Number: [id number] Patient Account Number: [id number] Date of Birth/Sex: Treating RN: 1961/07/28 (60 y.o. Sue Lush Primary Care Provider: PA TEL, Darel Hong Other Clinician: Referring Provider: Treating Provider/Extender: Kalman Shan PA TEL, Ena Dawley in Treatment: 3 Verbal / Phone Orders: No Diagnosis Coding ICD-10 Coding Code Description E11.621 Type 2 diabetes mellitus with foot ulcer E11.65 Type 2 diabetes mellitus with hyperglycemia E11.40 Type 2 diabetes mellitus with diabetic neuropathy, unspecified Follow-up  Appointments ppointment in 1 week. - with Dr. Heber [place] Return Nicole Bathing/ Shower/ Hygiene May shower and wash wound with soap and water. - with dressing changes only. Edema Control - Lymphedema / SCD / Other Elevate legs to the level of the heart or above for 30 minutes daily and/or when sitting, Nicole frequency of: - throughout the day. Avoid standing for long periods of time. Moisturize legs daily. - both legs nightly. Off-Loading Open toe surgical shoe to: - wear while walking and standing. Additional Orders / Instructions Follow Nutritious Diet Wound Treatment Wound #2 - T Fifth oe Wound Laterality: Right Cleanser: Soap and Water 1 x Per KZS/01 Days Discharge Instructions: May shower and wash wound with dial antibacterial soap and water prior to dressing change. Prim Dressing: KerraCel Ag Gelling Fiber Dressing, 2x2 in (silver alginate) (Generic) 1 x Per Day/15 Days ary Discharge Instructions: Apply silver alginate ***pack lightly*** into wound bed as instructed Secondary Dressing: Woven Gauze Sponges 2x2 in (Generic) 1 x Per Day/15 Days Discharge Instructions: Apply over primary dressing as directed. Secured With: 17M Medipore H Soft Cloth Surgical Tape, 2x2 (in/yd) (Generic) 1 x Per Day/15 Days Discharge Instructions: Secure dressing with tape as directed. Electronic Signature(s) Signed: 02/21/2021 3:19:30 PM By: Kalman Shan DO Previous Signature: 02/21/2021 1:45:34 PM Version By: Lorrin Jackson Entered By: Kalman Shan on 02/21/2021 15:16:33 -------------------------------------------------------------------------------- Problem List Details Patient Name: Date of Service: Nicole Golden, Nicole Burns J. 02/21/2021 1:30 PM Medical Record Number: [id number] Patient Account Number: [id number] Date of Birth/Sex: Treating RN: November 10, 1960 (60 y.o. Sue Lush Primary Care Provider: PA TEL, Darel Hong Other Clinician: Referring Provider: Treating Provider/Extender: Kalman Shan PA  TEL, Ena Dawley in Treatment: 3 Active Problems ICD-10 Encounter Code Description Active Date MDM Diagnosis E11.621 Type 2 diabetes mellitus with foot ulcer 01/25/2021 No Yes E11.65 Type 2 diabetes mellitus with hyperglycemia 01/26/2021 No Yes E11.40 Type 2 diabetes mellitus with diabetic neuropathy, unspecified 01/26/2021 No Yes Inactive Problems Resolved Problems Electronic Signature(s) Signed: 02/21/2021 3:19:30 PM By: Kalman Shan DO Previous Signature: 02/21/2021 1:45:10 PM Version By: Lorrin Jackson Entered By: Kalman Shan on 02/21/2021 15:15:06 -------------------------------------------------------------------------------- Progress Note Details Patient Name: Date of Service: Nicole Jester J. 02/21/2021 1:30 PM Medical Record Number: [id number] Patient Account Number: [id number] Date of Birth/Sex: Treating RN: 1960-12-08 (60 y.o. Sue Lush  Primary Care Provider: PA TEL, Chriss Driver Y Other Clinician: Referring Provider: Treating Provider/Extender: Kalman Shan PA TEL, Ena Dawley in Treatment: 3 Subjective Chief Complaint Information obtained from Patient right fifth lateral toe wound History of Present Illness (HPI) Admission 5/31 [name] is Nicole 60 year old female with Nicole past medical history of uncontrolled type 2 diabetes and previous diabetic foot ulcer that presents today for Nicole 2-week history of wound to her fifth right lateral toe. She states that her feet are narrow and that they move around in her shoes and she thinks that it rubbed up against causing Nicole wound. She has been using silver alginate to the area. She denies signs of infection. 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 6/6; patient presents for 1 week follow-up she has been using silver alginate daily. She has been using her surgical shoe at home. She uses her regular shoe when driving. She denies any signs of infection. 6/20; patient presents  for 2-week follow-up. She has been using silver alginate daily. She has developed Nicole callus to the surrounding wound. She denies signs of infection. 6/27; patient presents for 1 week follow-up. She has been using silver alginate daily. She continues to develop significant callus around the wound. She denies signs of infection. Patient History Information obtained from Patient. Family History Diabetes - Father,Siblings,Child, Hypertension - Mother, No family history of Cancer, Heart Disease, Hereditary Spherocytosis, Kidney Disease, Lung Disease, Seizures, Stroke, Thyroid Problems, Tuberculosis. Social History Former smoker - quit 30 years ago, Marital Status - Widowed, Alcohol Use - Never, Drug Use - No History, Caffeine Use - Moderate - coffee, tea. Medical History Eyes Denies history of Cataracts, Glaucoma, Optic Neuritis Ear/Nose/Mouth/Throat Denies history of Chronic sinus problems/congestion, Middle ear problems Hematologic/Lymphatic Denies history of Anemia, Hemophilia, Human Immunodeficiency Virus, Lymphedema, Sickle Cell Disease Respiratory Denies history of Aspiration, Asthma, Chronic Obstructive Pulmonary Disease (COPD), Pneumothorax, Sleep Apnea, Tuberculosis Cardiovascular Patient has history of Hypertension Denies history of Angina, Arrhythmia, Congestive Heart Failure, Coronary Artery Disease, Deep Vein Thrombosis, Hypotension, Myocardial Infarction, Peripheral Arterial Disease, Peripheral Venous Disease, Phlebitis Gastrointestinal Denies history of Cirrhosis , Colitis, Crohnoos, Hepatitis Nicole, Hepatitis B, Hepatitis C Endocrine Patient has history of Type II Diabetes Denies history of Type I Diabetes Genitourinary Denies history of End Stage Renal Disease Immunological Denies history of Lupus Erythematosus, Raynaudoos, Scleroderma Integumentary (Skin) Denies history of History of Burn Musculoskeletal Denies history of Gout, Rheumatoid Arthritis, Osteoarthritis,  Osteomyelitis Neurologic Patient has history of Neuropathy Denies history of Dementia, Quadriplegia, Seizure Disorder Oncologic Denies history of Received Chemotherapy, Received Radiation Psychiatric Denies history of Anorexia/bulimia, Confinement Anxiety Medical Nicole Surgical History Notes nd Eyes Hypertensive Retinopathy Cardiovascular Hyperlipdemia Objective Constitutional respirations regular, non-labored and within target range for patient.. Vitals Time Taken: 1:40 PM, Height: 68 in, Weight: 200 lbs, BMI: 30.4, Temperature: 98.4 F, Pulse: 84 bpm, Respiratory Rate: 18 breaths/min, Blood Pressure: 169/72 mmHg. Cardiovascular 2+ dorsalis pedis/posterior tibialis pulses. Psychiatric pleasant and cooperative. General Notes: Right foot: Fifth lateral toe with open wound. Surrounding circumferential callus. No acute signs of infection. Integumentary (Hair, Skin) Wound #2 status is Open. Original cause of wound was Blister. The date acquired was: 01/11/2021. The wound has been in treatment 3 weeks. The wound is located on the Right T Fifth. The wound measures 0.3cm length x 0.3cm width x 0.2cm depth; 0.071cm^2 area and 0.014cm^3 volume. There is Fat Layer oe (Subcutaneous Tissue) exposed. There is no tunneling or undermining noted.  There is Nicole medium amount of serosanguineous drainage noted. The wound margin is distinct with the outline attached to the wound base. There is large (67-100%) pink, pale granulation within the wound bed. There is Nicole small (1-33%) amount of necrotic tissue within the wound bed. Assessment Active Problems ICD-10 Type 2 diabetes mellitus with foot ulcer Type 2 diabetes mellitus with hyperglycemia Type 2 diabetes mellitus with diabetic neuropathy, unspecified Patient presents with stability in the wound size and appearance. I removed some of the callus and debrided some of the nonviable tissue in the wound bed. I recommended continuing silver alginate and  offloading the area. I am not sure how much of this she is doing because there is significant callus buildup when I see her.. There were no signs of infection on exam. I will see her in 1 week Procedures Wound #2 Pre-procedure diagnosis of Wound #2 is Nicole Diabetic Wound/Ulcer of the Lower Extremity located on the Right T Fifth .Severity of Tissue Pre Debridement is: oe Fat layer exposed. There was Nicole Excisional Skin/Subcutaneous Tissue Debridement with Nicole total area of 0.09 sq cm performed by Kalman Shan, DO. With the following instrument(s): Curette to remove Non-Viable tissue/material. Material removed includes Callus, Subcutaneous Tissue, and Skin: Dermis after achieving pain control using Lidocaine 5% topical ointment. No specimens were taken. Nicole time out was conducted at 13:56, prior to the start of the procedure. Nicole Minimum amount of bleeding was controlled with Pressure. The procedure was tolerated well. Post Debridement Measurements: 0.3cm length x 0.3cm width x 0.2cm depth; 0.014cm^3 volume. Character of Wound/Ulcer Post Debridement is stable. Severity of Tissue Post Debridement is: Fat layer exposed. Post procedure Diagnosis Wound #2: Same as Pre-Procedure Plan Follow-up Appointments: Return Appointment in 1 week. - with Dr. Heber [place] Bathing/ Shower/ Hygiene: May shower and wash wound with soap and water. - with dressing changes only. Edema Control - Lymphedema / SCD / Other: Elevate legs to the level of the heart or above for 30 minutes daily and/or when sitting, Nicole frequency of: - throughout the day. Avoid standing for long periods of time. Moisturize legs daily. - both legs nightly. Off-Loading: Open toe surgical shoe to: - wear while walking and standing. Additional Orders / Instructions: Follow Nutritious Diet WOUND #2: - T Fifth Wound Laterality: Right oe Cleanser: Soap and Water 1 x Per YFV/49 Days Discharge Instructions: May shower and wash wound with dial antibacterial  soap and water prior to dressing change. Prim Dressing: KerraCel Ag Gelling Fiber Dressing, 2x2 in (silver alginate) (Generic) 1 x Per Day/15 Days ary Discharge Instructions: Apply silver alginate ***pack lightly*** into wound bed as instructed Secondary Dressing: Woven Gauze Sponges 2x2 in (Generic) 1 x Per Day/15 Days Discharge Instructions: Apply over primary dressing as directed. Secured With: 11M Medipore H Soft Cloth Surgical T ape, 2x2 (in/yd) (Generic) 1 x Per Day/15 Days Discharge Instructions: Secure dressing with tape as directed. 1. In office sharp debridement 2. continue silver alginate and offloading the area 3. Follow-up in 1 week Electronic Signature(s) Signed: 02/21/2021 3:19:30 PM By: Kalman Shan DO Entered By: Kalman Shan on 02/21/2021 15:18:53 -------------------------------------------------------------------------------- HxROS Details Patient Name: Date of Service: Nicole Golden, Nicole Burns J. 02/21/2021 1:30 PM Medical Record Number: [id number] Patient Account Number: [id number] Date of Birth/Sex: Treating RN: 11/11/1960 (60 y.o. Sue Lush Primary Care Provider: PA TEL, Darel Hong Other Clinician: Referring Provider: Treating Provider/Extender: Kalman Shan PA TEL, Ena Dawley in Treatment: 3 Information Obtained From Patient Eyes Medical History: Negative  for: Cataracts; Glaucoma; Optic Neuritis Past Medical History Notes: Hypertensive Retinopathy Ear/Nose/Mouth/Throat Medical History: Negative for: Chronic sinus problems/congestion; Middle ear problems Hematologic/Lymphatic Medical History: Negative for: Anemia; Hemophilia; Human Immunodeficiency Virus; Lymphedema; Sickle Cell Disease Respiratory Medical History: Negative for: Aspiration; Asthma; Chronic Obstructive Pulmonary Disease (COPD); Pneumothorax; Sleep Apnea; Tuberculosis Cardiovascular Medical History: Positive for: Hypertension Negative for: Angina; Arrhythmia; Congestive Heart  Failure; Coronary Artery Disease; Deep Vein Thrombosis; Hypotension; Myocardial Infarction; Peripheral Arterial Disease; Peripheral Venous Disease; Phlebitis Past Medical History Notes: Hyperlipdemia Gastrointestinal Medical History: Negative for: Cirrhosis ; Colitis; Crohns; Hepatitis Nicole; Hepatitis B; Hepatitis C Endocrine Medical History: Positive for: Type II Diabetes Negative for: Type I Diabetes Time with diabetes: 1984 Treated with: Insulin Blood sugar tested every day: Yes Tested : 3 times Nicole day Genitourinary Medical History: Negative for: End Stage Renal Disease Immunological Medical History: Negative for: Lupus Erythematosus; Raynauds; Scleroderma Integumentary (Skin) Medical History: Negative for: History of Burn Musculoskeletal Medical History: Negative for: Gout; Rheumatoid Arthritis; Osteoarthritis; Osteomyelitis Neurologic Medical History: Positive for: Neuropathy Negative for: Dementia; Quadriplegia; Seizure Disorder Oncologic Medical History: Negative for: Received Chemotherapy; Received Radiation Psychiatric Medical History: Negative for: Anorexia/bulimia; Confinement Anxiety Immunizations Pneumococcal Vaccine: Received Pneumococcal Vaccination: Yes Implantable Devices None Family and Social History Cancer: No; Diabetes: Yes - Father,Siblings,Child; Heart Disease: No; Hereditary Spherocytosis: No; Hypertension: Yes - Mother; Kidney Disease: No; Lung Disease: No; Seizures: No; Stroke: No; Thyroid Problems: No; Tuberculosis: No; Former smoker - quit 30 years ago; Marital Status - Widowed; Alcohol Use: Never; Drug Use: No History; Caffeine Use: Moderate - coffee, tea; Financial Concerns: No; Food, Clothing or Shelter Needs: No; Support System Lacking: No; Transportation Concerns: No Electronic Signature(s) Signed: 02/21/2021 3:19:30 PM By: Kalman Shan DO Signed: 02/21/2021 5:08:20 PM By: Lorrin Jackson Entered By: Kalman Shan on 02/21/2021  15:15:53 -------------------------------------------------------------------------------- SuperBill Details Patient Name: Date of Service: Nicole Golden, Nicole Golden 02/21/2021 Medical Record Number: [id number] Patient Account Number: [id number] Date of Birth/Sex: Treating RN: 08/13/1961 (60 y.o. Sue Lush Primary Care Provider: PA TEL, Darel Hong Other Clinician: Referring Provider: Treating Provider/Extender: Kalman Shan PA TEL, Ena Dawley in Treatment: 3 Diagnosis Coding ICD-10 Codes Code Description E11.621 Type 2 diabetes mellitus with foot ulcer E11.65 Type 2 diabetes mellitus with hyperglycemia E11.40 Type 2 diabetes mellitus with diabetic neuropathy, unspecified Facility Procedures CPT4 Code: [id number] Description: 81275 - DEB SUBQ TISSUE 20 SQ CM/< ICD-10 Diagnosis Description E11.621 Type 2 diabetes mellitus with foot ulcer Modifier: Quantity: 1 Physician Procedures Electronic Signature(s) Signed: 02/22/2021 3:16:41 PM By: Kalman Shan DO Previous Signature: 02/21/2021 3:19:30 PM Version By: Kalman Shan DO Entered By: Kalman Shan on 02/21/2021 15:23:40

## 2021-02-22 NOTE — Progress Notes (Addendum)
ENCARNACION, BOLE ([id number]) Visit Report for 02/21/2021 Arrival Information Details Patient Name: Date of Service: Nicole Golden 02/21/2021 1:30 PM Medical Record Number: [id number] Patient Account Number: [id number] Date of Birth/Sex: Treating RN: 1960-09-25 (60 y.o. Sue Lush Primary Care Benay Pomeroy: PA TEL, Darel Hong Other Clinician: Referring Amberley Hamler: Treating Myonna Chisom/Extender: Kalman Shan PA TEL, Ena Dawley in Treatment: 3 Visit Information History Since Last Visit Added or deleted any medications: No Patient Arrived: Ambulatory Any new allergies or adverse reactions: No Arrival Time: 13:40 Had a fall or experienced change in No Accompanied By: self activities of daily living that may affect Transfer Assistance: None risk of falls: Patient Identification Verified: Yes Signs or symptoms of abuse/neglect since last visito No Secondary Verification Process Completed: Yes Hospitalized since last visit: No Patient Requires Transmission-Based Precautions: No Implantable device outside of the clinic excluding No Patient Has Alerts: Yes cellular tissue based products placed in the center Patient Alerts: 08/2020 ABI R 0.91 since last visit: Has Dressing in Place as Prescribed: Yes Pain Present Now: No Electronic Signature(s) Signed: 02/21/2021 4:13:19 PM By: Sandre Kitty Entered By: Sandre Kitty on 02/21/2021 13:40:49 -------------------------------------------------------------------------------- Encounter Discharge Information Details Patient Name: Date of Service: Nicole Golden, Nicole Burns J. 02/21/2021 1:30 PM Medical Record Number: [id number] Patient Account Number: [id number] Date of Birth/Sex: Treating RN: [birth year] (60 y.o. [name] Primary Care Jazzmine Kleiman: PA TEL, Darel Hong Other Clinician: Referring Juron Vorhees: Treating Enid Maultsby/Extender: Kalman Shan PA TEL, Ena Dawley in Treatment: 3 Encounter Discharge Information Items Post Procedure  Vitals Discharge Condition: Stable Temperature (F): 98.4 Ambulatory Status: Ambulatory Pulse (bpm): 84 Discharge Destination: Home Respiratory Rate (breaths/min): 18 Transportation: Private Auto Blood Pressure (mmHg): 169/72 Accompanied By: alone Schedule Follow-up Appointment: Yes Clinical Summary of Care: Patient Declined Electronic Signature(s) Signed: 02/22/2021 5:39:04 PM By: Levan Hurst RN, BSN Entered By: Levan Hurst on 02/21/2021 14:37:59 -------------------------------------------------------------------------------- Lower Extremity Assessment Details Patient Name: Date of Service: Nicole Jester J. 02/21/2021 1:30 PM Medical Record Number: [id number] Patient Account Number: [id number] Date of Birth/Sex: Treating RN: 20-Jul-1961 (60 y.o. Sue Lush Primary Care Sidda Humm: PA TEL, Darel Hong Other Clinician: Referring Brady Plant: Treating Ronnell Clinger/Extender: Kalman Shan PA TEL, NILA Y Weeks in Treatment: 3 Edema Assessment Assessed: [Left: No] [Right: Yes] Edema: [Left: N] [Right: o] Calf Left: Right: Point of Measurement: 30 cm From Medial Instep 32 cm Ankle Left: Right: Point of Measurement: 11 cm From Medial Instep 22.4 cm Vascular Assessment Pulses: Dorsalis Pedis Palpable: [Right:Yes] Electronic Signature(s) Signed: 02/21/2021 5:08:20 PM By: Lorrin Jackson Entered By: Lorrin Jackson on 02/21/2021 13:50:57 -------------------------------------------------------------------------------- Multi Wound Chart Details Patient Name: Date of Service: Nicole Golden, Nicole Burns J. 02/21/2021 1:30 PM Medical Record Number: [id number] Patient Account Number: [id number] Date of Birth/Sex: Treating RN: [birth year] (60 y.o. Sue Lush Primary Care Kaleb Linquist: PA TEL, Darel Hong Other Clinician: Referring Avalon Coppinger: Treating Weslie Pretlow/Extender: Kalman Shan PA TEL, NILA Y Weeks in Treatment: 3 Vital Signs Height(in): 68 Pulse(bpm): 84 Weight(lbs): 200 Blood  Pressure(mmHg): 169/72 Body Mass Index(BMI): 30 Temperature(F): 98.4 Respiratory Rate(breaths/min): 18 Photos: [2:No Photos Right T Fifth oe] [N/A:N/A N/A] Wound Location: [2:Blister] [N/A:N/A] Wounding Event: [2:Diabetic Wound/Ulcer of the Lower] [N/A:N/A] Primary Etiology: [2:Extremity Hypertension, Type II Diabetes,] [N/A:N/A] Comorbid History: [2:Neuropathy 01/11/2021] [N/A:N/A] Date Acquired: [2:3] [N/A:N/A] Weeks of Treatment: [2:Open] [N/A:N/A] Wound Status: [2:0.3x0.3x0.2] [N/A:N/A] Measurements L x W x D (cm) [2:0.071] [N/A:N/A] A (cm) : rea [2:0.014] [N/A:N/A] Volume (cm) : [2:87.40%] [N/A:N/A] % Reduction in A rea: [2:93.80%] [N/A:N/A] %  Reduction in Volume: [2:Grade 2] [N/A:N/A] Classification: [2:Medium] [N/A:N/A] Exudate A mount: [2:Serosanguineous] [N/A:N/A] Exudate Type: [2:red, brown] [N/A:N/A] Exudate Color: [2:Distinct, outline attached] [N/A:N/A] Wound Margin: [2:Large (67-100%)] [N/A:N/A] Granulation A mount: [2:Pink, Pale] [N/A:N/A] Granulation Quality: [2:Small (1-33%)] [N/A:N/A] Necrotic A mount: [2:Fat Layer (Subcutaneous Tissue): Yes N/A] Exposed Structures: [2:Fascia: No Tendon: No Muscle: No Joint: No Bone: No Large (67-100%)] [N/A:N/A] Epithelialization: [2:Debridement - Excisional] [N/A:N/A] Debridement: Pre-procedure Verification/Time Out 13:56 [N/A:N/A] Taken: [2:Lidocaine 5% topical ointment] [N/A:N/A] Pain Control: [2:Callus, Subcutaneous] [N/A:N/A] Tissue Debrided: [2:Skin/Subcutaneous Tissue] [N/A:N/A] Level: [2:0.09] [N/A:N/A] Debridement A (sq cm): [2:rea Curette] [N/A:N/A] Instrument: [2:Minimum] [N/A:N/A] Bleeding: [2:Pressure] [N/A:N/A] Hemostasis A chieved: [2:Procedure was tolerated well] [N/A:N/A] Debridement Treatment Response: [2:0.3x0.3x0.2] [N/A:N/A] Post Debridement Measurements L x W x D (cm) [2:0.014] [N/A:N/A] Post Debridement Volume: (cm) [2:Debridement] [N/A:N/A] Treatment Notes Wound #2 (Toe Fifth) Wound  Laterality: Right Cleanser Soap and Water Discharge Instruction: May shower and wash wound with dial antibacterial soap and water prior to dressing change. Peri-Wound Care Topical Primary Dressing KerraCel Ag Gelling Fiber Dressing, 2x2 in (silver alginate) Discharge Instruction: Apply silver alginate ***pack lightly*** into wound bed as instructed Secondary Dressing Woven Gauze Sponges 2x2 in Discharge Instruction: Apply over primary dressing as directed. Secured With 60M [place] [facility] T ape, 2x2 (in/yd) Discharge Instruction: Secure dressing with tape as directed. Compression Wrap Compression Stockings Add-Ons Electronic Signature(s) Signed: 02/21/2021 3:19:30 PM By: Kalman Shan DO Signed: 02/21/2021 5:08:20 PM By: Lorrin Jackson Entered By: Kalman Shan on 02/21/2021 15:15:13 -------------------------------------------------------------------------------- Multi-Disciplinary Care Plan Details Patient Name: Date of Service: Nicole Golden, Nicole Burns J. 02/21/2021 1:30 PM Medical Record Number: [id number] Patient Account Number: [id number] Date of Birth/Sex: Treating RN: 02/02/1961 (60 y.o. Sue Lush Primary Care Keigan Girten: PA TEL, Darel Hong Other Clinician: Referring Christie Viscomi: Treating Chandrika Sandles/Extender: Kalman Shan PA TEL, Ena Dawley in Treatment: 3 Active Inactive Electronic Signature(s) Signed: 04/29/2021 11:55:40 AM By: Lorrin Jackson Signed: 05/26/2021 3:07:54 PM By: Baruch Gouty RN, BSN Previous Signature: 02/21/2021 1:45:41 PM Version By: Lorrin Jackson Entered By: Baruch Gouty on 04/25/2021 08:19:14 -------------------------------------------------------------------------------- Pain Assessment Details Patient Name: Date of Service: Nicole Golden, Nicole Burns J. 02/21/2021 1:30 PM Medical Record Number: [id number] Patient Account Number: [id number] Date of Birth/Sex: Treating RN: 08/09/1961 (60 y.o. Sue Lush Primary Care Karisma Meiser: PA  TEL, Darel Hong Other Clinician: Referring [place] Lesure: Treating Marquette Blodgett/Extender: Kalman Shan PA TEL, Ena Dawley in Treatment: 3 Active Problems Location of Pain Severity and Description of Pain Patient Has Paino No Site Locations Pain Management and Medication Current Pain Management: Electronic Signature(s) Signed: 02/21/2021 4:13:19 PM By: Sandre Kitty Signed: 02/21/2021 5:08:20 PM By: Lorrin Jackson Entered By: Sandre Kitty on 02/21/2021 13:41:19 -------------------------------------------------------------------------------- Patient/Caregiver Education Details Patient Name: Date of Service: Nicole Golden 6/27/2022andnbsp1:30 PM Medical Record Number: [id number] Patient Account Number: [id number] Date of Birth/Gender: Treating RN: 22-May-1961 (60 y.o. Sue Lush Primary Care Physician: PA TEL, Darel Hong Other Clinician: Referring Physician: Treating Physician/Extender: Kalman Shan PA TEL, Ena Dawley in Treatment: 3 Education Assessment Education Provided To: Patient Education Topics Provided Offloading: Methods: Explain/Verbal, Printed Responses: State content correctly Wound/Skin Impairment: Methods: Explain/Verbal, Printed Responses: State content correctly Electronic Signature(s) Signed: 02/21/2021 5:08:20 PM By: Lorrin Jackson Entered By: Lorrin Jackson on 02/21/2021 13:46:13 -------------------------------------------------------------------------------- Wound Assessment Details Patient Name: Date of Service: Nicole Jester J. 02/21/2021 1:30 PM Medical Record Number: [id number] Patient Account Number: [id number] Date of Birth/Sex: Treating RN: 07/20/1961 (60 y.o. Sue Lush Primary Care Canaan Holzer:  PA TEL, NILA Y Other Clinician: Referring Zoraida Havrilla: Treating Natascha Edmonds/Extender: Kalman Shan PA TEL, NILA Y Weeks in Treatment: 3 Wound Status Wound Number: 2 Primary Etiology: Diabetic Wound/Ulcer of the Lower  Extremity Wound Location: Right T Fifth oe Wound Status: Open Wounding Event: Blister Comorbid History: Hypertension, Type II Diabetes, Neuropathy Date Acquired: 01/11/2021 Weeks Of Treatment: 3 Clustered Wound: No Photos Wound Measurements Length: (cm) 0.3 Width: (cm) 0.3 Depth: (cm) 0.2 Area: (cm) 0.071 Volume: (cm) 0.014 % Reduction in Area: 87.4% % Reduction in Volume: 93.8% Epithelialization: Large (67-100%) Tunneling: No Undermining: No Wound Description Classification: Grade 2 Wound Margin: Distinct, outline attached Exudate Amount: Medium Exudate Type: Serosanguineous Exudate Color: red, brown Foul Odor After Cleansing: No Slough/Fibrino Yes Wound Bed Granulation Amount: Large (67-100%) Exposed Structure Granulation Quality: Pink, Pale Fascia Exposed: No Necrotic Amount: Small (1-33%) Fat Layer (Subcutaneous Tissue) Exposed: Yes Tendon Exposed: No Muscle Exposed: No Joint Exposed: No Bone Exposed: No Electronic Signature(s) Signed: 02/21/2021 4:13:19 PM By: Sandre Kitty Signed: 02/21/2021 5:08:20 PM By: Lorrin Jackson Entered By: Sandre Kitty on 02/21/2021 15:55:52 -------------------------------------------------------------------------------- Vitals Details Patient Name: Date of Service: Nicole Golden, Nicole Burns J. 02/21/2021 1:30 PM Medical Record Number: [id number] Patient Account Number: [id number] Date of Birth/Sex: Treating RN: [birth year] (60 y.o. Sue Lush Primary Care Mckala Pantaleon: PA TEL, Darel Hong Other Clinician: Referring Orlie Cundari: Treating Roxie Kreeger/Extender: Kalman Shan PA TEL, NILA Y Weeks in Treatment: 3 Vital Signs Time Taken: 13:40 Temperature (F): 98.4 Height (in): 68 Pulse (bpm): 84 Weight (lbs): 200 Respiratory Rate (breaths/min): 18 Body Mass Index (BMI): 30.4 Blood Pressure (mmHg): 169/72 Reference Range: 80 - 120 mg / dl Electronic Signature(s) Signed: 02/21/2021 4:13:19 PM By: Sandre Kitty Entered By:  Sandre Kitty on 02/21/2021 13:41:09

## 2021-02-26 ENCOUNTER — Encounter (HOSPITAL_COMMUNITY): Payer: Self-pay | Admitting: *Deleted

## 2021-02-26 ENCOUNTER — Other Ambulatory Visit: Payer: Self-pay

## 2021-02-26 ENCOUNTER — Emergency Department (HOSPITAL_COMMUNITY)
Admission: EM | Admit: 2021-02-26 | Discharge: 2021-02-27 | Disposition: A | Discharge status: Home / Self Care | Payer: Medicare HMO | Source: Home / Self Care | Attending: Emergency Medicine | Admitting: Emergency Medicine

## 2021-02-26 DIAGNOSIS — Z79899 Other long term (current) drug therapy: Secondary | ICD-10-CM | POA: Insufficient documentation

## 2021-02-26 DIAGNOSIS — E1165 Type 2 diabetes mellitus with hyperglycemia: Secondary | ICD-10-CM | POA: Insufficient documentation

## 2021-02-26 DIAGNOSIS — R739 Hyperglycemia, unspecified: Secondary | ICD-10-CM

## 2021-02-26 DIAGNOSIS — Z87891 Personal history of nicotine dependence: Secondary | ICD-10-CM | POA: Insufficient documentation

## 2021-02-26 DIAGNOSIS — E1140 Type 2 diabetes mellitus with diabetic neuropathy, unspecified: Secondary | ICD-10-CM | POA: Insufficient documentation

## 2021-02-26 DIAGNOSIS — E11319 Type 2 diabetes mellitus with unspecified diabetic retinopathy without macular edema: Secondary | ICD-10-CM | POA: Insufficient documentation

## 2021-02-26 DIAGNOSIS — Z794 Long term (current) use of insulin: Secondary | ICD-10-CM | POA: Insufficient documentation

## 2021-02-26 DIAGNOSIS — Z7982 Long term (current) use of aspirin: Secondary | ICD-10-CM | POA: Insufficient documentation

## 2021-02-26 DIAGNOSIS — A419 Sepsis, unspecified organism: Secondary | ICD-10-CM | POA: Diagnosis not present

## 2021-02-26 DIAGNOSIS — E1136 Type 2 diabetes mellitus with diabetic cataract: Secondary | ICD-10-CM | POA: Insufficient documentation

## 2021-02-26 DIAGNOSIS — R112 Nausea with vomiting, unspecified: Secondary | ICD-10-CM | POA: Diagnosis not present

## 2021-02-26 DIAGNOSIS — R1111 Vomiting without nausea: Secondary | ICD-10-CM

## 2021-02-26 LAB — COMPREHENSIVE METABOLIC PANEL
ALT: 25 U/L (ref 0–44)
AST: 24 U/L (ref 15–41)
Albumin: 3.4 g/dL — ABNORMAL LOW (ref 3.5–5.0)
Alkaline Phosphatase: 82 U/L (ref 38–126)
Anion gap: 15 (ref 5–15)
BUN: 40 mg/dL — ABNORMAL HIGH (ref 8–23)
CO2: 20 mmol/L — ABNORMAL LOW (ref 22–32)
Calcium: 9 mg/dL (ref 8.9–10.3)
Chloride: 94 mmol/L — ABNORMAL LOW (ref 98–111)
Creatinine, Ser: 1.68 mg/dL — ABNORMAL HIGH (ref 0.44–1.00)
GFR, Estimated: 34 mL/min — ABNORMAL LOW (ref >60)
Glucose, Bld: 603 mg/dL (ref 70–99)
Potassium: 4.5 mmol/L (ref 3.5–5.1)
Sodium: 129 mmol/L — ABNORMAL LOW (ref 135–145)
Total Bilirubin: 1.1 mg/dL (ref 0.3–1.2)
Total Protein: 7.8 g/dL (ref 6.5–8.1)

## 2021-02-26 LAB — CBC
HCT: 35.3 % — ABNORMAL LOW (ref 36.0–46.0)
Hemoglobin: 11.2 g/dL — ABNORMAL LOW (ref 12.0–15.0)
MCH: 25.7 pg — ABNORMAL LOW (ref 26.0–34.0)
MCHC: 31.7 g/dL (ref 30.0–36.0)
MCV: 81.1 fL (ref 80.0–100.0)
Platelets: 240 10*3/uL (ref 150–400)
RBC: 4.35 MIL/uL (ref 3.87–5.11)
RDW: 13.4 % (ref 11.5–15.5)
WBC: 11.2 10*3/uL — ABNORMAL HIGH (ref 4.0–10.5)
nRBC: 0 % (ref 0.0–0.2)

## 2021-02-26 LAB — CBG MONITORING, ED: Glucose-Capillary: 497 mg/dL — ABNORMAL HIGH (ref 70–99)

## 2021-02-26 LAB — LIPASE, BLOOD: Lipase: 20 U/L (ref 11–51)

## 2021-02-26 MED ORDER — ONDANSETRON HCL 4 MG/2ML IJ SOLN
4.0000 mg | Freq: Once | INTRAMUSCULAR | Status: AC
  Administered 2021-02-26: 4 mg via INTRAVENOUS
  Filled 2021-02-26: qty 2

## 2021-02-26 MED ORDER — SODIUM CHLORIDE 0.9 % IV BOLUS
1000.0000 mL | Freq: Once | INTRAVENOUS | Status: AC
  Administered 2021-02-26: 1000 mL via INTRAVENOUS

## 2021-02-26 MED ORDER — INSULIN ASPART 100 UNIT/ML IV SOLN
7.0000 [IU] | Freq: Once | INTRAVENOUS | Status: AC
  Administered 2021-02-26: 7 [IU] via INTRAVENOUS

## 2021-02-26 NOTE — ED Provider Notes (Signed)
[facility] EMERGENCY DEPARTMENT Provider Note   CSN: [id number] Arrival date & time: 02/26/21  1813     History Chief Complaint  Patient presents with  . Emesis    Nicole Golden is a 60 y.o. female.  Patient presents chief complaint of vomiting ongoing for 3 days.  She states that she has been feeling nauseous ever since she got her COVID-vaccine about 3 days ago.  She denies fevers or cough denies shortness of breath or diarrhea but has had persistent vomiting for the past 3 days nonbloody nonbilious.  She states that she has not taken her insulin as she has been vomiting.  She has not checked her blood sugars at home either, but has been drinking ginger ale for the last several days to try to help with her vomiting.      Past Medical History:  Diagnosis Date  . Diabetes mellitus without complication ([place])   . Diabetic neuropathy ([place])   . Diabetic neuropathy ([place])   . Hypertension   . Hypertensive retinopathy    OU    Patient Active Problem List   Diagnosis Date Noted  . Constipation 05/08/2018  . DKA (diabetic ketoacidosis) ([place]) 04/24/2016  . Abdominal wall cellulitis 04/24/2016  . Type II diabetes mellitus with complication, uncontrolled ([place]) 04/24/2016    Past Surgical History:  Procedure Laterality Date  . CATARACT EXTRACTION Bilateral   . EYE SURGERY Bilateral    Cat Sx  . FOOT SURGERY     right foot  . TUBAL LIGATION       OB History   No obstetric history on file.     Family History  Problem Relation Age of Onset  . Breast cancer Paternal Aunt   . Diabetes Father   . Colon cancer Neg Hx   . Colon polyps Neg Hx     Social History   Tobacco Use  . Smoking status: Former    Pack years: 0.00  . Smokeless tobacco: Never  Vaping Use  . Vaping Use: Never used  Substance Use Topics  . Alcohol use: Yes    Comment: occas  . Drug use: Never    Home Medications Prior to Admission medications   Medication Sig Start Date End Date Taking?  Authorizing Provider  ACCU-CHEK AVIVA PLUS test strip  04/15/20   [provider]  acetaminophen (TYLENOL) 500 MG tablet Take 500 mg by mouth every 6 (six) hours as needed for fever. Patient not taking: Reported on 09/29/2020    [provider]  amLODipine (NORVASC) 2.5 MG tablet Take 2.5 mg by mouth daily.    [provider]  aspirin EC 81 MG tablet Take 81 mg by mouth daily. Swallow whole.    [provider]  atropine 1 % ophthalmic solution Place 1 drop into the left eye 2 (two) times daily. 08/06/20   [provider]  dorzolamide-timolol (COSOPT) 22.3-6.8 MG/ML ophthalmic solution Place 1 drop into the left eye 2 (two) times daily. 08/06/20   [provider]  furosemide (LASIX) 20 MG tablet Take 20 mg by mouth daily as needed for edema. 03/04/20   [provider]  Insulin Pen Needle (B-D ULTRAFINE III SHORT PEN) 31G X 8 MM MISC 1 each by Does not apply route as directed. 08/26/20   Cassandria Anger, MD  LANTUS SOLOSTAR 100 UNIT/ML Solostar Pen Inject 60 Units into the skin at bedtime. 03/04/20   [provider]  latanoprost (XALATAN) 0.005 % ophthalmic solution 1  drop daily. 08/10/20   [provider]  losartan-hydrochlorothiazide (HYZAAR) 100-12.5 MG tablet Take 1 tablet by mouth daily.    [provider]  NOVOLOG FLEXPEN 100 UNIT/ML FlexPen Inject 10-16 Units into the skin 3 (three) times daily before meals. 03/04/20   [provider]  prednisoLONE acetate (PRED FORTE) 1 % ophthalmic suspension Place 1 drop into the left eye 3 (three) times daily. 08/06/20   [provider]  rosuvastatin (CRESTOR) 40 MG tablet Take 40 mg by mouth daily.    [provider]  SANTYL ointment  05/07/20   [provider]    Allergies    Patient has no known allergies.  Review of Systems   Review of Systems  Constitutional:  Negative for fever.  HENT:  Negative for ear pain.   Eyes:   Negative for pain.  Respiratory:  Negative for cough.   Cardiovascular:  Negative for chest pain.  Gastrointestinal:  Negative for abdominal pain.  Genitourinary:  Negative for flank pain.  Musculoskeletal:  Negative for back pain.  Skin:  Negative for rash.  Neurological:  Negative for headaches.   Physical Exam Updated Vital Signs BP (!) 158/59   Pulse (!) 106   Temp 98.9 F (37.2 C) (Oral)   Resp (!) 22   SpO2 97%   Physical Exam Constitutional:      General: She is not in acute distress.    Appearance: Normal appearance.  HENT:     Head: Normocephalic.     Nose: Nose normal.  Eyes:     Extraocular Movements: Extraocular movements intact.  Cardiovascular:     Rate and Rhythm: Normal rate.  Pulmonary:     Effort: Pulmonary effort is normal.  Musculoskeletal:        General: Normal range of motion.     Cervical back: Normal range of motion.  Neurological:     General: No focal deficit present.     Mental Status: She is alert. Mental status is at baseline.    ED Results / Procedures / Treatments   Labs (all labs ordered are listed, but only abnormal results are displayed) Labs Reviewed  COMPREHENSIVE METABOLIC PANEL - Abnormal; Notable for the following components:      Result Value   Sodium 129 (*)    Chloride 94 (*)    CO2 20 (*)    Glucose, Bld 603 (*)    BUN 40 (*)    Creatinine, Ser 1.68 (*)    Albumin 3.4 (*)    GFR, Estimated 34 (*)    All other components within normal limits  CBC - Abnormal; Notable for the following components:   WBC 11.2 (*)    Hemoglobin 11.2 (*)    HCT 35.3 (*)    MCH 25.7 (*)    All other components within normal limits  CBG MONITORING, ED - Abnormal; Notable for the following components:   Glucose-Capillary 497 (*)    All other components within normal limits  LIPASE, BLOOD  URINALYSIS, ROUTINE W REFLEX MICROSCOPIC  CBG MONITORING, ED    EKG EKG Interpretation  Date/Time:  Saturday February 26 2021 21:45:31  EDT Ventricular Rate:  109 PR Interval:  141 QRS Duration: 97 QT Interval:  337 QTC Calculation: 454 R Axis:   41 Text Interpretation: Sinus tachycardia Probable left atrial enlargement Baseline wander in lead(s) V1 Confirmed by Thamas Jaegers ([id number]) on 02/26/2021 10:15:30 PM  Radiology No results found.  Procedures .Critical Care  Date/Time: 02/26/2021 11:12  PM Performed by: Luna Fuse, MD Authorized by: Luna Fuse, MD   Critical care provider statement:    Critical care time (minutes):  30   Critical care time was exclusive of:  Separately billable procedures and treating other patients   Critical care was necessary to treat or prevent imminent or life-threatening deterioration of the following conditions:  Metabolic crisis Comments:     Severe hyperglycemia with elevated anion gap requiring IV insulin.   Medications Ordered in ED Medications  ondansetron (ZOFRAN) injection 4 mg (4 mg Intravenous Given 02/26/21 2134)  sodium chloride 0.9 % bolus 1,000 mL (1,000 mLs Intravenous New Bag/Given 02/26/21 2135)  sodium chloride 0.9 % bolus 1,000 mL (1,000 mLs Intravenous New Bag/Given 02/26/21 2135)  insulin aspart (novoLOG) injection 7 Units (7 Units Intravenous Given 02/26/21 2135)  insulin aspart (novoLOG) injection 7 Units (7 Units Intravenous Given 02/26/21 2317)    ED Course  I have reviewed the triage vital signs and the nursing notes.  Pertinent labs & imaging results that were available during my care of the patient were reviewed by me and considered in my medical decision making (see chart for details).  Clinical Course as of 05/16/21 1636  Mon May 16, 2021  1634 Anion gap: 15 [JH]    Clinical Course User Index [JH] Almyra Free Greggory Brandy, MD   MDM Rules/Calculators/A&P                          Labs show glucose of 600.  However bicarb of 20 and a gap of 15.  I do not feel she is in DKA.  She was given 2 L bolus of fluids.  7 units of insulin IV.  Subsequent blood sugar is  somewhat improved at 490.  However unbeknownst to me patient had a bottle of ginger ale at her bedside which has subsequently been removed.  Additional insulin given in repeat blood sugars ordered.   Final Clinical Impression(s) / ED Diagnoses Final diagnoses:  Vomiting without nausea, intractability of vomiting not specified, unspecified vomiting type  Hyperglycemia    Rx / DC Orders ED Discharge Orders     None        Luna Fuse, MD 02/26/21 2338

## 2021-02-26 NOTE — ED Triage Notes (Signed)
States she took a covid booster 3 days ago and now is unable to eat, states she is weak

## 2021-02-27 LAB — URINALYSIS, ROUTINE W REFLEX MICROSCOPIC
Bilirubin Urine: NEGATIVE
Glucose, UA: >=500 mg/dL — AB
Ketones, ur: 20 mg/dL — AB
Leukocytes,Ua: NEGATIVE
Nitrite: NEGATIVE
Protein, ur: 100 mg/dL — AB
Specific Gravity, Urine: 1.015 (ref 1.005–1.030)
pH: 6 (ref 5.0–8.0)

## 2021-02-27 LAB — CBG MONITORING, ED
Glucose-Capillary: 395 mg/dL — ABNORMAL HIGH (ref 70–99)
Glucose-Capillary: 408 mg/dL — ABNORMAL HIGH (ref 70–99)
Glucose-Capillary: 414 mg/dL — ABNORMAL HIGH (ref 70–99)

## 2021-02-27 MED ORDER — ONDANSETRON 4 MG PO TBDP: 4.0000 mg | ORAL_TABLET | Freq: Three times a day (TID) | ORAL | 0 refills | Status: DC | PRN

## 2021-02-27 NOTE — ED Notes (Signed)
Pt has drank 2 cups of water, 2 additional cups of water given to pt.

## 2021-02-27 NOTE — Discharge Instructions (Addendum)
You are seen today for nausea and vomiting.  Your blood sugar was elevated.  Make sure that you are drinking plenty of fluids and limiting your sugar.  You will be discharged with Zofran.

## 2021-02-27 NOTE — ED Provider Notes (Signed)
  Patient signed out pending recheck of blood sugar and p.o. challenge.  In brief, presented with 3 days of vomiting.  Reported she started vomiting after receiving her COVID vaccination 3 days ago.  She has not been taking her insulin at home or checking her blood sugars.  Noted to be hyperglycemic with a glucose greater than 600.  Not in DKA.  Patient received 2 L of fluid and 2 doses of insulin.  She is no longer vomiting.  She is tolerating p.o.  Most recent blood sugar 408.  On my repeat evaluation, she is awake, alert, no acute distress.  She is tolerating fluids.  Still remains slightly tachycardic.  This is likely related to dehydration previous vomiting.  She appears clinically well enough to be discharged.  Recommend that she monitor her blood sugars closely and reinitiate her insulin with small meals.  Will discharge with Zofran  Physical Exam  BP (!) 168/70   Pulse (!) 114   Temp 98.9 F (37.2 C) (Oral)   Resp (!) 29   SpO2 95%   Problem List Items Addressed This Visit   None Visit Diagnoses     Vomiting without nausea, intractability of vomiting not specified, unspecified vomiting type    -  Primary   Hyperglycemia               Dina Rich, Barbette Hair, MD 02/27/21 [id number]

## 2021-02-28 ENCOUNTER — Encounter (HOSPITAL_COMMUNITY): Payer: Self-pay | Admitting: *Deleted

## 2021-02-28 ENCOUNTER — Other Ambulatory Visit: Payer: Self-pay

## 2021-02-28 ENCOUNTER — Inpatient Hospital Stay (HOSPITAL_COMMUNITY)
Admission: EM | Admit: 2021-02-28 | Discharge: 2021-03-08 | DRG: 853 | Disposition: A | Discharge status: Home Health | Payer: Medicare HMO | Attending: Internal Medicine | Admitting: Internal Medicine

## 2021-02-28 ENCOUNTER — Emergency Department (HOSPITAL_COMMUNITY): Payer: Medicare HMO

## 2021-02-28 DIAGNOSIS — D631 Anemia in chronic kidney disease: Secondary | ICD-10-CM | POA: Diagnosis present

## 2021-02-28 DIAGNOSIS — L03119 Cellulitis of unspecified part of limb: Secondary | ICD-10-CM | POA: Diagnosis not present

## 2021-02-28 DIAGNOSIS — Z20822 Contact with and (suspected) exposure to covid-19: Secondary | ICD-10-CM | POA: Diagnosis present

## 2021-02-28 DIAGNOSIS — I10 Essential (primary) hypertension: Secondary | ICD-10-CM | POA: Diagnosis not present

## 2021-02-28 DIAGNOSIS — F411 Generalized anxiety disorder: Secondary | ICD-10-CM | POA: Diagnosis present

## 2021-02-28 DIAGNOSIS — E1165 Type 2 diabetes mellitus with hyperglycemia: Secondary | ICD-10-CM | POA: Diagnosis present

## 2021-02-28 DIAGNOSIS — Z9842 Cataract extraction status, left eye: Secondary | ICD-10-CM

## 2021-02-28 DIAGNOSIS — R112 Nausea with vomiting, unspecified: Secondary | ICD-10-CM | POA: Diagnosis present

## 2021-02-28 DIAGNOSIS — E118 Type 2 diabetes mellitus with unspecified complications: Secondary | ICD-10-CM | POA: Diagnosis not present

## 2021-02-28 DIAGNOSIS — E1169 Type 2 diabetes mellitus with other specified complication: Secondary | ICD-10-CM | POA: Diagnosis present

## 2021-02-28 DIAGNOSIS — Z833 Family history of diabetes mellitus: Secondary | ICD-10-CM

## 2021-02-28 DIAGNOSIS — N1832 Chronic kidney disease, stage 3b: Secondary | ICD-10-CM | POA: Diagnosis present

## 2021-02-28 DIAGNOSIS — I129 Hypertensive chronic kidney disease with stage 1 through stage 4 chronic kidney disease, or unspecified chronic kidney disease: Secondary | ICD-10-CM | POA: Diagnosis present

## 2021-02-28 DIAGNOSIS — I70261 Atherosclerosis of native arteries of extremities with gangrene, right leg: Secondary | ICD-10-CM | POA: Diagnosis present

## 2021-02-28 DIAGNOSIS — A419 Sepsis, unspecified organism: Secondary | ICD-10-CM | POA: Diagnosis present

## 2021-02-28 DIAGNOSIS — E1152 Type 2 diabetes mellitus with diabetic peripheral angiopathy with gangrene: Secondary | ICD-10-CM | POA: Diagnosis present

## 2021-02-28 DIAGNOSIS — E1122 Type 2 diabetes mellitus with diabetic chronic kidney disease: Secondary | ICD-10-CM | POA: Diagnosis present

## 2021-02-28 DIAGNOSIS — M86171 Other acute osteomyelitis, right ankle and foot: Secondary | ICD-10-CM | POA: Diagnosis present

## 2021-02-28 DIAGNOSIS — E1142 Type 2 diabetes mellitus with diabetic polyneuropathy: Secondary | ICD-10-CM | POA: Diagnosis present

## 2021-02-28 DIAGNOSIS — N179 Acute kidney failure, unspecified: Secondary | ICD-10-CM | POA: Diagnosis present

## 2021-02-28 DIAGNOSIS — Z87891 Personal history of nicotine dependence: Secondary | ICD-10-CM

## 2021-02-28 DIAGNOSIS — Z79899 Other long term (current) drug therapy: Secondary | ICD-10-CM

## 2021-02-28 DIAGNOSIS — E785 Hyperlipidemia, unspecified: Secondary | ICD-10-CM | POA: Diagnosis present

## 2021-02-28 DIAGNOSIS — E11628 Type 2 diabetes mellitus with other skin complications: Secondary | ICD-10-CM | POA: Diagnosis present

## 2021-02-28 DIAGNOSIS — L03115 Cellulitis of right lower limb: Secondary | ICD-10-CM | POA: Diagnosis present

## 2021-02-28 DIAGNOSIS — Z794 Long term (current) use of insulin: Secondary | ICD-10-CM

## 2021-02-28 DIAGNOSIS — L02611 Cutaneous abscess of right foot: Secondary | ICD-10-CM | POA: Diagnosis present

## 2021-02-28 DIAGNOSIS — K8020 Calculus of gallbladder without cholecystitis without obstruction: Secondary | ICD-10-CM | POA: Diagnosis present

## 2021-02-28 DIAGNOSIS — Z803 Family history of malignant neoplasm of breast: Secondary | ICD-10-CM | POA: Diagnosis not present

## 2021-02-28 DIAGNOSIS — R6520 Severe sepsis without septic shock: Secondary | ICD-10-CM | POA: Diagnosis present

## 2021-02-28 DIAGNOSIS — K5900 Constipation, unspecified: Secondary | ICD-10-CM | POA: Diagnosis present

## 2021-02-28 DIAGNOSIS — R609 Edema, unspecified: Secondary | ICD-10-CM

## 2021-02-28 DIAGNOSIS — I96 Gangrene, not elsewhere classified: Secondary | ICD-10-CM

## 2021-02-28 DIAGNOSIS — K8500 Idiopathic acute pancreatitis without necrosis or infection: Secondary | ICD-10-CM | POA: Diagnosis present

## 2021-02-28 DIAGNOSIS — Z9841 Cataract extraction status, right eye: Secondary | ICD-10-CM

## 2021-02-28 DIAGNOSIS — Z7982 Long term (current) use of aspirin: Secondary | ICD-10-CM

## 2021-02-28 DIAGNOSIS — IMO0002 Reserved for concepts with insufficient information to code with codable children: Secondary | ICD-10-CM | POA: Diagnosis present

## 2021-02-28 DIAGNOSIS — L03031 Cellulitis of right toe: Secondary | ICD-10-CM | POA: Diagnosis present

## 2021-02-28 LAB — CBC WITH DIFFERENTIAL/PLATELET
Abs Immature Granulocytes: 0.13 10*3/uL — ABNORMAL HIGH (ref 0.00–0.07)
Basophils Absolute: 0 10*3/uL (ref 0.0–0.1)
Basophils Relative: 0 %
Eosinophils Absolute: 0 10*3/uL (ref 0.0–0.5)
Eosinophils Relative: 0 %
HCT: 34.6 % — ABNORMAL LOW (ref 36.0–46.0)
Hemoglobin: 10.9 g/dL — ABNORMAL LOW (ref 12.0–15.0)
Immature Granulocytes: 1 %
Lymphocytes Relative: 9 %
Lymphs Abs: 1.1 10*3/uL (ref 0.7–4.0)
MCH: 25.6 pg — ABNORMAL LOW (ref 26.0–34.0)
MCHC: 31.5 g/dL (ref 30.0–36.0)
MCV: 81.4 fL (ref 80.0–100.0)
Monocytes Absolute: 0.9 10*3/uL (ref 0.1–1.0)
Monocytes Relative: 7 %
Neutro Abs: 10.1 10*3/uL — ABNORMAL HIGH (ref 1.7–7.7)
Neutrophils Relative %: 83 %
Platelets: 242 10*3/uL (ref 150–400)
RBC: 4.25 MIL/uL (ref 3.87–5.11)
RDW: 13.9 % (ref 11.5–15.5)
WBC: 11.9 10*3/uL — ABNORMAL HIGH (ref 4.0–10.5)
nRBC: 0 % (ref 0.0–0.2)

## 2021-02-28 LAB — HIV ANTIBODY (ROUTINE TESTING W REFLEX): HIV Screen 4th Generation wRfx: NONREACTIVE

## 2021-02-28 LAB — BASIC METABOLIC PANEL
Anion gap: 10 (ref 5–15)
BUN: 38 mg/dL — ABNORMAL HIGH (ref 8–23)
CO2: 20 mmol/L — ABNORMAL LOW (ref 22–32)
Calcium: 8.5 mg/dL — ABNORMAL LOW (ref 8.9–10.3)
Chloride: 104 mmol/L (ref 98–111)
Creatinine, Ser: 1.66 mg/dL — ABNORMAL HIGH (ref 0.44–1.00)
GFR, Estimated: 35 mL/min — ABNORMAL LOW (ref >60)
Glucose, Bld: 400 mg/dL — ABNORMAL HIGH (ref 70–99)
Potassium: 3.7 mmol/L (ref 3.5–5.1)
Sodium: 134 mmol/L — ABNORMAL LOW (ref 135–145)

## 2021-02-28 LAB — COMPREHENSIVE METABOLIC PANEL
ALT: 38 U/L (ref 0–44)
AST: 39 U/L (ref 15–41)
Albumin: 3 g/dL — ABNORMAL LOW (ref 3.5–5.0)
Alkaline Phosphatase: 88 U/L (ref 38–126)
Anion gap: 13 (ref 5–15)
BUN: 40 mg/dL — ABNORMAL HIGH (ref 8–23)
CO2: 19 mmol/L — ABNORMAL LOW (ref 22–32)
Calcium: 8.8 mg/dL — ABNORMAL LOW (ref 8.9–10.3)
Chloride: 101 mmol/L (ref 98–111)
Creatinine, Ser: 1.82 mg/dL — ABNORMAL HIGH (ref 0.44–1.00)
GFR, Estimated: 31 mL/min — ABNORMAL LOW (ref >60)
Glucose, Bld: 441 mg/dL — ABNORMAL HIGH (ref 70–99)
Potassium: 4 mmol/L (ref 3.5–5.1)
Sodium: 133 mmol/L — ABNORMAL LOW (ref 135–145)
Total Bilirubin: 1 mg/dL (ref 0.3–1.2)
Total Protein: 7.2 g/dL (ref 6.5–8.1)

## 2021-02-28 LAB — CBG MONITORING, ED
Glucose-Capillary: 386 mg/dL — ABNORMAL HIGH (ref 70–99)
Glucose-Capillary: 329 mg/dL — ABNORMAL HIGH (ref 70–99)

## 2021-02-28 LAB — LIPASE, BLOOD: Lipase: 73 U/L — ABNORMAL HIGH (ref 11–51)

## 2021-02-28 LAB — ETHANOL: Alcohol, Ethyl (B): <10 mg/dL (ref <10)

## 2021-02-28 IMAGING — DX DG ABDOMEN ACUTE W/ 1V CHEST
4 series · 4 of 4 positions shown · non-contrast
Comparison: None.

CLINICAL DATA: Vomiting.

EXAM:
DG ABDOMEN ACUTE WITH 1 VIEW CHEST

[abdomen erect]
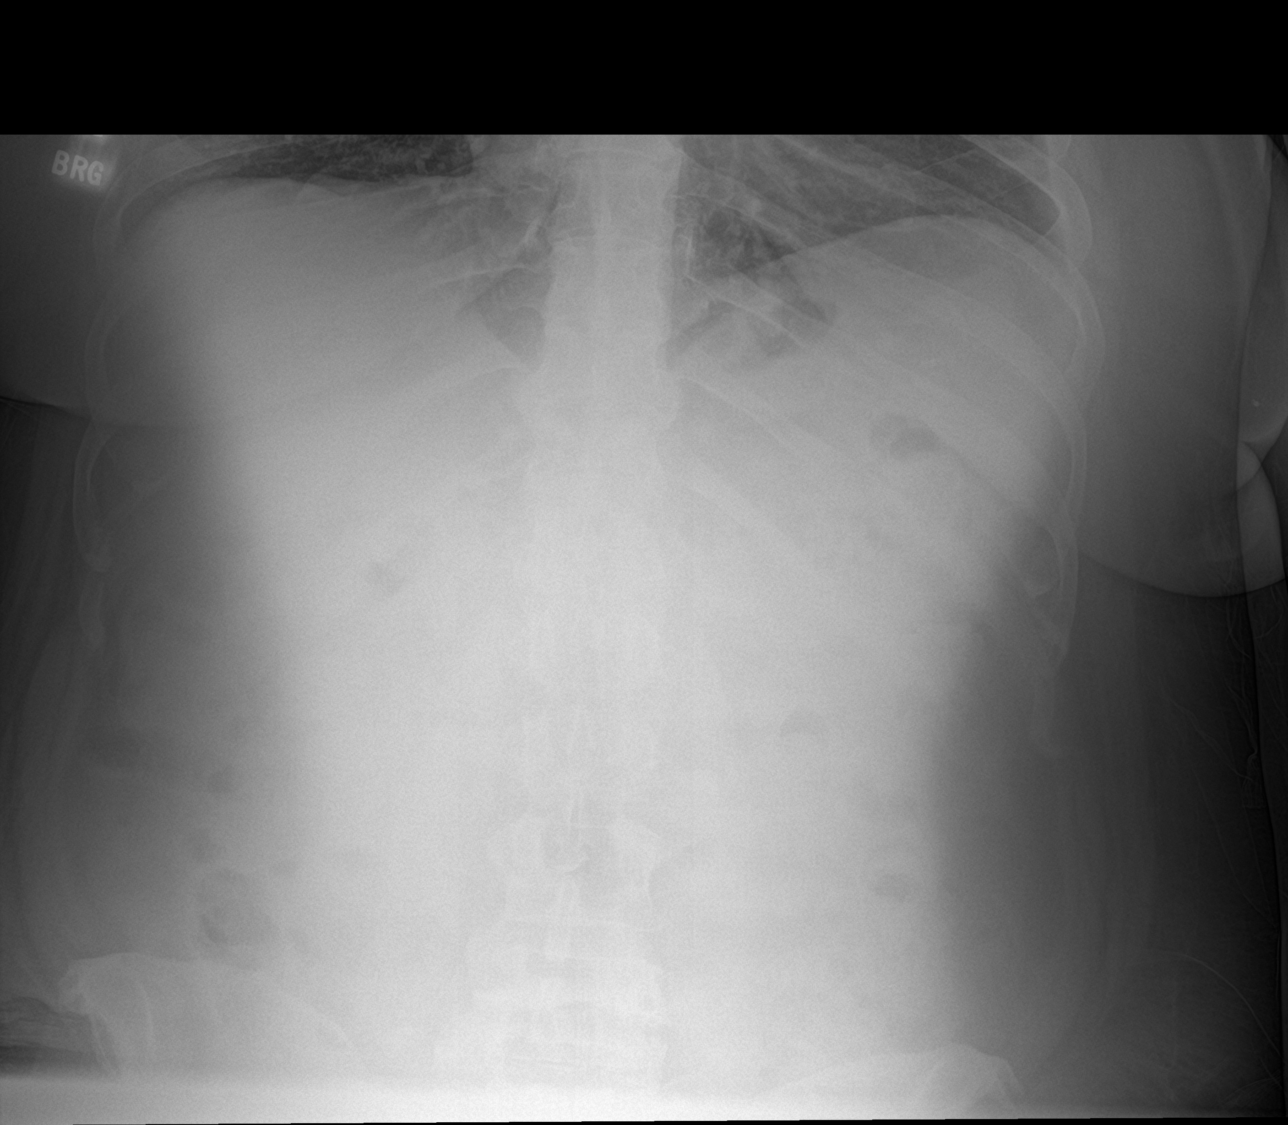

[abdomen supine (1 of 2)]
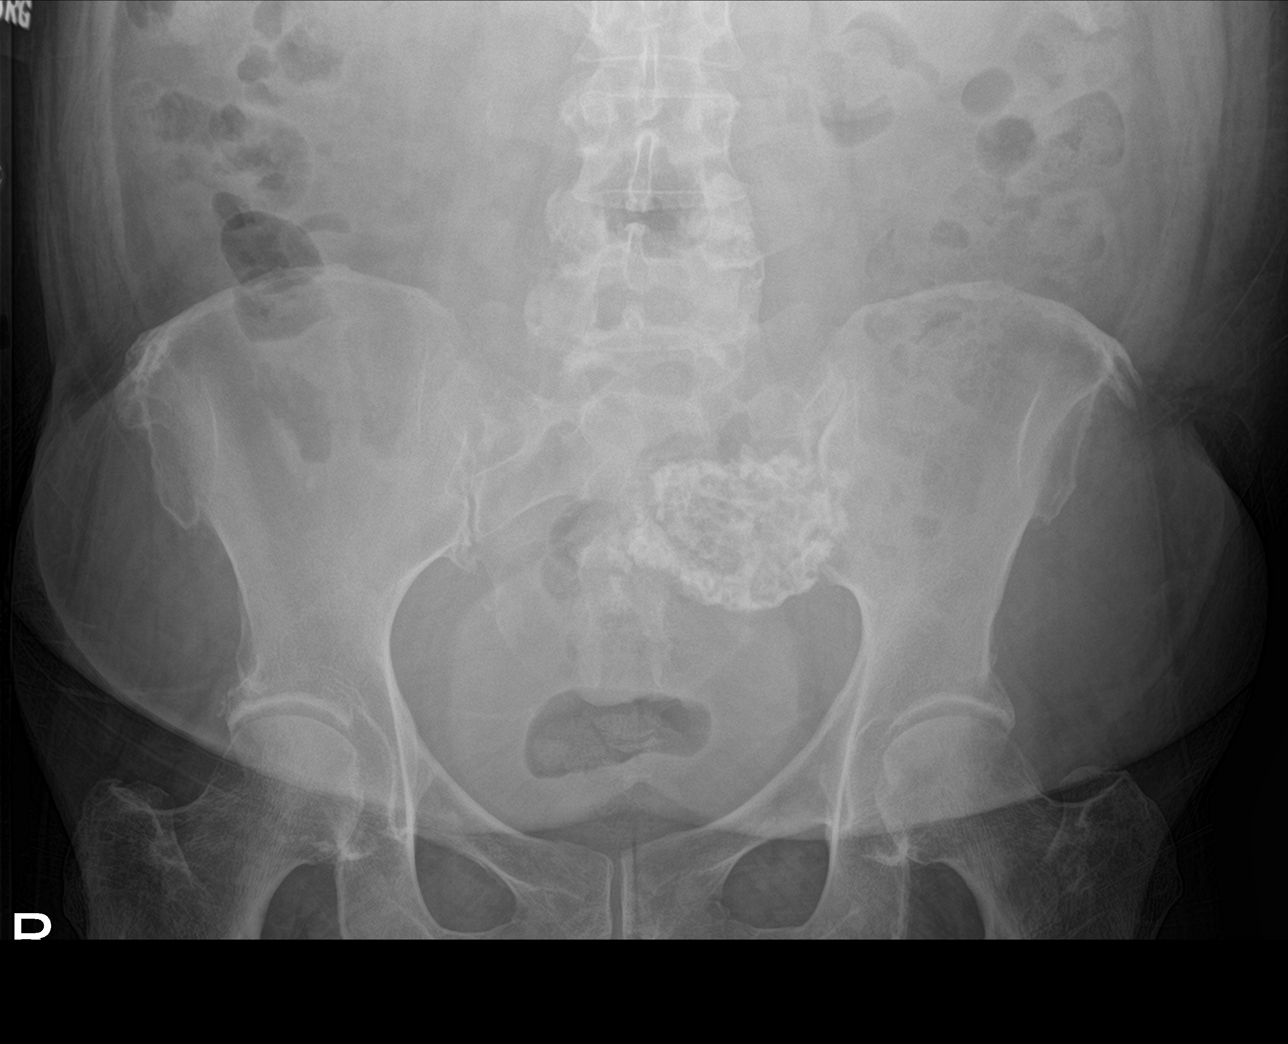

[chest ap]
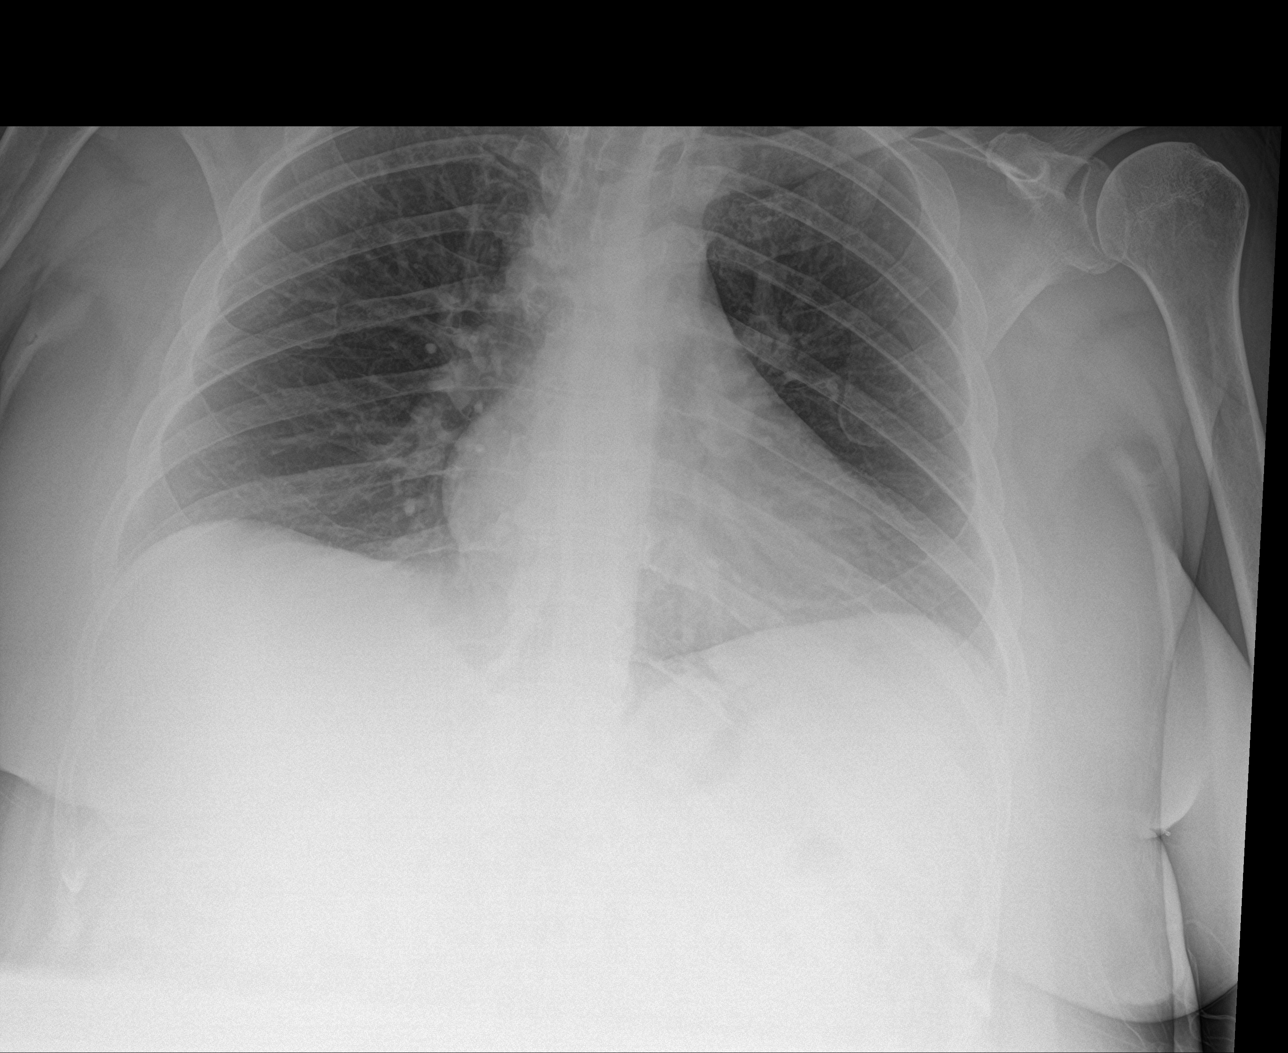

[abdomen supine (2 of 2)]
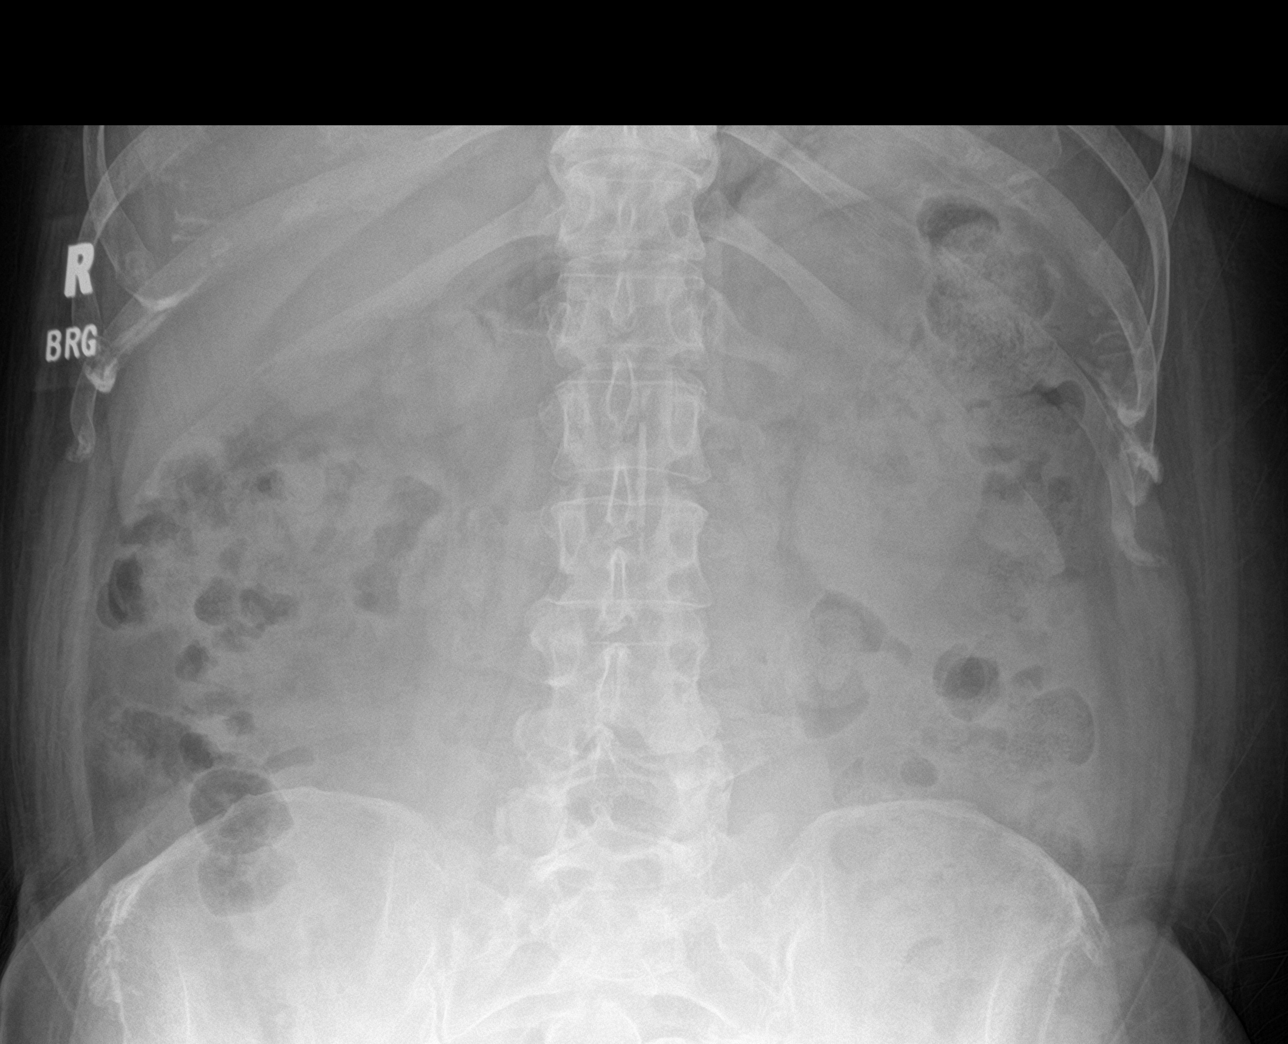

[4 of 4 positions shown; findings below may reference images not displayed]

FINDINGS: There is no evidence of dilated bowel loops or free intraperitoneal
air. Calcified uterine fibroid is noted in the pelvis. Heart size
and mediastinal contours are within normal limits. Both lungs are
clear.
IMPRESSION: No abnormal bowel dilatation.  No acute cardiopulmonary disease.

## 2021-02-28 IMAGING — CT CT ABD-PELV W/O CM
2 of 4 series · 16 of 46 positions shown, 18 images · non-contrast
Comparison: Ultrasound pelvis [DATE].

CLINICAL DATA: Nonlocalized acute abdominal pain.

EXAM:
CT ABDOMEN AND PELVIS WITHOUT CONTRAST
TECHNIQUE: Multidetector CT imaging of the abdomen and pelvis was performed
following the standard protocol without IV contrast.

[Series 2: axial st · axial · 0.85mm/px · z∈[-932,-497]mm · 13 of 97 slices shown, 15 images]
[im 5/97  soft-tissue]
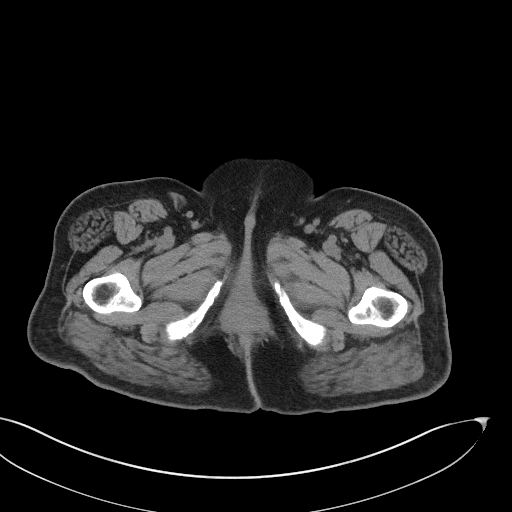
[im 5/97  bone]
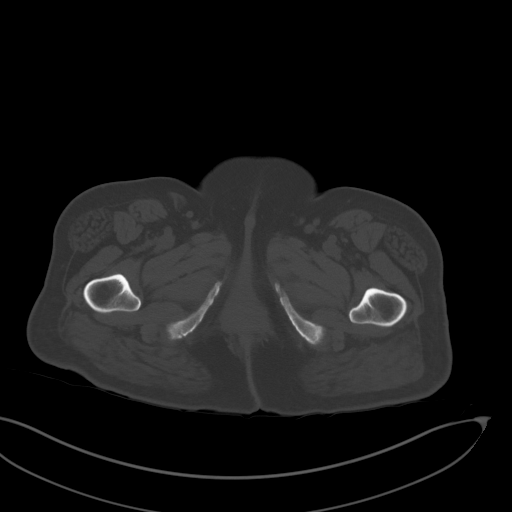
[im 15/97  soft-tissue]
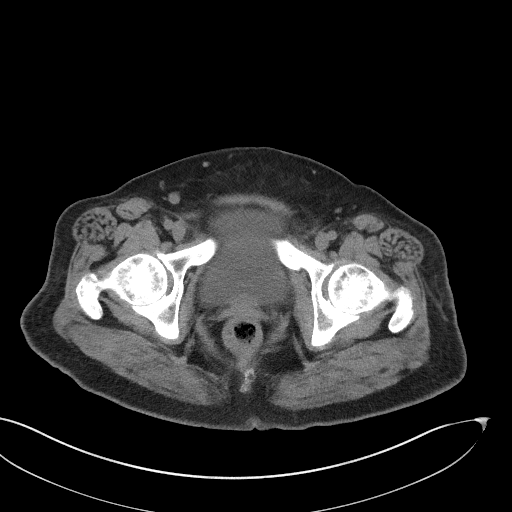
[im 20/97  soft-tissue]
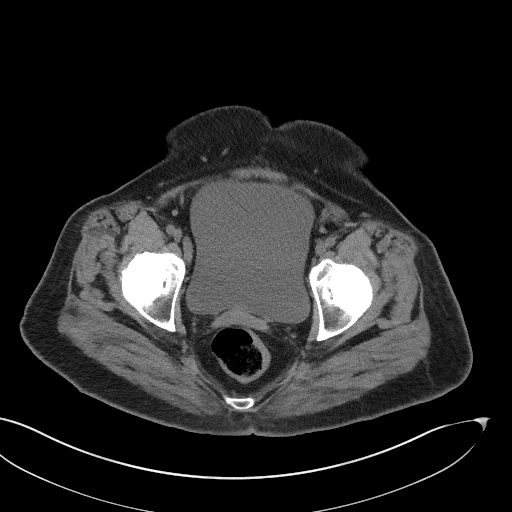
[im 29/97  soft-tissue]
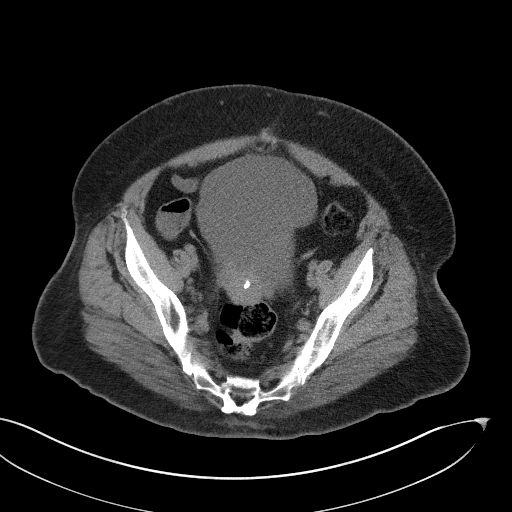
[im 34/97  soft-tissue]
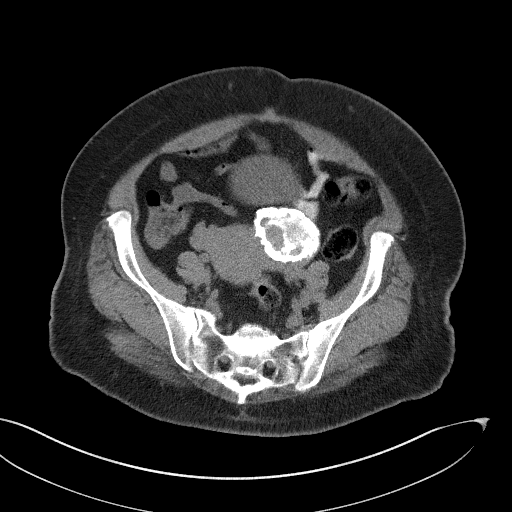
[im 44/97  soft-tissue]
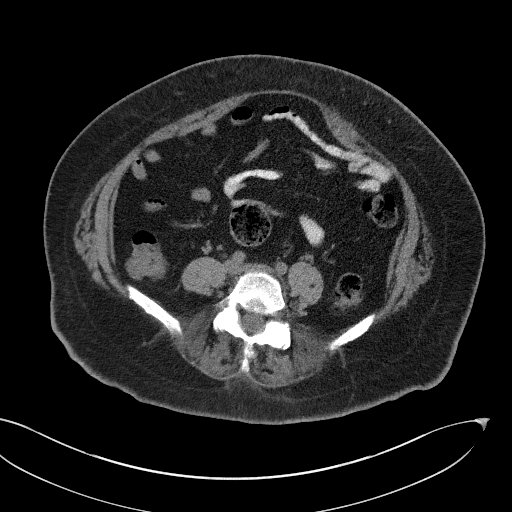
[im 49/97  soft-tissue]
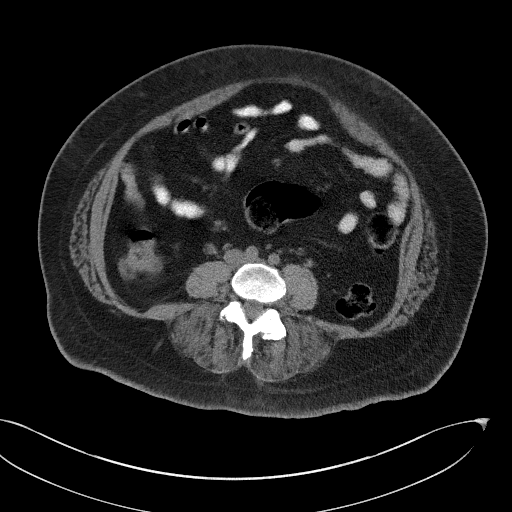
[im 53/97  soft-tissue]
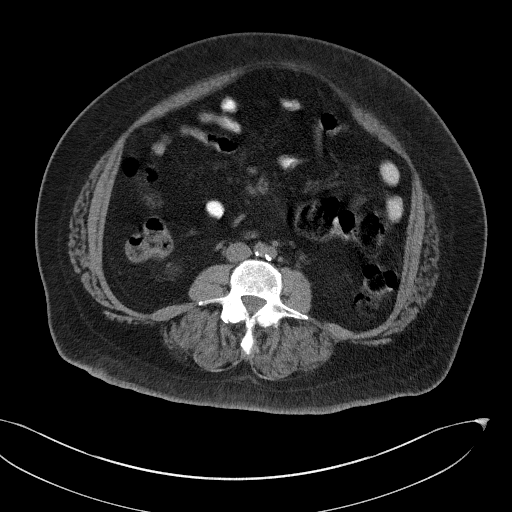
[im 63/97  soft-tissue]
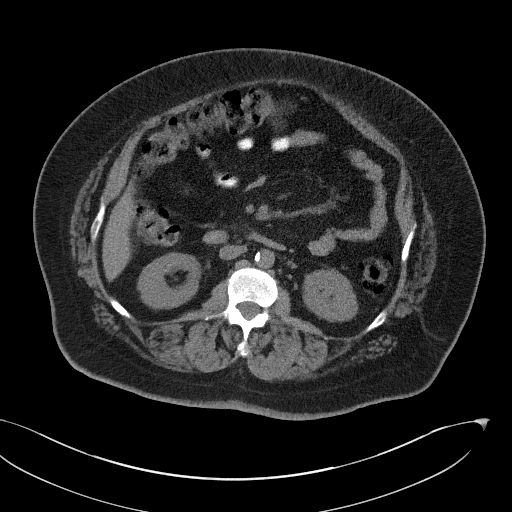
[im 63/97  bone]
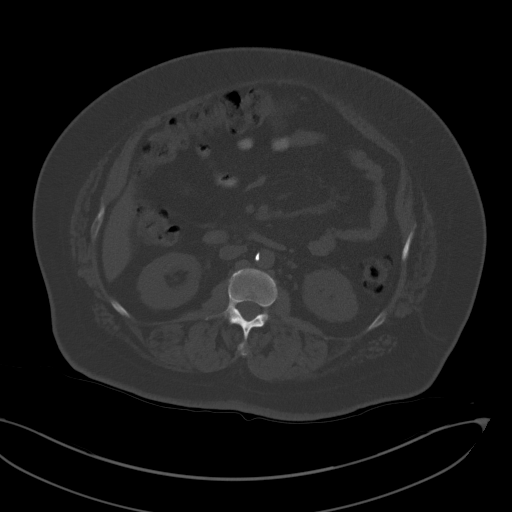
[im 68/97  soft-tissue]
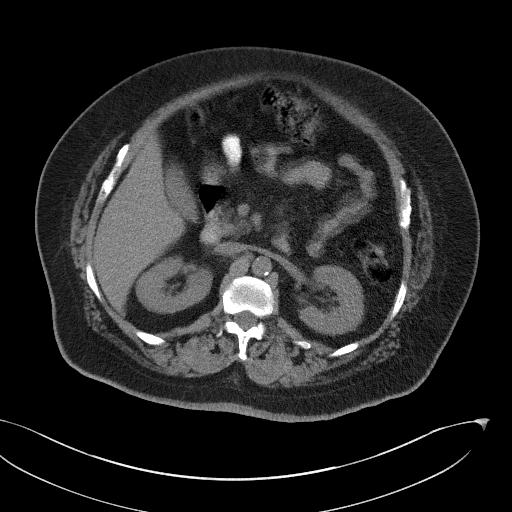
[im 77/97  soft-tissue]
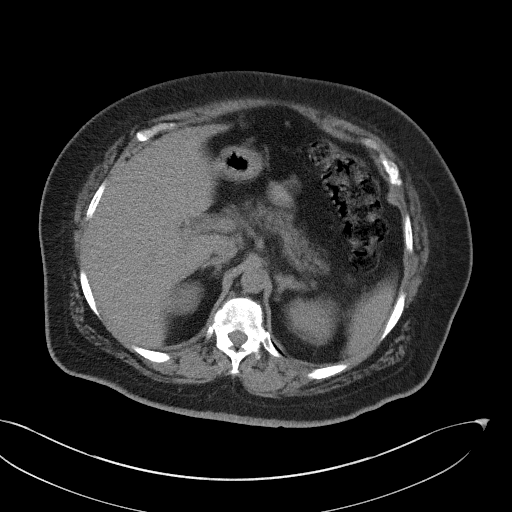
[im 82/97  soft-tissue]
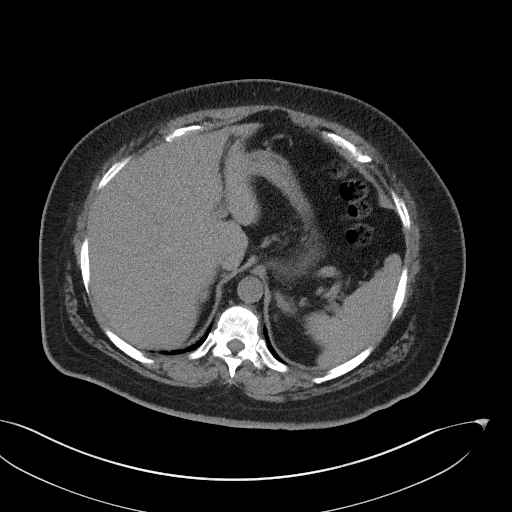
[im 92/97  soft-tissue]
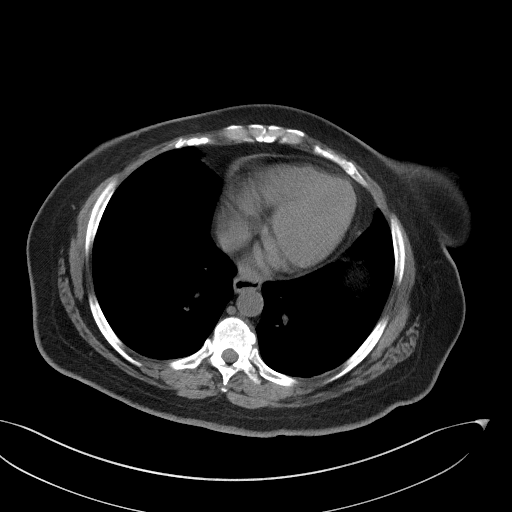

[Series 5: coronal st · coronal · 0.85mm/px · 3 of 123 slices shown]
[im 41/123  soft-tissue]
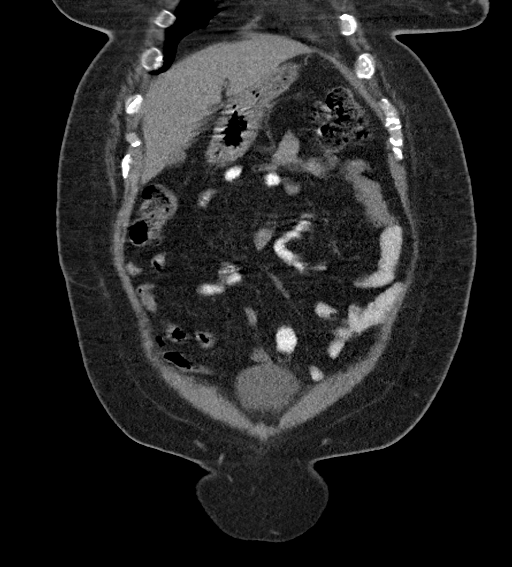
[im 55/123  soft-tissue]
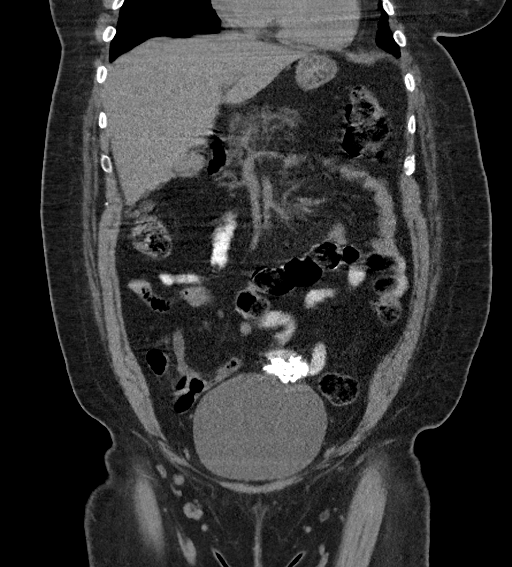
[im 68/123  soft-tissue]
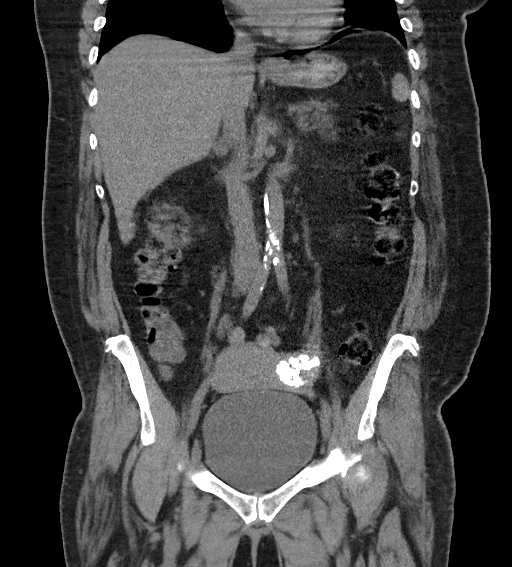

[16 of 46 positions shown; findings below may reference images not displayed]

FINDINGS: Lower chest: No acute abnormality.

Hepatobiliary: No focal liver abnormality. Tiny calcified stones
noted within the gallbladder lumen. No gallbladder wall thickening
or pericholecystic fluid. No biliary dilatation.

Pancreas: No focal lesion. Slight hazy pancreatic contour likely due
to motion artifact. Otherwise normal pancreatic contour. No
surrounding inflammatory changes. No main pancreatic ductal
dilatation.

Spleen: Normal in size without focal abnormality. A splenule is
noted.

Adrenals/Urinary Tract:

No adrenal nodule bilaterally.

Bilateral kidneys enhance symmetrically. No hydronephrosis. No
hydroureter.

The urinary bladder is unremarkable.

Stomach/Bowel: Stomach is within normal limits. No evidence of bowel
wall thickening or dilatation. Appendix appears normal.

Vascular/Lymphatic: No abdominal aorta or iliac aneurysm. At least
moderate atherosclerotic plaque of the aorta and its branches. No
abdominal, pelvic, or inguinal lymphadenopathy.

Reproductive: Redemonstration of a coarsely calcified left
subserosal fibroid measuring up to at least 5.5 cm. A central
coarsely calcified or in lesion is also noted likely representing
known submucosal uterine fibroid. Bilateral adnexa are otherwise
unremarkable.

Other: No intraperitoneal free fluid. No intraperitoneal free gas.
No organized fluid collection.

Musculoskeletal:

No abdominal wall hernia or abnormality.

No suspicious lytic or blastic osseous lesions. No acute displaced
fracture.
IMPRESSION: 1. Slight hazy pancreatic contour likely due to motion artifact.
Differential diagnosis includes acute pancreatitis. Recommend
correlation with lipase levels.
2. Cholelithiasis no CT findings of acute cholecystitis.
3. Subserosal and submucosal degenerative uterine fibroids.
4. Please note limited evaluation on this noncontrast study.

## 2021-02-28 MED ORDER — LACTATED RINGERS IV SOLN: INTRAVENOUS | Status: AC

## 2021-02-28 MED ORDER — ONDANSETRON HCL 4 MG/2ML IJ SOLN
4.0000 mg | Freq: Four times a day (QID) | INTRAMUSCULAR | Status: DC | PRN
  Administered 2021-03-02 – 2021-03-05 (×3): 4 mg via INTRAVENOUS
  Filled 2021-02-28 (×3): qty 2

## 2021-02-28 MED ORDER — SODIUM CHLORIDE 0.9 % IV BOLUS
1000.0000 mL | Freq: Once | INTRAVENOUS | Status: AC
  Administered 2021-02-28: 1000 mL via INTRAVENOUS

## 2021-02-28 MED ORDER — LACTATED RINGERS IV BOLUS
1000.0000 mL | Freq: Once | INTRAVENOUS | Status: AC
  Administered 2021-02-28: 1000 mL via INTRAVENOUS

## 2021-02-28 MED ORDER — HYDRALAZINE HCL 20 MG/ML IJ SOLN
10.0000 mg | INTRAMUSCULAR | Status: DC | PRN
  Administered 2021-02-28: 10 mg via INTRAVENOUS
  Filled 2021-02-28: qty 1

## 2021-02-28 MED ORDER — ONDANSETRON HCL 4 MG/2ML IJ SOLN
4.0000 mg | Freq: Once | INTRAMUSCULAR | Status: AC
  Administered 2021-02-28: 4 mg via INTRAVENOUS
  Filled 2021-02-28: qty 2

## 2021-02-28 MED ORDER — ENOXAPARIN SODIUM 40 MG/0.4ML IJ SOSY
40.0000 mg | PREFILLED_SYRINGE | INTRAMUSCULAR | Status: DC
Start: 2021-02-28 — End: 2021-02-28

## 2021-02-28 MED ORDER — INSULIN ASPART 100 UNIT/ML IJ SOLN
0.0000 [IU] | INTRAMUSCULAR | Status: DC
  Administered 2021-02-28: 9 [IU] via SUBCUTANEOUS
  Administered 2021-03-01: 5 [IU] via SUBCUTANEOUS
  Administered 2021-03-01: 3 [IU] via SUBCUTANEOUS
  Administered 2021-03-01: 5 [IU] via SUBCUTANEOUS
  Administered 2021-03-01 – 2021-03-02 (×5): 3 [IU] via SUBCUTANEOUS
  Administered 2021-03-02 – 2021-03-03 (×8): 2 [IU] via SUBCUTANEOUS
  Administered 2021-03-04 (×5): 5 [IU] via SUBCUTANEOUS
  Administered 2021-03-04: 2 [IU] via SUBCUTANEOUS
  Administered 2021-03-05: 7 [IU] via SUBCUTANEOUS
  Administered 2021-03-05: 3 [IU] via SUBCUTANEOUS
  Administered 2021-03-05: 9 [IU] via SUBCUTANEOUS
  Administered 2021-03-05: 2 [IU] via SUBCUTANEOUS
  Administered 2021-03-06: 5 [IU] via SUBCUTANEOUS
  Administered 2021-03-06: 2 [IU] via SUBCUTANEOUS
  Administered 2021-03-06: 3 [IU] via SUBCUTANEOUS
  Administered 2021-03-06: 5 [IU] via SUBCUTANEOUS
  Administered 2021-03-06: 2 [IU] via SUBCUTANEOUS
  Administered 2021-03-06: 5 [IU] via SUBCUTANEOUS
  Administered 2021-03-07: 3 [IU] via SUBCUTANEOUS
  Administered 2021-03-07 (×2): 2 [IU] via SUBCUTANEOUS
  Administered 2021-03-07 – 2021-03-08 (×2): 1 [IU] via SUBCUTANEOUS
  Administered 2021-03-08: 3 [IU] via SUBCUTANEOUS
  Administered 2021-03-08: 1 [IU] via SUBCUTANEOUS
  Filled 2021-02-28: qty 1

## 2021-02-28 MED ORDER — INSULIN GLARGINE 100 UNIT/ML SOLOSTAR PEN: 60.0000 [IU] | PEN_INJECTOR | Freq: Every day | SUBCUTANEOUS | Status: DC

## 2021-02-28 MED ORDER — ONDANSETRON HCL 4 MG PO TABS: 4.0000 mg | ORAL_TABLET | Freq: Four times a day (QID) | ORAL | Status: DC | PRN

## 2021-02-28 MED ORDER — INSULIN GLARGINE 100 UNIT/ML ~~LOC~~ SOLN
60.0000 [IU] | Freq: Every day | SUBCUTANEOUS | Status: DC
  Administered 2021-02-28 – 2021-03-03 (×4): 60 [IU] via SUBCUTANEOUS
  Filled 2021-02-28 (×7): qty 0.6

## 2021-02-28 MED ORDER — SODIUM CHLORIDE 0.9 % IV SOLN: INTRAVENOUS | Status: DC

## 2021-02-28 NOTE — ED Provider Notes (Signed)
[facility] EMERGENCY DEPARTMENT Provider Note   CSN: [id number] Arrival date & time: 02/28/21  1014     History Chief complaint: Persistent vomiting  Nicole Golden is a 60 y.o. female.  HPI  Patient presents to the ED for evaluation of persistent nausea and vomiting.  Patient states her symptoms all started after she got a COVID booster shot last week.  She started having issues with nausea and vomiting.  She feels that whenever she tries to eat or drink anything she will vomit.  She has not been able to keep anything down.  She not been have any diarrhea.  She is not having abdominal pain.  She has not had any trouble with fevers or chills.  Patient was in the emergency room on July 2 for the same symptoms.  Patient was noted to be hyperglycemic while she was in the emergency room.  She was treated for that in the ED. patient states she is not feeling any better since leaving the hospital.  She also states now she started to have some hallucinations.  Patient describes an episode where she was here in the hospital and she was seeing herself getting hooked up to a purple machine.  Patient states she surely was asleep when she had this hallucination.  Past Medical History:  Diagnosis Date   Diabetes mellitus without complication ([place])    Diabetic neuropathy ([place])    Diabetic neuropathy ([place])    Hypertension    Hypertensive retinopathy    OU    Patient Active Problem List   Diagnosis Date Noted   Constipation 05/08/2018   DKA (diabetic ketoacidosis) ([place]) 04/24/2016   Abdominal wall cellulitis 04/24/2016   Type II diabetes mellitus with complication, uncontrolled ([place]) 04/24/2016    Past Surgical History:  Procedure Laterality Date   CATARACT EXTRACTION Bilateral    EYE SURGERY Bilateral    Cat Sx   FOOT SURGERY     right foot   TUBAL LIGATION       OB History   No obstetric history on file.     Family History  Problem Relation Age of Onset   Breast cancer Paternal  Aunt    Diabetes Father    Colon cancer Neg Hx    Colon polyps Neg Hx     Social History   Tobacco Use   Smoking status: Former    Pack years: 0.00   Smokeless tobacco: Never  Vaping Use   Vaping Use: Never used  Substance Use Topics   Alcohol use: Yes    Comment: occas   Drug use: Never    Home Medications Prior to Admission medications   Medication Sig Start Date End Date Taking? Authorizing Provider  ACCU-CHEK AVIVA PLUS test strip  04/15/20   [provider]  acetaminophen (TYLENOL) 500 MG tablet Take 500 mg by mouth every 6 (six) hours as needed for fever. Patient not taking: Reported on 09/29/2020    [provider]  amLODipine (NORVASC) 2.5 MG tablet Take 2.5 mg by mouth daily.    [provider]  aspirin EC 81 MG tablet Take 81 mg by mouth daily. Swallow whole.    [provider]  atropine 1 % ophthalmic solution Place 1 drop into the left eye 2 (two) times daily. 08/06/20   [provider]  dorzolamide-timolol (COSOPT) 22.3-6.8 MG/ML ophthalmic solution Place 1 drop into the left eye 2 (two) times daily. 08/06/20   [provider]  furosemide (LASIX) 20  MG tablet Take 20 mg by mouth daily as needed for edema. 03/04/20   [provider]  Insulin Pen Needle (B-D ULTRAFINE III SHORT PEN) 31G X 8 MM MISC 1 each by Does not apply route as directed. 08/26/20   Cassandria Anger, MD  LANTUS SOLOSTAR 100 UNIT/ML Solostar Pen Inject 60 Units into the skin at bedtime. 03/04/20   [provider]  latanoprost (XALATAN) 0.005 % ophthalmic solution 1 drop daily. 08/10/20   [provider]  losartan-hydrochlorothiazide (HYZAAR) 100-12.5 MG tablet Take 1 tablet by mouth daily.    [provider]  NOVOLOG FLEXPEN 100 UNIT/ML FlexPen Inject 10-16 Units into the skin 3 (three) times daily before meals. 03/04/20   [provider]  ondansetron (ZOFRAN ODT) 4 MG disintegrating tablet Take 1 tablet (4  mg total) by mouth every 8 (eight) hours as needed for nausea or vomiting. 02/27/21   Horton, Barbette Hair, MD  prednisoLONE acetate (PRED FORTE) 1 % ophthalmic suspension Place 1 drop into the left eye 3 (three) times daily. 08/06/20   [provider]  rosuvastatin (CRESTOR) 40 MG tablet Take 40 mg by mouth daily.    [provider]  SANTYL ointment  05/07/20   [provider]    Allergies    Patient has no known allergies.  Review of Systems   Review of Systems  All other systems reviewed and are negative.  Physical Exam Updated Vital Signs BP (!) 174/82   Pulse (!) 106   Temp 98.2 F (36.8 C) (Oral)   Resp 18   SpO2 97%   Physical Exam Vitals and nursing note reviewed.  Constitutional:      General: She is not in acute distress.    Appearance: She is well-developed.  HENT:     Head: Normocephalic and atraumatic.     Right Ear: External ear normal.     Left Ear: External ear normal.  Eyes:     General: No scleral icterus.       Right eye: No discharge.        Left eye: No discharge.     Conjunctiva/sclera: Conjunctivae normal.  Neck:     Trachea: No tracheal deviation.  Cardiovascular:     Rate and Rhythm: Normal rate and regular rhythm.  Pulmonary:     Effort: Pulmonary effort is normal. No respiratory distress.     Breath sounds: Normal breath sounds. No stridor. No wheezing or rales.  Abdominal:     General: Bowel sounds are normal. There is no distension.     Palpations: Abdomen is soft.     Tenderness: There is no abdominal tenderness. There is no guarding or rebound.  Musculoskeletal:        General: No tenderness or deformity.     Cervical back: Neck supple.  Skin:    General: Skin is warm and dry.     Findings: No rash.  Neurological:     General: No focal deficit present.     Mental Status: She is alert.     Cranial Nerves: No cranial nerve deficit (no facial droop, extraocular movements intact, no slurred speech).     Sensory:  No sensory deficit.     Motor: No abnormal muscle tone or seizure activity.     Coordination: Coordination normal.  Psychiatric:        Mood and Affect: Mood normal.    ED Results / Procedures / Treatments   Labs (all labs ordered are listed,  but only abnormal results are displayed) Labs Reviewed  COMPREHENSIVE METABOLIC PANEL - Abnormal; Notable for the following components:      Result Value   Sodium 133 (*)    CO2 19 (*)    Glucose, Bld 441 (*)    BUN 40 (*)    Creatinine, Ser 1.82 (*)    Calcium 8.8 (*)    Albumin 3.0 (*)    GFR, Estimated 31 (*)    All other components within normal limits  LIPASE, BLOOD - Abnormal; Notable for the following components:   Lipase 73 (*)    All other components within normal limits  CBC WITH DIFFERENTIAL/PLATELET - Abnormal; Notable for the following components:   WBC 11.9 (*)    Hemoglobin 10.9 (*)    HCT 34.6 (*)    MCH 25.6 (*)    Neutro Abs 10.1 (*)    Abs Immature Granulocytes 0.13 (*)    All other components within normal limits  BASIC METABOLIC PANEL - Abnormal; Notable for the following components:   Sodium 134 (*)    CO2 20 (*)    Glucose, Bld 400 (*)    BUN 38 (*)    Creatinine, Ser 1.66 (*)    Calcium 8.5 (*)    GFR, Estimated 35 (*)    All other components within normal limits  GLUCOSE, CAPILLARY - Abnormal; Notable for the following components:   Glucose-Capillary 248 (*)    All other components within normal limits  HEPATIC FUNCTION PANEL - Abnormal; Notable for the following components:   Total Protein 5.9 (*)    Albumin 2.3 (*)    AST 59 (*)    All other components within normal limits  CBC WITH DIFFERENTIAL/PLATELET - Abnormal; Notable for the following components:   RBC 3.64 (*)    Hemoglobin 9.3 (*)    HCT 29.3 (*)    MCH 25.5 (*)    Neutro Abs 7.9 (*)    Abs Immature Granulocytes 0.18 (*)    All other components within normal limits  BASIC METABOLIC PANEL - Abnormal; Notable for the following  components:   CO2 21 (*)    Glucose, Bld 253 (*)    BUN 38 (*)    Creatinine, Ser 1.77 (*)    Calcium 8.3 (*)    GFR, Estimated 32 (*)    All other components within normal limits  GLUCOSE, CAPILLARY - Abnormal; Notable for the following components:   Glucose-Capillary 235 (*)    All other components within normal limits  CBG MONITORING, ED - Abnormal; Notable for the following components:   Glucose-Capillary 386 (*)    All other components within normal limits  CBG MONITORING, ED - Abnormal; Notable for the following components:   Glucose-Capillary 329 (*)    All other components within normal limits  SARS CORONAVIRUS 2 (TAT 6-24 HRS)  ETHANOL  HIV ANTIBODY (ROUTINE TESTING W REFLEX)  URINALYSIS, ROUTINE W REFLEX MICROSCOPIC  RAPID URINE DRUG SCREEN, HOSP PERFORMED  HEMOGLOBIN A1C    EKG None  Radiology No results found.  Procedures Procedures   Medications Ordered in ED Medications  sodium chloride 0.9 % bolus 1,000 mL (has no administration in time range)    And  0.9 %  sodium chloride infusion (has no administration in time range)  ondansetron (ZOFRAN) injection 4 mg (has no administration in time range)    ED Course  I have reviewed the triage vital signs and the nursing notes.  Pertinent labs &  imaging results that were available during my care of the patient were reviewed by me and considered in my medical decision making (see chart for details).  Clinical Course as of 03/01/21 0835  Mon Feb 28, 2021  1350 Lipase slightly elevated at 73 [JK]  1350 Hemoglobin decreased from 2 days ago.  Metabolic panel shows increased creatinine.  Persistent elevation in glucose [JK]  1351 Abdominal series without acute findings [JK]    Clinical Course User Index [JK] Dorie Rank, MD   MDM Rules/Calculators/A&P                          Pt presented with persistent nausea and vomiting.  Recently seen for hyperglycemia.  Labs notable for elevated lfts, decrease  hemoglobin, persistent hyperglycemia although blood sugar is decreasing.  Pt given IV fluids, antiemetics.  CT scan ordered to evaluate for pancreatitis, possible obstruction.  Care turned over to Dr Roderic Palau, pending CT scan. Final Clinical Impression(s) / ED Diagnoses pending    Dorie Rank, MD 03/01/21 [phone]

## 2021-02-28 NOTE — ED Notes (Signed)
Patient transported to X-ray 

## 2021-02-28 NOTE — ED Notes (Signed)
Got pt an additional pillow

## 2021-02-28 NOTE — H&P (Signed)
History and Physical    Nicole Golden PPJ:[id number] DOB: Jun 12, 1961 DOA: 02/28/2021  PCP: [name], DO  Patient coming from: Home.  Chief Complaint: Nausea vomiting.  HPI: Nicole Golden is a 60 y.o. female with history of diabetes mellitus type 2 poorly controlled with history of hypertension chronic kidney disease stage III and chronic lower extremity wound presents to the ER with complaints of persistent nausea vomiting for the last 4 days.  Also has been having some epigastric discomfort denies any blood in the vomitus denies any diarrhea.  Unable to keep anything but has taken her insulin last night.  ED Course: In the ER on exam patient has mild epigastric tenderness lipase 1173 AST and ALT are normal total bili normal.  WBC 11.9 hemoglobin around baseline.  CT scanning of the abdomen pelvis done shows possibility of pancreatitis.  Patient's blood sugar is also 441.  Patient started on fluids admitted for possible acute pancreatitis with nausea vomiting.  CT scan does show some cholelithiasis.  COVID test is pending.  Review of Systems: As per HPI, rest all negative.   Past Medical History:  Diagnosis Date   Diabetes mellitus without complication (HCC)    Diabetic neuropathy ([place])    Diabetic neuropathy ([place])    Hypertension    Hypertensive retinopathy    OU    Past Surgical History:  Procedure Laterality Date   CATARACT EXTRACTION Bilateral    EYE SURGERY Bilateral    Cat Sx   FOOT SURGERY     right foot   TUBAL LIGATION       reports that she has quit smoking. She has never used smokeless tobacco. She reports current alcohol use. She reports that she does not use drugs.  No Known Allergies  Family History  Problem Relation Age of Onset   Breast cancer Paternal Aunt    Diabetes Father    Colon cancer Neg Hx    Colon polyps Neg Hx     Prior to Admission medications   Medication Sig Start Date End Date Taking? Authorizing Provider  ACCU-CHEK AVIVA PLUS  test strip  04/15/20   [provider]  acetaminophen (TYLENOL) 500 MG tablet Take 500 mg by mouth every 6 (six) hours as needed for fever. Patient not taking: Reported on 09/29/2020    [provider]  amLODipine (NORVASC) 2.5 MG tablet Take 2.5 mg by mouth daily.    [provider]  aspirin EC 81 MG tablet Take 81 mg by mouth daily. Swallow whole.    [provider]  atropine 1 % ophthalmic solution Place 1 drop into the left eye 2 (two) times daily. 08/06/20   [provider]  dorzolamide-timolol (COSOPT) 22.3-6.8 MG/ML ophthalmic solution Place 1 drop into the left eye 2 (two) times daily. 08/06/20   [provider]  furosemide (LASIX) 20 MG tablet Take 20 mg by mouth daily as needed for edema. 03/04/20   [provider]  Insulin Pen Needle (B-D ULTRAFINE III SHORT PEN) 31G X 8 MM MISC 1 each by Does not apply route as directed. 08/26/20   Cassandria Anger, MD  LANTUS SOLOSTAR 100 UNIT/ML Solostar Pen Inject 60 Units into the skin at bedtime. 03/04/20   [provider]  latanoprost (XALATAN) 0.005 % ophthalmic solution 1 drop daily. 08/10/20   [provider]  losartan-hydrochlorothiazide (HYZAAR) 100-12.5 MG tablet Take 1 tablet by mouth daily.    [provider]  NOVOLOG FLEXPEN 100 UNIT/ML  FlexPen Inject 10-16 Units into the skin 3 (three) times daily before meals. 03/04/20   [provider]  ondansetron (ZOFRAN ODT) 4 MG disintegrating tablet Take 1 tablet (4 mg total) by mouth every 8 (eight) hours as needed for nausea or vomiting. 02/27/21   Horton, Barbette Hair, MD  prednisoLONE acetate (PRED FORTE) 1 % ophthalmic suspension Place 1 drop into the left eye 3 (three) times daily. 08/06/20   [provider]  rosuvastatin (CRESTOR) 40 MG tablet Take 40 mg by mouth daily.    [provider]  SANTYL ointment  05/07/20   [provider]    Physical Exam: Constitutional:  Moderately built and nourished. Vitals:   02/28/21 1200 02/28/21 1230 02/28/21 1300 02/28/21 1445  BP: (!) 159/87 (!) 174/82    Pulse: 100 (!) 106 (!) 104 (!) 110  Resp:      Temp:      TempSrc:      SpO2: 98% 97% 99% 99%   Eyes: Anicteric no pallor. ENMT: No discharge from the ears eyes nose and mouth. Neck: No mass felt.  No neck rigidity. Respiratory: No rhonchi or crepitations. Cardiovascular: S1-S2 heard. Abdomen: Epigastric tenderness present. Musculoskeletal: No edema. Skin: No rash. Neurologic: Alert awake oriented to time place and person.  Moves all extremities. Psychiatric: Appears normal.  Normal affect.   Labs on Admission: I have personally reviewed following labs and imaging studies  CBC: Recent Labs  Lab 02/26/21 2000 02/28/21 1305  WBC 11.2* 11.9*  NEUTROABS  --  10.1*  HGB 11.2* 10.9*  HCT 35.3* 34.6*  MCV 81.1 81.4  PLT 240 409   Basic Metabolic Panel: Recent Labs  Lab 02/26/21 2000 02/28/21 1305  NA 129* 133*  K 4.5 4.0  CL 94* 101  CO2 20* 19*  GLUCOSE 603* 441*  BUN 40* 40*  CREATININE 1.68* 1.82*  CALCIUM 9.0 8.8*   GFR: CrCl cannot be calculated (Unknown ideal weight.). Liver Function Tests: Recent Labs  Lab 02/26/21 2000 02/28/21 1305  AST 24 39  ALT 25 38  ALKPHOS 82 88  BILITOT 1.1 1.0  PROT 7.8 7.2  ALBUMIN 3.4* 3.0*   Recent Labs  Lab 02/26/21 2000 02/28/21 1305  LIPASE 20 73*   No results for input(s): AMMONIA in the last 168 hours. Coagulation Profile: No results for input(s): INR, PROTIME in the last 168 hours. Cardiac Enzymes: No results for input(s): CKTOTAL, CKMB, CKMBINDEX, TROPONINI in the last 168 hours. BNP (last 3 results) No results for input(s): PROBNP in the last 8760 hours. HbA1C: No results for input(s): HGBA1C in the last 72 hours. CBG: Recent Labs  Lab 02/26/21 2248 02/27/21 0021 02/27/21 0104 02/27/21 0208  GLUCAP 497* 408* 414* 395*   Lipid Profile: No results for input(s): CHOL,  HDL, LDLCALC, TRIG, CHOLHDL, LDLDIRECT in the last 72 hours. Thyroid Function Tests: No results for input(s): TSH, T4TOTAL, FREET4, T3FREE, THYROIDAB in the last 72 hours. Anemia Panel: No results for input(s): VITAMINB12, FOLATE, FERRITIN, TIBC, IRON, RETICCTPCT in the last 72 hours. Urine analysis:    Component Value Date/Time   COLORURINE YELLOW 02/27/2021 0043   APPEARANCEUR CLEAR 02/27/2021 0043   LABSPEC 1.015 02/27/2021 0043   PHURINE 6.0 02/27/2021 0043   GLUCOSEU >=500 (A) 02/27/2021 0043   HGBUR MODERATE (A) 02/27/2021 0043   BILIRUBINUR NEGATIVE 02/27/2021 0043   KETONESUR 20 (A) 02/27/2021 0043   PROTEINUR 100 (A) 02/27/2021 0043   NITRITE NEGATIVE 02/27/2021 0043   LEUKOCYTESUR NEGATIVE 02/27/2021 0043  Sepsis Labs: @LABRCNTIP (procalcitonin:4,lacticidven:4) )No results found for this or any previous visit (from the past 240 hour(s)).   Radiological Exams on Admission: CT ABDOMEN PELVIS WO CONTRAST  Result Date: 02/28/2021 CLINICAL DATA:  Nonlocalized acute abdominal pain. EXAM: CT ABDOMEN AND PELVIS WITHOUT CONTRAST TECHNIQUE: Multidetector CT imaging of the abdomen and pelvis was performed following the standard protocol without IV contrast. COMPARISON:  Ultrasound pelvis 02/04/2018. FINDINGS: Lower chest: No acute abnormality. Hepatobiliary: No focal liver abnormality. Tiny calcified stones noted within the gallbladder lumen. No gallbladder wall thickening or pericholecystic fluid. No biliary dilatation. Pancreas: No focal lesion. Slight hazy pancreatic contour likely due to motion artifact. Otherwise normal pancreatic contour. No surrounding inflammatory changes. No main pancreatic ductal dilatation. Spleen: Normal in size without focal abnormality. A splenule is noted. Adrenals/Urinary Tract: No adrenal nodule bilaterally. Bilateral kidneys enhance symmetrically. No hydronephrosis. No hydroureter. The urinary bladder is unremarkable. Stomach/Bowel: Stomach is within normal  limits. No evidence of bowel wall thickening or dilatation. Appendix appears normal. Vascular/Lymphatic: No abdominal aorta or iliac aneurysm. At least moderate atherosclerotic plaque of the aorta and its branches. No abdominal, pelvic, or inguinal lymphadenopathy. Reproductive: Redemonstration of a coarsely calcified left subserosal fibroid measuring up to at least 5.5 cm. A central coarsely calcified or in lesion is also noted likely representing known submucosal uterine fibroid. Bilateral adnexa are otherwise unremarkable. Other: No intraperitoneal free fluid. No intraperitoneal free gas. No organized fluid collection. Musculoskeletal: No abdominal wall hernia or abnormality. No suspicious lytic or blastic osseous lesions. No acute displaced fracture. IMPRESSION: 1. Slight hazy pancreatic contour likely due to motion artifact. Differential diagnosis includes acute pancreatitis. Recommend correlation with lipase levels. 2. Cholelithiasis no CT findings of acute cholecystitis. 3. Subserosal and submucosal degenerative uterine fibroids. 4. Please note limited evaluation on this noncontrast study. Electronically Signed   By: Iven Finn M.D.   On: 02/28/2021 17:06   DG Abd Acute W/Chest  Result Date: 02/28/2021 CLINICAL DATA:  Vomiting. EXAM: DG ABDOMEN ACUTE WITH 1 VIEW CHEST COMPARISON:  None. FINDINGS: There is no evidence of dilated bowel loops or free intraperitoneal air. Calcified uterine fibroid is noted in the pelvis. Heart size and mediastinal contours are within normal limits. Both lungs are clear. IMPRESSION: No abnormal bowel dilatation.  No acute cardiopulmonary disease. Electronically Signed   By: Marijo Conception M.D.   On: 02/28/2021 13:39     Assessment/Plan Principal Problem:   Nausea & vomiting Active Problems:   Type II diabetes mellitus with complication, uncontrolled (HCC)   Essential hypertension    Intractable nausea vomiting likely could be from acute pancreatitis patient's  CAT scan does show cholelithiasis.  I will order an MRCP and follow LFTs.  Consult GI in the morning.  We will keep patient on clear liquids pain medications and aggressive hydration. Uncontrolled diabetes mellitus type 2 we will check hemoglobin A1c last 1 in December 2021 was 12.4.  At this time I have ordered a repeat basic metabolic panel if continues to show elevated blood sugar and if anion gap is elevated may need insulin infusion.  Otherwise we will keep patient on sliding scale coverage along with patient's home dose of Lantus. Hypertension uncontrolled since patient is not tolerating oral diet we will keep patient n.p.o. and IV hydralazine until patient can tolerate oral medications. Chronic kidney disease stage III creatinine appears to be at baseline. Chronic anemia likely from renal disease follow CBC.  Since patient has possibility of acute pancreatitis with intractable nausea vomiting and  uncontrolled diabetes will need close monitoring and inpatient status.   DVT prophylaxis: SCDs.  We will hold anticoagulation until get MRCP. Code Status: Full code. Family Communication: Discussed with patient. Disposition Plan: Home. Consults called: None. Admission status: Inpatient.   [name] MD [facility] Pager [phone].  If 7PM-7AM, please contact night-coverage [email or web address] Password [facility]  02/28/2021, 6:40 PM

## 2021-02-28 NOTE — ED Triage Notes (Signed)
States she continues to vomit, seen 2 days ago for same, also states she is seeing things that are not there at home

## 2021-03-01 ENCOUNTER — Inpatient Hospital Stay (HOSPITAL_COMMUNITY): Payer: Medicare HMO

## 2021-03-01 ENCOUNTER — Encounter (HOSPITAL_BASED_OUTPATIENT_CLINIC_OR_DEPARTMENT_OTHER): Payer: Medicare HMO | Attending: Internal Medicine | Admitting: Internal Medicine

## 2021-03-01 DIAGNOSIS — A419 Sepsis, unspecified organism: Secondary | ICD-10-CM | POA: Diagnosis present

## 2021-03-01 DIAGNOSIS — K8020 Calculus of gallbladder without cholecystitis without obstruction: Secondary | ICD-10-CM

## 2021-03-01 DIAGNOSIS — L03119 Cellulitis of unspecified part of limb: Secondary | ICD-10-CM

## 2021-03-01 DIAGNOSIS — I10 Essential (primary) hypertension: Secondary | ICD-10-CM

## 2021-03-01 DIAGNOSIS — E11628 Type 2 diabetes mellitus with other skin complications: Secondary | ICD-10-CM

## 2021-03-01 LAB — CBC WITH DIFFERENTIAL/PLATELET
Abs Immature Granulocytes: 0.18 10*3/uL — ABNORMAL HIGH (ref 0.00–0.07)
Basophils Absolute: 0 10*3/uL (ref 0.0–0.1)
Basophils Relative: 0 %
Eosinophils Absolute: 0.1 10*3/uL (ref 0.0–0.5)
Eosinophils Relative: 1 %
HCT: 29.3 % — ABNORMAL LOW (ref 36.0–46.0)
Hemoglobin: 9.3 g/dL — ABNORMAL LOW (ref 12.0–15.0)
Immature Granulocytes: 2 %
Lymphocytes Relative: 12 %
Lymphs Abs: 1.2 10*3/uL (ref 0.7–4.0)
MCH: 25.5 pg — ABNORMAL LOW (ref 26.0–34.0)
MCHC: 31.7 g/dL (ref 30.0–36.0)
MCV: 80.5 fL (ref 80.0–100.0)
Monocytes Absolute: 0.7 10*3/uL (ref 0.1–1.0)
Monocytes Relative: 7 %
Neutro Abs: 7.9 10*3/uL — ABNORMAL HIGH (ref 1.7–7.7)
Neutrophils Relative %: 78 %
Platelets: 226 10*3/uL (ref 150–400)
RBC: 3.64 MIL/uL — ABNORMAL LOW (ref 3.87–5.11)
RDW: 14 % (ref 11.5–15.5)
WBC: 10.1 10*3/uL (ref 4.0–10.5)
nRBC: 0 % (ref 0.0–0.2)

## 2021-03-01 LAB — URINALYSIS, ROUTINE W REFLEX MICROSCOPIC
Bilirubin Urine: NEGATIVE
Cellular Cast, UA: 25
Glucose, UA: >=500 mg/dL — AB
Ketones, ur: NEGATIVE mg/dL
Leukocytes,Ua: NEGATIVE
Nitrite: NEGATIVE
Protein, ur: >=300 mg/dL — AB
Specific Gravity, Urine: 1.015 (ref 1.005–1.030)
pH: 5 (ref 5.0–8.0)

## 2021-03-01 LAB — RAPID URINE DRUG SCREEN, HOSP PERFORMED
Amphetamines: NOT DETECTED
Barbiturates: NOT DETECTED
Benzodiazepines: NOT DETECTED
Cocaine: NOT DETECTED
Opiates: NOT DETECTED
Tetrahydrocannabinol: NOT DETECTED

## 2021-03-01 LAB — HEPATIC FUNCTION PANEL
ALT: 42 U/L (ref 0–44)
AST: 59 U/L — ABNORMAL HIGH (ref 15–41)
Albumin: 2.3 g/dL — ABNORMAL LOW (ref 3.5–5.0)
Alkaline Phosphatase: 81 U/L (ref 38–126)
Bilirubin, Direct: 0.2 mg/dL (ref 0.0–0.2)
Indirect Bilirubin: 0.5 mg/dL (ref 0.3–0.9)
Total Bilirubin: 0.7 mg/dL (ref 0.3–1.2)
Total Protein: 5.9 g/dL — ABNORMAL LOW (ref 6.5–8.1)

## 2021-03-01 LAB — BASIC METABOLIC PANEL
Anion gap: 10 (ref 5–15)
BUN: 38 mg/dL — ABNORMAL HIGH (ref 8–23)
CO2: 21 mmol/L — ABNORMAL LOW (ref 22–32)
Calcium: 8.3 mg/dL — ABNORMAL LOW (ref 8.9–10.3)
Chloride: 105 mmol/L (ref 98–111)
Creatinine, Ser: 1.77 mg/dL — ABNORMAL HIGH (ref 0.44–1.00)
GFR, Estimated: 32 mL/min — ABNORMAL LOW (ref >60)
Glucose, Bld: 253 mg/dL — ABNORMAL HIGH (ref 70–99)
Potassium: 3.5 mmol/L (ref 3.5–5.1)
Sodium: 136 mmol/L (ref 135–145)

## 2021-03-01 LAB — GLUCOSE, CAPILLARY
Glucose-Capillary: 248 mg/dL — ABNORMAL HIGH (ref 70–99)
Glucose-Capillary: 235 mg/dL — ABNORMAL HIGH (ref 70–99)
Glucose-Capillary: 256 mg/dL — ABNORMAL HIGH (ref 70–99)
Glucose-Capillary: 296 mg/dL — ABNORMAL HIGH (ref 70–99)
Glucose-Capillary: 211 mg/dL — ABNORMAL HIGH (ref 70–99)
Glucose-Capillary: 221 mg/dL — ABNORMAL HIGH (ref 70–99)

## 2021-03-01 LAB — SARS CORONAVIRUS 2 (TAT 6-24 HRS): SARS Coronavirus 2: NEGATIVE

## 2021-03-01 LAB — C-REACTIVE PROTEIN: CRP: 43.8 mg/dL — ABNORMAL HIGH (ref <1.0)

## 2021-03-01 LAB — SEDIMENTATION RATE: Sed Rate: 130 mm/hr — ABNORMAL HIGH (ref 0–22)

## 2021-03-01 IMAGING — MR MR MRCP
10 of 12 series · 35 of 48 positions shown · non-contrast
Comparison: CT abdomen [DATE]

CLINICAL DATA: Abdominal pain.  Cholelithiasis.

EXAM:
MRI ABDOMEN WITHOUT CONTRAST  (INCLUDING MRCP)
TECHNIQUE: Multiplanar multisequence MR imaging of the abdomen was performed.
Heavily T2-weighted images of the biliary and pancreatic ducts were
obtained, and three-dimensional MRCP images were rendered by post
processing.

[Series 4: ax haste · axial · 6.0mm · 0.74mm/px · z∈[-140,+68]mm · 2 of 30 slices shown]
[im 1/30]
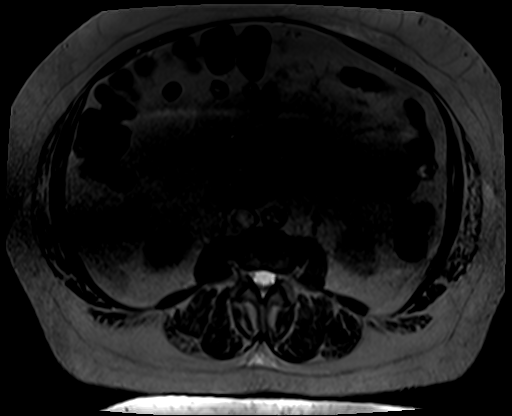
[im 30/30]
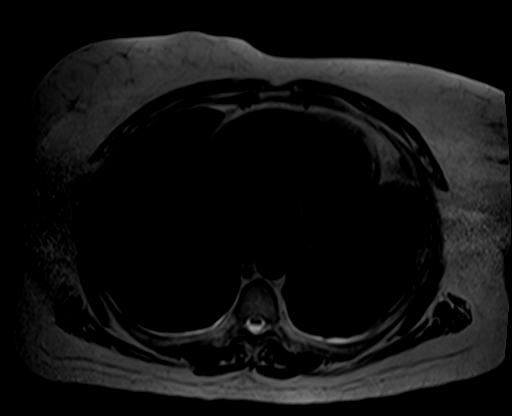

[Series 5: cor haste · coronal · 6.0mm · 1.25mm/px · 3 of 36 slices shown]
[im 1/36]
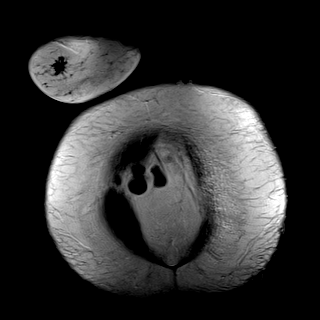
[im 18/36]
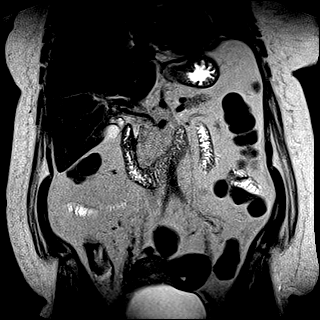
[im 36/36]
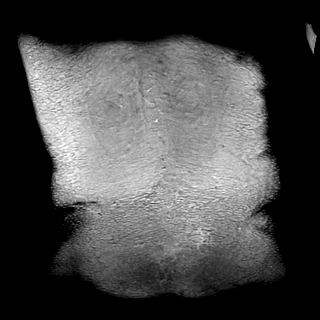

[Series 7: T2 fat-sat · axial · 6.0mm · 1.19mm/px · 1 of 15 slices shown (1 of 2)]
[im 1/15]
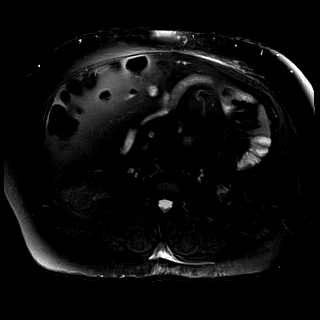

[Series 8: T2 fat-sat · axial · 6.0mm · 1.19mm/px · z∈[-140,+68]mm · 3 of 30 slices shown (2 of 2)]
[im 1/30]
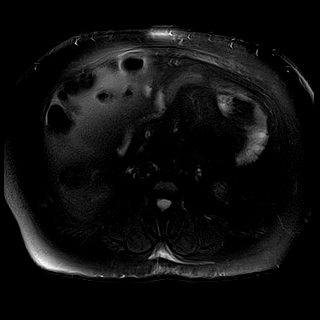
[im 15/30]
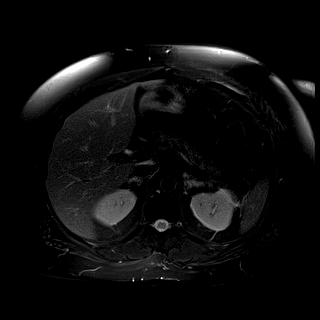
[im 30/30]
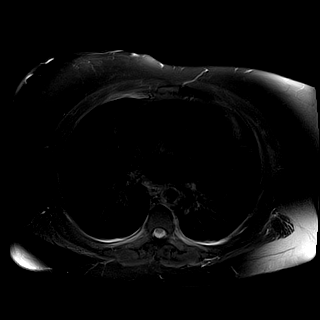

[Series 9: DWI · axial · 6.0mm · 1.42mm/px · z∈[-140,+68]mm · 3 of 30 slices shown (1 of 4)]
[im 1/30]
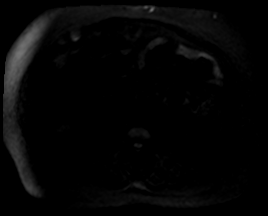
[im 15/30]
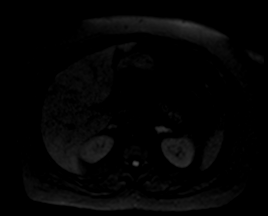
[im 30/30]
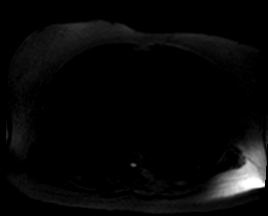

[Series 9: DWI · axial · 6.0mm · 1.42mm/px · z∈[-140,+68]mm · 3 of 30 slices shown (2 of 4)]
[im 1/30]
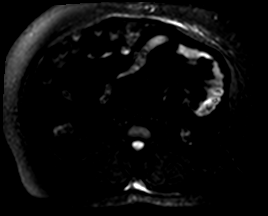
[im 15/30]
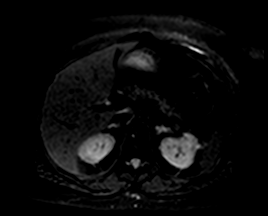
[im 30/30]
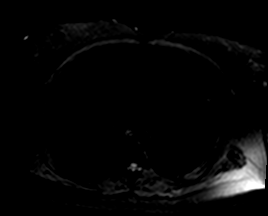

[Series 9: DWI · axial · 6.0mm · 1.42mm/px · z∈[-140,+68]mm · 3 of 30 slices shown (3 of 4)]
[im 1/30]
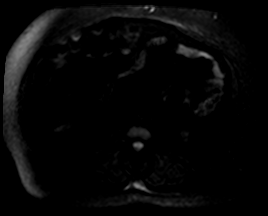
[im 15/30]
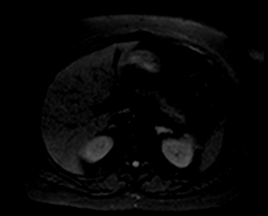
[im 30/30]
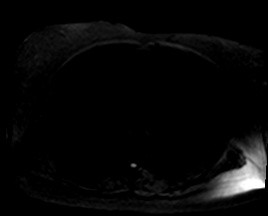

[Series 10: DWI · axial · 6.0mm · 1.42mm/px · z∈[-140,+68]mm · 3 of 30 slices shown (4 of 4)]
[im 1/30]
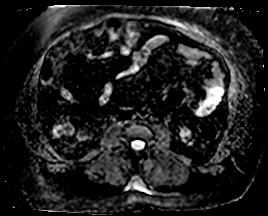
[im 15/30]
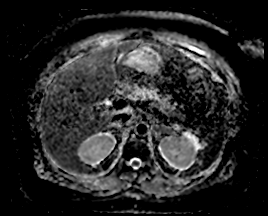
[im 30/30]
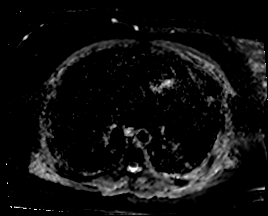

[Series 11: ax in and · axial · 3.0mm · 0.74mm/px · z∈[-142,+70]mm · 7 of 72 slices shown (1 of 2)]
[im 1/72]
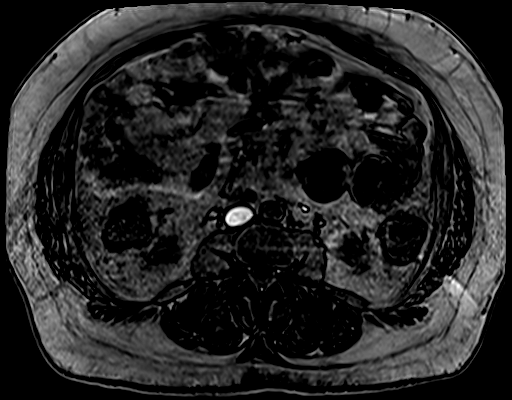
[im 12/72]
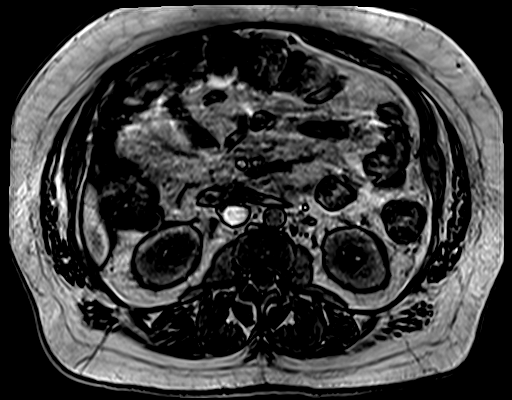
[im 24/72]
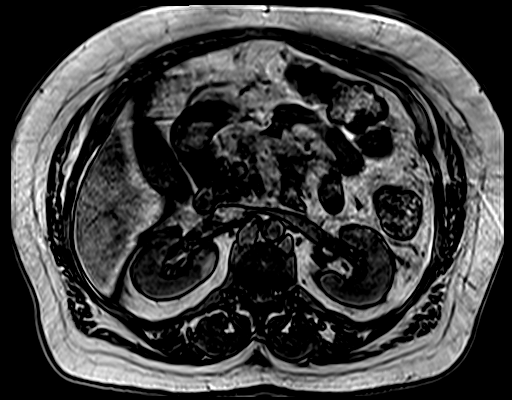
[im 36/72]
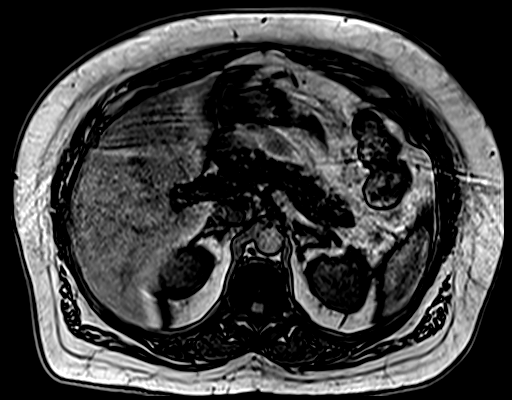
[im 48/72]
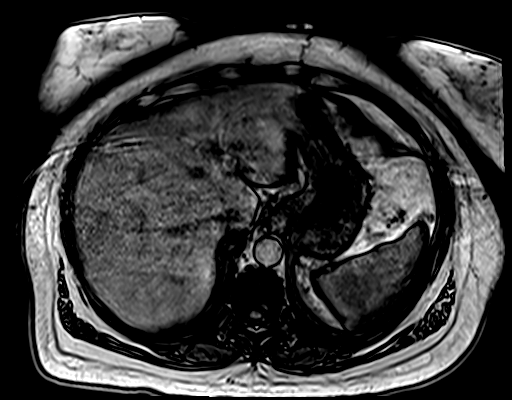
[im 60/72]
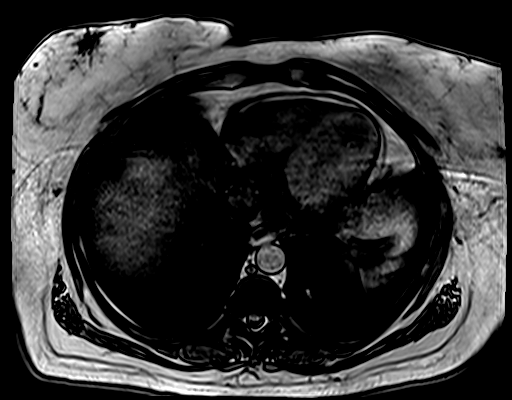
[im 72/72]
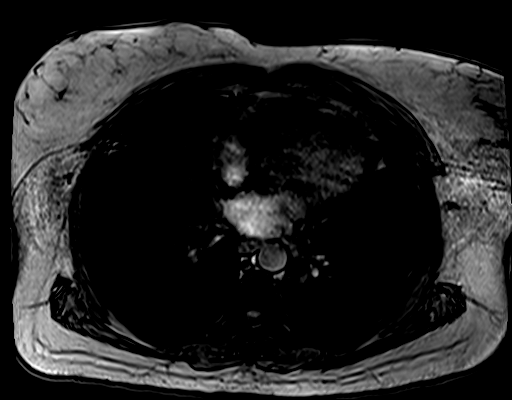

[Series 12: ax in and · axial · 3.0mm · 0.74mm/px · z∈[-142,+70]mm · 7 of 72 slices shown (2 of 2)]
[im 1/72]
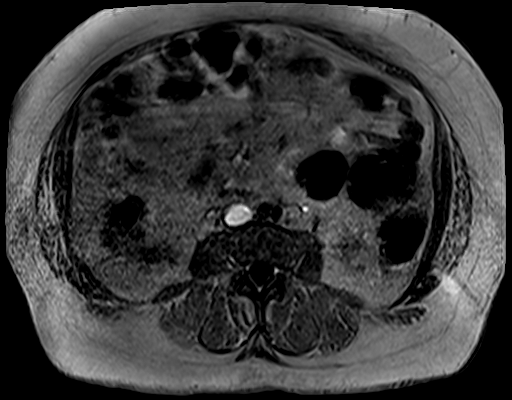
[im 12/72]
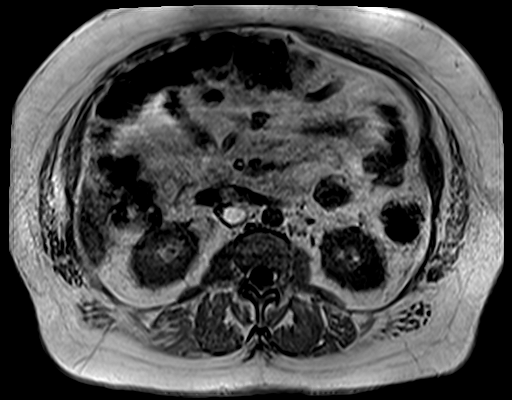
[im 24/72]
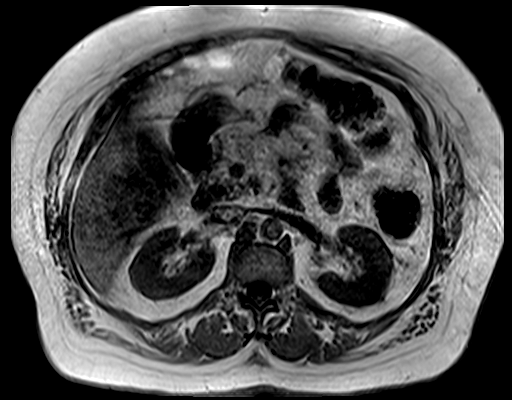
[im 36/72]
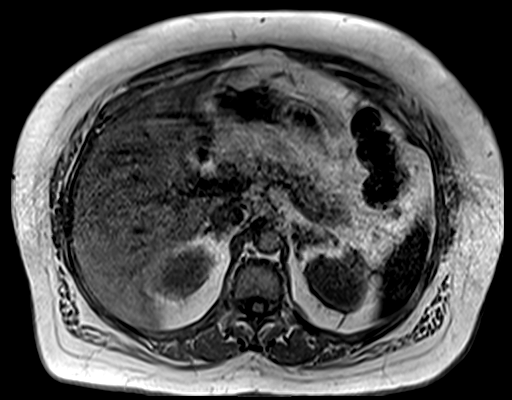
[im 48/72]
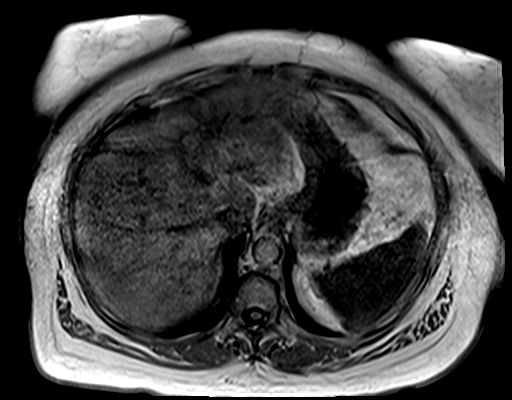
[im 60/72]
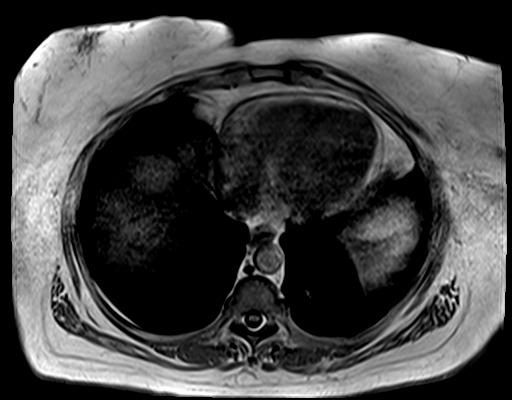
[im 72/72]
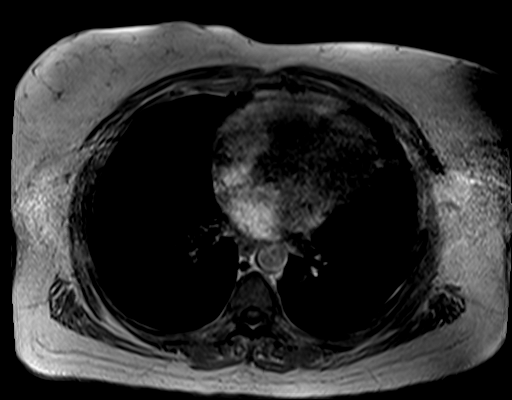

[35 of 48 positions shown; findings below may reference images not displayed]

FINDINGS: Despite efforts by the technologist and patient, motion artifact is
present on today's exam and could not be eliminated. This reduces
exam sensitivity and specificity.

Lower chest: Unremarkable

Hepatobiliary: The small dependent gallstone in the gallbladder was
better seen at CT. No biliary dilatation. No discrete filling defect
in the common bile duct which measures 0.5 cm in diameter and which
demonstrates normal conical tapering. No significant focal liver
lesion identified.

Pancreas:  Unremarkable

Spleen:  Unremarkable

Adrenals/Urinary Tract:  Both adrenal glands appear normal.

0.9 cm fluid density lesion of the left mid kidney anteriorly on
image 21 series 4, probably a cyst. Similar 0.7 cm lesion of the
right kidney upper pole on image 25 series 5.

Stomach/Bowel: Unremarkable

Vascular/Lymphatic:  Aortoiliac atherosclerotic vascular disease.

Other: Uterine fibroids are observed on the coronal T2 weighted
images.

Musculoskeletal: Unremarkable
IMPRESSION: 1. No biliary dilatation or appreciable choledocholithiasis on
today's examination. The patient has known cholelithiasis based on
recent CT although previously seen gallstone is less conspicuous on
today's MRI.
2. Small fluid density renal lesions favoring cysts.
3.  Aortic Atherosclerosis ([AO]-[AO]).
4. Uterine fibroids.

## 2021-03-01 IMAGING — DX DG FOOT COMPLETE 3+V*R*
3 series · 3 of 3 positions shown · non-contrast
Comparison: None.

CLINICAL DATA: Swelling with yellow fluid

EXAM:
RIGHT FOOT COMPLETE - 3+ VIEW

[foot ap]
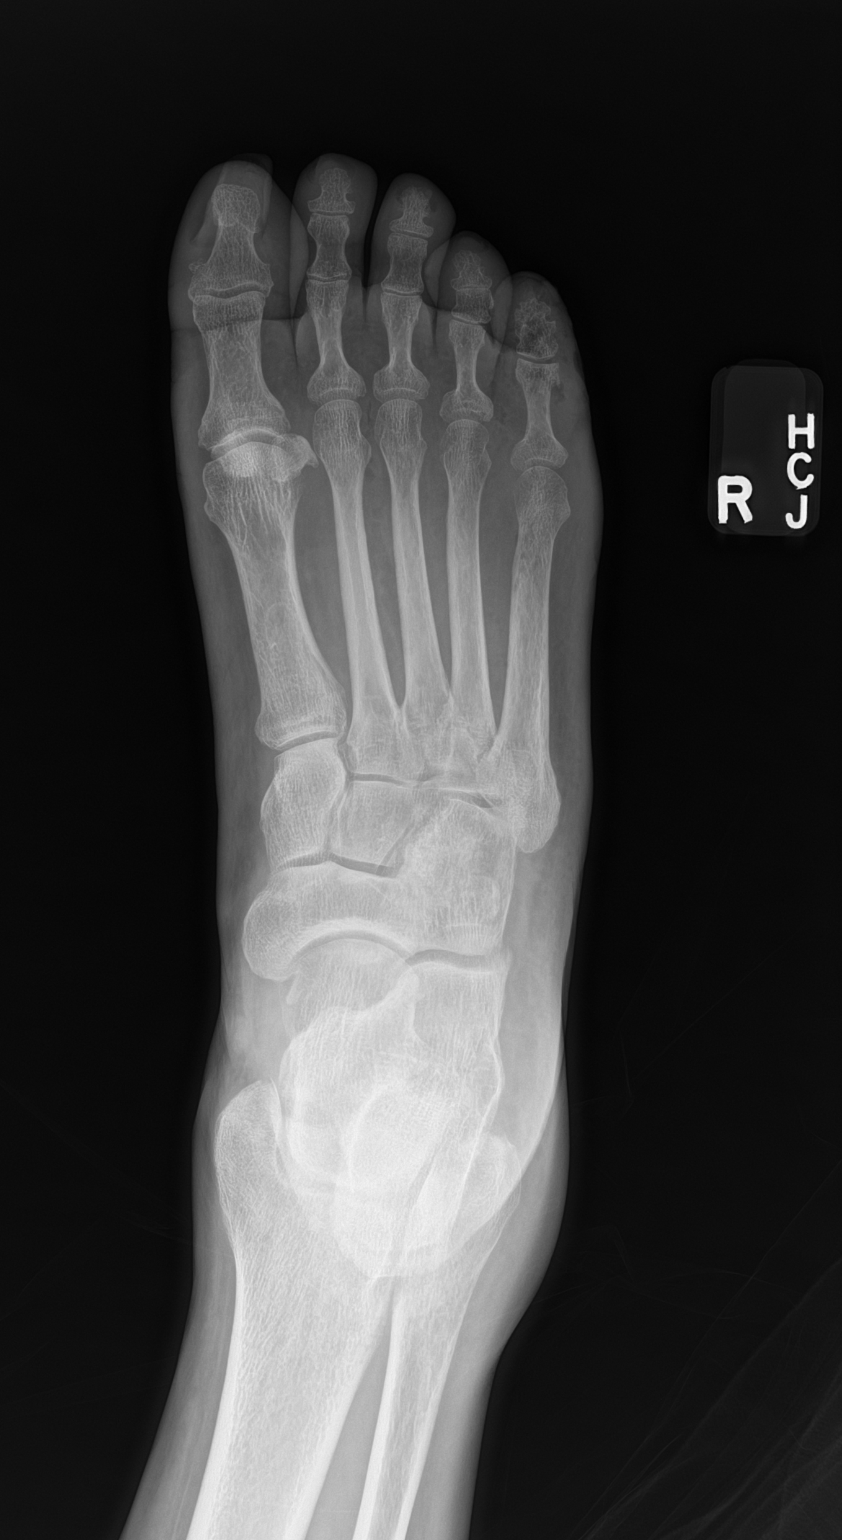

[foot obl]
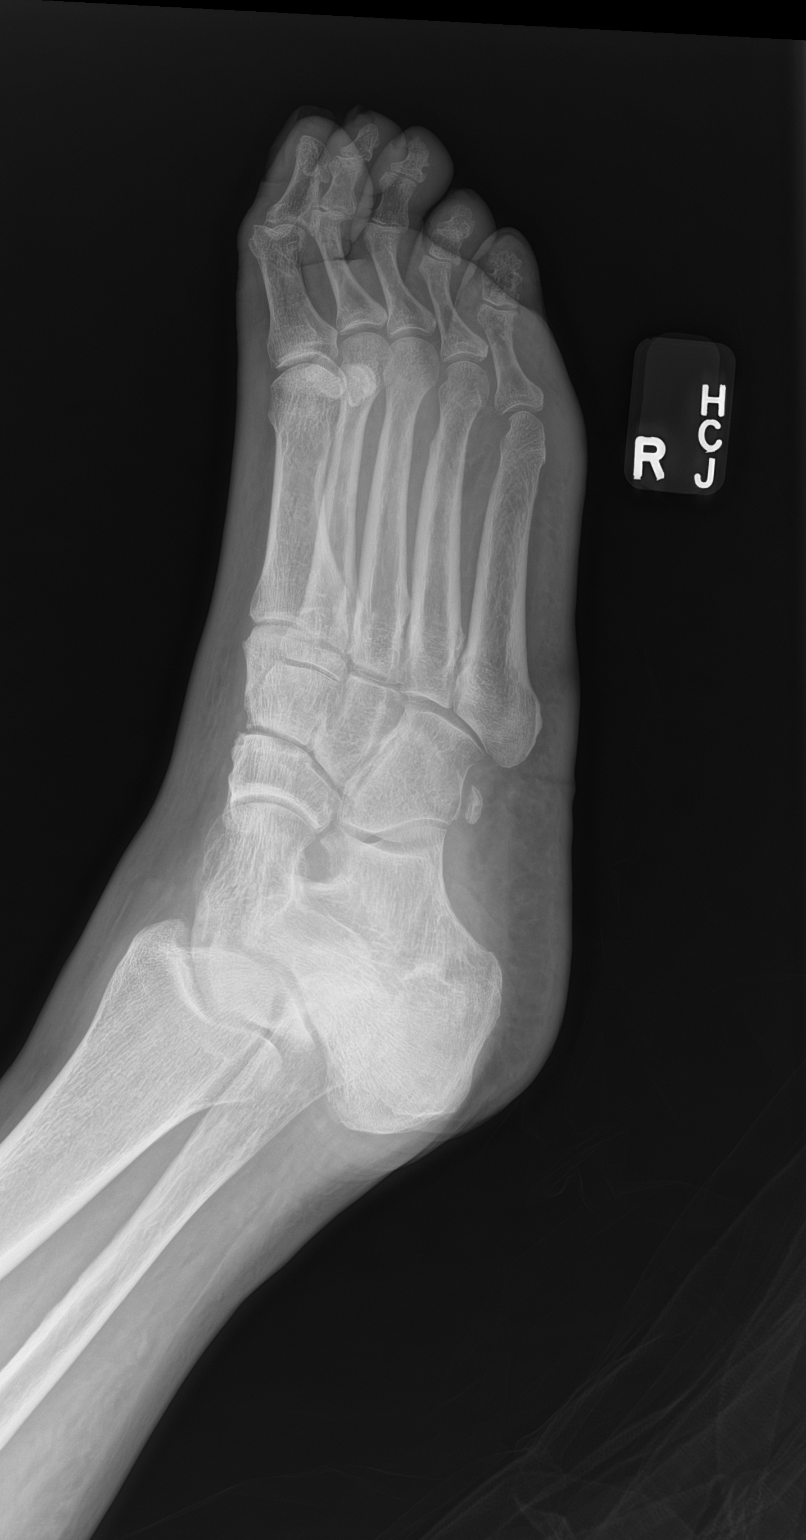

[foot lat]
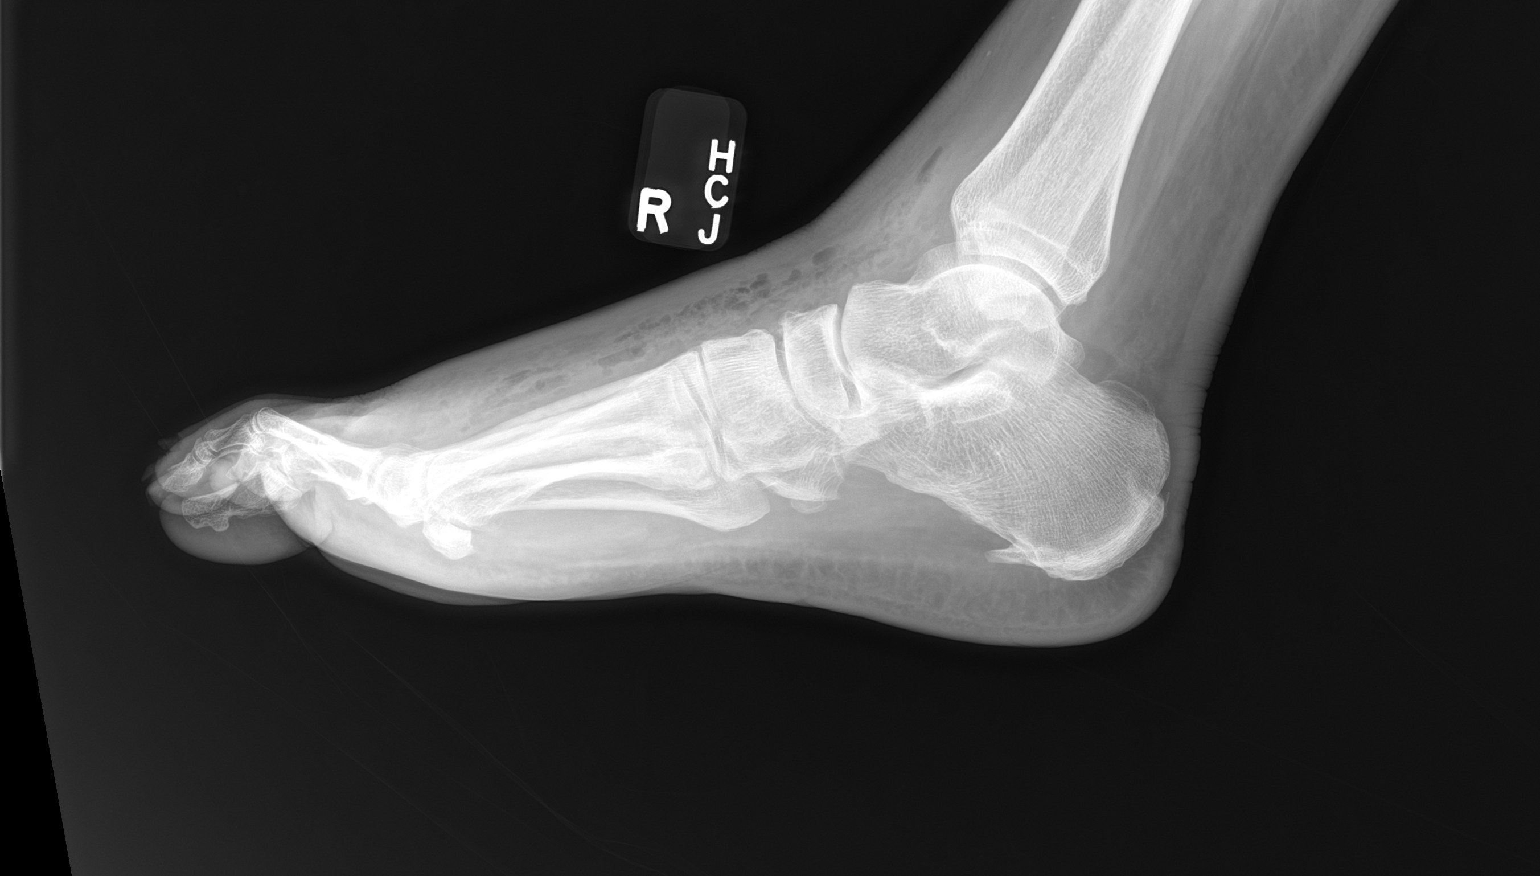

[3 of 3 positions shown; findings below may reference images not displayed]

FINDINGS: No fracture or malalignment. Moderate gas within the dorsal soft
tissues of the foot extending to the anterior ankle region
concerning for gas forming/necrotizing infection. Possible
ulceration of the fifth digit at the level of the IP joint. Possible
small erosion at the head of the fifth proximal phalanx.
IMPRESSION: 1. Soft tissue swelling. Gas within the soft tissues of the dorsum
of the foot and anterior to the ankle raises concern for gas
forming/necrotizing infection
2. Possible ulceration of the fifth digit. Suspect small cortical
erosion at the head of the fifth proximal phalanx raising concern
for osteomyelitis

These results will be called to the ordering clinician or
representative by the Radiologist Assistant, and communication
documented in the PACS or [REDACTED].

## 2021-03-01 MED ORDER — AMLODIPINE BESYLATE 5 MG PO TABS
10.0000 mg | ORAL_TABLET | Freq: Every day | ORAL | Status: DC
  Administered 2021-03-01 – 2021-03-08 (×8): 10 mg via ORAL
  Filled 2021-03-01 (×9): qty 2

## 2021-03-01 MED ORDER — ASPIRIN EC 81 MG PO TBEC
81.0000 mg | DELAYED_RELEASE_TABLET | Freq: Every day | ORAL | Status: DC
  Administered 2021-03-02 – 2021-03-08 (×6): 81 mg via ORAL
  Filled 2021-03-01 (×6): qty 1

## 2021-03-01 MED ORDER — VANCOMYCIN HCL IN DEXTROSE 1-5 GM/200ML-% IV SOLN: 1000.0000 mg | INTRAVENOUS | Status: DC

## 2021-03-01 MED ORDER — VANCOMYCIN HCL IN DEXTROSE 1-5 GM/200ML-% IV SOLN: 1000.0000 mg | Freq: Once | INTRAVENOUS | Status: DC

## 2021-03-01 MED ORDER — ACETAMINOPHEN 325 MG PO TABS
650.0000 mg | ORAL_TABLET | Freq: Four times a day (QID) | ORAL | Status: DC | PRN
  Administered 2021-03-01 – 2021-03-04 (×6): 650 mg via ORAL
  Filled 2021-03-01 (×6): qty 2

## 2021-03-01 MED ORDER — VANCOMYCIN HCL 1500 MG/300ML IV SOLN
1500.0000 mg | Freq: Once | INTRAVENOUS | Status: AC
  Administered 2021-03-01: 1500 mg via INTRAVENOUS
  Filled 2021-03-01: qty 300

## 2021-03-01 MED ORDER — LABETALOL HCL 5 MG/ML IV SOLN: 10.0000 mg | INTRAVENOUS | Status: DC | PRN

## 2021-03-01 MED ORDER — ROSUVASTATIN CALCIUM 20 MG PO TABS
40.0000 mg | ORAL_TABLET | Freq: Every day | ORAL | Status: DC
  Administered 2021-03-01 – 2021-03-03 (×3): 40 mg via ORAL
  Filled 2021-03-01 (×3): qty 2

## 2021-03-01 MED ORDER — METRONIDAZOLE 500 MG/100ML IV SOLN
500.0000 mg | Freq: Three times a day (TID) | INTRAVENOUS | Status: DC
  Administered 2021-03-01 – 2021-03-03 (×6): 500 mg via INTRAVENOUS
  Filled 2021-03-01 (×6): qty 100

## 2021-03-01 NOTE — Progress Notes (Signed)
Inpatient Diabetes Program Recommendations  AACE/ADA: New Consensus Statement on Inpatient Glycemic Control (2015)  Target Ranges:  Prepandial:   less than 140 mg/dL      Peak postprandial:   less than 180 mg/dL (1-2 hours)      Critically ill patients:  140 - 180 mg/dL   Lab Results  Component Value Date   GLUCAP 221 (H) 03/01/2021   HGBA1C 12.4 08/12/2020    Review of Glycemic Control  Diabetes history: DM2 Outpatient Diabetes medications: Lantus 60 units BID, Novolog 10-16 units TID, Jardiance 10 mg QD Current orders for Inpatient glycemic control: Lantus 60 units QD, Novolog 0-9 Q4H  Need updated HgbA1C - pending  Inpatient Diabetes Program Recommendations:    Increase Lantus to 45 units BID Add Novolog 5 units TID with meals if eating > 50%  Follow glucose trends.   Thank you. Lorenda Peck, RD, LDN, CDE Inpatient Diabetes Coordinator [phone]

## 2021-03-01 NOTE — Progress Notes (Signed)
Pharmacy Antibiotic Note  Nicole Golden is a 60 y.o. female admitted on 02/28/2021 with cellulitis.  Pharmacy has been consulted for Vancomycin dosing.  Plan: Vancomycin 1500mg  x 1 then 1000mg  IV Q 24 hrs. Goal AUC 400-550. Expected AUC: 506.9 SCr used: 1.77   Height: 5\' 8"  (172.7 cm) Weight: 84.7 kg (186 lb 11.7 oz) IBW/kg (Calculated) : 63.9  Temp (24hrs), Avg:100 F (37.8 C), Min:98.7 F (37.1 C), Max:102.7 F (39.3 C)  Recent Labs  Lab 02/26/21 2000 02/28/21 1305 02/28/21 1917 03/01/21 0624  WBC 11.2* 11.9*  --  10.1  CREATININE 1.68* 1.82* 1.66* 1.77*    Estimated Creatinine Clearance: 38 mL/min (A) (by C-G formula based on SCr of 1.77 mg/dL (H)).    No Known Allergies  Antimicrobials this admission: Vanc 7/5 >>   Dose adjustments this admission:   Microbiology results:  BCx: pending  UCx: pending   Sputum: pending   MRSA PCR: pending  Thank you for allowing pharmacy to be a part of this patient's care.  Hart Robinsons A 03/01/2021 4:30 PM

## 2021-03-01 NOTE — Progress Notes (Signed)
Patient had episode of coughing, gagging and spitting up mucus after trying to take her medication for pain.  She said it doesn't feel like its going down her trachea; it comes back up her esophagus.

## 2021-03-01 NOTE — Progress Notes (Signed)
Patient Demographics:    Nicole Golden, is a 59 y.o. female, DOB - August 27, 1961, EYE:[id number]  Admit date - 02/28/2021   Admitting Physician [name], MD  Outpatient Primary MD for the patient is [name], DO  LOS - 1   No chief complaint on file.       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 ---Rt   Type II diabetes mellitus with complication, uncontrolled (HCC)   Gallstones   Nausea & vomiting   Essential hypertension   Idiopathic acute pancreatitis without infection or necrosis  Brief Summary:- 60 year old with past medical history relevant for uncontrolled DM with peripheral neuropathy, HTN, HLD, CKD 3B, and chronic right small toe wound admitted on 02/28/2021 with sepsis secondary to right foot cellulitis after presenting  to the ED with malaise, fatigue, fevers, nausea and vomiting for the last 4 days - A/p 1)Sepsis Secondary to Right Foot Cellulitis/OsteoMyelits --POA--patient met sepsis criteria on admission with WBC of 11.9, fever of 102.7, tachycardia with heart rate of 110 tachypnea with respiratory rate of 22- -Right foot x-rays, right foot wound cultures and blood cultures as well as ESR and CRP ordered and pending --IV vancomycin per cellulitis order set -We will add Flagyl in this  diabetic female with foul-smelling wound -General surgery consult for possible amputation of the right little toe-discussed with Dr Aviva Signs -Patient with significant right foot peripheral  neuropathy in the setting of uncontrolled DM--- despite probing the wound and obtaining wound cultures patient had No foot pain  2)Cholelithiasis without cholecystitis--- LFTs noted -CT abdomen and pelvis and MRCP noted -Discussed with general surgeon, no need for inpatient intervention at this time -Patient may benefit from outpatient consultation for possible elective lap chole  Hepatic Function Latest Ref Rng & Units 03/01/2021 02/28/2021 02/26/2021  Total Protein 6.5 - 8.1 g/dL 5.9(L) 7.2 7.8  Albumin 3.5 - 5.0 g/dL 2.3(L) 3.0(L) 3.4(L)  AST 15 - 41 U/L 59(H) 39 24  ALT 0 - 44 U/L 42 38 25  Alk Phosphatase 38 - 126 U/L 81 88 82  Total Bilirubin 0.3 - 1.2 mg/dL 0.7 1.0 1.1  Bilirubin, Direct 0.0 - 0.2 mg/dL 0.2 - -   3)Acute pancreatitis with nausea and vomiting and abdominal pain---- -=patient with H/o  idiopathic pancreatitis, -Lipase is up to73, it was 20 on 02/27/2021  no EtOH use  - Has nausea and vomiting----secondary to #1 and #2 above -Improved with IV fluids and IV antiemetics -Okay to advance diet as tolerated  -Abdominal imaging studies as noted above in #2 without acute findings  4)Aki on CKD stage - 3B -creatinine is1.77, creatinine was 1.45 on 02/07/2021 --- renally adjust medications, avoid nephrotoxic agents / dehydration  / hypotension -Hydrate and watch renal function closely especially with IV vancomycin use  5)HTN--BPs not at goal, increase amlodipine to 10 mg daily, --  hold Lasix, hold losartan and HCTZ due to sepsis and AKI -May use IV labetalol as needed elevated BP  6)DM2--A1c previously greater than 12 reflecting uncontrolled DM with hyperglycemia -Repeat A1c pending -Continue Lantus insulin Use Novolog/Humalog Sliding scale insulin with Accu-Cheks/Fingersticks as ordered   7) chronic anemia most likely secondary to CKD--- hemoglobin is down to 9.3 from a baseline usually above 11 --No evidence of ongoing bleeding -Anemia work-up/labs including stool occult  blood ordered and pending -May benefit from ESA/EPO agent intermittently  Disposition/Need for in-Hospital Stay- patient unable to be discharged at this time due to sepsis secondary to right foot cellulitis and abscess in a diabetic patient requiring IV antibiotics and IVF  Status is: Inpatient  Remains inpatient appropriate because: See disposition above  Disposition: The patient is from: Home              Anticipated d/c is to: Home              Anticipated d/c date is: 3 days              Patient currently is not medically stable to d/c. Barriers: Not Clinically Stable-   Code Status : -  Code Status: Full Code   Family Communication:   (patient is alert, awake and coherent)  Consults  :  Gen surgery  DVT Prophylaxis  :   - SCDs  Place and maintain sequential compression device Start: 02/28/21 1850    Lab Results  Component Value Date   PLT 226 03/01/2021    Inpatient Medications  Scheduled Meds:  amLODipine  10 mg Oral Daily   [START ON 03/02/2021] aspirin EC  81 mg Oral Q breakfast   insulin aspart  0-9 Units Subcutaneous Q4H   insulin glargine  60 Units Subcutaneous QHS   rosuvastatin  40 mg Oral Daily   Continuous Infusions:  lactated ringers 150 mL/hr at 03/01/21 0705   [START ON 03/02/2021] vancomycin     vancomycin     PRN Meds:.acetaminophen, hydrALAZINE, labetalol, ondansetron **OR** ondansetron (ZOFRAN) IV    Anti-infectives (From admission, onward)    Start     Dose/Rate Route Frequency Ordered Stop   03/02/21 1800  vancomycin (VANCOCIN) IVPB 1000 mg/200 mL premix        1,000 mg 200 mL/hr over 60 Minutes Intravenous Every 24 hours 03/01/21 1630     03/01/21 1730  vancomycin (VANCOREADY) IVPB 1500 mg/300 mL        1,500 mg 150 mL/hr over 120 Minutes Intravenous  Once 03/01/21 1624     03/01/21 1715  vancomycin (VANCOCIN) IVPB 1000 mg/200 mL premix  Status:  Discontinued        1,000 mg 200 mL/hr over 60 Minutes Intravenous  Once 03/01/21 1622  03/01/21 1623         Objective:   Vitals:   03/01/21 0147 03/01/21 0350 03/01/21 0403 03/01/21 1403  BP: (!) 139/56 (!) 137/52  (!) 153/56  Pulse: (!) 108 94  100  Resp: _0 Temp: (!) 102.7 F (39.3 C) 98.8 F (37.1 C)  98.7 F (37.1 C)  TempSrc: Oral Oral    SpO2: 100% 100%  100%  Weight:   84.7 kg   Height:        Wt Readings from Last 3 Encounters:  03/01/21 84.7 kg  02/12/21 86.2 kg  02/07/21 86.2 kg     Intake/Output Summary (Last 24 hours) at 03/01/2021 1642 Last data filed at 03/01/2021  1300 Gross per 24 hour  Intake 2250.33 ml  Output 1 ml  Net 2249.33 ml     Physical Exam  Gen:- Awake Alert,  in no apparent distress  HEENT:- [place].AT, No sclera icterus Neck-Supple Neck,No JVD,.  Lungs-  CTAB , fair symmetrical air movement CV- S1, S2 normal, regular  Abd-  +ve B.Sounds, Abd Soft, upper quadrant and epigastric area discomfort without rebound or guarding Extremity/Skin:-  pedal pulses present  Psych-affect is appropriate, oriented x3 Neuro-no new focal deficits, no tremors MSK--- significant right foot swelling, right third toe with open wound with purulent foul-smelling drainage, there is erythema and warmth -patient with significant right foot peripheral neuropathy in the setting of uncontrolled DM--- despite probing the wound and obtaining wound cultures patient had No foot pain   Data Review:   Micro Results Recent Results (from the past 240 hour(s))  SARS CORONAVIRUS 2 (TAT 6-24 HRS) Nasopharyngeal Nasopharyngeal Swab     Status: None   Collection Time: 02/28/21  8:58 PM   Specimen: Nasopharyngeal Swab  Result Value Ref Range Status   SARS Coronavirus 2 NEGATIVE NEGATIVE Final    Comment: (NOTE) SARS-CoV-2 target nucleic acids are NOT DETECTED.  The SARS-CoV-2 RNA is generally detectable in upper and lower respiratory specimens during the acute phase of infection. Negative results do not preclude SARS-CoV-2 infection, do not rule  out co-infections with other pathogens, and should not be used as the sole basis for treatment or other patient management decisions. Negative results must be combined with clinical observations, patient history, and epidemiological information. The expected result is Negative.  Fact Sheet for Patients: SugarRoll.be  Fact Sheet for Healthcare Providers: https://www.woods-mathews.com/  This test is not yet approved or cleared by the [place] FDA and  has been authorized for detection and/or diagnosis of SARS-CoV-2 by FDA under an Emergency Use Authorization (EUA). This EUA will remain  in effect (meaning this test can be used) for the duration of the COVID-19 declaration under Se ction 564(b)(1) of the Act, 21 U.S.C. section 360bbb-3(b)(1), unless the authorization is terminated or revoked sooner.  Performed at [facility], [place] [address], [place], [place] [address]     Radiology Reports CT ABDOMEN PELVIS WO CONTRAST  Result Date: 02/28/2021 CLINICAL DATA:  Nonlocalized acute abdominal pain. EXAM: CT ABDOMEN AND PELVIS WITHOUT CONTRAST TECHNIQUE: Multidetector CT imaging of the abdomen and pelvis was performed following the standard protocol without IV contrast. COMPARISON:  Ultrasound pelvis 02/04/2018. FINDINGS: Lower chest: No acute abnormality. Hepatobiliary: No focal liver abnormality. Tiny calcified stones noted within the gallbladder lumen. No gallbladder wall thickening or pericholecystic fluid. No biliary dilatation. Pancreas: No focal lesion. Slight hazy pancreatic contour likely due to motion artifact. Otherwise normal pancreatic contour. No surrounding inflammatory changes. No main pancreatic ductal dilatation. Spleen: Normal in size without focal abnormality. A splenule is noted. Adrenals/Urinary Tract: No adrenal nodule bilaterally. Bilateral kidneys enhance symmetrically. No hydronephrosis. No hydroureter. The urinary  bladder is unremarkable. Stomach/Bowel: Stomach is within normal limits. No evidence of bowel wall thickening or dilatation. Appendix appears normal. Vascular/Lymphatic: No abdominal aorta or iliac aneurysm. At least moderate atherosclerotic plaque of the aorta and its branches. No abdominal, pelvic, or inguinal lymphadenopathy. Reproductive: Redemonstration of a coarsely calcified left subserosal fibroid measuring up to at least 5.5 cm. A central coarsely calcified or in lesion is also noted likely representing known submucosal uterine fibroid. Bilateral adnexa are otherwise unremarkable. Other: No intraperitoneal free fluid. No intraperitoneal free gas. No organized  fluid collection. Musculoskeletal: No abdominal wall hernia or abnormality. No suspicious lytic or blastic osseous lesions. No acute displaced fracture. IMPRESSION: 1. Slight hazy pancreatic contour likely due to motion artifact. Differential diagnosis includes acute pancreatitis. Recommend correlation with lipase levels. 2. Cholelithiasis no CT findings of acute cholecystitis. 3. Subserosal and submucosal degenerative uterine fibroids. 4. Please note limited evaluation on this noncontrast study. Electronically Signed   By: Iven Finn M.D.   On: 02/28/2021 17:06   MR ABDOMEN MRCP WO CONTRAST  Result Date: 03/01/2021 CLINICAL DATA:  Abdominal pain.  Cholelithiasis. EXAM: MRI ABDOMEN WITHOUT CONTRAST  (INCLUDING MRCP) TECHNIQUE: Multiplanar multisequence MR imaging of the abdomen was performed. Heavily T2-weighted images of the biliary and pancreatic ducts were obtained, and three-dimensional MRCP images were rendered by post processing. COMPARISON:  CT abdomen 02/28/2021 FINDINGS: Despite efforts by the technologist and patient, motion artifact is present on today's exam and could not be eliminated. This reduces exam sensitivity and specificity. Lower chest: Unremarkable Hepatobiliary: The small dependent gallstone in the gallbladder was better  seen at CT. No biliary dilatation. No discrete filling defect in the common bile duct which measures 0.5 cm in diameter and which demonstrates normal conical tapering. No significant focal liver lesion identified. Pancreas:  Unremarkable Spleen:  Unremarkable Adrenals/Urinary Tract:  Both adrenal glands appear normal. 0.9 cm fluid density lesion of the left mid kidney anteriorly on image 21 series 4, probably a cyst. Similar 0.7 cm lesion of the right kidney upper pole on image 25 series 5. Stomach/Bowel: Unremarkable Vascular/Lymphatic:  Aortoiliac atherosclerotic vascular disease. Other: Uterine fibroids are observed on the coronal T2 weighted images. Musculoskeletal: Unremarkable IMPRESSION: 1. No biliary dilatation or appreciable choledocholithiasis on today's examination. The patient has known cholelithiasis based on recent CT although previously seen gallstone is less conspicuous on today's MRI. 2. Small fluid density renal lesions favoring cysts. 3.  Aortic Atherosclerosis (ICD10-I70.0). 4. Uterine fibroids. Electronically Signed   By: Van Clines M.D.   On: 03/01/2021 10:47   DG Abd Acute W/Chest  Result Date: 02/28/2021 CLINICAL DATA:  Vomiting. EXAM: DG ABDOMEN ACUTE WITH 1 VIEW CHEST COMPARISON:  None. FINDINGS: There is no evidence of dilated bowel loops or free intraperitoneal air. Calcified uterine fibroid is noted in the pelvis. Heart size and mediastinal contours are within normal limits. Both lungs are clear. IMPRESSION: No abnormal bowel dilatation.  No acute cardiopulmonary disease. Electronically Signed   By: Marijo Conception M.D.   On: 02/28/2021 13:39     CBC Recent Labs  Lab 02/26/21 2000 02/28/21 1305 03/01/21 0624  WBC 11.2* 11.9* 10.1  HGB 11.2* 10.9* 9.3*  HCT 35.3* 34.6* 29.3*  PLT 240 242 226  MCV 81.1 81.4 80.5  MCH 25.7* 25.6* 25.5*  MCHC 31.7 31.5 31.7  RDW 13.4 13.9 14.0  LYMPHSABS  --  1.1 1.2  MONOABS  --  0.9 0.7  EOSABS  --  0.0 0.1  BASOSABS  --  0.0  0.0    Chemistries  Recent Labs  Lab 02/26/21 2000 02/28/21 1305 02/28/21 1917 03/01/21 0624  NA 129* 133* 134* 136  K 4.5 4.0 3.7 3.5  CL 94* 101 104 105  CO2 20* 19* 20* 21*  GLUCOSE 603* 441* 400* 253*  BUN 40* 40* 38* 38*  CREATININE 1.68* 1.82* 1.66* 1.77*  CALCIUM 9.0 8.8* 8.5* 8.3*  AST 24 39  --  59*  ALT 25 38  --  42  ALKPHOS 82 88  --  81  BILITOT 1.1 1.0  --  0.7   ------------------------------------------------------------------------------------------------------------------ No results for input(s): CHOL, HDL, LDLCALC, TRIG, CHOLHDL, LDLDIRECT in the last 72 hours.  Lab Results  Component Value Date   HGBA1C 12.4 08/12/2020   ------------------------------------------------------------------------------------------------------------------ No results for input(s): TSH, T4TOTAL, T3FREE, THYROIDAB in the last 72 hours.  Invalid input(s): FREET3 ------------------------------------------------------------------------------------------------------------------ No results for input(s): VITAMINB12, FOLATE, FERRITIN, TIBC, IRON, RETICCTPCT in the last 72 hours.  Coagulation profile No results for input(s): INR, PROTIME in the last 168 hours.  No results for input(s): DDIMER in the last 72 hours.  Cardiac Enzymes No results for input(s): CKMB, TROPONINI, MYOGLOBIN in the last 168 hours.  Invalid input(s): CK ------------------------------------------------------------------------------------------------------------------ No results found for: BNP   Roxan Hockey M.D on 03/01/2021 at 4:42 PM  Go to [email or web address] - for contact info  [facility] - Office  [phone]

## 2021-03-02 ENCOUNTER — Inpatient Hospital Stay (HOSPITAL_COMMUNITY): Payer: Medicare HMO

## 2021-03-02 DIAGNOSIS — I96 Gangrene, not elsewhere classified: Secondary | ICD-10-CM

## 2021-03-02 LAB — COMPREHENSIVE METABOLIC PANEL
ALT: 86 U/L — ABNORMAL HIGH (ref 0–44)
AST: 138 U/L — ABNORMAL HIGH (ref 15–41)
Albumin: 2.3 g/dL — ABNORMAL LOW (ref 3.5–5.0)
Alkaline Phosphatase: 139 U/L — ABNORMAL HIGH (ref 38–126)
Anion gap: 9 (ref 5–15)
BUN: 28 mg/dL — ABNORMAL HIGH (ref 8–23)
CO2: 22 mmol/L (ref 22–32)
Calcium: 8.4 mg/dL — ABNORMAL LOW (ref 8.9–10.3)
Chloride: 104 mmol/L (ref 98–111)
Creatinine, Ser: 1.36 mg/dL — ABNORMAL HIGH (ref 0.44–1.00)
GFR, Estimated: 44 mL/min — ABNORMAL LOW (ref >60)
Glucose, Bld: 207 mg/dL — ABNORMAL HIGH (ref 70–99)
Potassium: 3.2 mmol/L — ABNORMAL LOW (ref 3.5–5.1)
Sodium: 135 mmol/L (ref 135–145)
Total Bilirubin: 0.8 mg/dL (ref 0.3–1.2)
Total Protein: 6.4 g/dL — ABNORMAL LOW (ref 6.5–8.1)

## 2021-03-02 LAB — CBC
HCT: 31.1 % — ABNORMAL LOW (ref 36.0–46.0)
Hemoglobin: 9.7 g/dL — ABNORMAL LOW (ref 12.0–15.0)
MCH: 25.1 pg — ABNORMAL LOW (ref 26.0–34.0)
MCHC: 31.2 g/dL (ref 30.0–36.0)
MCV: 80.6 fL (ref 80.0–100.0)
Platelets: 281 10*3/uL (ref 150–400)
RBC: 3.86 MIL/uL — ABNORMAL LOW (ref 3.87–5.11)
RDW: 14.1 % (ref 11.5–15.5)
WBC: 11.3 10*3/uL — ABNORMAL HIGH (ref 4.0–10.5)
nRBC: 0 % (ref 0.0–0.2)

## 2021-03-02 LAB — GLUCOSE, CAPILLARY
Glucose-Capillary: 169 mg/dL — ABNORMAL HIGH (ref 70–99)
Glucose-Capillary: 189 mg/dL — ABNORMAL HIGH (ref 70–99)
Glucose-Capillary: 190 mg/dL — ABNORMAL HIGH (ref 70–99)
Glucose-Capillary: 190 mg/dL — ABNORMAL HIGH (ref 70–99)
Glucose-Capillary: 191 mg/dL — ABNORMAL HIGH (ref 70–99)
Glucose-Capillary: 202 mg/dL — ABNORMAL HIGH (ref 70–99)
Glucose-Capillary: 218 mg/dL — ABNORMAL HIGH (ref 70–99)

## 2021-03-02 LAB — IRON AND TIBC
Iron: 14 ug/dL — ABNORMAL LOW (ref 28–170)
Saturation Ratios: 9 % — ABNORMAL LOW (ref 10.4–31.8)
TIBC: 164 ug/dL — ABNORMAL LOW (ref 250–450)
UIBC: 150 ug/dL

## 2021-03-02 LAB — HEMOGLOBIN A1C
Hgb A1c MFr Bld: 13.1 % — ABNORMAL HIGH (ref 4.8–5.6)
Mean Plasma Glucose: 329 mg/dL

## 2021-03-02 LAB — FERRITIN: Ferritin: 969 ng/mL — ABNORMAL HIGH (ref 11–307)

## 2021-03-02 LAB — FOLATE: Folate: 9.8 ng/mL (ref >5.9)

## 2021-03-02 LAB — VITAMIN B12: Vitamin B-12: 1334 pg/mL — ABNORMAL HIGH (ref 180–914)

## 2021-03-02 MED ORDER — CHLORHEXIDINE GLUCONATE CLOTH 2 % EX PADS
6.0000 | MEDICATED_PAD | Freq: Once | CUTANEOUS | Status: AC
  Administered 2021-03-02: 6 via TOPICAL

## 2021-03-02 MED ORDER — VANCOMYCIN HCL 1250 MG/250ML IV SOLN
1250.0000 mg | INTRAVENOUS | Status: DC
  Administered 2021-03-02 – 2021-03-05 (×4): 1250 mg via INTRAVENOUS
  Filled 2021-03-02 (×4): qty 250

## 2021-03-02 MED ORDER — POTASSIUM CHLORIDE 10 MEQ/100ML IV SOLN
10.0000 meq | INTRAVENOUS | Status: AC
  Administered 2021-03-02 (×3): 10 meq via INTRAVENOUS
  Filled 2021-03-02 (×3): qty 100

## 2021-03-02 NOTE — H&P (View-Only) (Signed)
Reason for Consult: Cellulitis of right fifth toe Referring Physician: Dr. Gypsy Lore GAYNELL EGGLETON is an 60 y.o. female.  HPI: Patient is a 60 year old white female who was admitted to the hospital on 02/28/2021 with worsening nausea and vomiting.  She was noted to have pancreatitis with evidence of possible cholelithiasis.  She was admitted in the hospital for work-up.  She was also noted to have uncontrolled diabetes mellitus.  An MRCP was performed which revealed no choledocholithiasis and a possible gallstone.  She has also been found on physical examination to have cellulitis of the right fifth toe with extension into the foot.  Plain x-rays done yesterday revealed some gas tracking into the forefoot.  Patient states that her foot has been sore over the past few days.  She has been no exactly when it started feeling bad.  She denies any trauma to the foot.  Past Medical History:  Diagnosis Date   Diabetes mellitus without complication (HCC)    Diabetic neuropathy ([place])    Diabetic neuropathy ([place])    Hypertension    Hypertensive retinopathy    OU    Past Surgical History:  Procedure Laterality Date   CATARACT EXTRACTION Bilateral    EYE SURGERY Bilateral    Cat Sx   FOOT SURGERY     right foot   TUBAL LIGATION      Family History  Problem Relation Age of Onset   Breast cancer Paternal Aunt    Diabetes Father    Colon cancer Neg Hx    Colon polyps Neg Hx     Social History:  reports that she has quit smoking. She has never used smokeless tobacco. She reports current alcohol use. She reports that she does not use drugs.  Allergies: No Known Allergies  Medications: Scheduled:  amLODipine  10 mg Oral Daily   aspirin EC  81 mg Oral Q breakfast   insulin aspart  0-9 Units Subcutaneous Q4H   insulin glargine  60 Units Subcutaneous QHS   rosuvastatin  40 mg Oral q1800    Results for orders placed or performed during the hospital encounter of 02/28/21 (from the past 48 hour(s))   CBG monitoring, ED     Status: Abnormal   Collection Time: 02/28/21  6:58 PM  Result Value Ref Range   Glucose-Capillary 386 (H) 70 - 99 mg/dL    Comment: Glucose reference range applies only to samples taken after fasting for at least 8 hours.  HIV Antibody (routine testing w rflx)     Status: None   Collection Time: 02/28/21  7:17 PM  Result Value Ref Range   HIV Screen 4th Generation wRfx Non Reactive Non Reactive    Comment: Performed at [facility], [place] [address] [place], [place] [address]  Basic metabolic panel     Status: Abnormal   Collection Time: 02/28/21  7:17 PM  Result Value Ref Range   Sodium 134 (L) 135 - 145 mmol/L   Potassium 3.7 3.5 - 5.1 mmol/L   Chloride 104 98 - 111 mmol/L   CO2 20 (L) 22 - 32 mmol/L   Glucose, Bld 400 (H) 70 - 99 mg/dL    Comment: Glucose reference range applies only to samples taken after fasting for at least 8 hours.   BUN 38 (H) 8 - 23 mg/dL   Creatinine, Ser 1.66 (H) 0.44 - 1.00 mg/dL   Calcium 8.5 (L) 8.9 - 10.3 mg/dL   GFR, Estimated 35 (L) >60  mL/min    Comment: (NOTE) Calculated using the CKD-EPI Creatinine Equation (2021)    Anion gap 10 5 - 15    Comment: Performed at [facility], [address], [place], [place] [address]  CBG monitoring, ED     Status: Abnormal   Collection Time: 02/28/21  8:01 PM  Result Value Ref Range   Glucose-Capillary 329 (H) 70 - 99 mg/dL    Comment: Glucose reference range applies only to samples taken after fasting for at least 8 hours.   Comment 1 Notify RN   SARS CORONAVIRUS 2 (TAT 6-24 HRS) Nasopharyngeal Nasopharyngeal Swab     Status: None   Collection Time: 02/28/21  8:58 PM   Specimen: Nasopharyngeal Swab  Result Value Ref Range   SARS Coronavirus 2 NEGATIVE NEGATIVE    Comment: (NOTE) SARS-CoV-2 target nucleic acids are NOT DETECTED.  The SARS-CoV-2 RNA is generally detectable in upper and lower respiratory specimens during the acute phase of infection. Negative results do not  preclude SARS-CoV-2 infection, do not rule out co-infections with other pathogens, and should not be used as the sole basis for treatment or other patient management decisions. Negative results must be combined with clinical observations, patient history, and epidemiological information. The expected result is Negative.  Fact Sheet for Patients: SugarRoll.be  Fact Sheet for Healthcare Providers: https://www.woods-mathews.com/  This test is not yet approved or cleared by the [place] FDA and  has been authorized for detection and/or diagnosis of SARS-CoV-2 by FDA under an Emergency Use Authorization (EUA). This EUA will remain  in effect (meaning this test can be used) for the duration of the COVID-19 declaration under Se ction 564(b)(1) of the Act, 21 U.S.C. section 360bbb-3(b)(1), unless the authorization is terminated or revoked sooner.  Performed at [facility], [place] [address], [place], [place] [address]   Glucose, capillary     Status: Abnormal   Collection Time: 03/01/21 12:05 AM  Result Value Ref Range   Glucose-Capillary 296 (H) 70 - 99 mg/dL    Comment: Glucose reference range applies only to samples taken after fasting for at least 8 hours.  Glucose, capillary     Status: Abnormal   Collection Time: 03/01/21  3:51 AM  Result Value Ref Range   Glucose-Capillary 248 (H) 70 - 99 mg/dL    Comment: Glucose reference range applies only to samples taken after fasting for at least 8 hours.  Hepatic function panel     Status: Abnormal   Collection Time: 03/01/21  6:24 AM  Result Value Ref Range   Total Protein 5.9 (L) 6.5 - 8.1 g/dL   Albumin 2.3 (L) 3.5 - 5.0 g/dL   AST 59 (H) 15 - 41 U/L   ALT 42 0 - 44 U/L   Alkaline Phosphatase 81 38 - 126 U/L   Total Bilirubin 0.7 0.3 - 1.2 mg/dL   Bilirubin, Direct 0.2 0.0 - 0.2 mg/dL   Indirect Bilirubin 0.5 0.3 - 0.9 mg/dL    Comment: Performed at [facility], [address], [place], [place] [address]  CBC WITH DIFFERENTIAL     Status: Abnormal   Collection Time: 03/01/21  6:24 AM  Result Value Ref Range   WBC 10.1 4.0 - 10.5 K/uL   RBC 3.64 (L) 3.87 - 5.11 MIL/uL   Hemoglobin 9.3 (L) 12.0 - 15.0 g/dL   HCT 29.3 (L) 36.0 - 46.0 %   MCV 80.5 80.0 - 100.0 fL   MCH 25.5 (L) 26.0 -  34.0 pg   MCHC 31.7 30.0 - 36.0 g/dL   RDW 14.0 11.5 - 15.5 %   Platelets 226 150 - 400 K/uL   nRBC 0.0 0.0 - 0.2 %   Neutrophils Relative % 78 %   Neutro Abs 7.9 (H) 1.7 - 7.7 K/uL   Lymphocytes Relative 12 %   Lymphs Abs 1.2 0.7 - 4.0 K/uL   Monocytes Relative 7 %   Monocytes Absolute 0.7 0.1 - 1.0 K/uL   Eosinophils Relative 1 %   Eosinophils Absolute 0.1 0.0 - 0.5 K/uL   Basophils Relative 0 %   Basophils Absolute 0.0 0.0 - 0.1 K/uL   Immature Granulocytes 2 %   Abs Immature Granulocytes 0.18 (H) 0.00 - 0.07 K/uL    Comment: Performed at [facility], [address] [place], [place] [address]  Basic metabolic panel     Status: Abnormal   Collection Time: 03/01/21  6:24 AM  Result Value Ref Range   Sodium 136 135 - 145 mmol/L   Potassium 3.5 3.5 - 5.1 mmol/L   Chloride 105 98 - 111 mmol/L   CO2 21 (L) 22 - 32 mmol/L   Glucose, Bld 253 (H) 70 - 99 mg/dL    Comment: Glucose reference range applies only to samples taken after fasting for at least 8 hours.   BUN 38 (H) 8 - 23 mg/dL   Creatinine, Ser 1.77 (H) 0.44 - 1.00 mg/dL   Calcium 8.3 (L) 8.9 - 10.3 mg/dL   GFR, Estimated 32 (L) >60 mL/min    Comment: (NOTE) Calculated using the CKD-EPI Creatinine Equation (2021)    Anion gap 10 5 - 15    Comment: Performed at [facility], [address] [place], [place] [address]  Glucose, capillary     Status: Abnormal   Collection Time: 03/01/21  7:15 AM  Result Value Ref Range   Glucose-Capillary 235 (H) 70 - 99 mg/dL    Comment: Glucose reference range applies only to samples taken after fasting for at least 8 hours.  Glucose, capillary     Status: Abnormal   Collection  Time: 03/01/21 11:09 AM  Result Value Ref Range   Glucose-Capillary 256 (H) 70 - 99 mg/dL    Comment: Glucose reference range applies only to samples taken after fasting for at least 8 hours.  Glucose, capillary     Status: Abnormal   Collection Time: 03/01/21  4:06 PM  Result Value Ref Range   Glucose-Capillary 211 (H) 70 - 99 mg/dL    Comment: Glucose reference range applies only to samples taken after fasting for at least 8 hours.  Aerobic/Anaerobic Culture w Gram Stain (surgical/deep wound)     Status: None (Preliminary result)   Collection Time: 03/01/21  4:20 PM   Specimen: Foot; Abscess  Result Value Ref Range   Specimen Description      FOOT Performed at [facility], [address], [name], [place] [address]    Special Requests      NONE Performed at [facility], [address] [place], [place] [address]    Gram Stain      RARE WBC PRESENT, PREDOMINANTLY PMN ABUNDANT GRAM POSITIVE COCCI    Culture      TOO YOUNG TO READ Performed at [facility], [place] [address], [place],  [address]    Report Status PENDING   Sedimentation rate     Status: Abnormal   Collection Time: 03/01/21  4:36 PM  Result Value Ref  Range   Sed Rate 130 (H) 0 - 22 mm/hr    Comment: Performed at [facility], [address], [place], [place] [address]  C-reactive protein     Status: Abnormal   Collection Time: 03/01/21  4:36 PM  Result Value Ref Range   CRP 43.8 (H) <1.0 mg/dL    Comment: Performed at [facility], [address], [place], [place] [address]  Culture, blood (Routine X 2) w Reflex to ID Panel     Status: None (Preliminary result)   Collection Time: 03/01/21  5:57 PM   Specimen: BLOOD RIGHT ARM  Result Value Ref Range   Specimen Description BLOOD RIGHT ARM    Special Requests      BOTTLES DRAWN AEROBIC AND ANAEROBIC Blood Culture adequate volume   Culture      NO GROWTH < 24 HOURS Performed at [facility], [address], [place], [place] [address]    Report  Status PENDING   Culture, blood (Routine X 2) w Reflex to ID Panel     Status: None (Preliminary result)   Collection Time: 03/01/21  5:57 PM   Specimen: BLOOD LEFT HAND  Result Value Ref Range   Specimen Description BLOOD LEFT HAND    Special Requests      BOTTLES DRAWN AEROBIC AND ANAEROBIC Blood Culture adequate volume   Culture      NO GROWTH < 24 HOURS Performed at [facility], [address], [place], [place] [address]    Report Status PENDING   Glucose, capillary     Status: Abnormal   Collection Time: 03/01/21  7:47 PM  Result Value Ref Range   Glucose-Capillary 221 (H) 70 - 99 mg/dL    Comment: Glucose reference range applies only to samples taken after fasting for at least 8 hours.  Urinalysis, Routine w reflex microscopic Urine, Clean Catch     Status: Abnormal   Collection Time: 03/01/21 11:00 PM  Result Value Ref Range   Color, Urine YELLOW YELLOW   APPearance HAZY (A) CLEAR   Specific Gravity, Urine 1.015 1.005 - 1.030   pH 5.0 5.0 - 8.0   Glucose, UA >=500 (A) NEGATIVE mg/dL   Hgb urine dipstick SMALL (A) NEGATIVE   Bilirubin Urine NEGATIVE NEGATIVE   Ketones, ur NEGATIVE NEGATIVE mg/dL   Protein, ur >=300 (A) NEGATIVE mg/dL   Nitrite NEGATIVE NEGATIVE   Leukocytes,Ua NEGATIVE NEGATIVE   RBC / HPF 0-5 0 - 5 RBC/hpf   WBC, UA 0-5 0 - 5 WBC/hpf   Bacteria, UA RARE (A) NONE SEEN   Squamous Epithelial / LPF 0-5 0 - 5   Mucus PRESENT    Hyaline Casts, UA PRESENT    Cellular Cast, UA 25     Comment: Performed at [facility], [address], [place], [place] [address]  Rapid urine drug screen (hospital performed)     Status: None   Collection Time: 03/01/21 11:00 PM  Result Value Ref Range   Opiates NONE DETECTED NONE DETECTED   Cocaine NONE DETECTED NONE DETECTED   Benzodiazepines NONE DETECTED NONE DETECTED   Amphetamines NONE DETECTED NONE DETECTED   Tetrahydrocannabinol NONE DETECTED NONE DETECTED   Barbiturates NONE DETECTED NONE DETECTED    Comment:  (NOTE) DRUG SCREEN FOR MEDICAL PURPOSES ONLY.  IF CONFIRMATION IS NEEDED FOR ANY PURPOSE, NOTIFY LAB WITHIN 5 DAYS.  LOWEST DETECTABLE LIMITS FOR URINE DRUG SCREEN Drug Class  Cutoff (ng/mL) Amphetamine and metabolites    1000 Barbiturate and metabolites    200 Benzodiazepine                 332 Tricyclics and metabolites     300 Opiates and metabolites        300 Cocaine and metabolites        300 THC                            50 Performed at [facility], [address] , [place] [address]   Glucose, capillary     Status: Abnormal   Collection Time: 03/02/21 12:37 AM  Result Value Ref Range   Glucose-Capillary 218 (H) 70 - 99 mg/dL    Comment: Glucose reference range applies only to samples taken after fasting for at least 8 hours.  Glucose, capillary     Status: Abnormal   Collection Time: 03/02/21  4:30 AM  Result Value Ref Range   Glucose-Capillary 190 (H) 70 - 99 mg/dL    Comment: Glucose reference range applies only to samples taken after fasting for at least 8 hours.  Comprehensive metabolic panel     Status: Abnormal   Collection Time: 03/02/21  4:43 AM  Result Value Ref Range   Sodium 135 135 - 145 mmol/L   Potassium 3.2 (L) 3.5 - 5.1 mmol/L   Chloride 104 98 - 111 mmol/L   CO2 22 22 - 32 mmol/L   Glucose, Bld 207 (H) 70 - 99 mg/dL    Comment: Glucose reference range applies only to samples taken after fasting for at least 8 hours.   BUN 28 (H) 8 - 23 mg/dL   Creatinine, Ser 1.36 (H) 0.44 - 1.00 mg/dL   Calcium 8.4 (L) 8.9 - 10.3 mg/dL   Total Protein 6.4 (L) 6.5 - 8.1 g/dL   Albumin 2.3 (L) 3.5 - 5.0 g/dL   AST 138 (H) 15 - 41 U/L   ALT 86 (H) 0 - 44 U/L   Alkaline Phosphatase 139 (H) 38 - 126 U/L   Total Bilirubin 0.8 0.3 - 1.2 mg/dL   GFR, Estimated 44 (L) >60 mL/min    Comment: (NOTE) Calculated using the CKD-EPI Creatinine Equation (2021)    Anion gap 9 5 - 15    Comment: Performed at [facility], [address]  [place], [place] [address]  CBC     Status: Abnormal   Collection Time: 03/02/21  4:43 AM  Result Value Ref Range   WBC 11.3 (H) 4.0 - 10.5 K/uL   RBC 3.86 (L) 3.87 - 5.11 MIL/uL   Hemoglobin 9.7 (L) 12.0 - 15.0 g/dL   HCT 31.1 (L) 36.0 - 46.0 %   MCV 80.6 80.0 - 100.0 fL   MCH 25.1 (L) 26.0 - 34.0 pg   MCHC 31.2 30.0 - 36.0 g/dL   RDW 14.1 11.5 - 15.5 %   Platelets 281 150 - 400 K/uL   nRBC 0.0 0.0 - 0.2 %    Comment: Performed at [facility], [address] [place], [place] [address]  Ferritin     Status: Abnormal   Collection Time: 03/02/21  4:43 AM  Result Value Ref Range   Ferritin 969 (H) 11 - 307 ng/mL    Comment: Performed at [facility], [address], [place],  [address]  Iron and TIBC     Status: Abnormal   Collection Time: 03/02/21  4:43 AM  Result Value Ref Range   Iron 14 (L) 28 - 170 ug/dL   TIBC 164 (L) 250 - 450 ug/dL   Saturation Ratios 9 (L) 10.4 - 31.8 %   UIBC 150 ug/dL    Comment: Performed at [facility], [address], [place], [place] [address]  Vitamin B12     Status: Abnormal   Collection Time: 03/02/21  4:43 AM  Result Value Ref Range   Vitamin B-12 1,334 (H) 180 - 914 pg/mL    Comment: (NOTE) This assay is not validated for testing neonatal or myeloproliferative syndrome specimens for Vitamin B12 levels. Performed at [facility], [address], [place], [place] [address]   Folate     Status: None   Collection Time: 03/02/21  4:43 AM  Result Value Ref Range   Folate 9.8 >5.9 ng/mL    Comment: Performed at [facility], [address] [place],  [address]  Glucose, capillary     Status: Abnormal   Collection Time: 03/02/21  7:23 AM  Result Value Ref Range   Glucose-Capillary 191 (H) 70 - 99 mg/dL    Comment: Glucose reference range applies only to samples taken after fasting for at least 8 hours.  Glucose, capillary     Status: Abnormal   Collection Time: 03/02/21 12:11 PM  Result Value Ref Range   Glucose-Capillary 202 (H) 70 -  99 mg/dL    Comment: Glucose reference range applies only to samples taken after fasting for at least 8 hours.    CT ABDOMEN PELVIS WO CONTRAST  Result Date: 02/28/2021 CLINICAL DATA:  Nonlocalized acute abdominal pain. EXAM: CT ABDOMEN AND PELVIS WITHOUT CONTRAST TECHNIQUE: Multidetector CT imaging of the abdomen and pelvis was performed following the standard protocol without IV contrast. COMPARISON:  Ultrasound pelvis 02/04/2018. FINDINGS: Lower chest: No acute abnormality. Hepatobiliary: No focal liver abnormality. Tiny calcified stones noted within the gallbladder lumen. No gallbladder wall thickening or pericholecystic fluid. No biliary dilatation. Pancreas: No focal lesion. Slight hazy pancreatic contour likely due to motion artifact. Otherwise normal pancreatic contour. No surrounding inflammatory changes. No main pancreatic ductal dilatation. Spleen: Normal in size without focal abnormality. A splenule is noted. Adrenals/Urinary Tract: No adrenal nodule bilaterally. Bilateral kidneys enhance symmetrically. No hydronephrosis. No hydroureter. The urinary bladder is unremarkable. Stomach/Bowel: Stomach is within normal limits. No evidence of bowel wall thickening or dilatation. Appendix appears normal. Vascular/Lymphatic: No abdominal aorta or iliac aneurysm. At least moderate atherosclerotic plaque of the aorta and its branches. No abdominal, pelvic, or inguinal lymphadenopathy. Reproductive: Redemonstration of a coarsely calcified left subserosal fibroid measuring up to at least 5.5 cm. A central coarsely calcified or in lesion is also noted likely representing known submucosal uterine fibroid. Bilateral adnexa are otherwise unremarkable. Other: No intraperitoneal free fluid. No intraperitoneal free gas. No organized fluid collection. Musculoskeletal: No abdominal wall hernia or abnormality. No suspicious lytic or blastic osseous lesions. No acute displaced fracture. IMPRESSION: 1. Slight hazy  pancreatic contour likely due to motion artifact. Differential diagnosis includes acute pancreatitis. Recommend correlation with lipase levels. 2. Cholelithiasis no CT findings of acute cholecystitis. 3. Subserosal and submucosal degenerative uterine fibroids. 4. Please note limited evaluation on this noncontrast study. Electronically Signed   By: Iven Finn M.D.   On: 02/28/2021 17:06   MR ABDOMEN MRCP WO CONTRAST  Result Date: 03/01/2021 CLINICAL DATA:  Abdominal pain.  Cholelithiasis. EXAM: MRI ABDOMEN WITHOUT CONTRAST  (INCLUDING MRCP) TECHNIQUE: Multiplanar multisequence MR imaging of the abdomen was  performed. Heavily T2-weighted images of the biliary and pancreatic ducts were obtained, and three-dimensional MRCP images were rendered by post processing. COMPARISON:  CT abdomen 02/28/2021 FINDINGS: Despite efforts by the technologist and patient, motion artifact is present on today's exam and could not be eliminated. This reduces exam sensitivity and specificity. Lower chest: Unremarkable Hepatobiliary: The small dependent gallstone in the gallbladder was better seen at CT. No biliary dilatation. No discrete filling defect in the common bile duct which measures 0.5 cm in diameter and which demonstrates normal conical tapering. No significant focal liver lesion identified. Pancreas:  Unremarkable Spleen:  Unremarkable Adrenals/Urinary Tract:  Both adrenal glands appear normal. 0.9 cm fluid density lesion of the left mid kidney anteriorly on image 21 series 4, probably a cyst. Similar 0.7 cm lesion of the right kidney upper pole on image 25 series 5. Stomach/Bowel: Unremarkable Vascular/Lymphatic:  Aortoiliac atherosclerotic vascular disease. Other: Uterine fibroids are observed on the coronal T2 weighted images. Musculoskeletal: Unremarkable IMPRESSION: 1. No biliary dilatation or appreciable choledocholithiasis on today's examination. The patient has known cholelithiasis based on recent CT although  previously seen gallstone is less conspicuous on today's MRI. 2. Small fluid density renal lesions favoring cysts. 3.  Aortic Atherosclerosis (ICD10-I70.0). 4. Uterine fibroids. Electronically Signed   By: Van Clines M.D.   On: 03/01/2021 10:47   MR 3D Recon At Scanner  Result Date: 03/02/2021 : Please see accession [id number] [facility] for MRCP report. Electronically Signed   By: Van Clines M.D.   On: 03/02/2021 08:06   US ARTERIAL SEG MULTIPLE LE (ABI, SEGMENTAL PRESSURES, PVR'S)  Result Date: 03/02/2021 CLINICAL DATA:  Right toe gangrene EXAM: NONINVASIVE PHYSIOLOGIC VASCULAR STUDY OF BILATERAL LOWER EXTREMITIES TECHNIQUE: Non-invasive vascular evaluation of both lower extremities was performed at rest, including calculation of ankle-brachial indices, multiple segmental pressure evaluation, segmental Doppler and segmental pulse volume recording. COMPARISON:  None. FINDINGS: Right Lower Extremity Resting ABI:  0.73 Segmental Pressures: Significant pressure gradient between the upper and lower thigh cuff consistent with SFA disease. Additionally, pressure gradients are present between the calf and ankle cuff consistent with below the knee disease. Arterial Waveforms: Transition to abnormal monophasic arterial waveform in the popliteal artery consistent with SFA occlusive disease. PVRs: Markedly blunted PVRs in the digits likely reflects severe small vessel disease. Left Lower Extremity: Resting ABI: 0.91 Segmental Pressures: Pressure gradient between the lower thigh and calf cuffs consistent with distal femoropopliteal disease. Arterial Waveforms: Irregular arterial waveforms likely affected by artifact. PVRs: Digital waveforms are relatively preserved. Other: Symmetric upper extremity pressures. Ankle Brachial index > 1.4 Non diagnostic secondary to incompressible vessel calcifications 1.0-1.4       Normal 0.9-0.99     Borderline PAD 0.8-0.89     Mild PAD 0.5-0.79     Moderate PAD < 0.5           Severe PAD Toe Brachial Index Normal     >0.65 Moderate  0.53-0.64 Severe     <0.23 Toe Pressures Absolute toe pressure >14mmHg sufficient for wound healing. Toe pressures <69mmHg = critical limb ischemia. IMPRESSION: 1. Abnormal resting right ankle-brachial index of 0.73 consistent with at least moderate peripheral arterial disease. 2. Segmental evaluation suggests outflow (superficial femoral) occlusive disease as well as runoff (below the knee) disease. Additionally, there is significant diminution of the arterial waveforms in the digits consistent with small vessel disease. 3. Resting left ankle-brachial index of 0.91 consistent with borderline peripheral arterial disease. 4. Segmental evaluation suggests an element of stenosis in the distal  femoropopliteal segment on the left. Signed, Criselda Peaches, MD, [facility] Electronically Signed   By: Jacqulynn Cadet M.D.   On: 03/02/2021 12:08   DG Foot Complete Right  Result Date: 03/01/2021 CLINICAL DATA:  Swelling with yellow fluid EXAM: RIGHT FOOT COMPLETE - 3+ VIEW COMPARISON:  None. FINDINGS: No fracture or malalignment. Moderate gas within the dorsal soft tissues of the foot extending to the anterior ankle region concerning for gas forming/necrotizing infection. Possible ulceration of the fifth digit at the level of the IP joint. Possible small erosion at the head of the fifth proximal phalanx. IMPRESSION: 1. Soft tissue swelling. Gas within the soft tissues of the dorsum of the foot and anterior to the ankle raises concern for gas forming/necrotizing infection 2. Possible ulceration of the fifth digit. Suspect small cortical erosion at the head of the fifth proximal phalanx raising concern for osteomyelitis These results will be called to the ordering clinician or representative by the Radiologist Assistant, and communication documented in the PACS or [employer]. Electronically  Signed   By: Donavan Foil M.D.   On: 03/01/2021 17:00    ROS:  Pertinent items are noted in HPI.  Blood pressure (!) 156/64, pulse (!) 105, temperature 98.7 F (37.1 C), temperature source Oral, resp. rate 16, height 5\' 8"  (1.727 m), weight 86.9 kg, SpO2 97 %. Physical Exam: Pleasant black female no acute distress Head is normocephalic, atraumatic Lungs clear to auscultation with good breath sounds bilaterally Heart examination reveals regular rate and rhythm without S3, S4, murmurs Extremity examination reveals a gangrenous black right fifth toe with purulent drainage emanating from just proximal to the metatarsal head to the dorsal aspect of the distal phalangeal joint.  The other toes look okay.  Mild erythema and swelling in the distal aspect of the right foot.  There is no cellulitis above the ankle.  I could not palpate a dorsalis pedis or posterior tibial pulse.  Segmental Doppler study reviewed  Assessment/Plan: Impression: Gangrene of right fifth toe, peripheral vascular disease, uncontrolled diabetes mellitus, transient elevated transaminitis. Plan: Patient will need amputation of the right fifth toe as it is not salvageable at this point.  Once she has recovered from this, she will need vascular surgery consultation for further work-up.  This can be done as an outpatient.  Risks and benefits of the procedure were fully explained to the patient, gave informed consent.  She realizes the wound may need to be left open.  Is scheduled for 03/03/2021.  Aviva Signs 03/02/2021, 2:22 PM

## 2021-03-02 NOTE — Consult Note (Signed)
Reason for Consult: Cellulitis of right fifth toe Referring Physician: Dr. Gypsy Lore ALIZZON DIOGUARDI is an 60 y.o. female.  HPI: Patient is a 60 year old white female who was admitted to the hospital on 02/28/2021 with worsening nausea and vomiting.  She was noted to have pancreatitis with evidence of possible cholelithiasis.  She was admitted in the hospital for work-up.  She was also noted to have uncontrolled diabetes mellitus.  An MRCP was performed which revealed no choledocholithiasis and a possible gallstone.  She has also been found on physical examination to have cellulitis of the right fifth toe with extension into the foot.  Plain x-rays done yesterday revealed some gas tracking into the forefoot.  Patient states that her foot has been sore over the past few days.  She has been no exactly when it started feeling bad.  She denies any trauma to the foot.  Past Medical History:  Diagnosis Date   Diabetes mellitus without complication (HCC)    Diabetic neuropathy ([place])    Diabetic neuropathy ([place])    Hypertension    Hypertensive retinopathy    OU    Past Surgical History:  Procedure Laterality Date   CATARACT EXTRACTION Bilateral    EYE SURGERY Bilateral    Cat Sx   FOOT SURGERY     right foot   TUBAL LIGATION      Family History  Problem Relation Age of Onset   Breast cancer Paternal Aunt    Diabetes Father    Colon cancer Neg Hx    Colon polyps Neg Hx     Social History:  reports that she has quit smoking. She has never used smokeless tobacco. She reports current alcohol use. She reports that she does not use drugs.  Allergies: No Known Allergies  Medications: Scheduled:  amLODipine  10 mg Oral Daily   aspirin EC  81 mg Oral Q breakfast   insulin aspart  0-9 Units Subcutaneous Q4H   insulin glargine  60 Units Subcutaneous QHS   rosuvastatin  40 mg Oral q1800    Results for orders placed or performed during the hospital encounter of 02/28/21 (from the past 48 hour(s))   CBG monitoring, ED     Status: Abnormal   Collection Time: 02/28/21  6:58 PM  Result Value Ref Range   Glucose-Capillary 386 (H) 70 - 99 mg/dL    Comment: Glucose reference range applies only to samples taken after fasting for at least 8 hours.  HIV Antibody (routine testing w rflx)     Status: None   Collection Time: 02/28/21  7:17 PM  Result Value Ref Range   HIV Screen 4th Generation wRfx Non Reactive Non Reactive    Comment: Performed at [facility], [place] [address], [place], [place] [address]  Basic metabolic panel     Status: Abnormal   Collection Time: 02/28/21  7:17 PM  Result Value Ref Range   Sodium 134 (L) 135 - 145 mmol/L   Potassium 3.7 3.5 - 5.1 mmol/L   Chloride 104 98 - 111 mmol/L   CO2 20 (L) 22 - 32 mmol/L   Glucose, Bld 400 (H) 70 - 99 mg/dL    Comment: Glucose reference range applies only to samples taken after fasting for at least 8 hours.   BUN 38 (H) 8 - 23 mg/dL   Creatinine, Ser 1.66 (H) 0.44 - 1.00 mg/dL   Calcium 8.5 (L) 8.9 - 10.3 mg/dL   GFR, Estimated 35 (L) >60  mL/min    Comment: (NOTE) Calculated using the CKD-EPI Creatinine Equation (2021)    Anion gap 10 5 - 15    Comment: Performed at [facility], [address], [place], [place] [address]  CBG monitoring, ED     Status: Abnormal   Collection Time: 02/28/21  8:01 PM  Result Value Ref Range   Glucose-Capillary 329 (H) 70 - 99 mg/dL    Comment: Glucose reference range applies only to samples taken after fasting for at least 8 hours.   Comment 1 Notify RN   SARS CORONAVIRUS 2 (TAT 6-24 HRS) Nasopharyngeal Nasopharyngeal Swab     Status: None   Collection Time: 02/28/21  8:58 PM   Specimen: Nasopharyngeal Swab  Result Value Ref Range   SARS Coronavirus 2 NEGATIVE NEGATIVE    Comment: (NOTE) SARS-CoV-2 target nucleic acids are NOT DETECTED.  The SARS-CoV-2 RNA is generally detectable in upper and lower respiratory specimens during the acute phase of infection. Negative results do not  preclude SARS-CoV-2 infection, do not rule out co-infections with other pathogens, and should not be used as the sole basis for treatment or other patient management decisions. Negative results must be combined with clinical observations, patient history, and epidemiological information. The expected result is Negative.  Fact Sheet for Patients: SugarRoll.be  Fact Sheet for Healthcare Providers: https://www.woods-mathews.com/  This test is not yet approved or cleared by the [place] FDA and  has been authorized for detection and/or diagnosis of SARS-CoV-2 by FDA under an Emergency Use Authorization (EUA). This EUA will remain  in effect (meaning this test can be used) for the duration of the COVID-19 declaration under Se ction 564(b)(1) of the Act, 21 U.S.C. section 360bbb-3(b)(1), unless the authorization is terminated or revoked sooner.  Performed at [facility], [place] [address] [place], [place] [address]   Glucose, capillary     Status: Abnormal   Collection Time: 03/01/21 12:05 AM  Result Value Ref Range   Glucose-Capillary 296 (H) 70 - 99 mg/dL    Comment: Glucose reference range applies only to samples taken after fasting for at least 8 hours.  Glucose, capillary     Status: Abnormal   Collection Time: 03/01/21  3:51 AM  Result Value Ref Range   Glucose-Capillary 248 (H) 70 - 99 mg/dL    Comment: Glucose reference range applies only to samples taken after fasting for at least 8 hours.  Hepatic function panel     Status: Abnormal   Collection Time: 03/01/21  6:24 AM  Result Value Ref Range   Total Protein 5.9 (L) 6.5 - 8.1 g/dL   Albumin 2.3 (L) 3.5 - 5.0 g/dL   AST 59 (H) 15 - 41 U/L   ALT 42 0 - 44 U/L   Alkaline Phosphatase 81 38 - 126 U/L   Total Bilirubin 0.7 0.3 - 1.2 mg/dL   Bilirubin, Direct 0.2 0.0 - 0.2 mg/dL   Indirect Bilirubin 0.5 0.3 - 0.9 mg/dL    Comment: Performed at [facility], [address] [place], [place] [address]  CBC WITH DIFFERENTIAL     Status: Abnormal   Collection Time: 03/01/21  6:24 AM  Result Value Ref Range   WBC 10.1 4.0 - 10.5 K/uL   RBC 3.64 (L) 3.87 - 5.11 MIL/uL   Hemoglobin 9.3 (L) 12.0 - 15.0 g/dL   HCT 29.3 (L) 36.0 - 46.0 %   MCV 80.5 80.0 - 100.0 fL   MCH 25.5 (L) 26.0 -  34.0 pg   MCHC 31.7 30.0 - 36.0 g/dL   RDW 14.0 11.5 - 15.5 %   Platelets 226 150 - 400 K/uL   nRBC 0.0 0.0 - 0.2 %   Neutrophils Relative % 78 %   Neutro Abs 7.9 (H) 1.7 - 7.7 K/uL   Lymphocytes Relative 12 %   Lymphs Abs 1.2 0.7 - 4.0 K/uL   Monocytes Relative 7 %   Monocytes Absolute 0.7 0.1 - 1.0 K/uL   Eosinophils Relative 1 %   Eosinophils Absolute 0.1 0.0 - 0.5 K/uL   Basophils Relative 0 %   Basophils Absolute 0.0 0.0 - 0.1 K/uL   Immature Granulocytes 2 %   Abs Immature Granulocytes 0.18 (H) 0.00 - 0.07 K/uL    Comment: Performed at [facility], [address] [place], [place] [address]  Basic metabolic panel     Status: Abnormal   Collection Time: 03/01/21  6:24 AM  Result Value Ref Range   Sodium 136 135 - 145 mmol/L   Potassium 3.5 3.5 - 5.1 mmol/L   Chloride 105 98 - 111 mmol/L   CO2 21 (L) 22 - 32 mmol/L   Glucose, Bld 253 (H) 70 - 99 mg/dL    Comment: Glucose reference range applies only to samples taken after fasting for at least 8 hours.   BUN 38 (H) 8 - 23 mg/dL   Creatinine, Ser 1.77 (H) 0.44 - 1.00 mg/dL   Calcium 8.3 (L) 8.9 - 10.3 mg/dL   GFR, Estimated 32 (L) >60 mL/min    Comment: (NOTE) Calculated using the CKD-EPI Creatinine Equation (2021)    Anion gap 10 5 - 15    Comment: Performed at [facility], [address], [place], [place] [address]  Glucose, capillary     Status: Abnormal   Collection Time: 03/01/21  7:15 AM  Result Value Ref Range   Glucose-Capillary 235 (H) 70 - 99 mg/dL    Comment: Glucose reference range applies only to samples taken after fasting for at least 8 hours.  Glucose, capillary     Status: Abnormal   Collection  Time: 03/01/21 11:09 AM  Result Value Ref Range   Glucose-Capillary 256 (H) 70 - 99 mg/dL    Comment: Glucose reference range applies only to samples taken after fasting for at least 8 hours.  Glucose, capillary     Status: Abnormal   Collection Time: 03/01/21  4:06 PM  Result Value Ref Range   Glucose-Capillary 211 (H) 70 - 99 mg/dL    Comment: Glucose reference range applies only to samples taken after fasting for at least 8 hours.  Aerobic/Anaerobic Culture w Gram Stain (surgical/deep wound)     Status: None (Preliminary result)   Collection Time: 03/01/21  4:20 PM   Specimen: Foot; Abscess  Result Value Ref Range   Specimen Description      FOOT Performed at [facility], [address], [place], [place] [address]    Special Requests      NONE Performed at [facility], [address], [place], [place] [address]    Gram Stain      RARE WBC PRESENT, PREDOMINANTLY PMN ABUNDANT GRAM POSITIVE COCCI    Culture      TOO YOUNG TO READ Performed at [facility], [place] [address], [place], [place] [address]    Report Status PENDING   Sedimentation rate     Status: Abnormal   Collection Time: 03/01/21  4:36 PM  Result Value Ref  Range   Sed Rate 130 (H) 0 - 22 mm/hr    Comment: Performed at [facility], [address] [place], [place] [address]  C-reactive protein     Status: Abnormal   Collection Time: 03/01/21  4:36 PM  Result Value Ref Range   CRP 43.8 (H) <1.0 mg/dL    Comment: Performed at [facility], [address], [place], [place] [address]  Culture, blood (Routine X 2) w Reflex to ID Panel     Status: None (Preliminary result)   Collection Time: 03/01/21  5:57 PM   Specimen: BLOOD RIGHT ARM  Result Value Ref Range   Specimen Description BLOOD RIGHT ARM    Special Requests      BOTTLES DRAWN AEROBIC AND ANAEROBIC Blood Culture adequate volume   Culture      NO GROWTH < 24 HOURS Performed at [facility], [address], [place], [place] [address]    Report  Status PENDING   Culture, blood (Routine X 2) w Reflex to ID Panel     Status: None (Preliminary result)   Collection Time: 03/01/21  5:57 PM   Specimen: BLOOD LEFT HAND  Result Value Ref Range   Specimen Description BLOOD LEFT HAND    Special Requests      BOTTLES DRAWN AEROBIC AND ANAEROBIC Blood Culture adequate volume   Culture      NO GROWTH < 24 HOURS Performed at [facility], [address], [place], [place] [address]    Report Status PENDING   Glucose, capillary     Status: Abnormal   Collection Time: 03/01/21  7:47 PM  Result Value Ref Range   Glucose-Capillary 221 (H) 70 - 99 mg/dL    Comment: Glucose reference range applies only to samples taken after fasting for at least 8 hours.  Urinalysis, Routine w reflex microscopic Urine, Clean Catch     Status: Abnormal   Collection Time: 03/01/21 11:00 PM  Result Value Ref Range   Color, Urine YELLOW YELLOW   APPearance HAZY (A) CLEAR   Specific Gravity, Urine 1.015 1.005 - 1.030   pH 5.0 5.0 - 8.0   Glucose, UA >=500 (A) NEGATIVE mg/dL   Hgb urine dipstick SMALL (A) NEGATIVE   Bilirubin Urine NEGATIVE NEGATIVE   Ketones, ur NEGATIVE NEGATIVE mg/dL   Protein, ur >=300 (A) NEGATIVE mg/dL   Nitrite NEGATIVE NEGATIVE   Leukocytes,Ua NEGATIVE NEGATIVE   RBC / HPF 0-5 0 - 5 RBC/hpf   WBC, UA 0-5 0 - 5 WBC/hpf   Bacteria, UA RARE (A) NONE SEEN   Squamous Epithelial / LPF 0-5 0 - 5   Mucus PRESENT    Hyaline Casts, UA PRESENT    Cellular Cast, UA 25     Comment: Performed at [facility], [address], [place], [place] [address]  Rapid urine drug screen (hospital performed)     Status: None   Collection Time: 03/01/21 11:00 PM  Result Value Ref Range   Opiates NONE DETECTED NONE DETECTED   Cocaine NONE DETECTED NONE DETECTED   Benzodiazepines NONE DETECTED NONE DETECTED   Amphetamines NONE DETECTED NONE DETECTED   Tetrahydrocannabinol NONE DETECTED NONE DETECTED   Barbiturates NONE DETECTED NONE DETECTED    Comment:  (NOTE) DRUG SCREEN FOR MEDICAL PURPOSES ONLY.  IF CONFIRMATION IS NEEDED FOR ANY PURPOSE, NOTIFY LAB WITHIN 5 DAYS.  LOWEST DETECTABLE LIMITS FOR URINE DRUG SCREEN Drug Class  Cutoff (ng/mL) Amphetamine and metabolites    1000 Barbiturate and metabolites    200 Benzodiazepine                 790 Tricyclics and metabolites     300 Opiates and metabolites        300 Cocaine and metabolites        300 THC                            50 Performed at [facility], [address] [place], [place] [address]   Glucose, capillary     Status: Abnormal   Collection Time: 03/02/21 12:37 AM  Result Value Ref Range   Glucose-Capillary 218 (H) 70 - 99 mg/dL    Comment: Glucose reference range applies only to samples taken after fasting for at least 8 hours.  Glucose, capillary     Status: Abnormal   Collection Time: 03/02/21  4:30 AM  Result Value Ref Range   Glucose-Capillary 190 (H) 70 - 99 mg/dL    Comment: Glucose reference range applies only to samples taken after fasting for at least 8 hours.  Comprehensive metabolic panel     Status: Abnormal   Collection Time: 03/02/21  4:43 AM  Result Value Ref Range   Sodium 135 135 - 145 mmol/L   Potassium 3.2 (L) 3.5 - 5.1 mmol/L   Chloride 104 98 - 111 mmol/L   CO2 22 22 - 32 mmol/L   Glucose, Bld 207 (H) 70 - 99 mg/dL    Comment: Glucose reference range applies only to samples taken after fasting for at least 8 hours.   BUN 28 (H) 8 - 23 mg/dL   Creatinine, Ser 1.36 (H) 0.44 - 1.00 mg/dL   Calcium 8.4 (L) 8.9 - 10.3 mg/dL   Total Protein 6.4 (L) 6.5 - 8.1 g/dL   Albumin 2.3 (L) 3.5 - 5.0 g/dL   AST 138 (H) 15 - 41 U/L   ALT 86 (H) 0 - 44 U/L   Alkaline Phosphatase 139 (H) 38 - 126 U/L   Total Bilirubin 0.8 0.3 - 1.2 mg/dL   GFR, Estimated 44 (L) >60 mL/min    Comment: (NOTE) Calculated using the CKD-EPI Creatinine Equation (2021)    Anion gap 9 5 - 15    Comment: Performed at [facility], [address]  [place], [place] [address]  CBC     Status: Abnormal   Collection Time: 03/02/21  4:43 AM  Result Value Ref Range   WBC 11.3 (H) 4.0 - 10.5 K/uL   RBC 3.86 (L) 3.87 - 5.11 MIL/uL   Hemoglobin 9.7 (L) 12.0 - 15.0 g/dL   HCT 31.1 (L) 36.0 - 46.0 %   MCV 80.6 80.0 - 100.0 fL   MCH 25.1 (L) 26.0 - 34.0 pg   MCHC 31.2 30.0 - 36.0 g/dL   RDW 14.1 11.5 - 15.5 %   Platelets 281 150 - 400 K/uL   nRBC 0.0 0.0 - 0.2 %    Comment: Performed at [facility], [address] [place], [place] [address]  Ferritin     Status: Abnormal   Collection Time: 03/02/21  4:43 AM  Result Value Ref Range   Ferritin 969 (H) 11 - 307 ng/mL    Comment: Performed at [facility], [address] [place],  [address]  Iron and TIBC     Status: Abnormal   Collection Time: 03/02/21  4:43 AM  Result Value Ref Range   Iron 14 (L) 28 - 170 ug/dL   TIBC 164 (L) 250 - 450 ug/dL   Saturation Ratios 9 (L) 10.4 - 31.8 %   UIBC 150 ug/dL    Comment: Performed at [facility], [address], [place], [place] [address]  Vitamin B12     Status: Abnormal   Collection Time: 03/02/21  4:43 AM  Result Value Ref Range   Vitamin B-12 1,334 (H) 180 - 914 pg/mL    Comment: (NOTE) This assay is not validated for testing neonatal or myeloproliferative syndrome specimens for Vitamin B12 levels. Performed at [facility], [address] [place], [place] [address]   Folate     Status: None   Collection Time: 03/02/21  4:43 AM  Result Value Ref Range   Folate 9.8 >5.9 ng/mL    Comment: Performed at [facility], [address] [place], [place] [address]  Glucose, capillary     Status: Abnormal   Collection Time: 03/02/21  7:23 AM  Result Value Ref Range   Glucose-Capillary 191 (H) 70 - 99 mg/dL    Comment: Glucose reference range applies only to samples taken after fasting for at least 8 hours.  Glucose, capillary     Status: Abnormal   Collection Time: 03/02/21 12:11 PM  Result Value Ref Range   Glucose-Capillary 202 (H) 70 -  99 mg/dL    Comment: Glucose reference range applies only to samples taken after fasting for at least 8 hours.    CT ABDOMEN PELVIS WO CONTRAST  Result Date: 02/28/2021 CLINICAL DATA:  Nonlocalized acute abdominal pain. EXAM: CT ABDOMEN AND PELVIS WITHOUT CONTRAST TECHNIQUE: Multidetector CT imaging of the abdomen and pelvis was performed following the standard protocol without IV contrast. COMPARISON:  Ultrasound pelvis 02/04/2018. FINDINGS: Lower chest: No acute abnormality. Hepatobiliary: No focal liver abnormality. Tiny calcified stones noted within the gallbladder lumen. No gallbladder wall thickening or pericholecystic fluid. No biliary dilatation. Pancreas: No focal lesion. Slight hazy pancreatic contour likely due to motion artifact. Otherwise normal pancreatic contour. No surrounding inflammatory changes. No main pancreatic ductal dilatation. Spleen: Normal in size without focal abnormality. A splenule is noted. Adrenals/Urinary Tract: No adrenal nodule bilaterally. Bilateral kidneys enhance symmetrically. No hydronephrosis. No hydroureter. The urinary bladder is unremarkable. Stomach/Bowel: Stomach is within normal limits. No evidence of bowel wall thickening or dilatation. Appendix appears normal. Vascular/Lymphatic: No abdominal aorta or iliac aneurysm. At least moderate atherosclerotic plaque of the aorta and its branches. No abdominal, pelvic, or inguinal lymphadenopathy. Reproductive: Redemonstration of a coarsely calcified left subserosal fibroid measuring up to at least 5.5 cm. A central coarsely calcified or in lesion is also noted likely representing known submucosal uterine fibroid. Bilateral adnexa are otherwise unremarkable. Other: No intraperitoneal free fluid. No intraperitoneal free gas. No organized fluid collection. Musculoskeletal: No abdominal wall hernia or abnormality. No suspicious lytic or blastic osseous lesions. No acute displaced fracture. IMPRESSION: 1. Slight hazy  pancreatic contour likely due to motion artifact. Differential diagnosis includes acute pancreatitis. Recommend correlation with lipase levels. 2. Cholelithiasis no CT findings of acute cholecystitis. 3. Subserosal and submucosal degenerative uterine fibroids. 4. Please note limited evaluation on this noncontrast study. Electronically Signed   By: Iven Finn M.D.   On: 02/28/2021 17:06   MR ABDOMEN MRCP WO CONTRAST  Result Date: 03/01/2021 CLINICAL DATA:  Abdominal pain.  Cholelithiasis. EXAM: MRI ABDOMEN WITHOUT CONTRAST  (INCLUDING MRCP) TECHNIQUE: Multiplanar multisequence MR imaging of the abdomen was  performed. Heavily T2-weighted images of the biliary and pancreatic ducts were obtained, and three-dimensional MRCP images were rendered by post processing. COMPARISON:  CT abdomen 02/28/2021 FINDINGS: Despite efforts by the technologist and patient, motion artifact is present on today's exam and could not be eliminated. This reduces exam sensitivity and specificity. Lower chest: Unremarkable Hepatobiliary: The small dependent gallstone in the gallbladder was better seen at CT. No biliary dilatation. No discrete filling defect in the common bile duct which measures 0.5 cm in diameter and which demonstrates normal conical tapering. No significant focal liver lesion identified. Pancreas:  Unremarkable Spleen:  Unremarkable Adrenals/Urinary Tract:  Both adrenal glands appear normal. 0.9 cm fluid density lesion of the left mid kidney anteriorly on image 21 series 4, probably a cyst. Similar 0.7 cm lesion of the right kidney upper pole on image 25 series 5. Stomach/Bowel: Unremarkable Vascular/Lymphatic:  Aortoiliac atherosclerotic vascular disease. Other: Uterine fibroids are observed on the coronal T2 weighted images. Musculoskeletal: Unremarkable IMPRESSION: 1. No biliary dilatation or appreciable choledocholithiasis on today's examination. The patient has known cholelithiasis based on recent CT although  previously seen gallstone is less conspicuous on today's MRI. 2. Small fluid density renal lesions favoring cysts. 3.  Aortic Atherosclerosis (ICD10-I70.0). 4. Uterine fibroids. Electronically Signed   By: Van Clines M.D.   On: 03/01/2021 10:47   MR 3D Recon At Scanner  Result Date: 03/02/2021 : Please see accession [id number] [facility] for MRCP report. Electronically Signed   By: Van Clines M.D.   On: 03/02/2021 08:06   US ARTERIAL SEG MULTIPLE LE (ABI, SEGMENTAL PRESSURES, PVR'S)  Result Date: 03/02/2021 CLINICAL DATA:  Right toe gangrene EXAM: NONINVASIVE PHYSIOLOGIC VASCULAR STUDY OF BILATERAL LOWER EXTREMITIES TECHNIQUE: Non-invasive vascular evaluation of both lower extremities was performed at rest, including calculation of ankle-brachial indices, multiple segmental pressure evaluation, segmental Doppler and segmental pulse volume recording. COMPARISON:  None. FINDINGS: Right Lower Extremity Resting ABI:  0.73 Segmental Pressures: Significant pressure gradient between the upper and lower thigh cuff consistent with SFA disease. Additionally, pressure gradients are present between the calf and ankle cuff consistent with below the knee disease. Arterial Waveforms: Transition to abnormal monophasic arterial waveform in the popliteal artery consistent with SFA occlusive disease. PVRs: Markedly blunted PVRs in the digits likely reflects severe small vessel disease. Left Lower Extremity: Resting ABI: 0.91 Segmental Pressures: Pressure gradient between the lower thigh and calf cuffs consistent with distal femoropopliteal disease. Arterial Waveforms: Irregular arterial waveforms likely affected by artifact. PVRs: Digital waveforms are relatively preserved. Other: Symmetric upper extremity pressures. Ankle Brachial index > 1.4 Non diagnostic secondary to incompressible vessel calcifications 1.0-1.4       Normal 0.9-0.99     Borderline PAD 0.8-0.89     Mild PAD 0.5-0.79     Moderate PAD < 0.5           Severe PAD Toe Brachial Index Normal     >0.65 Moderate  0.53-0.64 Severe     <0.23 Toe Pressures Absolute toe pressure >39mmHg sufficient for wound healing. Toe pressures <29mmHg = critical limb ischemia. IMPRESSION: 1. Abnormal resting right ankle-brachial index of 0.73 consistent with at least moderate peripheral arterial disease. 2. Segmental evaluation suggests outflow (superficial femoral) occlusive disease as well as runoff (below the knee) disease. Additionally, there is significant diminution of the arterial waveforms in the digits consistent with small vessel disease. 3. Resting left ankle-brachial index of 0.91 consistent with borderline peripheral arterial disease. 4. Segmental evaluation suggests an element of stenosis in the distal  femoropopliteal segment on the left. Signed, Criselda Peaches, MD, [facility] Electronically Signed   By: Jacqulynn Cadet M.D.   On: 03/02/2021 12:08   DG Foot Complete Right  Result Date: 03/01/2021 CLINICAL DATA:  Swelling with yellow fluid EXAM: RIGHT FOOT COMPLETE - 3+ VIEW COMPARISON:  None. FINDINGS: No fracture or malalignment. Moderate gas within the dorsal soft tissues of the foot extending to the anterior ankle region concerning for gas forming/necrotizing infection. Possible ulceration of the fifth digit at the level of the IP joint. Possible small erosion at the head of the fifth proximal phalanx. IMPRESSION: 1. Soft tissue swelling. Gas within the soft tissues of the dorsum of the foot and anterior to the ankle raises concern for gas forming/necrotizing infection 2. Possible ulceration of the fifth digit. Suspect small cortical erosion at the head of the fifth proximal phalanx raising concern for osteomyelitis These results will be called to the ordering clinician or representative by the Radiologist Assistant, and communication documented in the PACS or [employer]. Electronically  Signed   By: Donavan Foil M.D.   On: 03/01/2021 17:00    ROS:  Pertinent items are noted in HPI.  Blood pressure (!) 156/64, pulse (!) 105, temperature 98.7 F (37.1 C), temperature source Oral, resp. rate 16, height 5\' 8"  (1.727 m), weight 86.9 kg, SpO2 97 %. Physical Exam: Pleasant black female no acute distress Head is normocephalic, atraumatic Lungs clear to auscultation with good breath sounds bilaterally Heart examination reveals regular rate and rhythm without S3, S4, murmurs Extremity examination reveals a gangrenous black right fifth toe with purulent drainage emanating from just proximal to the metatarsal head to the dorsal aspect of the distal phalangeal joint.  The other toes look okay.  Mild erythema and swelling in the distal aspect of the right foot.  There is no cellulitis above the ankle.  I could not palpate a dorsalis pedis or posterior tibial pulse.  Segmental Doppler study reviewed  Assessment/Plan: Impression: Gangrene of right fifth toe, peripheral vascular disease, uncontrolled diabetes mellitus, transient elevated transaminitis. Plan: Patient will need amputation of the right fifth toe as it is not salvageable at this point.  Once she has recovered from this, she will need vascular surgery consultation for further work-up.  This can be done as an outpatient.  Risks and benefits of the procedure were fully explained to the patient, gave informed consent.  She realizes the wound may need to be left open.  Is scheduled for 03/03/2021.  Aviva Signs 03/02/2021, 2:22 PM

## 2021-03-02 NOTE — Anesthesia Preprocedure Evaluation (Addendum)
Anesthesia Evaluation  Patient identified by MRN, date of birth, ID band Patient awake    Reviewed: Allergy & Precautions, NPO status , Patient's Chart, lab work & pertinent test results  History of Anesthesia Complications Negative for: history of anesthetic complications  Airway Mallampati: II  TM Distance: >3 FB Neck ROM: Full    Dental  (+) Missing, Dental Advisory Given   Pulmonary former smoker,    Pulmonary exam normal breath sounds clear to auscultation       Cardiovascular hypertension, Pt. on medications Normal cardiovascular exam Rhythm:Regular Rate:Normal     Neuro/Psych  Neuromuscular disease (neuropathy)    GI/Hepatic Neg liver ROS, GERD  Medicated,  Endo/Other  diabetes, Poorly Controlled, Type 2, Insulin Dependent, Oral Hypoglycemic Agents  Renal/GU Renal InsufficiencyRenal disease (AKI)     Musculoskeletal Right foot cellulitis    Abdominal   Peds  Hematology   Anesthesia Other Findings Sepsis, osteomyelitis  DKA C/O severe nausea/vomiting   Reproductive/Obstetrics negative OB ROS                            Anesthesia Physical Anesthesia Plan  ASA: 4  Anesthesia Plan: General   Post-op Pain Management:    Induction: Intravenous and Rapid sequence  PONV Risk Score and Plan: 4 or greater and Ondansetron and Metaclopromide  Airway Management Planned: Oral ETT  Additional Equipment:   Intra-op Plan:   Post-operative Plan: Extubation in OR  Informed Consent: I have reviewed the patients History and Physical, chart, labs and discussed the procedure including the risks, benefits and alternatives for the proposed anesthesia with the patient or authorized representative who has indicated his/her understanding and acceptance.     Dental advisory given  Plan Discussed with: CRNA and Surgeon  Anesthesia Plan Comments:        Anesthesia Quick Evaluation

## 2021-03-02 NOTE — Progress Notes (Signed)
Patient Demographics:    Nicole Golden, is a 60 y.o. female, DOB - 03-Aug-1961, TTS:[id number]  Admit date - 02/28/2021   Admitting Physician [name], MD  Outpatient Primary MD for the patient is [name], DO  LOS - 2   No chief complaint on file.       Subjective:    Nicole Golden complained of some nausea, no vomiting.  No abdominal pain.  Did have some fever overnight.  No pain in foot.  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 ---Rt   Gallstones   Gangrene of toe of right foot ([place])  Brief Summary:- 60 year old with past medical history relevant for uncontrolled DM with peripheral neuropathy, HTN, HLD, CKD 3B, and chronic right small toe wound admitted on 02/28/2021 with sepsis secondary to right foot cellulitis after presenting  to the ED with malaise, fatigue, fevers, nausea and vomiting for the last 4 days - A/p 1)Sepsis Secondary to Right Foot Cellulitis/OsteoMyelits --POA--patient met sepsis criteria on admission with WBC of 11.9, fever of 102.7, tachycardia with heart rate of 110 tachypnea with respiratory rate of 22- -Plain films of right foot shows possible osteomyelitis of fifth digit concern for necrotizing infection --IV vancomycin per cellulitis order set -We will add Flagyl in this  diabetic female with foul-smelling wound -Seen by general surgery with plans for amputation of right fifth digit on 7/7 -Patient with significant right foot peripheral neuropathy in the setting of uncontrolled DM--- despite probing the wound and obtaining wound cultures patient had No  foot pain  2)Cholelithiasis without cholecystitis--- LFTs noted -CT abdomen and pelvis and MRCP noted, no biliary dilatation or choledocholithiasis -Discussed with general surgeon, no need for inpatient intervention at this time -Patient may benefit from outpatient consultation for possible elective lap chole  Hepatic Function Latest Ref Rng & Units 03/02/2021 03/01/2021 02/28/2021  Total Protein 6.5 - 8.1 g/dL 6.4(L) 5.9(L) 7.2  Albumin 3.5 - 5.0 g/dL 2.3(L) 2.3(L) 3.0(L)  AST 15 - 41 U/L 138(H) 59(H) 39  ALT 0 - 44 U/L 86(H) 42 38  Alk Phosphatase 38 - 126 U/L 139(H) 81 88  Total Bilirubin 0.3 - 1.2 mg/dL 0.8 0.7 1.0  Bilirubin, Direct 0.0 - 0.2 mg/dL - 0.2 -   3)Acute pancreatitis with nausea and vomiting and abdominal pain---- -=patient with H/o  idiopathic pancreatitis, -Lipase is up to73, it was 20 on 02/27/2021  no EtOH use  - Has nausea and vomiting----secondary to #1 and #2 above -Improved with IV fluids and IV antiemetics -Okay to advance diet as tolerated  -Abdominal imaging studies as noted above in #2 without acute findings -We will need to follow-up with GI and general surgery as an outpatient  4)Aki on CKD stage - 3B -Creatinine noted to be elevated on admission, up to 1.8, creatinine was 1.45 on 02/07/2021 --- renally adjust medications, avoid nephrotoxic agents / dehydration  / hypotension -She has been hydrated with IV fluids and creatinine  has returned to baseline  5)HTN--BPs not at goal, increase amlodipine to 10 mg daily, -- hold Lasix, hold losartan and HCTZ due to sepsis and AKI -May use IV labetalol as needed elevated BP  6)DM2--A1c previously greater than 12 reflecting uncontrolled DM with hyperglycemia -Continue Lantus insulin Use Novolog/Humalog Sliding scale insulin with Accu-Cheks/Fingersticks as ordered  -Overall blood sugars been stable  7) chronic anemia most likely secondary to CKD--- hemoglobin is down to 9.7 from a baseline usually above 11 --No  evidence of ongoing bleeding -Anemia work-up/labs including stool occult blood ordered and pending -May benefit from ESA/EPO agent intermittently  Disposition/Need for in-Hospital Stay- patient unable to be discharged at this time due to sepsis secondary to right foot cellulitis and abscess in a diabetic patient requiring IV antibiotics and IVF  Status is: Inpatient  Remains inpatient appropriate because: See disposition above  Disposition: The patient is from: Home              Anticipated d/c is to: Home              Anticipated d/c date is: 3 days              Patient currently is not medically stable to d/c. Barriers: Not Clinically Stable-   Code Status : -  Code Status: Full Code   Family Communication:   (patient is alert, awake and coherent)  Consults  :  Gen surgery  DVT Prophylaxis  :   - SCDs  SCD's Start: 03/02/21 1635 Place and maintain sequential compression device Start: 02/28/21 1850    Lab Results  Component Value Date   PLT 281 03/02/2021    Inpatient Medications  Scheduled Meds:  amLODipine  10 mg Oral Daily   aspirin EC  81 mg Oral Q breakfast   Chlorhexidine Gluconate Cloth  6 each Topical Once   insulin aspart  0-9 Units Subcutaneous Q4H   insulin glargine  60 Units Subcutaneous QHS   rosuvastatin  40 mg Oral q1800   Continuous Infusions:  metronidazole 500 mg (03/02/21 1942)   vancomycin 1,250 mg (03/02/21 2044)   PRN Meds:.acetaminophen, hydrALAZINE, labetalol, ondansetron **OR** ondansetron (ZOFRAN) IV    Anti-infectives (From admission, onward)    Start     Dose/Rate Route Frequency Ordered Stop   03/02/21 1800  vancomycin (VANCOCIN) IVPB 1000 mg/200 mL premix  Status:  Discontinued        1,000 mg 200 mL/hr over 60 Minutes Intravenous Every 24 hours 03/01/21 1630 03/02/21 0852   03/02/21 1800  vancomycin (VANCOREADY) IVPB 1250 mg/250 mL        1,250 mg 166.7 mL/hr over 90 Minutes Intravenous Every 24 hours 03/02/21 0852      03/01/21 1800  metroNIDAZOLE (FLAGYL) IVPB 500 mg        500 mg 100 mL/hr over 60 Minutes Intravenous Every 8 hours 03/01/21 1647     03/01/21 1730  vancomycin (VANCOREADY) IVPB 1500 mg/300 mL        1,500 mg 150 mL/hr over 120 Minutes Intravenous  Once 03/01/21 1624 03/01/21 1856   03/01/21 1715  vancomycin (VANCOCIN) IVPB 1000 mg/200 mL premix  Status:  Discontinued        1,000 mg 200 mL/hr over 60 Minutes Intravenous  Once 03/01/21 1622 03/01/21 1623         Objective:   Vitals:   03/02/21 0500 03/02/21 0536 03/02/21 0759 03/02/21 1338  BP:  101/79 (!) 130/101 (!) 156/64  Pulse:  Marland Kitchen)  105 90 (!) 105  Resp:  17 18 16   Temp:  (!) 102 F (38.9 C) 98.2 F (36.8 C) 98.7 F (37.1 C)  TempSrc:  Oral Oral Oral  SpO2:  96% 99% 97%  Weight: 84.3 kg  86.9 kg   Height:        Wt Readings from Last 3 Encounters:  03/02/21 86.9 kg  02/12/21 86.2 kg  02/07/21 86.2 kg     Intake/Output Summary (Last 24 hours) at 03/02/2021 2048 Last data filed at 03/02/2021 1700 Gross per 24 hour  Intake 1009.72 ml  Output 701 ml  Net 308.72 ml     Physical Exam  General exam: Alert, awake, oriented x 3 Respiratory system: Clear to auscultation. Respiratory effort normal. Cardiovascular system:RRR. No murmurs, rubs, gallops. Gastrointestinal system: Abdomen is nondistended, soft and nontender. No organomegaly or masses felt. Normal bowel sounds heard. Central nervous system: Alert and oriented. No focal neurological deficits. Extremities: No C/C/E, +pedal pulses Skin: Gangrenous fifth toe on right foot with purulent drainage noted. Psychiatry: Judgement and insight appear normal. Mood & affect appropriate.     Data Review:   Micro Results Recent Results (from the past 240 hour(s))  SARS CORONAVIRUS 2 (TAT 6-24 HRS) Nasopharyngeal Nasopharyngeal Swab     Status: None   Collection Time: 02/28/21  8:58 PM   Specimen: Nasopharyngeal Swab  Result Value Ref Range Status   SARS Coronavirus 2  NEGATIVE NEGATIVE Final    Comment: (NOTE) SARS-CoV-2 target nucleic acids are NOT DETECTED.  The SARS-CoV-2 RNA is generally detectable in upper and lower respiratory specimens during the acute phase of infection. Negative results do not preclude SARS-CoV-2 infection, do not rule out co-infections with other pathogens, and should not be used as the sole basis for treatment or other patient management decisions. Negative results must be combined with clinical observations, patient history, and epidemiological information. The expected result is Negative.  Fact Sheet for Patients: SugarRoll.be  Fact Sheet for Healthcare Providers: https://www.woods-mathews.com/  This test is not yet approved or cleared by the [place] FDA and  has been authorized for detection and/or diagnosis of SARS-CoV-2 by FDA under an Emergency Use Authorization (EUA). This EUA will remain  in effect (meaning this test can be used) for the duration of the COVID-19 declaration under Se ction 564(b)(1) of the Act, 21 U.S.C. section 360bbb-3(b)(1), unless the authorization is terminated or revoked sooner.  Performed at [facility], [place] [address] [place], [place] [address]   Aerobic/Anaerobic Culture w Gram Stain (surgical/deep wound)     Status: None (Preliminary result)   Collection Time: 03/01/21  4:20 PM   Specimen: Foot; Abscess  Result Value Ref Range Status   Specimen Description   Final    FOOT Performed at [facility], [address], [place], [place] [address]    Special Requests   Final    NONE Performed at [facility], [address], [place], [place] [address]    Gram Stain   Final    RARE WBC PRESENT, PREDOMINANTLY PMN ABUNDANT GRAM POSITIVE COCCI    Culture   Final    TOO YOUNG TO READ Performed at [facility], [place] [address], [place], [place] [address]    Report Status PENDING  Incomplete  Culture, blood (Routine X 2) w  Reflex to ID Panel     Status: None (Preliminary result)   Collection Time: 03/01/21  5:57 PM   Specimen: BLOOD RIGHT ARM  Result  Value Ref Range Status   Specimen Description BLOOD RIGHT ARM  Final   Special Requests   Final    BOTTLES DRAWN AEROBIC AND ANAEROBIC Blood Culture adequate volume   Culture   Final    NO GROWTH < 24 HOURS Performed at [facility], [address], [place], [place] [address]    Report Status PENDING  Incomplete  Culture, blood (Routine X 2) w Reflex to ID Panel     Status: None (Preliminary result)   Collection Time: 03/01/21  5:57 PM   Specimen: BLOOD LEFT HAND  Result Value Ref Range Status   Specimen Description BLOOD LEFT HAND  Final   Special Requests   Final    BOTTLES DRAWN AEROBIC AND ANAEROBIC Blood Culture adequate volume   Culture   Final    NO GROWTH < 24 HOURS Performed at [facility], [address], [place], [place] [address]    Report Status PENDING  Incomplete    Radiology Reports CT ABDOMEN PELVIS WO CONTRAST  Result Date: 02/28/2021 CLINICAL DATA:  Nonlocalized acute abdominal pain. EXAM: CT ABDOMEN AND PELVIS WITHOUT CONTRAST TECHNIQUE: Multidetector CT imaging of the abdomen and pelvis was performed following the standard protocol without IV contrast. COMPARISON:  Ultrasound pelvis 02/04/2018. FINDINGS: Lower chest: No acute abnormality. Hepatobiliary: No focal liver abnormality. Tiny calcified stones noted within the gallbladder lumen. No gallbladder wall thickening or pericholecystic fluid. No biliary dilatation. Pancreas: No focal lesion. Slight hazy pancreatic contour likely due to motion artifact. Otherwise normal pancreatic contour. No surrounding inflammatory changes. No main pancreatic ductal dilatation. Spleen: Normal in size without focal abnormality. A splenule is noted. Adrenals/Urinary Tract: No adrenal nodule bilaterally. Bilateral kidneys enhance symmetrically. No hydronephrosis. No hydroureter. The urinary bladder is  unremarkable. Stomach/Bowel: Stomach is within normal limits. No evidence of bowel wall thickening or dilatation. Appendix appears normal. Vascular/Lymphatic: No abdominal aorta or iliac aneurysm. At least moderate atherosclerotic plaque of the aorta and its branches. No abdominal, pelvic, or inguinal lymphadenopathy. Reproductive: Redemonstration of a coarsely calcified left subserosal fibroid measuring up to at least 5.5 cm. A central coarsely calcified or in lesion is also noted likely representing known submucosal uterine fibroid. Bilateral adnexa are otherwise unremarkable. Other: No intraperitoneal free fluid. No intraperitoneal free gas. No organized fluid collection. Musculoskeletal: No abdominal wall hernia or abnormality. No suspicious lytic or blastic osseous lesions. No acute displaced fracture. IMPRESSION: 1. Slight hazy pancreatic contour likely due to motion artifact. Differential diagnosis includes acute pancreatitis. Recommend correlation with lipase levels. 2. Cholelithiasis no CT findings of acute cholecystitis. 3. Subserosal and submucosal degenerative uterine fibroids. 4. Please note limited evaluation on this noncontrast study. Electronically Signed   By: Iven Finn M.D.   On: 02/28/2021 17:06   MR ABDOMEN MRCP WO CONTRAST  Result Date: 03/01/2021 CLINICAL DATA:  Abdominal pain.  Cholelithiasis. EXAM: MRI ABDOMEN WITHOUT CONTRAST  (INCLUDING MRCP) TECHNIQUE: Multiplanar multisequence MR imaging of the abdomen was performed. Heavily T2-weighted images of the biliary and pancreatic ducts were obtained, and three-dimensional MRCP images were rendered by post processing. COMPARISON:  CT abdomen 02/28/2021 FINDINGS: Despite efforts by the technologist and patient, motion artifact is present on today's exam and could not be eliminated. This reduces exam sensitivity and specificity. Lower chest: Unremarkable Hepatobiliary: The small dependent gallstone in the gallbladder was better seen at CT.  No biliary dilatation. No discrete filling defect in the common bile duct which measures 0.5 cm in diameter and which demonstrates normal conical tapering. No significant  focal liver lesion identified. Pancreas:  Unremarkable Spleen:  Unremarkable Adrenals/Urinary Tract:  Both adrenal glands appear normal. 0.9 cm fluid density lesion of the left mid kidney anteriorly on image 21 series 4, probably a cyst. Similar 0.7 cm lesion of the right kidney upper pole on image 25 series 5. Stomach/Bowel: Unremarkable Vascular/Lymphatic:  Aortoiliac atherosclerotic vascular disease. Other: Uterine fibroids are observed on the coronal T2 weighted images. Musculoskeletal: Unremarkable IMPRESSION: 1. No biliary dilatation or appreciable choledocholithiasis on today's examination. The patient has known cholelithiasis based on recent CT although previously seen gallstone is less conspicuous on today's MRI. 2. Small fluid density renal lesions favoring cysts. 3.  Aortic Atherosclerosis (ICD10-I70.0). 4. Uterine fibroids. Electronically Signed   By: Van Clines M.D.   On: 03/01/2021 10:47   MR 3D Recon At Scanner  Result Date: 03/02/2021 : Please see accession [id number] [facility] for MRCP report. Electronically Signed   By: Van Clines M.D.   On: 03/02/2021 08:06   US ARTERIAL SEG MULTIPLE LE (ABI, SEGMENTAL PRESSURES, PVR'S)  Result Date: 03/02/2021 CLINICAL DATA:  Right toe gangrene EXAM: NONINVASIVE PHYSIOLOGIC VASCULAR STUDY OF BILATERAL LOWER EXTREMITIES TECHNIQUE: Non-invasive vascular evaluation of both lower extremities was performed at rest, including calculation of ankle-brachial indices, multiple segmental pressure evaluation, segmental Doppler and segmental pulse volume recording. COMPARISON:  None. FINDINGS: Right Lower Extremity Resting ABI:  0.73 Segmental Pressures: Significant pressure gradient between the upper and lower thigh cuff consistent with SFA disease. Additionally, pressure gradients are  present between the calf and ankle cuff consistent with below the knee disease. Arterial Waveforms: Transition to abnormal monophasic arterial waveform in the popliteal artery consistent with SFA occlusive disease. PVRs: Markedly blunted PVRs in the digits likely reflects severe small vessel disease. Left Lower Extremity: Resting ABI: 0.91 Segmental Pressures: Pressure gradient between the lower thigh and calf cuffs consistent with distal femoropopliteal disease. Arterial Waveforms: Irregular arterial waveforms likely affected by artifact. PVRs: Digital waveforms are relatively preserved. Other: Symmetric upper extremity pressures. Ankle Brachial index > 1.4 Non diagnostic secondary to incompressible vessel calcifications 1.0-1.4       Normal 0.9-0.99     Borderline PAD 0.8-0.89     Mild PAD 0.5-0.79     Moderate PAD < 0.5          Severe PAD Toe Brachial Index Normal     >0.65 Moderate  0.53-0.64 Severe     <0.23 Toe Pressures Absolute toe pressure >77mHg sufficient for wound healing. Toe pressures <574mg = critical limb ischemia. IMPRESSION: 1. Abnormal resting right ankle-brachial index of 0.73 consistent with at least moderate peripheral arterial disease. 2. Segmental evaluation suggests outflow (superficial femoral) occlusive disease as well as runoff (below the knee) disease. Additionally, there is significant diminution of the arterial waveforms in the digits consistent with small vessel disease. 3. Resting left ankle-brachial index of 0.91 consistent with borderline peripheral arterial disease. 4. Segmental evaluation suggests an element of stenosis in the distal femoropopliteal segment on the left. Signed, HeCriselda PeachesMD, RPCarmel-by-the-Seaascular and Inter[facility] Rad[facility] Specialists GrEn[facility] Memphis[facility]iology Electronically Signed   By: HeJacqulynn Cadet.D.   On: 03/02/2021 12:08   DG Abd Acute W/Chest  Result Date: 02/28/2021 CLINICAL DATA:  Vomiting. EXAM: DG ABDOMEN ACUTE WITH 1 VIEW CHEST COMPARISON:   None. FINDINGS: There is no evidence of dilated bowel loops or free intraperitoneal air. Calcified uterine fibroid is noted in the pelvis. Heart size and mediastinal contours are within normal limits. Both lungs are clear. IMPRESSION: No abnormal  bowel dilatation.  No acute cardiopulmonary disease. Electronically Signed   By: Marijo Conception M.D.   On: 02/28/2021 13:39   DG Foot Complete Right  Result Date: 03/01/2021 CLINICAL DATA:  Swelling with yellow fluid EXAM: RIGHT FOOT COMPLETE - 3+ VIEW COMPARISON:  None. FINDINGS: No fracture or malalignment. Moderate gas within the dorsal soft tissues of the foot extending to the anterior ankle region concerning for gas forming/necrotizing infection. Possible ulceration of the fifth digit at the level of the IP joint. Possible small erosion at the head of the fifth proximal phalanx. IMPRESSION: 1. Soft tissue swelling. Gas within the soft tissues of the dorsum of the foot and anterior to the ankle raises concern for gas forming/necrotizing infection 2. Possible ulceration of the fifth digit. Suspect small cortical erosion at the head of the fifth proximal phalanx raising concern for osteomyelitis These results will be called to the ordering clinician or representative by the Radiologist Assistant, and communication documented in the PACS or [employer]. Electronically Signed   By: Donavan Foil M.D.   On: 03/01/2021 17:00     CBC Recent Labs  Lab 02/26/21 2000 02/28/21 1305 03/01/21 0624 03/02/21 0443  WBC 11.2* 11.9* 10.1 11.3*  HGB 11.2* 10.9* 9.3* 9.7*  HCT 35.3* 34.6* 29.3* 31.1*  PLT 240 242 226 281  MCV 81.1 81.4 80.5 80.6  MCH 25.7* 25.6* 25.5* 25.1*  MCHC 31.7 31.5 31.7 31.2  RDW 13.4 13.9 14.0 14.1  LYMPHSABS  --  1.1 1.2  --   MONOABS  --  0.9 0.7  --   EOSABS  --  0.0 0.1  --   BASOSABS  --  0.0 0.0  --     Chemistries  Recent Labs  Lab 02/26/21 2000 02/28/21 1305 02/28/21 1917 03/01/21 0624 03/02/21 0443  NA 129* 133*  134* 136 135  K 4.5 4.0 3.7 3.5 3.2*  CL 94* 101 104 105 104  CO2 20* 19* 20* 21* 22  GLUCOSE 603* 441* 400* 253* 207*  BUN 40* 40* 38* 38* 28*  CREATININE 1.68* 1.82* 1.66* 1.77* 1.36*  CALCIUM 9.0 8.8* 8.5* 8.3* 8.4*  AST 24 39  --  59* 138*  ALT 25 38  --  42 86*  ALKPHOS 82 88  --  81 139*  BILITOT 1.1 1.0  --  0.7 0.8   ------------------------------------------------------------------------------------------------------------------ No results for input(s): CHOL, HDL, LDLCALC, TRIG, CHOLHDL, LDLDIRECT in the last 72 hours.  Lab Results  Component Value Date   HGBA1C 13.1 (H) 02/28/2021   ------------------------------------------------------------------------------------------------------------------ No results for input(s): TSH, T4TOTAL, T3FREE, THYROIDAB in the last 72 hours.  Invalid input(s): FREET3 ------------------------------------------------------------------------------------------------------------------ Recent Labs    03/02/21 0443  VITAMINB12 1,334*  FOLATE 9.8  FERRITIN 969*  TIBC 164*  IRON 14*    Coagulation profile No results for input(s): INR, PROTIME in the last 168 hours.  No results for input(s): DDIMER in the last 72 hours.  Cardiac Enzymes No results for input(s): CKMB, TROPONINI, MYOGLOBIN in the last 168 hours.  Invalid input(s): CK ------------------------------------------------------------------------------------------------------------------ No results found for: BNP   Kathie Dike M.D on 03/02/2021 at 8:48 PM  Go to [email or web address] - for contact info  [facility] - Office  [phone]

## 2021-03-03 ENCOUNTER — Encounter (HOSPITAL_COMMUNITY): Payer: Self-pay | Admitting: Internal Medicine

## 2021-03-03 ENCOUNTER — Inpatient Hospital Stay (HOSPITAL_COMMUNITY): Payer: Medicare HMO | Admitting: Anesthesiology

## 2021-03-03 ENCOUNTER — Encounter (HOSPITAL_COMMUNITY)
Admission: EM | Disposition: A | Discharge status: Home / Self Care | Payer: Self-pay | Source: Home / Self Care | Attending: Internal Medicine

## 2021-03-03 HISTORY — PX: AMPUTATION TOE: SHX6595

## 2021-03-03 LAB — GLUCOSE, CAPILLARY
Glucose-Capillary: 187 mg/dL — ABNORMAL HIGH (ref 70–99)
Glucose-Capillary: 151 mg/dL — ABNORMAL HIGH (ref 70–99)
Glucose-Capillary: 135 mg/dL — ABNORMAL HIGH (ref 70–99)
Glucose-Capillary: 157 mg/dL — ABNORMAL HIGH (ref 70–99)
Glucose-Capillary: 188 mg/dL — ABNORMAL HIGH (ref 70–99)
Glucose-Capillary: 157 mg/dL — ABNORMAL HIGH (ref 70–99)

## 2021-03-03 LAB — COMPREHENSIVE METABOLIC PANEL
ALT: 94 U/L — ABNORMAL HIGH (ref 0–44)
AST: 119 U/L — ABNORMAL HIGH (ref 15–41)
Albumin: 2.3 g/dL — ABNORMAL LOW (ref 3.5–5.0)
Alkaline Phosphatase: 163 U/L — ABNORMAL HIGH (ref 38–126)
Anion gap: 9 (ref 5–15)
BUN: 23 mg/dL (ref 8–23)
CO2: 21 mmol/L — ABNORMAL LOW (ref 22–32)
Calcium: 8.3 mg/dL — ABNORMAL LOW (ref 8.9–10.3)
Chloride: 106 mmol/L (ref 98–111)
Creatinine, Ser: 1.41 mg/dL — ABNORMAL HIGH (ref 0.44–1.00)
GFR, Estimated: 42 mL/min — ABNORMAL LOW (ref >60)
Glucose, Bld: 158 mg/dL — ABNORMAL HIGH (ref 70–99)
Potassium: 3.2 mmol/L — ABNORMAL LOW (ref 3.5–5.1)
Sodium: 136 mmol/L (ref 135–145)
Total Bilirubin: 0.5 mg/dL (ref 0.3–1.2)
Total Protein: 6.3 g/dL — ABNORMAL LOW (ref 6.5–8.1)

## 2021-03-03 LAB — SURGICAL PCR SCREEN
MRSA, PCR: NEGATIVE
Staphylococcus aureus: NEGATIVE

## 2021-03-03 LAB — CBC
HCT: 29 % — ABNORMAL LOW (ref 36.0–46.0)
Hemoglobin: 9.3 g/dL — ABNORMAL LOW (ref 12.0–15.0)
MCH: 25.8 pg — ABNORMAL LOW (ref 26.0–34.0)
MCHC: 32.1 g/dL (ref 30.0–36.0)
MCV: 80.3 fL (ref 80.0–100.0)
Platelets: 327 10*3/uL (ref 150–400)
RBC: 3.61 MIL/uL — ABNORMAL LOW (ref 3.87–5.11)
RDW: 14.6 % (ref 11.5–15.5)
WBC: 11.7 10*3/uL — ABNORMAL HIGH (ref 4.0–10.5)
nRBC: 0 % (ref 0.0–0.2)

## 2021-03-03 LAB — LIPASE, BLOOD: Lipase: 23 U/L (ref 11–51)

## 2021-03-03 SURGERY — AMPUTATION, TOE
Anesthesia: General | Site: Toe | Laterality: Right

## 2021-03-03 MED ORDER — SODIUM CHLORIDE 0.9 % IV BOLUS
500.0000 mL | Freq: Once | INTRAVENOUS | Status: AC
  Administered 2021-03-03: 500 mL via INTRAVENOUS

## 2021-03-03 MED ORDER — METOCLOPRAMIDE HCL 5 MG/ML IJ SOLN
INTRAMUSCULAR | Status: AC
  Filled 2021-03-03: qty 2

## 2021-03-03 MED ORDER — PHENYLEPHRINE HCL (PRESSORS) 10 MG/ML IV SOLN
INTRAVENOUS | Status: DC | PRN
  Administered 2021-03-03 (×5): 80 ug via INTRAVENOUS

## 2021-03-03 MED ORDER — ONDANSETRON HCL 4 MG/2ML IJ SOLN
INTRAMUSCULAR | Status: AC
  Filled 2021-03-03: qty 2

## 2021-03-03 MED ORDER — SODIUM CHLORIDE 0.9 % IV SOLN: INTRAVENOUS | Status: DC

## 2021-03-03 MED ORDER — CHLORHEXIDINE GLUCONATE 0.12 % MT SOLN
15.0000 mL | Freq: Once | OROMUCOSAL | Status: AC
  Administered 2021-03-03: 15 mL via OROMUCOSAL

## 2021-03-03 MED ORDER — PHENYLEPHRINE 40 MCG/ML (10ML) SYRINGE FOR IV PUSH (FOR BLOOD PRESSURE SUPPORT)
PREFILLED_SYRINGE | INTRAVENOUS | Status: AC
  Filled 2021-03-03: qty 10

## 2021-03-03 MED ORDER — PROPOFOL 10 MG/ML IV BOLUS
INTRAVENOUS | Status: DC | PRN
  Administered 2021-03-03: 120 mg via INTRAVENOUS

## 2021-03-03 MED ORDER — MIDAZOLAM HCL 2 MG/2ML IJ SOLN
INTRAMUSCULAR | Status: AC
  Filled 2021-03-03: qty 2

## 2021-03-03 MED ORDER — LACTATED RINGERS IV SOLN
INTRAVENOUS | Status: DC
  Administered 2021-03-03: 1000 mL via INTRAVENOUS

## 2021-03-03 MED ORDER — SUCCINYLCHOLINE CHLORIDE 200 MG/10ML IV SOSY
PREFILLED_SYRINGE | INTRAVENOUS | Status: DC | PRN
  Administered 2021-03-03: 120 mg via INTRAVENOUS

## 2021-03-03 MED ORDER — MIDAZOLAM HCL 2 MG/2ML IJ SOLN
INTRAMUSCULAR | Status: DC | PRN
  Administered 2021-03-03: 1 mg via INTRAVENOUS

## 2021-03-03 MED ORDER — CHLORHEXIDINE GLUCONATE 0.12 % MT SOLN
OROMUCOSAL | Status: AC
  Filled 2021-03-03: qty 15

## 2021-03-03 MED ORDER — ENOXAPARIN SODIUM 40 MG/0.4ML IJ SOSY
40.0000 mg | PREFILLED_SYRINGE | INTRAMUSCULAR | Status: DC
  Administered 2021-03-04 – 2021-03-08 (×5): 40 mg via SUBCUTANEOUS
  Filled 2021-03-03 (×5): qty 0.4

## 2021-03-03 MED ORDER — PROPOFOL 10 MG/ML IV BOLUS
INTRAVENOUS | Status: AC
  Filled 2021-03-03: qty 20

## 2021-03-03 MED ORDER — SODIUM CHLORIDE 0.9 % IV SOLN
2.0000 g | Freq: Two times a day (BID) | INTRAVENOUS | Status: DC
  Administered 2021-03-04 – 2021-03-08 (×9): 2 g via INTRAVENOUS
  Filled 2021-03-03 (×9): qty 2

## 2021-03-03 MED ORDER — ORAL CARE MOUTH RINSE: 15.0000 mL | Freq: Once | OROMUCOSAL | Status: AC

## 2021-03-03 MED ORDER — FENTANYL CITRATE (PF) 100 MCG/2ML IJ SOLN
INTRAMUSCULAR | Status: AC
  Filled 2021-03-03: qty 2

## 2021-03-03 MED ORDER — LIDOCAINE HCL (PF) 2 % IJ SOLN
INTRAMUSCULAR | Status: AC
  Filled 2021-03-03: qty 5

## 2021-03-03 MED ORDER — SODIUM CHLORIDE FLUSH 0.9 % IV SOLN
INTRAVENOUS | Status: AC
  Filled 2021-03-03: qty 10

## 2021-03-03 MED ORDER — ONDANSETRON HCL 4 MG/2ML IJ SOLN
INTRAMUSCULAR | Status: DC | PRN
  Administered 2021-03-03: 4 mg via INTRAVENOUS

## 2021-03-03 MED ORDER — SODIUM CHLORIDE 0.9 % IV SOLN
2.0000 g | Freq: Once | INTRAVENOUS | Status: AC
  Administered 2021-03-03: 2 g via INTRAVENOUS
  Filled 2021-03-03: qty 2

## 2021-03-03 MED ORDER — SODIUM CHLORIDE 0.9 % IR SOLN
Status: DC | PRN
  Administered 2021-03-03: 1000 mL

## 2021-03-03 MED ORDER — LIDOCAINE HCL (CARDIAC) PF 100 MG/5ML IV SOSY
PREFILLED_SYRINGE | INTRAVENOUS | Status: DC | PRN
  Administered 2021-03-03: 100 mg via INTRATRACHEAL

## 2021-03-03 MED ORDER — SUCCINYLCHOLINE CHLORIDE 200 MG/10ML IV SOSY
PREFILLED_SYRINGE | INTRAVENOUS | Status: AC
  Filled 2021-03-03: qty 10

## 2021-03-03 MED ORDER — ONDANSETRON HCL 4 MG/2ML IJ SOLN: 4.0000 mg | Freq: Once | INTRAMUSCULAR | Status: DC | PRN

## 2021-03-03 MED ORDER — HYDROCODONE-ACETAMINOPHEN 5-325 MG PO TABS: 1.0000 | ORAL_TABLET | ORAL | Status: DC | PRN

## 2021-03-03 MED ORDER — HYDROMORPHONE HCL 1 MG/ML IJ SOLN: 0.2500 mg | INTRAMUSCULAR | Status: DC | PRN

## 2021-03-03 MED ORDER — SEVOFLURANE IN SOLN
RESPIRATORY_TRACT | Status: AC
  Filled 2021-03-03: qty 250

## 2021-03-03 MED ORDER — METOCLOPRAMIDE HCL 10 MG PO TABS
5.0000 mg | ORAL_TABLET | Freq: Three times a day (TID) | ORAL | Status: DC
  Administered 2021-03-04 – 2021-03-08 (×14): 5 mg via ORAL
  Filled 2021-03-03 (×14): qty 1

## 2021-03-03 MED ORDER — METOCLOPRAMIDE HCL 5 MG/ML IJ SOLN
10.0000 mg | Freq: Once | INTRAMUSCULAR | Status: AC
  Administered 2021-03-03: 10 mg via INTRAVENOUS

## 2021-03-03 MED ORDER — FENTANYL CITRATE (PF) 100 MCG/2ML IJ SOLN
INTRAMUSCULAR | Status: DC | PRN
  Administered 2021-03-03: 25 ug via INTRAVENOUS
  Administered 2021-03-03: 50 ug via INTRAVENOUS

## 2021-03-03 SURGICAL SUPPLY — 28 items
BLADE OSC/SAGITTAL MD 9X18.5 (BLADE) IMPLANT
BLADE SAW GIGLI 510 (BLADE) IMPLANT
BNDG GAUZE ELAST 4 BULKY (GAUZE/BANDAGES/DRESSINGS) ×1 IMPLANT
CLOTH BEACON ORANGE TIMEOUT ST (SAFETY) ×2 IMPLANT
COVER LIGHT HANDLE STERIS (MISCELLANEOUS) ×4 IMPLANT
ELECT REM PT RETURN 9FT ADLT (ELECTROSURGICAL) ×2
ELECTRODE REM PT RTRN 9FT ADLT (ELECTROSURGICAL) ×1 IMPLANT
GAUZE IODOFORM PACK 1/2 7832 (GAUZE/BANDAGES/DRESSINGS) ×1 IMPLANT
GAUZE SPONGE 4X4 12PLY STRL (GAUZE/BANDAGES/DRESSINGS) ×2 IMPLANT
GAUZE XEROFORM 1X8 LF (GAUZE/BANDAGES/DRESSINGS) IMPLANT
GLOVE SURG SS PI 7.5 STRL IVOR (GLOVE) ×2 IMPLANT
GLOVE SURG UNDER POLY LF SZ7 (GLOVE) ×6 IMPLANT
GOWN STRL REUS W/TWL LRG LVL3 (GOWN DISPOSABLE) ×6 IMPLANT
INST SET MINOR BONE (KITS) ×2 IMPLANT
KIT TURNOVER KIT A (KITS) ×2 IMPLANT
MANIFOLD NEPTUNE II (INSTRUMENTS) ×2 IMPLANT
NS IRRIG 1000ML POUR BTL (IV SOLUTION) ×2 IMPLANT
PACK BASIC LIMB (CUSTOM PROCEDURE TRAY) ×2 IMPLANT
PAD ARMBOARD 7.5X6 YLW CONV (MISCELLANEOUS) ×2 IMPLANT
SET BASIN LINEN APH (SET/KITS/TRAYS/PACK) ×2 IMPLANT
SOL PREP PROV IODINE SCRUB 4OZ (MISCELLANEOUS) ×2 IMPLANT
SPONGE T-LAP 18X18 ~~LOC~~+RFID (SPONGE) ×3 IMPLANT
SUT ETHILON 3 0 FSL (SUTURE) IMPLANT
SUT PROLENE 2 0 FS (SUTURE) IMPLANT
SUT VIC AB 3-0 SH 27 (SUTURE) ×2
SUT VIC AB 3-0 SH 27XBRD (SUTURE) ×1 IMPLANT
SWAB CULTURE ESWAB REG 1ML (MISCELLANEOUS) ×1 IMPLANT
TAPE CLOTH SURG 4X10 WHT LF (GAUZE/BANDAGES/DRESSINGS) ×1 IMPLANT

## 2021-03-03 NOTE — Progress Notes (Signed)
Pharmacy Antibiotic Note  Nicole Golden is a 61 y.o. female admitted on 02/28/2021 with cellulitis.  Pharmacy has been consulted for Vancomycin dosing. Pt s//p toe amputation earlier today, pharmacy to add cefepime.  Plan: Vancomycin 1500mg  x 1 then 1000mg  IV Q 24 hrs. Goal AUC 400-550. Cefepime 2g q12h  Height: 5\' 8"  (172.7 cm) Weight: 86.8 kg (191 lb 5.8 oz) IBW/kg (Calculated) : 63.9  Temp (24hrs), Avg:99.3 F (37.4 C), Min:98.2 F (36.8 C), Max:100.8 F (38.2 C)  Recent Labs  Lab 02/26/21 2000 02/28/21 1305 02/28/21 1917 03/01/21 0624 03/02/21 0443 03/03/21 0538  WBC 11.2* 11.9*  --  10.1 11.3* 11.7*  CREATININE 1.68* 1.82* 1.66* 1.77* 1.36* 1.41*     Estimated Creatinine Clearance: 48.4 mL/min (A) (by C-G formula based on SCr of 1.41 mg/dL (H)).    No Known Allergies  Antimicrobials this admission: Vanc 7/5 >>   Dose adjustments this admission:   Microbiology results:  BCx: pending  UCx: pending   Sputum: pending   MRSA PCR: pending  Thank you for allowing pharmacy to be a part of this patient's care.  Einar Grad 03/03/2021 6:38 PM

## 2021-03-03 NOTE — Interval H&P Note (Signed)
History and Physical Interval Note:  03/03/2021 6:58 AM  Nicole Golden  has presented today for surgery, with the diagnosis of gangrene right fifth toe.  The various methods of treatment have been discussed with the patient and family. After consideration of risks, benefits and other options for treatment, the patient has consented to  Procedure(s): AMPUTATION TOE (Right) as a surgical intervention.  The patient's history has been reviewed, patient examined, no change in status, stable for surgery.  I have reviewed the patient's chart and labs.  Questions were answered to the patient's satisfaction.     Aviva Signs

## 2021-03-03 NOTE — Anesthesia Procedure Notes (Signed)
Procedure Name: Intubation Date/Time: 03/03/2021 7:39 AM Performed by: Karna Dupes, CRNA Pre-anesthesia Checklist: Patient identified, Emergency Drugs available, Suction available and Patient being monitored Patient Re-evaluated:Patient Re-evaluated prior to induction Oxygen Delivery Method: Circle system utilized Preoxygenation: Pre-oxygenation with 100% oxygen Induction Type: IV induction Laryngoscope Size: Mac and 3 Grade View: Grade I Tube type: Oral Tube size: 7.0 mm Number of attempts: 1 Airway Equipment and Method: Stylet and Oral airway Placement Confirmation: ETT inserted through vocal cords under direct vision, positive ETCO2 and breath sounds checked- equal and bilateral Secured at: 21 cm Tube secured with: Tape Dental Injury: Teeth and Oropharynx as per pre-operative assessment

## 2021-03-03 NOTE — Op Note (Signed)
Patient:  Nicole Golden  DOB:  [birth year]  MRN:  [id number]   Preop Diagnosis: Gangrene, right fifth toe  Postop Diagnosis: Same  Procedure: Right fifth toe amputation  Surgeon: Aviva Signs, MD  Anes: General  Indications: Patient is a 60 year old black female who presented to [facility] with uncontrolled diabetes mellitus.  She was also found on physical examination to have cellulitis and gangrene of the right fifth toe.  The risks and benefits of the procedure including bleeding, infection, and the possibility of needing further surgery were fully explained to the patient, who gave informed consent.  Procedure note: The patient was placed in the supine position.  After induction of general endotracheal anesthesia, the right foot was prepped and draped using the usual sterile technique with Betadine.  Surgical site confirmation was performed.  An elliptical incision was made around the base of the right fifth toe.  Aerobic and anaerobic cultures were taken and sent to microbiology.  The purulent fluid tract of the fifth metatarsal bone.  I did amputate the right fifth toe along with the metatarsal head.  It was sent to pathology for further examination.  The wound was irrigated with normal saline.  A bleeding was controlled using Bovie electrocautery.  The wound was packed with 1/2 inch iodoform Nu Gauze.  Dry sterile dressing was applied.  All tape and needle counts were correct at the end of the procedure.  Patient was extubated in the operating room and transferred to PACU in stable condition.  Complications: None  EBL: Minimal  Specimen: Right fifth toe

## 2021-03-03 NOTE — Transfer of Care (Signed)
Immediate Anesthesia Transfer of Care Note  Patient: Nicole Golden  Procedure(s) Performed: AMPUTATION TOE (Right: Toe)  Patient Location: PACU  Anesthesia Type:General  Level of Consciousness: awake, alert  and oriented  Airway & Oxygen Therapy: Patient Spontanous Breathing and Patient connected to nasal cannula oxygen  Post-op Assessment: Report given to RN and Post -op Vital signs reviewed and stable  Post vital signs: Reviewed and stable  Last Vitals:  Vitals Value Taken Time  BP 118/58 03/03/21 0825  Temp    Pulse 88 03/03/21 0827  Resp 19 03/03/21 0827  SpO2 96 % 03/03/21 0827  Vitals shown include unvalidated device data.  Last Pain:  Vitals:   03/03/21 0655  TempSrc: Oral  PainSc: 0-No pain      Patients Stated Pain Goal: 8 (93/81/01 7510)  Complications: No notable events documented.

## 2021-03-03 NOTE — Anesthesia Postprocedure Evaluation (Signed)
Anesthesia Post Note  Patient: Nicole Golden  Procedure(s) Performed: AMPUTATION TOE (Right: Toe)  Patient location during evaluation: PACU Anesthesia Type: General Level of consciousness: awake and alert and oriented Pain management: pain level controlled Vital Signs Assessment: post-procedure vital signs reviewed and stable Respiratory status: spontaneous breathing and respiratory function stable Cardiovascular status: blood pressure returned to baseline and stable Postop Assessment: no apparent nausea or vomiting Anesthetic complications: no   No notable events documented.   Last Vitals:  Vitals:   03/03/21 0915 03/03/21 0933  BP: (!) 166/69 (!) 160/72  Pulse: 99 (!) 103  Resp: (!) 24 20  Temp:  37.6 C  SpO2: 92% 92%    Last Pain:  Vitals:   03/03/21 0933  TempSrc: Oral  PainSc: 0-No pain                 Karri Kallenbach C Eran Mistry

## 2021-03-03 NOTE — Progress Notes (Addendum)
Patient Demographics:    Nicole Golden, is a 60 y.o. female, DOB - 10/08/1960, NWG:[id number]  Admit date - 02/28/2021   Admitting Physician [name], MD  Outpatient Primary MD for the patient is Nicole Moro, DO  LOS - 3   No chief complaint on file.       Subjective:    Nicole Golden she is seen in her room postoperatively, she feels tired at this time.  Denies any worsening pain.  Overall nausea is better.  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 ---Rt   Gallstones   Gangrene of toe of right foot ([place])  Brief Summary:- 60 year old with past medical history relevant for uncontrolled DM with peripheral neuropathy, HTN, HLD, CKD 3B, and chronic right small toe wound admitted on 02/28/2021 with sepsis secondary to right foot cellulitis after presenting  to the ED with malaise, fatigue, fevers, nausea and vomiting for the last 4 days - A/p 1)Sepsis Secondary to Right Foot Cellulitis/OsteoMyelits --POA--patient met sepsis criteria on admission with WBC of 11.9, fever of 102.7, tachycardia with heart rate of 110 tachypnea with respiratory rate of 22- -Plain films of right foot shows possible osteomyelitis of fifth digit concern for necrotizing infection --IV vancomycin per cellulitis order set -Since initial wound culture shows gram-positive cocci and gram-negative rods, will also add cefepime for now -Seen by general surgery and underwent amputation of right fifth digit on 7/7 -Patient with significant right foot peripheral neuropathy in the setting of uncontrolled DM--- despite probing  the wound and obtaining wound cultures patient had No foot pain  2)Cholelithiasis without cholecystitis--- LFTs noted -CT abdomen and pelvis and MRCP noted, no biliary dilatation or choledocholithiasis -Discussed with general surgeon, no need for inpatient intervention at this time -Patient may benefit from outpatient consultation for possible elective lap chole  Hepatic Function Latest Ref Rng & Units 03/03/2021 03/02/2021 03/01/2021  Total Protein 6.5 - 8.1 g/dL 6.3(L) 6.4(L) 5.9(L)  Albumin 3.5 - 5.0 g/dL 2.3(L) 2.3(L) 2.3(L)  AST 15 - 41 U/L 119(H) 138(H) 59(H)  ALT 0 - 44 U/L 94(H) 86(H) 42  Alk Phosphatase 38 - 126 U/L 163(H) 139(H) 81  Total Bilirubin 0.3 - 1.2 mg/dL 0.5 0.8 0.7  Bilirubin, Direct 0.0 - 0.2 mg/dL - - 0.2   3)Acute pancreatitis with nausea and vomiting and abdominal pain---- -=patient with H/o  idiopathic pancreatitis, -Lipase is up to73, but has since normalized  no EtOH use  - Has nausea and vomiting----secondary to #1 and #2 above -Improved with IV fluids and IV antiemetics -She is tolerating heart healthy diet -Abdominal imaging studies as noted above in #2 without acute findings -Will need to follow-up with GI and general surgery as an outpatient -LFTs currently elevated, possibly reactive -No biliary obstruction noted on imaging and overall nausea, vomiting abdominal pain improved -Hold statin -Recheck in a.m.  4)Aki on CKD stage - 3B -Creatinine noted to be elevated on admission, up to 1.8, creatinine  was 1.45 on 02/07/2021 --- renally adjust medications, avoid nephrotoxic agents / dehydration  / hypotension -She has been hydrated with IV fluids and creatinine has returned to baseline  5)HTN--BPs not at goal, increase amlodipine to 10 mg daily, -- hold Lasix, hold losartan and HCTZ due to sepsis and AKI -May use IV labetalol as needed elevated BP  6)DM2--A1c previously greater than 12 reflecting uncontrolled DM with hyperglycemia -Continue Lantus  insulin Use Novolog/Humalog Sliding scale insulin with Accu-Cheks/Fingersticks as ordered  -Overall blood sugars been stable  7) chronic anemia most likely secondary to CKD--- hemoglobin is down to 9.7 from a baseline usually above 11 --No evidence of ongoing bleeding -Anemia work-up/labs including stool occult blood ordered and pending -May benefit from ESA/EPO agent intermittently  Disposition/Need for in-Hospital Stay- patient unable to be discharged at this time due to sepsis secondary to right foot cellulitis and abscess in a diabetic patient requiring IV antibiotics and IVF  Status is: Inpatient  Remains inpatient appropriate because: See disposition above  Disposition: The patient is from: Home              Anticipated d/c is to: Home              Anticipated d/c date is: 3 days              Patient currently is not medically stable to d/c. Barriers: Not Clinically Stable-   Code Status : -  Code Status: Full Code   Family Communication:   (patient is alert, awake and coherent)  Consults  :  Gen surgery  DVT Prophylaxis  :   - SCDs  Place and maintain sequential compression device Start: 02/28/21 1850    Lab Results  Component Value Date   PLT 327 03/03/2021    Inpatient Medications  Scheduled Meds:  amLODipine  10 mg Oral Daily   aspirin EC  81 mg Oral Q breakfast   chlorhexidine       insulin aspart  0-9 Units Subcutaneous Q4H   insulin glargine  60 Units Subcutaneous QHS   [START ON 03/04/2021] metoCLOPramide  5 mg Oral TID AC   rosuvastatin  40 mg Oral q1800   Continuous Infusions:  sodium chloride     metronidazole 500 mg (03/03/21 1203)   vancomycin 1,250 mg (03/02/21 2044)   PRN Meds:.acetaminophen, hydrALAZINE, labetalol, ondansetron **OR** ondansetron (ZOFRAN) IV    Anti-infectives (From admission, onward)    Start     Dose/Rate Route Frequency Ordered Stop   03/02/21 1800  vancomycin (VANCOCIN) IVPB 1000 mg/200 mL premix  Status:  Discontinued         1,000 mg 200 mL/hr over 60 Minutes Intravenous Every 24 hours 03/01/21 1630 03/02/21 0852   03/02/21 1800  vancomycin (VANCOREADY) IVPB 1250 mg/250 mL        1,250 mg 166.7 mL/hr over 90 Minutes Intravenous Every 24 hours 03/02/21 0852     03/01/21 1800  metroNIDAZOLE (FLAGYL) IVPB 500 mg        500 mg 100 mL/hr over 60 Minutes Intravenous Every 8 hours 03/01/21 1647     03/01/21 1730  vancomycin (VANCOREADY) IVPB 1500 mg/300 mL        1,500 mg 150 mL/hr over 120 Minutes Intravenous  Once 03/01/21 1624 03/01/21 1856   03/01/21 1715  vancomycin (VANCOCIN) IVPB 1000 mg/200 mL premix  Status:  Discontinued        1,000 mg 200 mL/hr over 60 Minutes Intravenous  Once 03/01/21  1622 03/01/21 1623         Objective:   Vitals:   03/03/21 0901 03/03/21 0915 03/03/21 0933 03/03/21 1800  BP:  (!) 166/69 (!) 160/72 (!) 155/51  Pulse:  99 (!) 103 (!) 110  Resp:  (!) 24 20 20   Temp: 98.2 F (36.8 C)  99.6 F (37.6 C) (!) 100.8 F (38.2 C)  TempSrc:   Oral Oral  SpO2:  92% 92% 95%  Weight:      Height:        Wt Readings from Last 3 Encounters:  03/03/21 86.8 kg  02/12/21 86.2 kg  02/07/21 86.2 kg     Intake/Output Summary (Last 24 hours) at 03/03/2021 1829 Last data filed at 03/03/2021 2585 Gross per 24 hour  Intake 1250 ml  Output 5 ml  Net 1245 ml     Physical Exam  General exam: Alert, awake, oriented x 3 Respiratory system: Clear to auscultation. Respiratory effort normal. Cardiovascular system:RRR. No murmurs, rubs, gallops. Gastrointestinal system: Abdomen is nondistended, soft and nontender. No organomegaly or masses felt. Normal bowel sounds heard. Central nervous system: Alert and oriented. No focal neurological deficits. Extremities: No C/C/E, +pedal pulses Skin: Gangrenous fifth toe on right foot with purulent drainage noted. Psychiatry: Judgement and insight appear normal. Mood & affect appropriate.     Data Review:   Micro Results Recent Results  (from the past 240 hour(s))  SARS CORONAVIRUS 2 (TAT 6-24 HRS) Nasopharyngeal Nasopharyngeal Swab     Status: None   Collection Time: 02/28/21  8:58 PM   Specimen: Nasopharyngeal Swab  Result Value Ref Range Status   SARS Coronavirus 2 NEGATIVE NEGATIVE Final    Comment: (NOTE) SARS-CoV-2 target nucleic acids are NOT DETECTED.  The SARS-CoV-2 RNA is generally detectable in upper and lower respiratory specimens during the acute phase of infection. Negative results do not preclude SARS-CoV-2 infection, do not rule out co-infections with other pathogens, and should not be used as the sole basis for treatment or other patient management decisions. Negative results must be combined with clinical observations, patient history, and epidemiological information. The expected result is Negative.  Fact Sheet for Patients: SugarRoll.be  Fact Sheet for Healthcare Providers: https://www.woods-mathews.com/  This test is not yet approved or cleared by the [place] FDA and  has been authorized for detection and/or diagnosis of SARS-CoV-2 by FDA under an Emergency Use Authorization (EUA). This EUA will remain  in effect (meaning this test can be used) for the duration of the COVID-19 declaration under Se ction 564(b)(1) of the Act, 21 U.S.C. section 360bbb-3(b)(1), unless the authorization is terminated or revoked sooner.  Performed at [facility], [place] [address] [place], [place] [address]   Aerobic/Anaerobic Culture w Gram Stain (surgical/deep wound)     Status: None (Preliminary result)   Collection Time: 03/01/21  4:20 PM   Specimen: Foot; Abscess  Result Value Ref Range Status   Specimen Description   Final    FOOT Performed at [facility], [address], [place], [place] [address]    Special Requests   Final    NONE Performed at [facility], [address], [place], [place] [address]    Gram Stain   Final    RARE WBC PRESENT,  PREDOMINANTLY PMN ABUNDANT GRAM POSITIVE COCCI    Culture   Final    ABUNDANT GRAM NEGATIVE RODS IDENTIFICATION AND SUSCEPTIBILITIES TO FOLLOW HOLDING FOR POSSIBLE ANAEROBE Performed at [facility], [place] [address],  [place], [place] [address]    Report Status PENDING  Incomplete  Culture, blood (Routine X 2) w Reflex to ID Panel     Status: None (Preliminary result)   Collection Time: 03/01/21  5:57 PM   Specimen: BLOOD RIGHT ARM  Result Value Ref Range Status   Specimen Description BLOOD RIGHT ARM  Final   Special Requests   Final    BOTTLES DRAWN AEROBIC AND ANAEROBIC Blood Culture adequate volume   Culture   Final    NO GROWTH 2 DAYS Performed at [facility], [address] [place], [place] [address]    Report Status PENDING  Incomplete  Culture, blood (Routine X 2) w Reflex to ID Panel     Status: None (Preliminary result)   Collection Time: 03/01/21  5:57 PM   Specimen: BLOOD LEFT HAND  Result Value Ref Range Status   Specimen Description BLOOD LEFT HAND  Final   Special Requests   Final    BOTTLES DRAWN AEROBIC AND ANAEROBIC Blood Culture adequate volume   Culture   Final    NO GROWTH 2 DAYS Performed at [facility], [address], [place], [place] [address]    Report Status PENDING  Incomplete  Surgical pcr screen     Status: None   Collection Time: 03/03/21  5:03 AM   Specimen: Nasal Mucosa; Nasal Swab  Result Value Ref Range Status   MRSA, PCR NEGATIVE NEGATIVE Final   Staphylococcus aureus NEGATIVE NEGATIVE Final    Comment: (NOTE) The Xpert SA Assay (FDA approved for NASAL specimens in patients 25 years of age and older), is one component of a comprehensive surveillance program. It is not intended to diagnose infection nor to guide or monitor treatment. Performed at [facility], [address] [place], [place] [address]   Aerobic/Anaerobic Culture w Gram Stain (surgical/deep wound)     Status: None (Preliminary result)   Collection Time: 03/03/21   8:50 AM   Specimen: PATH Other; Tissue  Result Value Ref Range Status   Specimen Description   Final    WOUND Performed at [facility], [address], [place], [place] [address]    Special Requests   Final    NONE Performed at [facility], [address], [place], [place] [address]    Gram Stain   Final    RARE WBC PRESENT,BOTH PMN AND MONONUCLEAR ABUNDANT GRAM POSITIVE COCCI FEW GRAM VARIABLE ROD Performed at [facility], [place] [address], [place], [place] [address]    Culture PENDING  Incomplete   Report Status PENDING  Incomplete    Radiology Reports CT ABDOMEN PELVIS WO CONTRAST  Result Date: 02/28/2021 CLINICAL DATA:  Nonlocalized acute abdominal pain. EXAM: CT ABDOMEN AND PELVIS WITHOUT CONTRAST TECHNIQUE: Multidetector CT imaging of the abdomen and pelvis was performed following the standard protocol without IV contrast. COMPARISON:  Ultrasound pelvis 02/04/2018. FINDINGS: Lower chest: No acute abnormality. Hepatobiliary: No focal liver abnormality. Tiny calcified stones noted within the gallbladder lumen. No gallbladder wall thickening or pericholecystic fluid. No biliary dilatation. Pancreas: No focal lesion. Slight hazy pancreatic contour likely due to motion artifact. Otherwise normal pancreatic contour. No surrounding inflammatory changes. No main pancreatic ductal dilatation. Spleen: Normal in size without focal abnormality. A splenule is noted. Adrenals/Urinary Tract: No adrenal nodule bilaterally. Bilateral kidneys enhance symmetrically. No hydronephrosis. No hydroureter. The urinary bladder is unremarkable. Stomach/Bowel: Stomach is within normal limits. No evidence of bowel wall thickening or dilatation. Appendix appears normal. Vascular/Lymphatic: No abdominal aorta  or iliac aneurysm. At least moderate atherosclerotic plaque of the aorta and its branches. No abdominal, pelvic, or inguinal lymphadenopathy. Reproductive: Redemonstration of a coarsely calcified left  subserosal fibroid measuring up to at least 5.5 cm. A central coarsely calcified or in lesion is also noted likely representing known submucosal uterine fibroid. Bilateral adnexa are otherwise unremarkable. Other: No intraperitoneal free fluid. No intraperitoneal free gas. No organized fluid collection. Musculoskeletal: No abdominal wall hernia or abnormality. No suspicious lytic or blastic osseous lesions. No acute displaced fracture. IMPRESSION: 1. Slight hazy pancreatic contour likely due to motion artifact. Differential diagnosis includes acute pancreatitis. Recommend correlation with lipase levels. 2. Cholelithiasis no CT findings of acute cholecystitis. 3. Subserosal and submucosal degenerative uterine fibroids. 4. Please note limited evaluation on this noncontrast study. Electronically Signed   By: Iven Finn M.D.   On: 02/28/2021 17:06   MR ABDOMEN MRCP WO CONTRAST  Result Date: 03/01/2021 CLINICAL DATA:  Abdominal pain.  Cholelithiasis. EXAM: MRI ABDOMEN WITHOUT CONTRAST  (INCLUDING MRCP) TECHNIQUE: Multiplanar multisequence MR imaging of the abdomen was performed. Heavily T2-weighted images of the biliary and pancreatic ducts were obtained, and three-dimensional MRCP images were rendered by post processing. COMPARISON:  CT abdomen 02/28/2021 FINDINGS: Despite efforts by the technologist and patient, motion artifact is present on today's exam and could not be eliminated. This reduces exam sensitivity and specificity. Lower chest: Unremarkable Hepatobiliary: The small dependent gallstone in the gallbladder was better seen at CT. No biliary dilatation. No discrete filling defect in the common bile duct which measures 0.5 cm in diameter and which demonstrates normal conical tapering. No significant focal liver lesion identified. Pancreas:  Unremarkable Spleen:  Unremarkable Adrenals/Urinary Tract:  Both adrenal glands appear normal. 0.9 cm fluid density lesion of the left mid kidney anteriorly on image  21 series 4, probably a cyst. Similar 0.7 cm lesion of the right kidney upper pole on image 25 series 5. Stomach/Bowel: Unremarkable Vascular/Lymphatic:  Aortoiliac atherosclerotic vascular disease. Other: Uterine fibroids are observed on the coronal T2 weighted images. Musculoskeletal: Unremarkable IMPRESSION: 1. No biliary dilatation or appreciable choledocholithiasis on today's examination. The patient has known cholelithiasis based on recent CT although previously seen gallstone is less conspicuous on today's MRI. 2. Small fluid density renal lesions favoring cysts. 3.  Aortic Atherosclerosis (ICD10-I70.0). 4. Uterine fibroids. Electronically Signed   By: Van Clines M.D.   On: 03/01/2021 10:47   MR 3D Recon At Scanner  Result Date: 03/02/2021 : Please see accession [id number] [facility] for MRCP report. Electronically Signed   By: Van Clines M.D.   On: 03/02/2021 08:06   US ARTERIAL SEG MULTIPLE LE (ABI, SEGMENTAL PRESSURES, PVR'S)  Result Date: 03/02/2021 CLINICAL DATA:  Right toe gangrene EXAM: NONINVASIVE PHYSIOLOGIC VASCULAR STUDY OF BILATERAL LOWER EXTREMITIES TECHNIQUE: Non-invasive vascular evaluation of both lower extremities was performed at rest, including calculation of ankle-brachial indices, multiple segmental pressure evaluation, segmental Doppler and segmental pulse volume recording. COMPARISON:  None. FINDINGS: Right Lower Extremity Resting ABI:  0.73 Segmental Pressures: Significant pressure gradient between the upper and lower thigh cuff consistent with SFA disease. Additionally, pressure gradients are present between the calf and ankle cuff consistent with below the knee disease. Arterial Waveforms: Transition to abnormal monophasic arterial waveform in the popliteal artery consistent with SFA occlusive disease. PVRs: Markedly blunted PVRs in the digits likely reflects severe small vessel disease. Left Lower Extremity: Resting ABI: 0.91 Segmental Pressures: Pressure gradient  between the lower thigh and calf cuffs consistent with distal femoropopliteal disease. Arterial  Waveforms: Irregular arterial waveforms likely affected by artifact. PVRs: Digital waveforms are relatively preserved. Other: Symmetric upper extremity pressures. Ankle Brachial index > 1.4 Non diagnostic secondary to incompressible vessel calcifications 1.0-1.4       Normal 0.9-0.99     Borderline PAD 0.8-0.89     Mild PAD 0.5-0.79     Moderate PAD < 0.5          Severe PAD Toe Brachial Index Normal     >0.65 Moderate  0.53-0.64 Severe     <0.23 Toe Pressures Absolute toe pressure >33mHg sufficient for wound healing. Toe pressures <552mg = critical limb ischemia. IMPRESSION: 1. Abnormal resting right ankle-brachial index of 0.73 consistent with at least moderate peripheral arterial disease. 2. Segmental evaluation suggests outflow (superficial femoral) occlusive disease as well as runoff (below the knee) disease. Additionally, there is significant diminution of the arterial waveforms in the digits consistent with small vessel disease. 3. Resting left ankle-brachial index of 0.91 consistent with borderline peripheral arterial disease. 4. Segmental evaluation suggests an element of stenosis in the distal femoropopliteal segment on the left. Signed, HeCriselda PeachesMD, RPTiogaascular and Interventional Radiology Specialists [facility] Electronically Signed   By: HeJacqulynn Cadet.D.   On: 03/02/2021 12:08   DG Abd Acute W/Chest  Result Date: 02/28/2021 CLINICAL DATA:  Vomiting. EXAM: DG ABDOMEN ACUTE WITH 1 VIEW CHEST COMPARISON:  None. FINDINGS: There is no evidence of dilated bowel loops or free intraperitoneal air. Calcified uterine fibroid is noted in the pelvis. Heart size and mediastinal contours are within normal limits. Both lungs are clear. IMPRESSION: No abnormal bowel dilatation.  No acute cardiopulmonary disease. Electronically Signed   By: JaMarijo Conception.D.   On: 02/28/2021 13:39   DG  Foot Complete Right  Result Date: 03/01/2021 CLINICAL DATA:  Swelling with yellow fluid EXAM: RIGHT FOOT COMPLETE - 3+ VIEW COMPARISON:  None. FINDINGS: No fracture or malalignment. Moderate gas within the dorsal soft tissues of the foot extending to the anterior ankle region concerning for gas forming/necrotizing infection. Possible ulceration of the fifth digit at the level of the IP joint. Possible small erosion at the head of the fifth proximal phalanx. IMPRESSION: 1. Soft tissue swelling. Gas within the soft tissues of the dorsum of the foot and anterior to the ankle raises concern for gas forming/necrotizing infection 2. Possible ulceration of the fifth digit. Suspect small cortical erosion at the head of the fifth proximal phalanx raising concern for osteomyelitis These results will be called to the ordering clinician or representative by the Radiologist Assistant, and communication documented in the PACS or ClFrontier Oil CorporationElectronically Signed   By: KiDonavan Foil.D.   On: 03/01/2021 17:00     CBC Recent Labs  Lab 02/26/21 2000 02/28/21 1305 03/01/21 0624 03/02/21 0443 03/03/21 0538  WBC 11.2* 11.9* 10.1 11.3* 11.7*  HGB 11.2* 10.9* 9.3* 9.7* 9.3*  HCT 35.3* 34.6* 29.3* 31.1* 29.0*  PLT 240 242 226 281 327  MCV 81.1 81.4 80.5 80.6 80.3  MCH 25.7* 25.6* 25.5* 25.1* 25.8*  MCHC 31.7 31.5 31.7 31.2 32.1  RDW 13.4 13.9 14.0 14.1 14.6  LYMPHSABS  --  1.1 1.2  --   --   MONOABS  --  0.9 0.7  --   --   EOSABS  --  0.0 0.1  --   --   BASOSABS  --  0.0 0.0  --   --     Chemistries  Recent Labs  Lab 02/26/21 2000 02/28/21 1305 02/28/21 1917 03/01/21 0624 03/02/21 0443 03/03/21 0538  NA 129* 133* 134* 136 135 136  K 4.5 4.0 3.7 3.5 3.2* 3.2*  CL 94* 101 104 105 104 106  CO2 20* 19* 20* 21* 22 21*  GLUCOSE 603* 441* 400* 253* 207* 158*  BUN 40* 40* 38* 38* 28* 23  CREATININE 1.68* 1.82* 1.66* 1.77* 1.36* 1.41*  CALCIUM 9.0 8.8* 8.5* 8.3* 8.4* 8.3*  AST 24 39  --  59* 138*  119*  ALT 25 38  --  42 86* 94*  ALKPHOS 82 88  --  81 139* 163*  BILITOT 1.1 1.0  --  0.7 0.8 0.5   ------------------------------------------------------------------------------------------------------------------ No results for input(s): CHOL, HDL, LDLCALC, TRIG, CHOLHDL, LDLDIRECT in the last 72 hours.  Lab Results  Component Value Date   HGBA1C 13.1 (H) 02/28/2021   ------------------------------------------------------------------------------------------------------------------ No results for input(s): TSH, T4TOTAL, T3FREE, THYROIDAB in the last 72 hours.  Invalid input(s): FREET3 ------------------------------------------------------------------------------------------------------------------ Recent Labs    03/02/21 0443  VITAMINB12 1,334*  FOLATE 9.8  FERRITIN 969*  TIBC 164*  IRON 14*    Coagulation profile No results for input(s): INR, PROTIME in the last 168 hours.  No results for input(s): DDIMER in the last 72 hours.  Cardiac Enzymes No results for input(s): CKMB, TROPONINI, MYOGLOBIN in the last 168 hours.  Invalid input(s): CK ------------------------------------------------------------------------------------------------------------------ No results found for: BNP   Kathie Dike M.D on 03/03/2021 at 6:29 PM  Go to [email or web address] - for contact info  [facility] - Office  [phone]

## 2021-03-03 NOTE — Progress Notes (Signed)
Floor unable to take report at this time will call back

## 2021-03-03 NOTE — Progress Notes (Signed)
Took patient IS.  Patient did not act like she felt good.  Patient was listening to me explain how to use the Incentive Spirometer and would nod her head in response.  When I asked her to at least do 5 to see if she understood, patient shook her head no and did not feel like doing it.  I left IS at bedside with patient and encouraged her to try later.  Patient wanted all the lights turned off and I left the room.

## 2021-03-04 ENCOUNTER — Encounter (HOSPITAL_COMMUNITY): Payer: Self-pay | Admitting: General Surgery

## 2021-03-04 ENCOUNTER — Inpatient Hospital Stay (HOSPITAL_COMMUNITY): Payer: Medicare HMO

## 2021-03-04 DIAGNOSIS — A419 Sepsis, unspecified organism: Principal | ICD-10-CM

## 2021-03-04 LAB — CBC
HCT: 26.5 % — ABNORMAL LOW (ref 36.0–46.0)
Hemoglobin: 8.4 g/dL — ABNORMAL LOW (ref 12.0–15.0)
MCH: 25.3 pg — ABNORMAL LOW (ref 26.0–34.0)
MCHC: 31.7 g/dL (ref 30.0–36.0)
MCV: 79.8 fL — ABNORMAL LOW (ref 80.0–100.0)
Platelets: 325 10*3/uL (ref 150–400)
RBC: 3.32 MIL/uL — ABNORMAL LOW (ref 3.87–5.11)
RDW: 15 % (ref 11.5–15.5)
WBC: 11.7 10*3/uL — ABNORMAL HIGH (ref 4.0–10.5)
nRBC: 0 % (ref 0.0–0.2)

## 2021-03-04 LAB — COMPREHENSIVE METABOLIC PANEL
ALT: 76 U/L — ABNORMAL HIGH (ref 0–44)
AST: 100 U/L — ABNORMAL HIGH (ref 15–41)
Albumin: 2 g/dL — ABNORMAL LOW (ref 3.5–5.0)
Alkaline Phosphatase: 218 U/L — ABNORMAL HIGH (ref 38–126)
Anion gap: 9 (ref 5–15)
BUN: 22 mg/dL (ref 8–23)
CO2: 20 mmol/L — ABNORMAL LOW (ref 22–32)
Calcium: 7.6 mg/dL — ABNORMAL LOW (ref 8.9–10.3)
Chloride: 107 mmol/L (ref 98–111)
Creatinine, Ser: 1.5 mg/dL — ABNORMAL HIGH (ref 0.44–1.00)
GFR, Estimated: 39 mL/min — ABNORMAL LOW (ref >60)
Glucose, Bld: 260 mg/dL — ABNORMAL HIGH (ref 70–99)
Potassium: 3 mmol/L — ABNORMAL LOW (ref 3.5–5.1)
Sodium: 136 mmol/L (ref 135–145)
Total Bilirubin: 0.7 mg/dL (ref 0.3–1.2)
Total Protein: 5.8 g/dL — ABNORMAL LOW (ref 6.5–8.1)

## 2021-03-04 LAB — GLUCOSE, CAPILLARY
Glucose-Capillary: 183 mg/dL — ABNORMAL HIGH (ref 70–99)
Glucose-Capillary: 256 mg/dL — ABNORMAL HIGH (ref 70–99)
Glucose-Capillary: 257 mg/dL — ABNORMAL HIGH (ref 70–99)
Glucose-Capillary: 260 mg/dL — ABNORMAL HIGH (ref 70–99)
Glucose-Capillary: 264 mg/dL — ABNORMAL HIGH (ref 70–99)
Glucose-Capillary: 271 mg/dL — ABNORMAL HIGH (ref 70–99)
Glucose-Capillary: 275 mg/dL — ABNORMAL HIGH (ref 70–99)
Glucose-Capillary: 511 mg/dL (ref 70–99)

## 2021-03-04 LAB — MAGNESIUM: Magnesium: 1.8 mg/dL (ref 1.7–2.4)

## 2021-03-04 LAB — GLUCOSE, RANDOM: Glucose, Bld: 268 mg/dL — ABNORMAL HIGH (ref 70–99)

## 2021-03-04 IMAGING — US US EXTREM LOW VENOUS*R*
1 series · 13 of 24 positions shown · non-contrast
Comparison: None.

CLINICAL DATA: Right lower extremity edema. Recent right fifth toe
amputation.



[Series 1: us venous img lower uni right (dvt) · portal-venous · 13 of 43 slices shown]
[im 1/43]
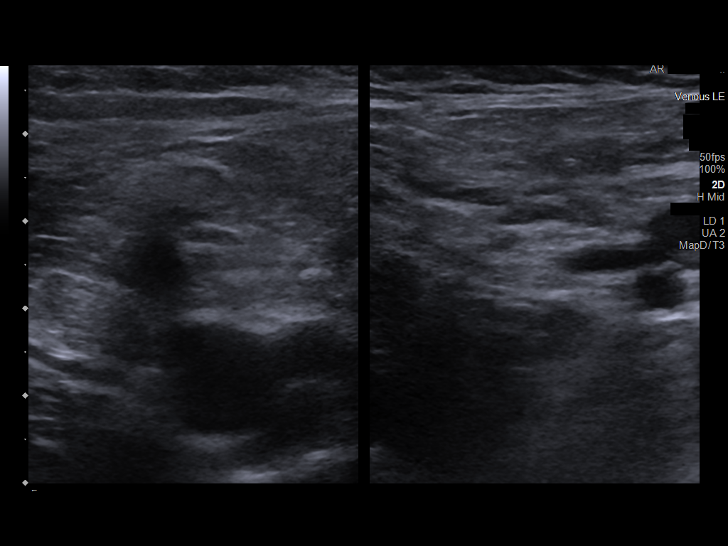
[im 4/43]
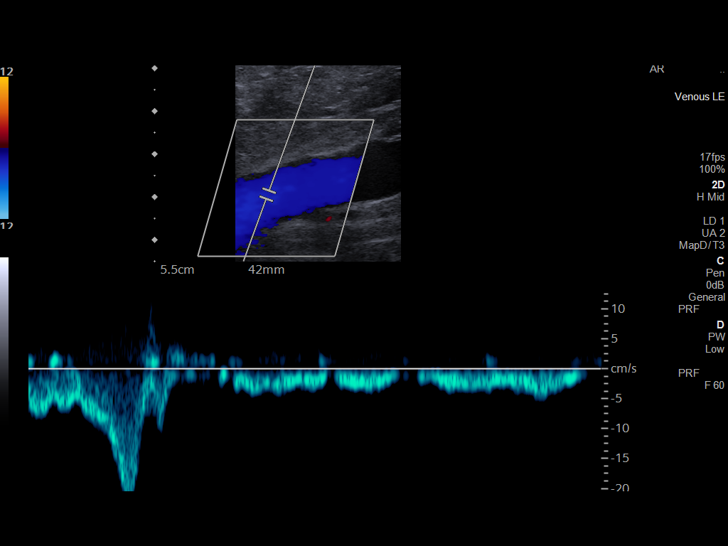
[im 8/43]
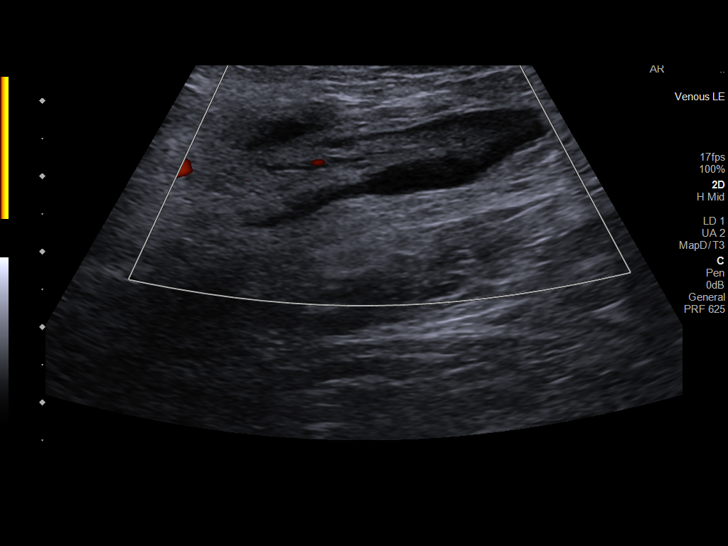
[im 11/43]
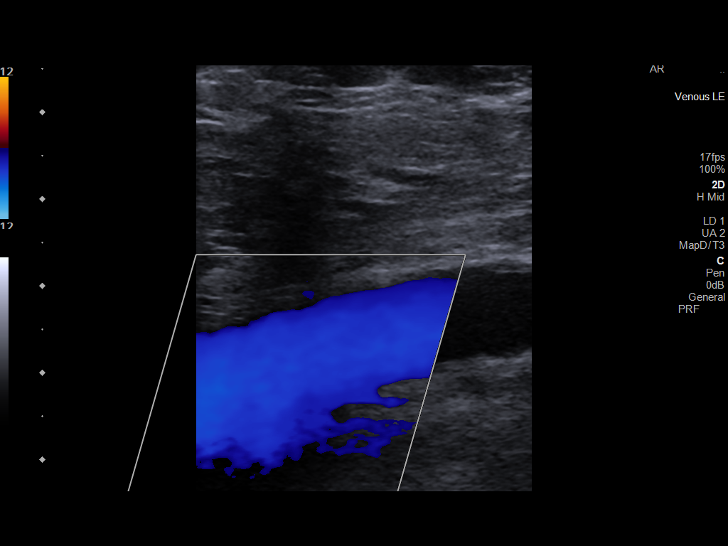
[im 15/43]
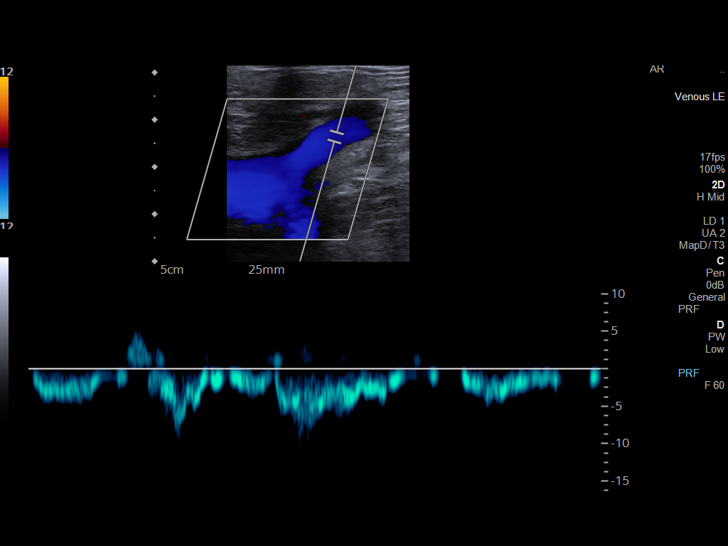
[im 19/43]
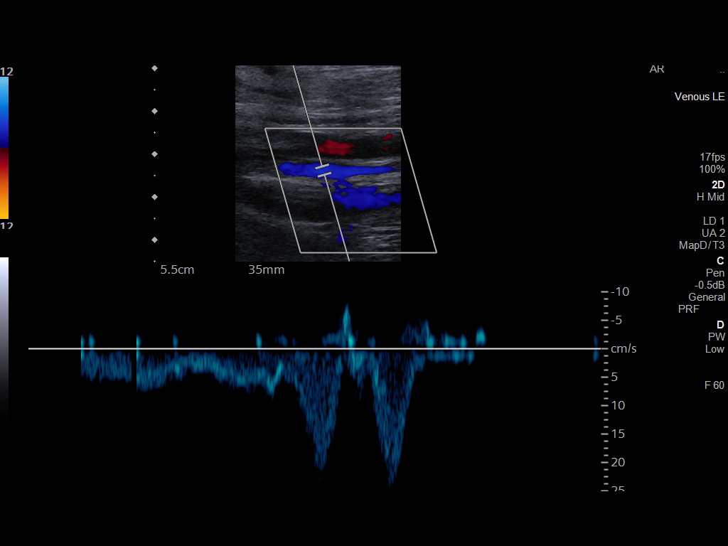
[im 22/43]
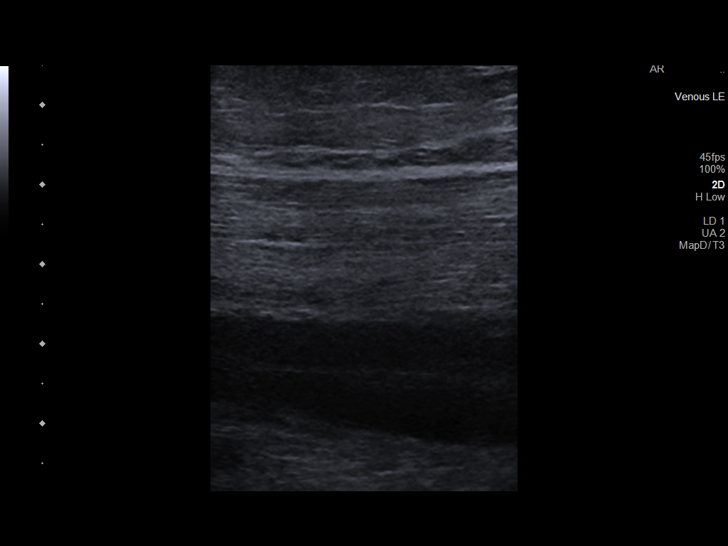
[im 24/43]
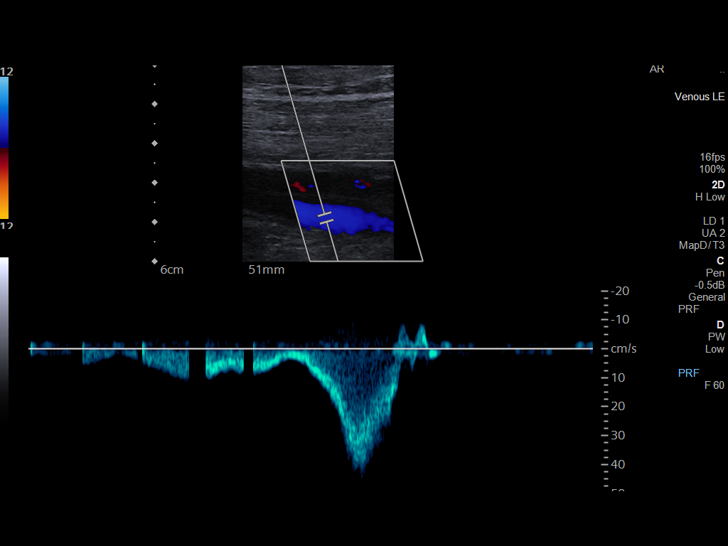
[im 28/43]
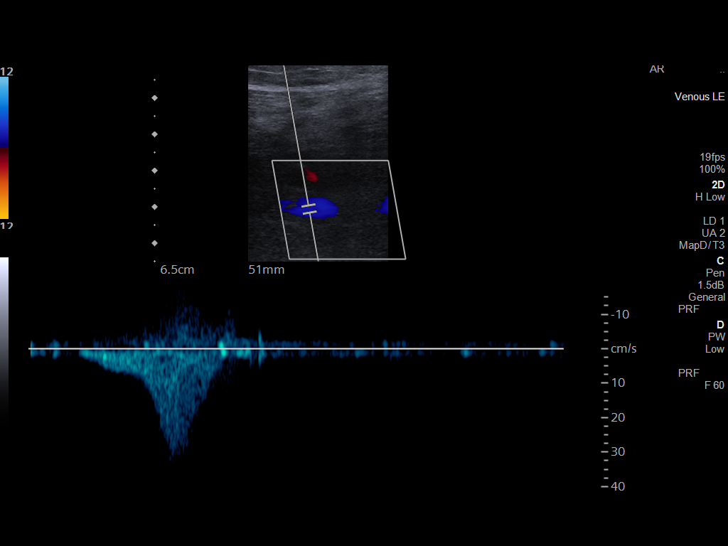
[im 32/43]
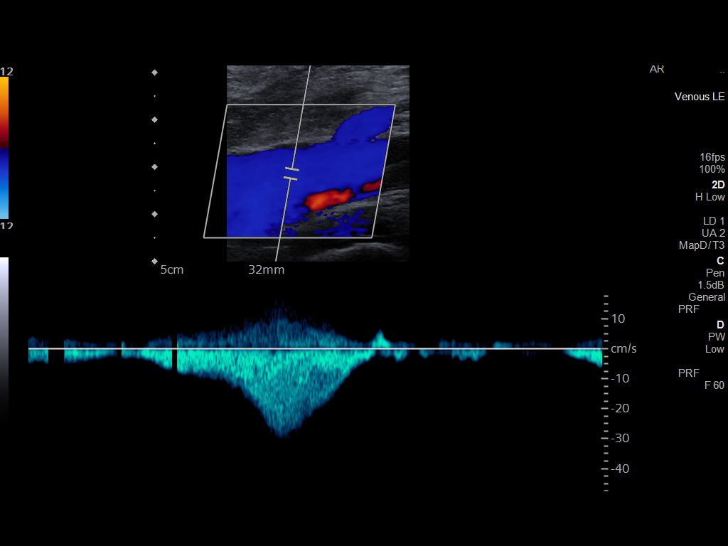
[im 35/43]
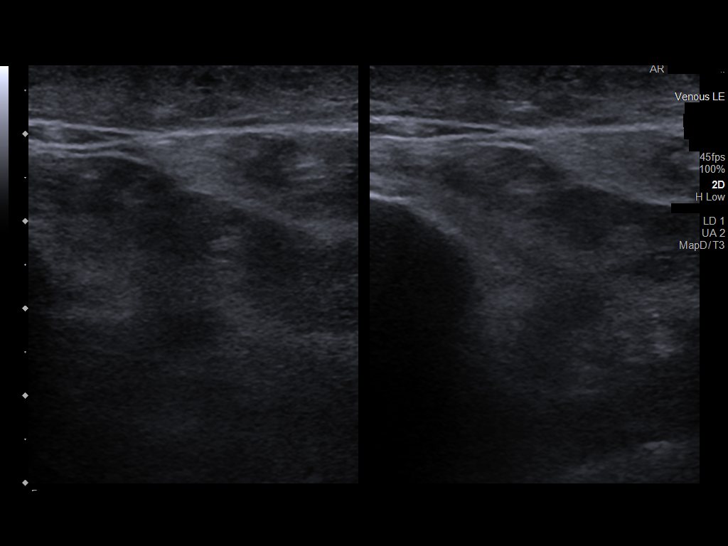
[im 39/43]
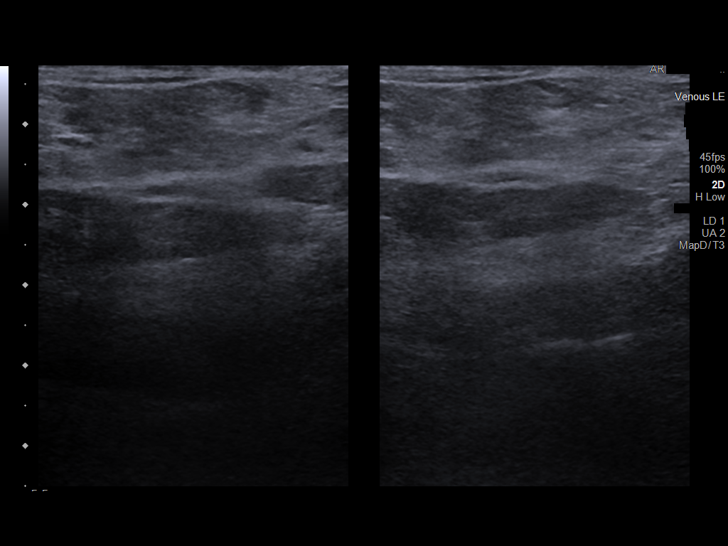
[im 43/43]
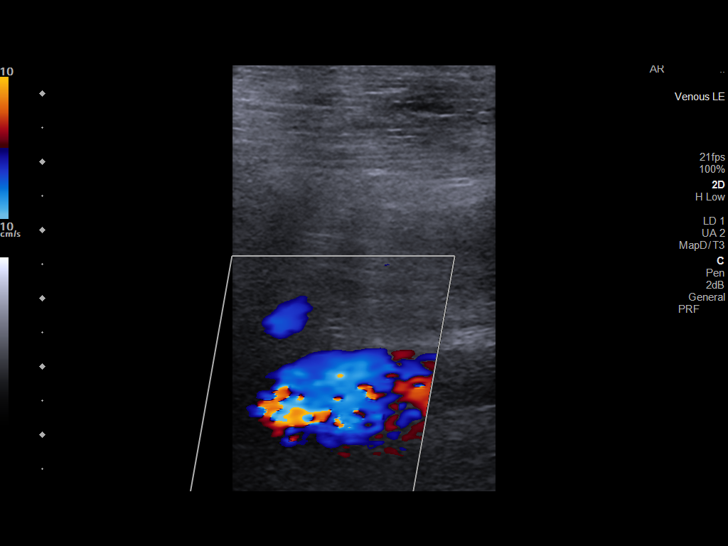

[13 of 24 positions shown; findings below may reference images not displayed]

FINDINGS: Contralateral Common Femoral Vein: Respiratory phasicity is normal
and symmetric with the symptomatic side. No evidence of thrombus.
Normal compressibility.

Common Femoral Vein: No evidence of thrombus. Normal
compressibility, respiratory phasicity and response to augmentation.

Saphenofemoral Junction: No evidence of thrombus. Normal
compressibility and flow on color Doppler imaging.

Profunda Femoral Vein: No evidence of thrombus. Normal
compressibility and flow on color Doppler imaging.

Femoral Vein: No evidence of thrombus. Normal compressibility,
respiratory phasicity and response to augmentation.

Popliteal Vein: No evidence of thrombus. Normal compressibility,
respiratory phasicity and response to augmentation.

Calf Veins: No evidence of thrombus. Normal compressibility and flow
on color Doppler imaging.

Superficial Great Saphenous Vein: No evidence of thrombus. Normal
compressibility.

Venous Reflux:  None.

Other Findings: No evidence of superficial thrombophlebitis or
abnormal fluid collection.
IMPRESSION: No evidence of right lower extremity deep venous thrombosis.

## 2021-03-04 MED ORDER — FUROSEMIDE 10 MG/ML IJ SOLN
20.0000 mg | Freq: Every day | INTRAMUSCULAR | Status: DC
  Administered 2021-03-04 – 2021-03-06 (×3): 20 mg via INTRAVENOUS
  Filled 2021-03-04 (×3): qty 2

## 2021-03-04 MED ORDER — INSULIN ASPART 100 UNIT/ML IJ SOLN
5.0000 [IU] | Freq: Three times a day (TID) | INTRAMUSCULAR | Status: DC
  Administered 2021-03-04 – 2021-03-05 (×4): 5 [IU] via SUBCUTANEOUS

## 2021-03-04 MED ORDER — POTASSIUM CHLORIDE CRYS ER 20 MEQ PO TBCR
40.0000 meq | EXTENDED_RELEASE_TABLET | ORAL | Status: AC
  Administered 2021-03-04 – 2021-03-05 (×3): 40 meq via ORAL
  Filled 2021-03-04 (×3): qty 2

## 2021-03-04 MED ORDER — INSULIN GLARGINE 100 UNIT/ML ~~LOC~~ SOLN
70.0000 [IU] | Freq: Every day | SUBCUTANEOUS | Status: DC
  Administered 2021-03-04 – 2021-03-07 (×4): 70 [IU] via SUBCUTANEOUS
  Filled 2021-03-04 (×5): qty 0.7

## 2021-03-04 NOTE — Progress Notes (Signed)
[facility] Day(s): 4.   Post op day(s): 1 Day Post-Op.   Interval History: Patient seen and examined, no acute events or new complaints overnight. Patient reports dressing change done early today, and was unremarkable.  She appears pleased with her progress, wound care and appearance.    Vital signs in last 24 hours: [min-max] current  Temp:  [99 F (37.2 C)-100.8 F (38.2 C)] 99 F (37.2 C) (07/08 0537) Pulse Rate:  [92-110] 92 (07/08 0537) Resp:  [18-20] 18 (07/08 0537) BP: (98-160)/(51-73) 146/52 (07/08 0537) SpO2:  [95 %-99 %] 98 % (07/08 0537) Weight:  [88.7 kg] 88.7 kg (07/08 0700)     Height: 5\' 8"  (172.7 cm) Weight: 88.7 kg BMI (Calculated): 29.74   Intake/Output last 2 shifts:  07/07 0701 - 07/08 0700 In: 1343.3 [I.V.:1343.3] Out: 5 [Blood:5]   Physical Exam:  Constitutional: alert, cooperative and no distress  Respiratory: breathing non-labored at rest  Cardiovascular: regular rate and sinus rhythm  Gastrointestinal: soft, non-tender, and non-distended Integumentary: wound of right 5th toe amp site, clean and viable.   Labs:  CBC Latest Ref Rng & Units 03/04/2021 03/03/2021 03/02/2021  WBC 4.0 - 10.5 K/uL 11.7(H) 11.7(H) 11.3(H)  Hemoglobin 12.0 - 15.0 g/dL 8.4(L) 9.3(L) 9.7(L)  Hematocrit 36.0 - 46.0 % 26.5(L) 29.0(L) 31.1(L)  Platelets 150 - 400 K/uL 325 327 281   CMP Latest Ref Rng & Units 03/04/2021 03/04/2021 03/03/2021  Glucose 70 - 99 mg/dL 260(H) 268(H) 158(H)  BUN 8 - 23 mg/dL 22 - 23  Creatinine 0.44 - 1.00 mg/dL 1.50(H) - 1.41(H)  Sodium 135 - 145 mmol/L 136 - 136  Potassium 3.5 - 5.1 mmol/L 3.0(L) - 3.2(L)  Chloride 98 - 111 mmol/L 107 - 106  CO2 22 - 32 mmol/L 20(L) - 21(L)  Calcium 8.9 - 10.3 mg/dL 7.6(L) - 8.3(L)  Total Protein 6.5 - 8.1 g/dL 5.8(L) - 6.3(L)  Total Bilirubin 0.3 - 1.2 mg/dL 0.7 - 0.5  Alkaline Phos 38 - 126 U/L 218(H) - 163(H)  AST 15 - 41 U/L 100(H) - 119(H)  ALT 0 - 44 U/L 76(H) -  94(H)     Imaging studies: No new pertinent imaging studies   Assessment/Plan:  60 y.o. 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  -Continue daily dressing changes to ensure wound remains moist.  -Offloading and protection of operative site.  -Elevation of operative site.  All of the above findings and recommendations were discussed with the patient, patient's family, and all of patient's and family's questions were answered to their expressed satisfaction.  -- Ronny Bacon, M.D., [facility] 03/04/2021

## 2021-03-04 NOTE — Progress Notes (Addendum)
Patient Demographics:    Nicole Golden, is a 60 y.o. female, DOB - 10-12-1960, YBW:[id number]  Admit date - 02/28/2021   Admitting Physician [name], MD  Outpatient Primary MD for the patient is [name], DO  LOS - 4   No chief complaint on file.       Subjective:    Nicole Golden she is feeling better today.  No nausea or vomiting, tolerating diet.  She reports she is having bowel movements.  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 ---Rt   Gallstones   Gangrene of toe of right foot ([place])  Brief Summary:- 60 year old with past medical history relevant for uncontrolled DM with peripheral neuropathy, HTN, HLD, CKD 3B, and chronic right small toe wound admitted on 02/28/2021 with sepsis secondary to right foot cellulitis after presenting  to the ED with malaise, fatigue, fevers, nausea and vomiting for the last 4 days - A/p 1)severe Sepsis Secondary to Right Foot Cellulitis/Acute Osteomyelitis --POA--patient met sepsis criteria on admission with WBC of 11.9, fever of 102.7, tachycardia with heart rate of 110 tachypnea with respiratory rate of 22- she also had organ dysfunction with AKI -Plain films of right foot shows possible osteomyelitis of fifth digit concern for necrotizing infection --IV vancomycin per cellulitis order set -Since intraoperative wound culture shows gram-positive cocci and gram-negative rods, cefepime was also added -Seen by general surgery and underwent amputation of right fifth digit on 7/7 -Patient with significant right foot peripheral neuropathy in the  setting of uncontrolled DM--- despite probing the wound and obtaining wound cultures patient had No foot pain  2)Cholelithiasis without cholecystitis--- LFTs noted -CT abdomen and pelvis and MRCP noted, no biliary dilatation or choledocholithiasis -Discussed with general surgeon, no need for inpatient intervention at this time -Patient may benefit from outpatient consultation for possible elective lap chole  Hepatic Function Latest Ref Rng & Units 03/04/2021 03/03/2021 03/02/2021  Total Protein 6.5 - 8.1 g/dL 5.8(L) 6.3(L) 6.4(L)  Albumin 3.5 - 5.0 g/dL 2.0(L) 2.3(L) 2.3(L)  AST 15 - 41 U/L 100(H) 119(H) 138(H)  ALT 0 - 44 U/L 76(H) 94(H) 86(H)  Alk Phosphatase 38 - 126 U/L 218(H) 163(H) 139(H)  Total Bilirubin 0.3 - 1.2 mg/dL 0.7 0.5 0.8  Bilirubin, Direct 0.0 - 0.2 mg/dL - - -   3)Acute pancreatitis with nausea and vomiting and abdominal pain---- -=patient with H/o  idiopathic pancreatitis, -Lipase is up to73, but has since normalized  no EtOH use  - Has nausea and vomiting----secondary to #1 and #2 above -Improved with IV fluids and IV antiemetics -She is tolerating heart healthy diet -Abdominal imaging studies as noted above in #2 without acute findings -Will need to follow-up with GI and general surgery as an outpatient -LFTs currently elevated, possibly reactive -No biliary obstruction noted on imaging and overall nausea, vomiting abdominal pain improved -Hold statin -Overall LFTs are trending down  4)Aki on CKD stage - 3B -Creatinine noted to be elevated  on admission, up to 1.8, creatinine was 1.45 on 02/07/2021 --- renally adjust medications, avoid nephrotoxic agents / dehydration  / hypotension -She has been hydrated with IV fluids and creatinine has returned to baseline  5)HTN--BPs not at goal, increase amlodipine to 10 mg daily, -- hold losartan and HCTZ due to sepsis and AKI -May use IV labetalol as needed elevated BP  6)DM2--A1c previously greater than 12 reflecting  uncontrolled DM with hyperglycemia Use Novolog/Humalog Sliding scale insulin with Accu-Cheks/Fingersticks as ordered  -Blood sugars are elevated today -We will increase Lantus dosing and add meal coverage NovoLog  7) chronic anemia most likely secondary to CKD--- hemoglobin is down to 9.7 from a baseline usually above 11 -- Patient also had some bleeding postoperatively which seems to have resolved now -Anemia work-up/labs including stool occult blood ordered and pending -May benefit from ESA/EPO agent intermittently -Continue to follow  8) lower extremity edema -Noted to have right lower extremity edema, possibly related to recent infection -Check venous Dopplers -We will resume home dose of Lasix  Disposition/Need for in-Hospital Stay- patient unable to be discharged at this time due to sepsis secondary to right foot cellulitis and abscess in a diabetic patient requiring IV antibiotics and IVF  Status is: Inpatient  Remains inpatient appropriate because: See disposition above  Disposition: The patient is from: Home              Anticipated d/c is to: Home              Anticipated d/c date is: 3 days              Patient currently is not medically stable to d/c. Barriers: Not Clinically Stable-   Code Status : -  Code Status: Full Code   Family Communication:   (patient is alert, awake and coherent)  Consults  :  Gen surgery  DVT Prophylaxis  :   - SCDs  enoxaparin (LOVENOX) injection 40 mg Start: 03/04/21 0800 SCD's Start: 03/03/21 1944 Place and maintain sequential compression device Start: 02/28/21 1850    Lab Results  Component Value Date   PLT 325 03/04/2021    Inpatient Medications  Scheduled Meds:  amLODipine  10 mg Oral Daily   aspirin EC  81 mg Oral Q breakfast   enoxaparin (LOVENOX) injection  40 mg Subcutaneous Q24H   furosemide  20 mg Intravenous Daily   insulin aspart  0-9 Units Subcutaneous Q4H   insulin glargine  60 Units Subcutaneous QHS    metoCLOPramide  5 mg Oral TID AC   potassium chloride  40 mEq Oral Q4H   Continuous Infusions:  ceFEPime (MAXIPIME) IV 2 g (03/04/21 1125)   vancomycin 1,250 mg (03/03/21 2000)   PRN Meds:.acetaminophen, hydrALAZINE, HYDROcodone-acetaminophen, labetalol, ondansetron **OR** ondansetron (ZOFRAN) IV    Anti-infectives (From admission, onward)    Start     Dose/Rate Route Frequency Ordered Stop   03/04/21 0800  ceFEPIme (MAXIPIME) 2 g in sodium chloride 0.9 % 100 mL IVPB        2 g 200 mL/hr over 30 Minutes Intravenous Every 12 hours 03/03/21 1840     03/03/21 1930  ceFEPIme (MAXIPIME) 2 g in sodium chloride 0.9 % 100 mL IVPB        2 g 200 mL/hr over 30 Minutes Intravenous  Once 03/03/21 1833 03/03/21 1952   03/02/21 1800  vancomycin (VANCOCIN) IVPB 1000 mg/200 mL premix  Status:  Discontinued        1,000 mg  200 mL/hr over 60 Minutes Intravenous Every 24 hours 03/01/21 1630 03/02/21 0852   03/02/21 1800  vancomycin (VANCOREADY) IVPB 1250 mg/250 mL        1,250 mg 166.7 mL/hr over 90 Minutes Intravenous Every 24 hours 03/02/21 0852     03/01/21 1800  metroNIDAZOLE (FLAGYL) IVPB 500 mg  Status:  Discontinued        500 mg 100 mL/hr over 60 Minutes Intravenous Every 8 hours 03/01/21 1647 03/03/21 1833   03/01/21 1730  vancomycin (VANCOREADY) IVPB 1500 mg/300 mL        1,500 mg 150 mL/hr over 120 Minutes Intravenous  Once 03/01/21 1624 03/01/21 1856   03/01/21 1715  vancomycin (VANCOCIN) IVPB 1000 mg/200 mL premix  Status:  Discontinued        1,000 mg 200 mL/hr over 60 Minutes Intravenous  Once 03/01/21 1622 03/01/21 1623         Objective:   Vitals:   03/04/21 0536 03/04/21 0537 03/04/21 0700 03/04/21 1305  BP: 140/62 (!) 146/52  (!) 135/55  Pulse: 93 92  91  Resp:  18  18  Temp: 99 F (37.2 C) 99 F (37.2 C)  98.1 F (36.7 C)  TempSrc: Oral   Oral  SpO2: 99% 98%  98%  Weight:   88.7 kg   Height:        Wt Readings from Last 3 Encounters:  03/04/21 88.7 kg   02/12/21 86.2 kg  02/07/21 86.2 kg     Intake/Output Summary (Last 24 hours) at 03/04/2021 1518 Last data filed at 03/04/2021 0300 Gross per 24 hour  Intake 843.33 ml  Output --  Net 843.33 ml     Physical Exam  General exam: Alert, awake, oriented x 3 Respiratory system: Clear to auscultation. Respiratory effort normal. Cardiovascular system:RRR. No murmurs, rubs, gallops. Gastrointestinal system: Abdomen is nondistended, soft and nontender. No organomegaly or masses felt. Normal bowel sounds heard. Central nervous system: Alert and oriented. No focal neurological deficits. Extremities: right foot with dressing, right lower leg is edematous Skin: No rashes, lesions or ulcers Psychiatry: Judgement and insight appear normal. Mood & affect appropriate.      Data Review:   Micro Results Recent Results (from the past 240 hour(s))  SARS CORONAVIRUS 2 (TAT 6-24 HRS) Nasopharyngeal Nasopharyngeal Swab     Status: None   Collection Time: 02/28/21  8:58 PM   Specimen: Nasopharyngeal Swab  Result Value Ref Range Status   SARS Coronavirus 2 NEGATIVE NEGATIVE Final    Comment: (NOTE) SARS-CoV-2 target nucleic acids are NOT DETECTED.  The SARS-CoV-2 RNA is generally detectable in upper and lower respiratory specimens during the acute phase of infection. Negative results do not preclude SARS-CoV-2 infection, do not rule out co-infections with other pathogens, and should not be used as the sole basis for treatment or other patient management decisions. Negative results must be combined with clinical observations, patient history, and epidemiological information. The expected result is Negative.  Fact Sheet for Patients: SugarRoll.be  Fact Sheet for Healthcare Providers: https://www.woods-mathews.com/  This test is not yet approved or cleared by the [place] FDA and  has been authorized for detection and/or diagnosis of SARS-CoV-2  by FDA under an Emergency Use Authorization (EUA). This EUA will remain  in effect (meaning this test can be used) for the duration of the COVID-19 declaration under Se ction 564(b)(1) of the Act, 21 U.S.C. section 360bbb-3(b)(1), unless the authorization is terminated or revoked sooner.  Performed at [facility], [place] [address] [place], [place] [address]   Aerobic/Anaerobic Culture w Gram Stain (surgical/deep wound)     Status: None (Preliminary result)   Collection Time: 03/01/21  4:20 PM   Specimen: Foot; Abscess  Result Value Ref Range Status   Specimen Description   Final    FOOT Performed at [facility], [address], [place], [place] [address]    Special Requests   Final    NONE Performed at [facility], [address], [place], [place] [address]    Gram Stain   Final    RARE WBC PRESENT, PREDOMINANTLY PMN ABUNDANT GRAM POSITIVE COCCI Performed at [facility], [place] [address], [place], [place] [address]    Culture   Final    ABUNDANT ENTEROBACTER CLOACAE NO ANAEROBES ISOLATED; CULTURE IN PROGRESS FOR 5 DAYS    Report Status PENDING  Incomplete   Organism ID, Bacteria ENTEROBACTER CLOACAE  Final      Susceptibility   Enterobacter cloacae - MIC*    CEFAZOLIN >=64 RESISTANT Resistant     CEFEPIME <=0.12 SENSITIVE Sensitive     CEFTAZIDIME <=1 SENSITIVE Sensitive     CIPROFLOXACIN <=0.25 SENSITIVE Sensitive     GENTAMICIN <=1 SENSITIVE Sensitive     IMIPENEM <=0.25 SENSITIVE Sensitive     TRIMETH/SULFA <=20 SENSITIVE Sensitive     PIP/TAZO <=4 SENSITIVE Sensitive     * ABUNDANT ENTEROBACTER CLOACAE  Culture, blood (Routine X 2) w Reflex to ID Panel     Status: None (Preliminary result)   Collection Time: 03/01/21  5:57 PM   Specimen: BLOOD RIGHT ARM  Result Value Ref Range Status   Specimen Description BLOOD RIGHT ARM  Final   Special Requests   Final    BOTTLES DRAWN AEROBIC AND ANAEROBIC Blood Culture adequate volume   Culture   Final    NO  GROWTH 3 DAYS Performed at [facility], [address] [place], [place] [address]    Report Status PENDING  Incomplete  Culture, blood (Routine X 2) w Reflex to ID Panel     Status: None (Preliminary result)   Collection Time: 03/01/21  5:57 PM   Specimen: BLOOD LEFT HAND  Result Value Ref Range Status   Specimen Description BLOOD LEFT HAND  Final   Special Requests   Final    BOTTLES DRAWN AEROBIC AND ANAEROBIC Blood Culture adequate volume   Culture   Final    NO GROWTH 3 DAYS Performed at [facility], [address] [place], [place] [address]    Report Status PENDING  Incomplete  Surgical pcr screen     Status: None   Collection Time: 03/03/21  5:03 AM   Specimen: Nasal Mucosa; Nasal Swab  Result Value Ref Range Status   MRSA, PCR NEGATIVE NEGATIVE Final   Staphylococcus aureus NEGATIVE NEGATIVE Final    Comment: (NOTE) The Xpert SA Assay (FDA approved for NASAL specimens in patients 87 years of age and older), is one component of a comprehensive surveillance program. It is not intended to diagnose infection nor to guide or monitor treatment. Performed at [facility], [address], [place], [place] [address]   Aerobic/Anaerobic Culture w Gram Stain (surgical/deep wound)     Status: None (Preliminary result)   Collection Time: 03/03/21  8:50 AM   Specimen: PATH Other; Tissue  Result Value Ref Range Status   Specimen Description   Final    WOUND Performed at [facility], [address]  [place], [place] [address]    Special Requests   Final    NONE Performed at [facility], [address], [place], [place] [address]    Gram Stain   Final    RARE WBC PRESENT,BOTH PMN AND MONONUCLEAR ABUNDANT GRAM POSITIVE COCCI FEW GRAM VARIABLE ROD    Culture   Final    MODERATE GRAM NEGATIVE RODS IDENTIFICATION AND SUSCEPTIBILITIES TO FOLLOW Performed at [facility], [place] [address], [place], [place] [address]    Report Status PENDING  Incomplete    Radiology  Reports CT ABDOMEN PELVIS WO CONTRAST  Result Date: 02/28/2021 CLINICAL DATA:  Nonlocalized acute abdominal pain. EXAM: CT ABDOMEN AND PELVIS WITHOUT CONTRAST TECHNIQUE: Multidetector CT imaging of the abdomen and pelvis was performed following the standard protocol without IV contrast. COMPARISON:  Ultrasound pelvis 02/04/2018. FINDINGS: Lower chest: No acute abnormality. Hepatobiliary: No focal liver abnormality. Tiny calcified stones noted within the gallbladder lumen. No gallbladder wall thickening or pericholecystic fluid. No biliary dilatation. Pancreas: No focal lesion. Slight hazy pancreatic contour likely due to motion artifact. Otherwise normal pancreatic contour. No surrounding inflammatory changes. No main pancreatic ductal dilatation. Spleen: Normal in size without focal abnormality. A splenule is noted. Adrenals/Urinary Tract: No adrenal nodule bilaterally. Bilateral kidneys enhance symmetrically. No hydronephrosis. No hydroureter. The urinary bladder is unremarkable. Stomach/Bowel: Stomach is within normal limits. No evidence of bowel wall thickening or dilatation. Appendix appears normal. Vascular/Lymphatic: No abdominal aorta or iliac aneurysm. At least moderate atherosclerotic plaque of the aorta and its branches. No abdominal, pelvic, or inguinal lymphadenopathy. Reproductive: Redemonstration of a coarsely calcified left subserosal fibroid measuring up to at least 5.5 cm. A central coarsely calcified or in lesion is also noted likely representing known submucosal uterine fibroid. Bilateral adnexa are otherwise unremarkable. Other: No intraperitoneal free fluid. No intraperitoneal free gas. No organized fluid collection. Musculoskeletal: No abdominal wall hernia or abnormality. No suspicious lytic or blastic osseous lesions. No acute displaced fracture. IMPRESSION: 1. Slight hazy pancreatic contour likely due to motion artifact. Differential diagnosis includes acute pancreatitis. Recommend  correlation with lipase levels. 2. Cholelithiasis no CT findings of acute cholecystitis. 3. Subserosal and submucosal degenerative uterine fibroids. 4. Please note limited evaluation on this noncontrast study. Electronically Signed   By: Iven Finn M.D.   On: 02/28/2021 17:06   MR ABDOMEN MRCP WO CONTRAST  Result Date: 03/01/2021 CLINICAL DATA:  Abdominal pain.  Cholelithiasis. EXAM: MRI ABDOMEN WITHOUT CONTRAST  (INCLUDING MRCP) TECHNIQUE: Multiplanar multisequence MR imaging of the abdomen was performed. Heavily T2-weighted images of the biliary and pancreatic ducts were obtained, and three-dimensional MRCP images were rendered by post processing. COMPARISON:  CT abdomen 02/28/2021 FINDINGS: Despite efforts by the technologist and patient, motion artifact is present on today's exam and could not be eliminated. This reduces exam sensitivity and specificity. Lower chest: Unremarkable Hepatobiliary: The small dependent gallstone in the gallbladder was better seen at CT. No biliary dilatation. No discrete filling defect in the common bile duct which measures 0.5 cm in diameter and which demonstrates normal conical tapering. No significant focal liver lesion identified. Pancreas:  Unremarkable Spleen:  Unremarkable Adrenals/Urinary Tract:  Both adrenal glands appear normal. 0.9 cm fluid density lesion of the left mid kidney anteriorly on image 21 series 4, probably a cyst. Similar 0.7 cm lesion of the right kidney upper pole on image 25 series 5. Stomach/Bowel: Unremarkable Vascular/Lymphatic:  Aortoiliac atherosclerotic vascular disease. Other: Uterine fibroids are observed on the coronal T2 weighted images. Musculoskeletal: Unremarkable IMPRESSION:  1. No biliary dilatation or appreciable choledocholithiasis on today's examination. The patient has known cholelithiasis based on recent CT although previously seen gallstone is less conspicuous on today's MRI. 2. Small fluid density renal lesions favoring cysts.  3.  Aortic Atherosclerosis (ICD10-I70.0). 4. Uterine fibroids. Electronically Signed   By: Van Clines M.D.   On: 03/01/2021 10:47   MR 3D Recon At Scanner  Result Date: 03/02/2021 : Please see accession [id number] [facility] for MRCP report. Electronically Signed   By: Van Clines M.D.   On: 03/02/2021 08:06   US ARTERIAL SEG MULTIPLE LE (ABI, SEGMENTAL PRESSURES, PVR'S)  Result Date: 03/02/2021 CLINICAL DATA:  Right toe gangrene EXAM: NONINVASIVE PHYSIOLOGIC VASCULAR STUDY OF BILATERAL LOWER EXTREMITIES TECHNIQUE: Non-invasive vascular evaluation of both lower extremities was performed at rest, including calculation of ankle-brachial indices, multiple segmental pressure evaluation, segmental Doppler and segmental pulse volume recording. COMPARISON:  None. FINDINGS: Right Lower Extremity Resting ABI:  0.73 Segmental Pressures: Significant pressure gradient between the upper and lower thigh cuff consistent with SFA disease. Additionally, pressure gradients are present between the calf and ankle cuff consistent with below the knee disease. Arterial Waveforms: Transition to abnormal monophasic arterial waveform in the popliteal artery consistent with SFA occlusive disease. PVRs: Markedly blunted PVRs in the digits likely reflects severe small vessel disease. Left Lower Extremity: Resting ABI: 0.91 Segmental Pressures: Pressure gradient between the lower thigh and calf cuffs consistent with distal femoropopliteal disease. Arterial Waveforms: Irregular arterial waveforms likely affected by artifact. PVRs: Digital waveforms are relatively preserved. Other: Symmetric upper extremity pressures. Ankle Brachial index > 1.4 Non diagnostic secondary to incompressible vessel calcifications 1.0-1.4       Normal 0.9-0.99     Borderline PAD 0.8-0.89     Mild PAD 0.5-0.79     Moderate PAD < 0.5          Severe PAD Toe Brachial Index Normal     >0.65 Moderate  0.53-0.64 Severe     <0.23 Toe Pressures Absolute toe  pressure >40mHg sufficient for wound healing. Toe pressures <58mg = critical limb ischemia. IMPRESSION: 1. Abnormal resting right ankle-brachial index of 0.73 consistent with at least moderate peripheral arterial disease. 2. Segmental evaluation suggests outflow (superficial femoral) occlusive disease as well as runoff (below the knee) disease. Additionally, there is significant diminution of the arterial waveforms in the digits consistent with small vessel disease. 3. Resting left ankle-brachial index of 0.91 consistent with borderline peripheral arterial disease. 4. Segmental evaluation suggests an element of stenosis in the distal femoropopliteal segment on the left. Signed, HeCriselda PeachesMD, RPNew Havenascular and Interventional Radiology Specialists [facility] Electronically Signed   By: HeJacqulynn Cadet.D.   On: 03/02/2021 12:08   DG Abd Acute W/Chest  Result Date: 02/28/2021 CLINICAL DATA:  Vomiting. EXAM: DG ABDOMEN ACUTE WITH 1 VIEW CHEST COMPARISON:  None. FINDINGS: There is no evidence of dilated bowel loops or free intraperitoneal air. Calcified uterine fibroid is noted in the pelvis. Heart size and mediastinal contours are within normal limits. Both lungs are clear. IMPRESSION: No abnormal bowel dilatation.  No acute cardiopulmonary disease. Electronically Signed   By: JaMarijo Conception.D.   On: 02/28/2021 13:39   DG Foot Complete Right  Result Date: 03/01/2021 CLINICAL DATA:  Swelling with yellow fluid EXAM: RIGHT FOOT COMPLETE - 3+ VIEW COMPARISON:  None. FINDINGS: No fracture or malalignment. Moderate gas within the dorsal soft tissues of the foot extending to the anterior ankle region concerning for gas forming/necrotizing  infection. Possible ulceration of the fifth digit at the level of the IP joint. Possible small erosion at the head of the fifth proximal phalanx. IMPRESSION: 1. Soft tissue swelling. Gas within the soft tissues of the dorsum of the foot and anterior to the  ankle raises concern for gas forming/necrotizing infection 2. Possible ulceration of the fifth digit. Suspect small cortical erosion at the head of the fifth proximal phalanx raising concern for osteomyelitis These results will be called to the ordering clinician or representative by the Radiologist Assistant, and communication documented in the PACS or [employer]. Electronically Signed   By: Donavan Foil M.D.   On: 03/01/2021 17:00     CBC Recent Labs  Lab 02/28/21 1305 03/01/21 0624 03/02/21 0443 03/03/21 0538 03/04/21 0538  WBC 11.9* 10.1 11.3* 11.7* 11.7*  HGB 10.9* 9.3* 9.7* 9.3* 8.4*  HCT 34.6* 29.3* 31.1* 29.0* 26.5*  PLT 242 226 281 327 325  MCV 81.4 80.5 80.6 80.3 79.8*  MCH 25.6* 25.5* 25.1* 25.8* 25.3*  MCHC 31.5 31.7 31.2 32.1 31.7  RDW 13.9 14.0 14.1 14.6 15.0  LYMPHSABS 1.1 1.2  --   --   --   MONOABS 0.9 0.7  --   --   --   EOSABS 0.0 0.1  --   --   --   BASOSABS 0.0 0.0  --   --   --     Chemistries  Recent Labs  Lab 02/28/21 1305 02/28/21 1917 03/01/21 0624 03/02/21 0443 03/03/21 0538 03/04/21 0108 03/04/21 0538  NA 133* 134* 136 135 136  --  136  K 4.0 3.7 3.5 3.2* 3.2*  --  3.0*  CL 101 104 105 104 106  --  107  CO2 19* 20* 21* 22 21*  --  20*  GLUCOSE 441* 400* 253* 207* 158* 268* 260*  BUN 40* 38* 38* 28* 23  --  22  CREATININE 1.82* 1.66* 1.77* 1.36* 1.41*  --  1.50*  CALCIUM 8.8* 8.5* 8.3* 8.4* 8.3*  --  7.6*  MG  --   --   --   --   --   --  1.8  AST 39  --  59* 138* 119*  --  100*  ALT 38  --  42 86* 94*  --  76*  ALKPHOS 88  --  81 139* 163*  --  218*  BILITOT 1.0  --  0.7 0.8 0.5  --  0.7   ------------------------------------------------------------------------------------------------------------------ No results for input(s): CHOL, HDL, LDLCALC, TRIG, CHOLHDL, LDLDIRECT in the last 72 hours.  Lab Results  Component Value Date   HGBA1C 13.1 (H) 02/28/2021    ------------------------------------------------------------------------------------------------------------------ No results for input(s): TSH, T4TOTAL, T3FREE, THYROIDAB in the last 72 hours.  Invalid input(s): FREET3 ------------------------------------------------------------------------------------------------------------------ Recent Labs    03/02/21 0443  VITAMINB12 1,334*  FOLATE 9.8  FERRITIN 969*  TIBC 164*  IRON 14*    Coagulation profile No results for input(s): INR, PROTIME in the last 168 hours.  No results for input(s): DDIMER in the last 72 hours.  Cardiac Enzymes No results for input(s): CKMB, TROPONINI, MYOGLOBIN in the last 168 hours.  Invalid input(s): CK ------------------------------------------------------------------------------------------------------------------ No results found for: BNP   Kathie Dike M.D on 03/04/2021 at 3:18 PM  Go to [email or web address] - for contact info  [facility] - Office  [phone]

## 2021-03-04 NOTE — Progress Notes (Signed)
Spoke to patient on the phone about her diabetes. Was diagnosed with diabetes in 1984. Has been on insulin all that time. Recently saw her PCP and was started on Jardiance along with her insulin. States that her HgbA1C I has been 11% recently. She takes Lantus 66 units BID, Novolog sliding scale up to 12 units TID with meals according to her blood sugar. Was not able to start the Jardiance since she got sick on Wednesday. Stated that she had the COVID booster last week and became very sick from the [place] vaccine. She had not had a reaction like that with the other Moderna vaccines.   States that she lives alone, but her daughter in law cooks for her frequently. She gives her very healthy meals including meat, vegetable, and starch. States that she does have low blood sugars at home, although she has not checked the results with blood glucose meter when this happens. She does have symptoms. Encouraged her to always check her blood sugar when she has symptoms, treat, and then check again to make sure it is coming up. Encouraged her to keep a record of blood sugars to take with her to the doctor.   Will continue to monitor blood sugars while in the hospital.  Harvel Ricks RN BSN CDE Diabetes Coordinator Pager: [phone]  8am-5pm

## 2021-03-04 NOTE — Progress Notes (Signed)
[facility] night shift telemetry coverage note.  The nursing staff reported that the patient had a CBG value of 511 mg/dL.  However, this result did not correlate with her earlier measurements.  Add stat glucose was sent to the lab and was 268 mg/dL.  She had dinner around 2030.  She received 60 units of Lantus about 2 hours ago.  She is on CBG monitoring with RI SS coverage every 4 hours.  Tennis Must, MD.

## 2021-03-04 NOTE — Care Management Important Message (Signed)
Important Message  Patient Details  Name: Nicole Golden MRN: [id number] Date of Birth: 11/11/1960   Medicare Important Message Given:  Yes     Tommy Medal 03/04/2021, 3:50 PM

## 2021-03-05 LAB — COMPREHENSIVE METABOLIC PANEL
ALT: 77 U/L — ABNORMAL HIGH (ref 0–44)
AST: 95 U/L — ABNORMAL HIGH (ref 15–41)
Albumin: 2.3 g/dL — ABNORMAL LOW (ref 3.5–5.0)
Alkaline Phosphatase: 291 U/L — ABNORMAL HIGH (ref 38–126)
Anion gap: 9 (ref 5–15)
BUN: 19 mg/dL (ref 8–23)
CO2: 22 mmol/L (ref 22–32)
Calcium: 8.2 mg/dL — ABNORMAL LOW (ref 8.9–10.3)
Chloride: 107 mmol/L (ref 98–111)
Creatinine, Ser: 1.42 mg/dL — ABNORMAL HIGH (ref 0.44–1.00)
GFR, Estimated: 42 mL/min — ABNORMAL LOW (ref >60)
Glucose, Bld: 145 mg/dL — ABNORMAL HIGH (ref 70–99)
Potassium: 3.4 mmol/L — ABNORMAL LOW (ref 3.5–5.1)
Sodium: 138 mmol/L (ref 135–145)
Total Bilirubin: 0.6 mg/dL (ref 0.3–1.2)
Total Protein: 6.7 g/dL (ref 6.5–8.1)

## 2021-03-05 LAB — GLUCOSE, CAPILLARY
Glucose-Capillary: 118 mg/dL — ABNORMAL HIGH (ref 70–99)
Glucose-Capillary: 160 mg/dL — ABNORMAL HIGH (ref 70–99)
Glucose-Capillary: 226 mg/dL — ABNORMAL HIGH (ref 70–99)
Glucose-Capillary: 261 mg/dL — ABNORMAL HIGH (ref 70–99)
Glucose-Capillary: 354 mg/dL — ABNORMAL HIGH (ref 70–99)
Glucose-Capillary: 367 mg/dL — ABNORMAL HIGH (ref 70–99)

## 2021-03-05 LAB — CBC
HCT: 29.5 % — ABNORMAL LOW (ref 36.0–46.0)
Hemoglobin: 9.3 g/dL — ABNORMAL LOW (ref 12.0–15.0)
MCH: 25.5 pg — ABNORMAL LOW (ref 26.0–34.0)
MCHC: 31.5 g/dL (ref 30.0–36.0)
MCV: 81 fL (ref 80.0–100.0)
Platelets: 408 10*3/uL — ABNORMAL HIGH (ref 150–400)
RBC: 3.64 MIL/uL — ABNORMAL LOW (ref 3.87–5.11)
RDW: 15.5 % (ref 11.5–15.5)
WBC: 12.3 10*3/uL — ABNORMAL HIGH (ref 4.0–10.5)
nRBC: 0 % (ref 0.0–0.2)

## 2021-03-05 LAB — LIPASE, BLOOD: Lipase: 32 U/L (ref 11–51)

## 2021-03-05 MED ORDER — INSULIN ASPART 100 UNIT/ML IJ SOLN
10.0000 [IU] | Freq: Three times a day (TID) | INTRAMUSCULAR | Status: DC
  Administered 2021-03-06 – 2021-03-08 (×6): 10 [IU] via SUBCUTANEOUS

## 2021-03-05 MED ORDER — POTASSIUM CHLORIDE CRYS ER 20 MEQ PO TBCR
40.0000 meq | EXTENDED_RELEASE_TABLET | ORAL | Status: AC
  Administered 2021-03-05 – 2021-03-06 (×3): 40 meq via ORAL
  Filled 2021-03-05: qty 4
  Filled 2021-03-05 (×2): qty 2

## 2021-03-05 NOTE — Progress Notes (Signed)
Patient has had 2 episodes of vomiting and feels nauseated. Administered PRN med as ordered. Continue to monitor.

## 2021-03-05 NOTE — Progress Notes (Signed)
[place] [facility] Day(s): 5.   Post op day(s): 2 Days Post-Op.   Interval History: Patient seen and examined, no acute events or new complaints overnight. Had anxiety attack yesterday with associated n/v following a meal.  Patient reports dressing change done early again today, and reportedly all are pleased with the wound appearance.  We discussed the potential role of her gallstones and the importance of a "no-fat" diet.    Vital signs in last 24 hours: [min-max] current  Temp:  [98.1 F (36.7 C)-98.7 F (37.1 C)] 98.7 F (37.1 C) (07/09 0356) Pulse Rate:  [88-91] 91 (07/09 0356) Resp:  [18-19] 18 (07/09 0356) BP: (135-153)/(55-63) 153/59 (07/09 0356) SpO2:  [98 %-100 %] 100 % (07/09 0356) Weight:  [92.8 kg] 92.8 kg (07/09 0358)     Height: 5\' 8"  (172.7 cm) Weight: 92.8 kg BMI (Calculated): 31.11   Intake/Output last 2 shifts:  07/08 0701 - 07/09 0700 In: 713.3 [IV Piggyback:713.3] Out: 2300 [Urine:2300]   Physical Exam:  Constitutional: alert, cooperative and no distress  Respiratory: breathing non-labored at rest  Cardiovascular: regular rate and sinus rhythm  Gastrointestinal: soft, non-tender, and non-distended Integumentary: wound of right 5th toe amp site, clean and soft tissues viable.   Labs:  CBC Latest Ref Rng & Units 03/05/2021 03/04/2021 03/03/2021  WBC 4.0 - 10.5 K/uL 12.3(H) 11.7(H) 11.7(H)  Hemoglobin 12.0 - 15.0 g/dL 9.3(L) 8.4(L) 9.3(L)  Hematocrit 36.0 - 46.0 % 29.5(L) 26.5(L) 29.0(L)  Platelets 150 - 400 K/uL 408(H) 325 327   CMP Latest Ref Rng & Units 03/05/2021 03/04/2021 03/04/2021  Glucose 70 - 99 mg/dL 145(H) 260(H) 268(H)  BUN 8 - 23 mg/dL 19 22 -  Creatinine 0.44 - 1.00 mg/dL 1.42(H) 1.50(H) -  Sodium 135 - 145 mmol/L 138 136 -  Potassium 3.5 - 5.1 mmol/L 3.4(L) 3.0(L) -  Chloride 98 - 111 mmol/L 107 107 -  CO2 22 - 32 mmol/L 22 20(L) -  Calcium 8.9 - 10.3 mg/dL 8.2(L) 7.6(L) -  Total Protein 6.5 - 8.1 g/dL  6.7 5.8(L) -  Total Bilirubin 0.3 - 1.2 mg/dL 0.6 0.7 -  Alkaline Phos 38 - 126 U/L 291(H) 218(H) -  AST 15 - 41 U/L 95(H) 100(H) -  ALT 0 - 44 U/L 77(H) 76(H) -     Imaging studies: No new pertinent imaging studies   Assessment/Plan:  60 y.o. 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  - low/no fat diet in light of her gallstones.    -Continue daily dressing changes to ensure wound remains moist.  -Offloading and protection of operative site.  -Elevation of operative site.  All of the above findings and recommendations were discussed with the patient, and all of patient's questions were answered to her expressed satisfaction.  -- Ronny Bacon, M.D., FACS  03/05/2021  

## 2021-03-05 NOTE — Progress Notes (Signed)
Patient Demographics:    Nicole Golden, is a 60 y.o. female, DOB - 1961-07-18, ZWC:[id number]  Admit date - 02/28/2021   Admitting Physician [name], MD  Outpatient Primary MD for the patient is Nicole Moro, DO  LOS - 5   No chief complaint on file.       Subjective:    Nicole Golden patient had vomiting after eating yesterday.  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 ---Rt   Gallstones   Gangrene of toe of right foot ([place])  Brief Summary:- 60 year old with past medical history relevant for uncontrolled DM with peripheral neuropathy, HTN, HLD, CKD 3B, and chronic right small toe wound admitted on 02/28/2021 with sepsis secondary to right foot cellulitis after presenting  to the ED with malaise, fatigue, fevers, nausea and vomiting for the last 4 days - A/p 1)severe Sepsis Secondary to Right Foot Cellulitis/Acute Osteomyelitis --POA--patient met sepsis criteria on admission with WBC of 11.9, fever of 102.7, tachycardia with heart rate of 110 tachypnea with respiratory rate of 22- she also had organ dysfunction with AKI -Plain films of right foot shows possible osteomyelitis of fifth digit concern for necrotizing infection -Seen by general surgery and underwent amputation of right fifth digit on 7/7 -Intraoperative cultures positive for Enterobacter -Continue cefepime, discontinue vancomycin -Patient with significant right foot peripheral neuropathy in the setting of uncontrolled DM--- despite probing the wound and obtaining wound cultures patient had No foot pain  2)Cholelithiasis  without cholecystitis--- LFTs noted -CT abdomen and pelvis and MRCP noted, no biliary dilatation or choledocholithiasis -Discussed with general surgeon, no need for inpatient intervention at this time -Patient may benefit from outpatient consultation for possible elective lap chole  Hepatic Function Latest Ref Rng & Units 03/05/2021 03/04/2021 03/03/2021  Total Protein 6.5 - 8.1 g/dL 6.7 5.8(L) 6.3(L)  Albumin 3.5 - 5.0 g/dL 2.3(L) 2.0(L) 2.3(L)  AST 15 - 41 U/L 95(H) 100(H) 119(H)  ALT 0 - 44 U/L 77(H) 76(H) 94(H)  Alk Phosphatase 38 - 126 U/L 291(H) 218(H) 163(H)  Total Bilirubin 0.3 - 1.2 mg/dL 0.6 0.7 0.5  Bilirubin, Direct 0.0 - 0.2 mg/dL - - -   3)Acute pancreatitis with nausea and vomiting and abdominal pain---- -=patient with H/o  idiopathic pancreatitis, -Lipase is up to73, but has since normalized  no EtOH use  - Has nausea and vomiting----secondary to #1 and #2 above -Improved with IV fluids and IV antiemetics -She is tolerating heart healthy diet -Abdominal imaging studies as noted above in #2 without acute findings -Will need to follow-up with GI and general surgery as an outpatient -LFTs currently elevated, possibly reactive -No biliary obstruction noted on imaging and overall nausea, vomiting abdominal pain improved -Hold statin -Overall LFTs are trending down -Overall vomiting persists, may need to consider HIDA scan  4)Aki on CKD stage - 3B -Creatinine noted to be elevated on admission, up to 1.8, creatinine was 1.45 on 02/07/2021 --- renally adjust medications, avoid nephrotoxic  agents / dehydration  / hypotension -She has been hydrated with IV fluids and creatinine has returned to baseline  5)HTN--BPs not at goal, increase amlodipine to 10 mg daily, -- hold losartan and HCTZ due to sepsis and AKI -May use IV labetalol as needed elevated BP  6)DM2--A1c previously greater than 12 reflecting uncontrolled DM with hyperglycemia Use Novolog/Humalog Sliding scale insulin  with Accu-Cheks/Fingersticks as ordered  -Blood sugars are elevated today -We will increase Lantus dosing and add meal coverage NovoLog  7) chronic anemia most likely secondary to CKD--- hemoglobin is down to 9.7 from a baseline usually above 11 -- Patient also had some bleeding postoperatively which seems to have resolved now -Anemia work-up/labs including stool occult blood ordered and pending -May benefit from ESA/EPO agent intermittently -Continue to follow  8) lower extremity edema -Noted to have right lower extremity edema, possibly related to recent infection -Venous Dopplers negative for DVT -Appears to be improving with Lasix   Disposition/Need for in-Hospital Stay- patient unable to be discharged at this time due to sepsis secondary to right foot cellulitis and abscess in a diabetic patient requiring IV antibiotics and IVF  Status is: Inpatient  Remains inpatient appropriate because: See disposition above  Disposition: The patient is from: Home              Anticipated d/c is to: Home              Anticipated d/c date is: 3 days              Patient currently is not medically stable to d/c. Barriers: Not Clinically Stable-   Code Status : -  Code Status: Full Code   Family Communication:   (patient is alert, awake and coherent)  Consults  :  Gen surgery  DVT Prophylaxis  :   - SCDs  enoxaparin (LOVENOX) injection 40 mg Start: 03/04/21 0800 SCD's Start: 03/03/21 1944 Place and maintain sequential compression device Start: 02/28/21 1850    Lab Results  Component Value Date   PLT 408 (H) 03/05/2021    Inpatient Medications  Scheduled Meds:  amLODipine  10 mg Oral Daily   aspirin EC  81 mg Oral Q breakfast   enoxaparin (LOVENOX) injection  40 mg Subcutaneous Q24H   furosemide  20 mg Intravenous Daily   insulin aspart  0-9 Units Subcutaneous Q4H   insulin aspart  5 Units Subcutaneous TID WC   insulin glargine  70 Units Subcutaneous QHS   metoCLOPramide  5 mg  Oral TID AC   Continuous Infusions:  ceFEPime (MAXIPIME) IV 2 g (03/05/21 0852)   vancomycin Stopped (03/04/21 2040)   PRN Meds:.acetaminophen, hydrALAZINE, HYDROcodone-acetaminophen, labetalol, ondansetron **OR** ondansetron (ZOFRAN) IV    Anti-infectives (From admission, onward)    Start     Dose/Rate Route Frequency Ordered Stop   03/04/21 0800  ceFEPIme (MAXIPIME) 2 g in sodium chloride 0.9 % 100 mL IVPB        2 g 200 mL/hr over 30 Minutes Intravenous Every 12 hours 03/03/21 1840     03/03/21 1930  ceFEPIme (MAXIPIME) 2 g in sodium chloride 0.9 % 100 mL IVPB        2 g 200 mL/hr over 30 Minutes Intravenous  Once 03/03/21 1833 03/03/21 1952   03/02/21 1800  vancomycin (VANCOCIN) IVPB 1000 mg/200 mL premix  Status:  Discontinued        1,000 mg 200 mL/hr over 60 Minutes Intravenous Every 24 hours 03/01/21 1630 03/02/21 0852  03/02/21 1800  vancomycin (VANCOREADY) IVPB 1250 mg/250 mL        1,250 mg 166.7 mL/hr over 90 Minutes Intravenous Every 24 hours 03/02/21 0852     03/01/21 1800  metroNIDAZOLE (FLAGYL) IVPB 500 mg  Status:  Discontinued        500 mg 100 mL/hr over 60 Minutes Intravenous Every 8 hours 03/01/21 1647 03/03/21 1833   03/01/21 1730  vancomycin (VANCOREADY) IVPB 1500 mg/300 mL        1,500 mg 150 mL/hr over 120 Minutes Intravenous  Once 03/01/21 1624 03/01/21 1856   03/01/21 1715  vancomycin (VANCOCIN) IVPB 1000 mg/200 mL premix  Status:  Discontinued        1,000 mg 200 mL/hr over 60 Minutes Intravenous  Once 03/01/21 1622 03/01/21 1623         Objective:   Vitals:   03/04/21 2310 03/05/21 0356 03/05/21 0358 03/05/21 1352  BP:  (!) 153/59  (!) 160/68  Pulse:  91  94  Resp:  18  20  Temp: 98.6 F (37 C) 98.7 F (37.1 C)  97.8 F (36.6 C)  TempSrc: Oral Oral  Oral  SpO2:  100%  100%  Weight:   92.8 kg   Height:        Wt Readings from Last 3 Encounters:  03/05/21 92.8 kg  02/12/21 86.2 kg  02/07/21 86.2 kg     Intake/Output Summary  (Last 24 hours) at 03/05/2021 1748 Last data filed at 03/05/2021 1300 Gross per 24 hour  Intake 1113.25 ml  Output 2700 ml  Net -1586.75 ml     Physical Exam  General exam: Alert, awake, oriented x 3 Respiratory system: Clear to auscultation. Respiratory effort normal. Cardiovascular system:RRR. No murmurs, rubs, gallops. Gastrointestinal system: Abdomen is nondistended, soft and nontender. No organomegaly or masses felt. Normal bowel sounds heard. Central nervous system: Alert and oriented. No focal neurological deficits. Extremities: right foot with dressing, right lower leg edema is better today Skin: No rashes, lesions or ulcers Psychiatry: Judgement and insight appear normal. Mood & affect appropriate.      Data Review:   Micro Results Recent Results (from the past 240 hour(s))  SARS CORONAVIRUS 2 (TAT 6-24 HRS) Nasopharyngeal Nasopharyngeal Swab     Status: None   Collection Time: 02/28/21  8:58 PM   Specimen: Nasopharyngeal Swab  Result Value Ref Range Status   SARS Coronavirus 2 NEGATIVE NEGATIVE Final    Comment: (NOTE) SARS-CoV-2 target nucleic acids are NOT DETECTED.  The SARS-CoV-2 RNA is generally detectable in upper and lower respiratory specimens during the acute phase of infection. Negative results do not preclude SARS-CoV-2 infection, do not rule out co-infections with other pathogens, and should not be used as the sole basis for treatment or other patient management decisions. Negative results must be combined with clinical observations, patient history, and epidemiological information. The expected result is Negative.  Fact Sheet for Patients: SugarRoll.be  Fact Sheet for Healthcare Providers: https://www.woods-mathews.com/  This test is not yet approved or cleared by the [place] FDA and  has been authorized for detection and/or diagnosis of SARS-CoV-2 by FDA under an Emergency Use Authorization (EUA).  This EUA will remain  in effect (meaning this test can be used) for the duration of the COVID-19 declaration under Se ction 564(b)(1) of the Act, 21 U.S.C. section 360bbb-3(b)(1), unless the authorization is terminated or revoked sooner.  Performed at [facility], [place] [address] [place], [place] [address]  Aerobic/Anaerobic Culture w Gram Stain (surgical/deep wound)     Status: None (Preliminary result)   Collection Time: 03/01/21  4:20 PM   Specimen: Foot; Abscess  Result Value Ref Range Status   Specimen Description   Final    FOOT Performed at [facility], [address], [place], [place] [address]    Special Requests   Final    NONE Performed at [facility], [address], [place], [place] [address]    Gram Stain   Final    RARE WBC PRESENT, PREDOMINANTLY PMN ABUNDANT GRAM POSITIVE COCCI Performed at [facility], [place] [address], [place], [place] [address]    Culture   Final    ABUNDANT ENTEROBACTER CLOACAE NO ANAEROBES ISOLATED; CULTURE IN PROGRESS FOR 5 DAYS    Report Status PENDING  Incomplete   Organism ID, Bacteria ENTEROBACTER CLOACAE  Final      Susceptibility   Enterobacter cloacae - MIC*    CEFAZOLIN >=64 RESISTANT Resistant     CEFEPIME <=0.12 SENSITIVE Sensitive     CEFTAZIDIME <=1 SENSITIVE Sensitive     CIPROFLOXACIN <=0.25 SENSITIVE Sensitive     GENTAMICIN <=1 SENSITIVE Sensitive     IMIPENEM <=0.25 SENSITIVE Sensitive     TRIMETH/SULFA <=20 SENSITIVE Sensitive     PIP/TAZO <=4 SENSITIVE Sensitive     * ABUNDANT ENTEROBACTER CLOACAE  Culture, blood (Routine X 2) w Reflex to ID Panel     Status: None (Preliminary result)   Collection Time: 03/01/21  5:57 PM   Specimen: BLOOD RIGHT ARM  Result Value Ref Range Status   Specimen Description BLOOD RIGHT ARM  Final   Special Requests   Final    BOTTLES DRAWN AEROBIC AND ANAEROBIC Blood Culture adequate volume   Culture   Final    NO GROWTH 4 DAYS Performed at [facility], [address] [place], [place] [address]    Report Status PENDING  Incomplete  Culture, blood (Routine X 2) w Reflex to ID Panel     Status: None (Preliminary result)   Collection Time: 03/01/21  5:57 PM   Specimen: BLOOD LEFT HAND  Result Value Ref Range Status   Specimen Description BLOOD LEFT HAND  Final   Special Requests   Final    BOTTLES DRAWN AEROBIC AND ANAEROBIC Blood Culture adequate volume   Culture   Final    NO GROWTH 4 DAYS Performed at [facility], [address] [place], [place] [address]    Report Status PENDING  Incomplete  Surgical pcr screen     Status: None   Collection Time: 03/03/21  5:03 AM   Specimen: Nasal Mucosa; Nasal Swab  Result Value Ref Range Status   MRSA, PCR NEGATIVE NEGATIVE Final   Staphylococcus aureus NEGATIVE NEGATIVE Final    Comment: (NOTE) The Xpert SA Assay (FDA approved for NASAL specimens in patients 61 years of age and older), is one component of a comprehensive surveillance program. It is not intended to diagnose infection nor to guide or monitor treatment. Performed at [facility], [address], [place], [place] [address]   Aerobic/Anaerobic Culture w Gram Stain (surgical/deep wound)     Status: None (Preliminary result)   Collection Time: 03/03/21  8:50 AM   Specimen: PATH Other; Tissue  Result Value Ref Range Status   Specimen Description   Final    WOUND Performed at [facility], [address] [place], [place] [address]    Special Requests   Final  NONE Performed at [facility], [address], [place], [place] [address]    Gram Stain   Final    RARE WBC PRESENT,BOTH PMN AND MONONUCLEAR ABUNDANT GRAM POSITIVE COCCI FEW GRAM VARIABLE ROD Performed at [facility], [place] [address], [place], [place] [address]    Culture   Final    MODERATE ENTEROBACTER CLOACAE NO ANAEROBES ISOLATED; CULTURE IN PROGRESS FOR 5 DAYS    Report Status PENDING  Incomplete   Organism ID, Bacteria ENTEROBACTER CLOACAE  Final       Susceptibility   Enterobacter cloacae - MIC*    CEFAZOLIN >=64 RESISTANT Resistant     CEFEPIME <=0.12 SENSITIVE Sensitive     CEFTAZIDIME <=1 SENSITIVE Sensitive     CIPROFLOXACIN <=0.25 SENSITIVE Sensitive     GENTAMICIN <=1 SENSITIVE Sensitive     IMIPENEM <=0.25 SENSITIVE Sensitive     TRIMETH/SULFA <=20 SENSITIVE Sensitive     PIP/TAZO <=4 SENSITIVE Sensitive     * MODERATE ENTEROBACTER CLOACAE    Radiology Reports CT ABDOMEN PELVIS WO CONTRAST  Result Date: 02/28/2021 CLINICAL DATA:  Nonlocalized acute abdominal pain. EXAM: CT ABDOMEN AND PELVIS WITHOUT CONTRAST TECHNIQUE: Multidetector CT imaging of the abdomen and pelvis was performed following the standard protocol without IV contrast. COMPARISON:  Ultrasound pelvis 02/04/2018. FINDINGS: Lower chest: No acute abnormality. Hepatobiliary: No focal liver abnormality. Tiny calcified stones noted within the gallbladder lumen. No gallbladder wall thickening or pericholecystic fluid. No biliary dilatation. Pancreas: No focal lesion. Slight hazy pancreatic contour likely due to motion artifact. Otherwise normal pancreatic contour. No surrounding inflammatory changes. No main pancreatic ductal dilatation. Spleen: Normal in size without focal abnormality. A splenule is noted. Adrenals/Urinary Tract: No adrenal nodule bilaterally. Bilateral kidneys enhance symmetrically. No hydronephrosis. No hydroureter. The urinary bladder is unremarkable. Stomach/Bowel: Stomach is within normal limits. No evidence of bowel wall thickening or dilatation. Appendix appears normal. Vascular/Lymphatic: No abdominal aorta or iliac aneurysm. At least moderate atherosclerotic plaque of the aorta and its branches. No abdominal, pelvic, or inguinal lymphadenopathy. Reproductive: Redemonstration of a coarsely calcified left subserosal fibroid measuring up to at least 5.5 cm. A central coarsely calcified or in lesion is also noted likely representing known submucosal uterine  fibroid. Bilateral adnexa are otherwise unremarkable. Other: No intraperitoneal free fluid. No intraperitoneal free gas. No organized fluid collection. Musculoskeletal: No abdominal wall hernia or abnormality. No suspicious lytic or blastic osseous lesions. No acute displaced fracture. IMPRESSION: 1. Slight hazy pancreatic contour likely due to motion artifact. Differential diagnosis includes acute pancreatitis. Recommend correlation with lipase levels. 2. Cholelithiasis no CT findings of acute cholecystitis. 3. Subserosal and submucosal degenerative uterine fibroids. 4. Please note limited evaluation on this noncontrast study. Electronically Signed   By: Iven Finn M.D.   On: 02/28/2021 17:06   MR ABDOMEN MRCP WO CONTRAST  Result Date: 03/01/2021 CLINICAL DATA:  Abdominal pain.  Cholelithiasis. EXAM: MRI ABDOMEN WITHOUT CONTRAST  (INCLUDING MRCP) TECHNIQUE: Multiplanar multisequence MR imaging of the abdomen was performed. Heavily T2-weighted images of the biliary and pancreatic ducts were obtained, and three-dimensional MRCP images were rendered by post processing. COMPARISON:  CT abdomen 02/28/2021 FINDINGS: Despite efforts by the technologist and patient, motion artifact is present on today's exam and could not be eliminated. This reduces exam sensitivity and specificity. Lower chest: Unremarkable Hepatobiliary: The small dependent gallstone in the gallbladder was better seen at CT. No biliary dilatation. No discrete filling defect in the common bile duct which measures 0.5 cm in diameter and  which demonstrates normal conical tapering. No significant focal liver lesion identified. Pancreas:  Unremarkable Spleen:  Unremarkable Adrenals/Urinary Tract:  Both adrenal glands appear normal. 0.9 cm fluid density lesion of the left mid kidney anteriorly on image 21 series 4, probably a cyst. Similar 0.7 cm lesion of the right kidney upper pole on image 25 series 5. Stomach/Bowel: Unremarkable Vascular/Lymphatic:   Aortoiliac atherosclerotic vascular disease. Other: Uterine fibroids are observed on the coronal T2 weighted images. Musculoskeletal: Unremarkable IMPRESSION: 1. No biliary dilatation or appreciable choledocholithiasis on today's examination. The patient has known cholelithiasis based on recent CT although previously seen gallstone is less conspicuous on today's MRI. 2. Small fluid density renal lesions favoring cysts. 3.  Aortic Atherosclerosis (ICD10-I70.0). 4. Uterine fibroids. Electronically Signed   By: Van Clines M.D.   On: 03/01/2021 10:47   MR 3D Recon At Scanner  Result Date: 03/02/2021 : Please see accession [id number] [facility] for MRCP report. Electronically Signed   By: Van Clines M.D.   On: 03/02/2021 08:06   US Venous Img Lower Unilateral Right (DVT)  Result Date: 03/04/2021 CLINICAL DATA:  Right lower extremity edema. Recent right fifth toe amputation. EXAM: RIGHT LOWER EXTREMITY VENOUS DOPPLER ULTRASOUND TECHNIQUE: Gray-scale sonography with graded compression, as well as color Doppler and duplex ultrasound were performed to evaluate the lower extremity deep venous systems from the level of the common femoral vein and including the common femoral, femoral, profunda femoral, popliteal and calf veins including the posterior tibial, peroneal and gastrocnemius veins when visible. The superficial great saphenous vein was also interrogated. Spectral Doppler was utilized to evaluate flow at rest and with distal augmentation maneuvers in the common femoral, femoral and popliteal veins. COMPARISON:  None. FINDINGS: Contralateral Common Femoral Vein: Respiratory phasicity is normal and symmetric with the symptomatic side. No evidence of thrombus. Normal compressibility. Common Femoral Vein: No evidence of thrombus. Normal compressibility, respiratory phasicity and response to augmentation. Saphenofemoral Junction: No evidence of thrombus. Normal compressibility and flow on color Doppler  imaging. Profunda Femoral Vein: No evidence of thrombus. Normal compressibility and flow on color Doppler imaging. Femoral Vein: No evidence of thrombus. Normal compressibility, respiratory phasicity and response to augmentation. Popliteal Vein: No evidence of thrombus. Normal compressibility, respiratory phasicity and response to augmentation. Calf Veins: No evidence of thrombus. Normal compressibility and flow on color Doppler imaging. Superficial Great Saphenous Vein: No evidence of thrombus. Normal compressibility. Venous Reflux:  None. Other Findings: No evidence of superficial thrombophlebitis or abnormal fluid collection. IMPRESSION: No evidence of right lower extremity deep venous thrombosis. Electronically Signed   By: Aletta Edouard M.D.   On: 03/04/2021 16:13   US ARTERIAL SEG MULTIPLE LE (ABI, SEGMENTAL PRESSURES, PVR'S)  Result Date: 03/02/2021 CLINICAL DATA:  Right toe gangrene EXAM: NONINVASIVE PHYSIOLOGIC VASCULAR STUDY OF BILATERAL LOWER EXTREMITIES TECHNIQUE: Non-invasive vascular evaluation of both lower extremities was performed at rest, including calculation of ankle-brachial indices, multiple segmental pressure evaluation, segmental Doppler and segmental pulse volume recording. COMPARISON:  None. FINDINGS: Right Lower Extremity Resting ABI:  0.73 Segmental Pressures: Significant pressure gradient between the upper and lower thigh cuff consistent with SFA disease. Additionally, pressure gradients are present between the calf and ankle cuff consistent with below the knee disease. Arterial Waveforms: Transition to abnormal monophasic arterial waveform in the popliteal artery consistent with SFA occlusive disease. PVRs: Markedly blunted PVRs in the digits likely reflects severe small vessel disease. Left Lower Extremity: Resting ABI: 0.91 Segmental Pressures: Pressure gradient between the lower thigh and calf cuffs consistent  with distal femoropopliteal disease. Arterial Waveforms: Irregular  arterial waveforms likely affected by artifact. PVRs: Digital waveforms are relatively preserved. Other: Symmetric upper extremity pressures. Ankle Brachial index > 1.4 Non diagnostic secondary to incompressible vessel calcifications 1.0-1.4       Normal 0.9-0.99     Borderline PAD 0.8-0.89     Mild PAD 0.5-0.79     Moderate PAD < 0.5          Severe PAD Toe Brachial Index Normal     >0.65 Moderate  0.53-0.64 Severe     <0.23 Toe Pressures Absolute toe pressure >29mHg sufficient for wound healing. Toe pressures <558mg = critical limb ischemia. IMPRESSION: 1. Abnormal resting right ankle-brachial index of 0.73 consistent with at least moderate peripheral arterial disease. 2. Segmental evaluation suggests outflow (superficial femoral) occlusive disease as well as runoff (below the knee) disease. Additionally, there is significant diminution of the arterial waveforms in the digits consistent with small vessel disease. 3. Resting left ankle-brachial index of 0.91 consistent with borderline peripheral arterial disease. 4. Segmental evaluation suggests an element of stenosis in the distal femoropopliteal segment on the left. Signed, HeCriselda PeachesMD, RPAngelsascular and Interventional Radiology Specialists [facility]adiology Electronically Signed   By: HeJacqulynn Cadet.D.   On: 03/02/2021 12:08   DG Abd Acute W/Chest  Result Date: 02/28/2021 CLINICAL DATA:  Vomiting. EXAM: DG ABDOMEN ACUTE WITH 1 VIEW CHEST COMPARISON:  None. FINDINGS: There is no evidence of dilated bowel loops or free intraperitoneal air. Calcified uterine fibroid is noted in the pelvis. Heart size and mediastinal contours are within normal limits. Both lungs are clear. IMPRESSION: No abnormal bowel dilatation.  No acute cardiopulmonary disease. Electronically Signed   By: JaMarijo Conception.D.   On: 02/28/2021 13:39   DG Foot Complete Right  Result Date: 03/01/2021 CLINICAL DATA:  Swelling with yellow fluid EXAM: RIGHT FOOT COMPLETE -  3+ VIEW COMPARISON:  None. FINDINGS: No fracture or malalignment. Moderate gas within the dorsal soft tissues of the foot extending to the anterior ankle region concerning for gas forming/necrotizing infection. Possible ulceration of the fifth digit at the level of the IP joint. Possible small erosion at the head of the fifth proximal phalanx. IMPRESSION: 1. Soft tissue swelling. Gas within the soft tissues of the dorsum of the foot and anterior to the ankle raises concern for gas forming/necrotizing infection 2. Possible ulceration of the fifth digit. Suspect small cortical erosion at the head of the fifth proximal phalanx raising concern for osteomyelitis These results will be called to the ordering clinician or representative by the Radiologist Assistant, and communication documented in the PACS or [employer] CorporationElectronically Signed   By: KiDonavan Foil.D.   On: 03/01/2021 17:00     CBC Recent Labs  Lab 02/28/21 1305 03/01/21 0624 03/02/21 0443 03/03/21 0538 03/04/21 0538 03/05/21 0444  WBC 11.9* 10.1 11.3* 11.7* 11.7* 12.3*  HGB 10.9* 9.3* 9.7* 9.3* 8.4* 9.3*  HCT 34.6* 29.3* 31.1* 29.0* 26.5* 29.5*  PLT 242 226 281 327 325 408*  MCV 81.4 80.5 80.6 80.3 79.8* 81.0  MCH 25.6* 25.5* 25.1* 25.8* 25.3* 25.5*  MCHC 31.5 31.7 31.2 32.1 31.7 31.5  RDW 13.9 14.0 14.1 14.6 15.0 15.5  LYMPHSABS 1.1 1.2  --   --   --   --   MONOABS 0.9 0.7  --   --   --   --   EOSABS 0.0 0.1  --   --   --   --  BASOSABS 0.0 0.0  --   --   --   --     Chemistries  Recent Labs  Lab 03/01/21 0624 03/02/21 0443 03/03/21 0538 03/04/21 0108 03/04/21 0538 03/05/21 0444  NA 136 135 136  --  136 138  K 3.5 3.2* 3.2*  --  3.0* 3.4*  CL 105 104 106  --  107 107  CO2 21* 22 21*  --  20* 22  GLUCOSE 253* 207* 158* 268* 260* 145*  BUN 38* 28* 23  --  22 19  CREATININE 1.77* 1.36* 1.41*  --  1.50* 1.42*  CALCIUM 8.3* 8.4* 8.3*  --  7.6* 8.2*  MG  --   --   --   --  1.8  --   AST 59* 138* 119*  --  100*  95*  ALT 42 86* 94*  --  76* 77*  ALKPHOS 81 139* 163*  --  218* 291*  BILITOT 0.7 0.8 0.5  --  0.7 0.6   ------------------------------------------------------------------------------------------------------------------ No results for input(s): CHOL, HDL, LDLCALC, TRIG, CHOLHDL, LDLDIRECT in the last 72 hours.  Lab Results  Component Value Date   HGBA1C 13.1 (H) 02/28/2021   ------------------------------------------------------------------------------------------------------------------ No results for input(s): TSH, T4TOTAL, T3FREE, THYROIDAB in the last 72 hours.  Invalid input(s): FREET3 ------------------------------------------------------------------------------------------------------------------ No results for input(s): VITAMINB12, FOLATE, FERRITIN, TIBC, IRON, RETICCTPCT in the last 72 hours.   Coagulation profile No results for input(s): INR, PROTIME in the last 168 hours.  No results for input(s): DDIMER in the last 72 hours.  Cardiac Enzymes No results for input(s): CKMB, TROPONINI, MYOGLOBIN in the last 168 hours.  Invalid input(s): CK ------------------------------------------------------------------------------------------------------------------ No results found for: BNP   Kathie Dike M.D on 03/05/2021 at 5:48 PM  Go to [email or web address] - for contact info  [facility] - Office  [phone]

## 2021-03-05 NOTE — Progress Notes (Signed)
Has been eating well today with no N&V until ate pudding atter supper and vomited that and juice.  Family brought large bottle of juice which is likely what increased her glucose to over 300 this evening,  Encouraged to have family bring low or zero sugar drinks.  Gave zofran for nausea

## 2021-03-05 NOTE — Progress Notes (Addendum)
Patient has had 3 episodes of vomiting AFTER ingesting PO fluids, water or soda. Meds administered as ordered. Will continue to monitor. MD notified.

## 2021-03-06 LAB — GLUCOSE, CAPILLARY
Glucose-Capillary: 227 mg/dL — ABNORMAL HIGH (ref 70–99)
Glucose-Capillary: 181 mg/dL — ABNORMAL HIGH (ref 70–99)
Glucose-Capillary: 186 mg/dL — ABNORMAL HIGH (ref 70–99)
Glucose-Capillary: 263 mg/dL — ABNORMAL HIGH (ref 70–99)
Glucose-Capillary: 261 mg/dL — ABNORMAL HIGH (ref 70–99)

## 2021-03-06 LAB — AEROBIC/ANAEROBIC CULTURE W GRAM STAIN (SURGICAL/DEEP WOUND)

## 2021-03-06 LAB — COMPREHENSIVE METABOLIC PANEL
ALT: 85 U/L — ABNORMAL HIGH (ref 0–44)
AST: 100 U/L — ABNORMAL HIGH (ref 15–41)
Albumin: 2.2 g/dL — ABNORMAL LOW (ref 3.5–5.0)
Alkaline Phosphatase: 353 U/L — ABNORMAL HIGH (ref 38–126)
Anion gap: 8 (ref 5–15)
BUN: 18 mg/dL (ref 8–23)
CO2: 23 mmol/L (ref 22–32)
Calcium: 8 mg/dL — ABNORMAL LOW (ref 8.9–10.3)
Chloride: 102 mmol/L (ref 98–111)
Creatinine, Ser: 1.25 mg/dL — ABNORMAL HIGH (ref 0.44–1.00)
GFR, Estimated: 49 mL/min — ABNORMAL LOW (ref >60)
Glucose, Bld: 212 mg/dL — ABNORMAL HIGH (ref 70–99)
Potassium: 4.1 mmol/L (ref 3.5–5.1)
Sodium: 133 mmol/L — ABNORMAL LOW (ref 135–145)
Total Bilirubin: 0.2 mg/dL — ABNORMAL LOW (ref 0.3–1.2)
Total Protein: 6.9 g/dL (ref 6.5–8.1)

## 2021-03-06 LAB — CBC
HCT: 29.2 % — ABNORMAL LOW (ref 36.0–46.0)
Hemoglobin: 9.2 g/dL — ABNORMAL LOW (ref 12.0–15.0)
MCH: 25.3 pg — ABNORMAL LOW (ref 26.0–34.0)
MCHC: 31.5 g/dL (ref 30.0–36.0)
MCV: 80.4 fL (ref 80.0–100.0)
Platelets: 486 10*3/uL — ABNORMAL HIGH (ref 150–400)
RBC: 3.63 MIL/uL — ABNORMAL LOW (ref 3.87–5.11)
RDW: 15.8 % — ABNORMAL HIGH (ref 11.5–15.5)
WBC: 12.8 10*3/uL — ABNORMAL HIGH (ref 4.0–10.5)
nRBC: 0 % (ref 0.0–0.2)

## 2021-03-06 LAB — CULTURE, BLOOD (ROUTINE X 2)
Culture: NO GROWTH
Culture: NO GROWTH
Special Requests: ADEQUATE
Special Requests: ADEQUATE

## 2021-03-06 MED ORDER — POLYETHYLENE GLYCOL 3350 17 G PO PACK
17.0000 g | PACK | Freq: Two times a day (BID) | ORAL | Status: DC
  Filled 2021-03-06 (×4): qty 1

## 2021-03-06 MED ORDER — SORBITOL 70 % SOLN
960.0000 mL | TOPICAL_OIL | Freq: Once | ORAL | Status: AC
  Administered 2021-03-06: 960 mL via RECTAL
  Filled 2021-03-06: qty 473

## 2021-03-06 MED ORDER — FUROSEMIDE 20 MG PO TABS
20.0000 mg | ORAL_TABLET | Freq: Every day | ORAL | Status: DC
  Administered 2021-03-07 – 2021-03-08 (×2): 20 mg via ORAL
  Filled 2021-03-06 (×2): qty 1

## 2021-03-06 NOTE — Progress Notes (Signed)
Patient Demographics:    Nicole Golden, is a 60 y.o. female, DOB - [birth year], EBX:[id number]  Admit date - 02/28/2021   Admitting Physician [name], MD  Outpatient Primary MD for the patient is [name], DO  LOS - 6   No chief complaint on file.       Subjective:    Nicole Golden had recurrent vomiting yesterday after eating. Feels as though it may be related to potassium pills. She had eaten mac and cheese before vomiting began  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 ---Rt   Gallstones   Gangrene of toe of right foot ([place])  Brief Summary:- 60 year old with past medical history relevant for uncontrolled DM with peripheral neuropathy, HTN, HLD, CKD 3B, and chronic right small toe wound admitted on 02/28/2021 with sepsis secondary to right foot cellulitis after presenting  to the ED with malaise, fatigue, fevers, nausea and vomiting for the last 4 days - A/p 1)severe Sepsis Secondary to Right Foot Cellulitis/Acute Osteomyelitis --POA--patient met sepsis criteria on admission with WBC of 11.9, fever of 102.7, tachycardia with heart rate of 110 tachypnea with respiratory rate of 22- she also had organ dysfunction with AKI -Plain films of right foot shows possible osteomyelitis of fifth digit concern for necrotizing infection -Seen by general surgery and underwent amputation of right fifth digit on 7/7 -Intraoperative cultures positive for Enterobacter -Continue cefepime -discussed with infectious disease and after reviewing pain films, there were concern for more extensive infection  that extended beyond toe -recommendations were for MRI of foot to evaluate for any septic joints (ankle?) -continue on IV antibiotics for now -Patient with significant right foot peripheral neuropathy in the setting of uncontrolled DM--- despite probing the wound and obtaining wound cultures patient had No foot pain  2)Cholelithiasis without cholecystitis--- LFTs noted -CT abdomen and pelvis and MRCP noted, no biliary dilatation or choledocholithiasis -Discussed with general surgeon, no need for inpatient intervention at this time -Patient may benefit from outpatient consultation for possible elective lap chole  Hepatic Function Latest Ref Rng & Units 03/06/2021 03/05/2021 03/04/2021  Total Protein 6.5 - 8.1 g/dL 6.9 6.7 5.8(L)  Albumin 3.5 - 5.0 g/dL 2.2(L) 2.3(L) 2.0(L)  AST 15 - 41 U/L 100(H) 95(H) 100(H)  ALT 0 - 44 U/L 85(H) 77(H) 76(H)  Alk Phosphatase 38 - 126 U/L 353(H) 291(H) 218(H)  Total Bilirubin 0.3 - 1.2 mg/dL 0.2(L) 0.6 0.7  Bilirubin, Direct 0.0 - 0.2 mg/dL - - -   3)Acute pancreatitis with nausea and vomiting and abdominal pain---- -=patient with H/o  idiopathic pancreatitis, -Lipase was up to 73, but has since normalized  no EtOH use  - Has nausea and vomiting----secondary to #1 and #2 above -Improved with IV fluids and IV antiemetics -Abdominal imaging studies as noted above in #2 without acute findings -Will need to follow-up with GI and general surgery as an outpatient -LFTs currently elevated -No biliary obstruction noted on imaging  -Hold  statin -Overall LFTs remain mildly elevated -since vomiting has persisted, will order HIDA scan in AM  4)Aki on CKD stage - 3B -Creatinine noted to be elevated on admission, up to 1.8, creatinine was 1.45 on 02/07/2021 --- renally adjust medications, avoid nephrotoxic agents / dehydration  / hypotension -She has been hydrated with IV fluids and creatinine has returned to baseline  5)HTN--currently on amlodipine -blood  pressures stable -- hold losartan and HCTZ due to sepsis and AKI -May use IV labetalol as needed elevated BP  6)DM2--A1c previously greater than 12 reflecting uncontrolled DM with hyperglycemia Use Novolog/Humalog Sliding scale insulin with Accu-Cheks/Fingersticks as ordered  -Blood sugars are stable -continue lantus and novolog  7) chronic anemia most likely secondary to CKD--- hemoglobin is down to 9.7 from a baseline usually above 11 -- Patient also had some bleeding postoperatively which seems to have resolved now -Anemia work-up/labs including stool occult blood ordered and pending -May benefit from ESA/EPO agent intermittently -Continue to follow  8) lower extremity edema -Noted to have right lower extremity edema, possibly related to recent infection -Venous Dopplers negative for DVT -Appears to be improving with Lasix   Disposition/Need for in-Hospital Stay- patient unable to be discharged at this time due to sepsis secondary to right foot cellulitis and abscess in a diabetic patient requiring IV antibiotics and IVF  Status is: Inpatient  Remains inpatient appropriate because: See disposition above  Disposition: The patient is from: Home              Anticipated d/c is to: Home              Anticipated d/c date is: 3 days              Patient currently is not medically stable to d/c. Barriers: Not Clinically Stable-   Code Status : -  Code Status: Full Code   Family Communication:   (patient is alert, awake and coherent)  Consults  :  Gen surgery  DVT Prophylaxis  :   - SCDs  enoxaparin (LOVENOX) injection 40 mg Start: 03/04/21 0800 SCD's Start: 03/03/21 1944 Place and maintain sequential compression device Start: 02/28/21 1850    Lab Results  Component Value Date   PLT 486 (H) 03/06/2021    Inpatient Medications  Scheduled Meds:  amLODipine  10 mg Oral Daily   aspirin EC  81 mg Oral Q breakfast   enoxaparin (LOVENOX) injection  40 mg Subcutaneous Q24H    furosemide  20 mg Intravenous Daily   insulin aspart  0-9 Units Subcutaneous Q4H   insulin aspart  10 Units Subcutaneous TID WC   insulin glargine  70 Units Subcutaneous QHS   metoCLOPramide  5 mg Oral TID AC   Continuous Infusions:  ceFEPime (MAXIPIME) IV 2 g (03/06/21 0848)   PRN Meds:.acetaminophen, hydrALAZINE, HYDROcodone-acetaminophen, labetalol, ondansetron **OR** ondansetron (ZOFRAN) IV    Anti-infectives (From admission, onward)    Start     Dose/Rate Route Frequency Ordered Stop   03/04/21 0800  ceFEPIme (MAXIPIME) 2 g in sodium chloride 0.9 % 100 mL IVPB        2 g 200 mL/hr over 30 Minutes Intravenous Every 12 hours 03/03/21 1840     03/03/21 1930  ceFEPIme (MAXIPIME) 2 g in sodium chloride 0.9 % 100 mL IVPB        2 g 200 mL/hr over 30 Minutes Intravenous  Once 03/03/21 1833 03/03/21 1952   03/02/21 1800  vancomycin (VANCOCIN) IVPB 1000 mg/200 mL  premix  Status:  Discontinued        1,000 mg 200 mL/hr over 60 Minutes Intravenous Every 24 hours 03/01/21 1630 03/02/21 0852   03/02/21 1800  vancomycin (VANCOREADY) IVPB 1250 mg/250 mL  Status:  Discontinued        1,250 mg 166.7 mL/hr over 90 Minutes Intravenous Every 24 hours 03/02/21 0852 03/05/21 1812   03/01/21 1800  metroNIDAZOLE (FLAGYL) IVPB 500 mg  Status:  Discontinued        500 mg 100 mL/hr over 60 Minutes Intravenous Every 8 hours 03/01/21 1647 03/03/21 1833   03/01/21 1730  vancomycin (VANCOREADY) IVPB 1500 mg/300 mL        1,500 mg 150 mL/hr over 120 Minutes Intravenous  Once 03/01/21 1624 03/01/21 1856   03/01/21 1715  vancomycin (VANCOCIN) IVPB 1000 mg/200 mL premix  Status:  Discontinued        1,000 mg 200 mL/hr over 60 Minutes Intravenous  Once 03/01/21 1622 03/01/21 1623         Objective:   Vitals:   03/05/21 1352 03/05/21 1940 03/06/21 0435 03/06/21 0438  BP: (!) 160/68 (!) 144/51 (!) 145/55   Pulse: 94 95 82   Resp: _0 Temp: 97.8 F (36.6 C) 98.2 F (36.8 C) 98.1 F (36.7 C)    TempSrc: Oral Oral Oral   SpO2: 100% 100% 100%   Weight:    87.8 kg  Height:        Wt Readings from Last 3 Encounters:  03/06/21 87.8 kg  02/12/21 86.2 kg  02/07/21 86.2 kg     Intake/Output Summary (Last 24 hours) at 03/06/2021 1616 Last data filed at 03/06/2021 1300 Gross per 24 hour  Intake 680 ml  Output 1350 ml  Net -670 ml     Physical Exam  General exam: Alert, awake, oriented x 3 Respiratory system: Clear to auscultation. Respiratory effort normal. Cardiovascular system:RRR. No murmurs, rubs, gallops. Gastrointestinal system: Abdomen is nondistended, soft and nontender. No organomegaly or masses felt. Normal bowel sounds heard. Central nervous system: Alert and oriented. No focal neurological deficits. Extremities: right foot with dressing in place, RLE edema is better Skin: No rashes, lesions or ulcers Psychiatry: Judgement and insight appear normal. Mood & affect appropriate.       Data Review:   Micro Results Recent Results (from the past 240 hour(s))  SARS CORONAVIRUS 2 (TAT 6-24 HRS) Nasopharyngeal Nasopharyngeal Swab     Status: None   Collection Time: 02/28/21  8:58 PM   Specimen: Nasopharyngeal Swab  Result Value Ref Range Status   SARS Coronavirus 2 NEGATIVE NEGATIVE Final    Comment: (NOTE) SARS-CoV-2 target nucleic acids are NOT DETECTED.  The SARS-CoV-2 RNA is generally detectable in upper and lower respiratory specimens during the acute phase of infection. Negative results do not preclude SARS-CoV-2 infection, do not rule out co-infections with other pathogens, and should not be used as the sole basis for treatment or other patient management decisions. Negative results must be combined with clinical observations, patient history, and epidemiological information. The expected result is Negative.  Fact Sheet for Patients: SugarRoll.be  Fact Sheet for Healthcare  Providers: https://www.woods-mathews.com/  This test is not yet approved or cleared by the [place] FDA and  has been authorized for detection and/or diagnosis of SARS-CoV-2 by FDA under an Emergency Use Authorization (EUA). This EUA will remain  in effect (meaning this test can be used) for the duration of the COVID-19 declaration under  Se ction 564(b)(1) of the Act, 21 U.S.C. section 360bbb-3(b)(1), unless the authorization is terminated or revoked sooner.  Performed at [facility], [place] [address], [place], [place] [address]   Aerobic/Anaerobic Culture w Gram Stain (surgical/deep wound)     Status: None   Collection Time: 03/01/21  4:20 PM   Specimen: Foot; Abscess  Result Value Ref Range Status   Specimen Description   Final    FOOT Performed at [facility], [address], [place], [place] [address]    Special Requests   Final    NONE Performed at [facility], [address] [place], [place] [address]    Gram Stain   Final    RARE WBC PRESENT, PREDOMINANTLY PMN ABUNDANT GRAM POSITIVE COCCI    Culture   Final    ABUNDANT ENTEROBACTER CLOACAE WITHIN MIXED ORGANISMS NO ANAEROBES ISOLATED Performed at [facility], [address], [place], [place] [address]    Report Status 03/06/2021 FINAL  Final   Organism ID, Bacteria ENTEROBACTER CLOACAE  Final      Susceptibility   Enterobacter cloacae - MIC*    CEFAZOLIN >=64 RESISTANT Resistant     CEFEPIME <=0.12 SENSITIVE Sensitive     CEFTAZIDIME <=1 SENSITIVE Sensitive     CIPROFLOXACIN <=0.25 SENSITIVE Sensitive     GENTAMICIN <=1 SENSITIVE Sensitive     IMIPENEM <=0.25 SENSITIVE Sensitive     TRIMETH/SULFA <=20 SENSITIVE Sensitive     PIP/TAZO <=4 SENSITIVE Sensitive     * ABUNDANT ENTEROBACTER CLOACAE  Culture, blood (Routine X 2) w Reflex to ID Panel     Status: None   Collection Time: 03/01/21  5:57 PM   Specimen: BLOOD RIGHT ARM  Result Value Ref Range Status   Specimen Description  BLOOD RIGHT ARM  Final   Special Requests   Final    BOTTLES DRAWN AEROBIC AND ANAEROBIC Blood Culture adequate volume   Culture   Final    NO GROWTH 5 DAYS Performed at [facility], [address], [place], [place] [address]    Report Status 03/06/2021 FINAL  Final  Culture, blood (Routine X 2) w Reflex to ID Panel     Status: None   Collection Time: 03/01/21  5:57 PM   Specimen: BLOOD LEFT HAND  Result Value Ref Range Status   Specimen Description BLOOD LEFT HAND  Final   Special Requests   Final    BOTTLES DRAWN AEROBIC AND ANAEROBIC Blood Culture adequate volume   Culture   Final    NO GROWTH 5 DAYS Performed at [facility], [address] [place], [place] [address]    Report Status 03/06/2021 FINAL  Final  Surgical pcr screen     Status: None   Collection Time: 03/03/21  5:03 AM   Specimen: Nasal Mucosa; Nasal Swab  Result Value Ref Range Status   MRSA, PCR NEGATIVE NEGATIVE Final   Staphylococcus aureus NEGATIVE NEGATIVE Final    Comment: (NOTE) The Xpert SA Assay (FDA approved for NASAL specimens in patients 62 years of age and older), is one component of a comprehensive surveillance program. It is not intended to diagnose infection nor to guide or monitor treatment. Performed at [facility], [address], [place], [place] [address]   Aerobic/Anaerobic Culture w Gram Stain (surgical/deep wound)     Status: None (Preliminary result)   Collection Time: 03/03/21  8:50 AM   Specimen: PATH Other; Tissue  Result Value Ref Range Status   Specimen  Description   Final    WOUND Performed at [facility], [address], [place], [place] [address]    Special Requests   Final    NONE Performed at [facility], [address] [place], [place] [address]    Gram Stain   Final    RARE WBC PRESENT,BOTH PMN AND MONONUCLEAR ABUNDANT GRAM POSITIVE COCCI FEW GRAM VARIABLE ROD Performed at [facility], [place] [address], [place], [place] [address]    Culture   Final     MODERATE ENTEROBACTER CLOACAE NO ANAEROBES ISOLATED; CULTURE IN PROGRESS FOR 5 DAYS    Report Status PENDING  Incomplete   Organism ID, Bacteria ENTEROBACTER CLOACAE  Final      Susceptibility   Enterobacter cloacae - MIC*    CEFAZOLIN >=64 RESISTANT Resistant     CEFEPIME <=0.12 SENSITIVE Sensitive     CEFTAZIDIME <=1 SENSITIVE Sensitive     CIPROFLOXACIN <=0.25 SENSITIVE Sensitive     GENTAMICIN <=1 SENSITIVE Sensitive     IMIPENEM <=0.25 SENSITIVE Sensitive     TRIMETH/SULFA <=20 SENSITIVE Sensitive     PIP/TAZO <=4 SENSITIVE Sensitive     * MODERATE ENTEROBACTER CLOACAE    Radiology Reports CT ABDOMEN PELVIS WO CONTRAST  Result Date: 02/28/2021 CLINICAL DATA:  Nonlocalized acute abdominal pain. EXAM: CT ABDOMEN AND PELVIS WITHOUT CONTRAST TECHNIQUE: Multidetector CT imaging of the abdomen and pelvis was performed following the standard protocol without IV contrast. COMPARISON:  Ultrasound pelvis 02/04/2018. FINDINGS: Lower chest: No acute abnormality. Hepatobiliary: No focal liver abnormality. Tiny calcified stones noted within the gallbladder lumen. No gallbladder wall thickening or pericholecystic fluid. No biliary dilatation. Pancreas: No focal lesion. Slight hazy pancreatic contour likely due to motion artifact. Otherwise normal pancreatic contour. No surrounding inflammatory changes. No main pancreatic ductal dilatation. Spleen: Normal in size without focal abnormality. A splenule is noted. Adrenals/Urinary Tract: No adrenal nodule bilaterally. Bilateral kidneys enhance symmetrically. No hydronephrosis. No hydroureter. The urinary bladder is unremarkable. Stomach/Bowel: Stomach is within normal limits. No evidence of bowel wall thickening or dilatation. Appendix appears normal. Vascular/Lymphatic: No abdominal aorta or iliac aneurysm. At least moderate atherosclerotic plaque of the aorta and its branches. No abdominal, pelvic, or inguinal lymphadenopathy. Reproductive: Redemonstration of  a coarsely calcified left subserosal fibroid measuring up to at least 5.5 cm. A central coarsely calcified or in lesion is also noted likely representing known submucosal uterine fibroid. Bilateral adnexa are otherwise unremarkable. Other: No intraperitoneal free fluid. No intraperitoneal free gas. No organized fluid collection. Musculoskeletal: No abdominal wall hernia or abnormality. No suspicious lytic or blastic osseous lesions. No acute displaced fracture. IMPRESSION: 1. Slight hazy pancreatic contour likely due to motion artifact. Differential diagnosis includes acute pancreatitis. Recommend correlation with lipase levels. 2. Cholelithiasis no CT findings of acute cholecystitis. 3. Subserosal and submucosal degenerative uterine fibroids. 4. Please note limited evaluation on this noncontrast study. Electronically Signed   By: Iven Finn M.D.   On: 02/28/2021 17:06   MR ABDOMEN MRCP WO CONTRAST  Result Date: 03/01/2021 CLINICAL DATA:  Abdominal pain.  Cholelithiasis. EXAM: MRI ABDOMEN WITHOUT CONTRAST  (INCLUDING MRCP) TECHNIQUE: Multiplanar multisequence MR imaging of the abdomen was performed. Heavily T2-weighted images of the biliary and pancreatic ducts were obtained, and three-dimensional MRCP images were rendered by post processing. COMPARISON:  CT abdomen 02/28/2021 FINDINGS: Despite efforts by the technologist and patient, motion artifact is present on today's exam and could not be eliminated. This reduces exam sensitivity and specificity. Lower chest: Unremarkable Hepatobiliary: The  small dependent gallstone in the gallbladder was better seen at CT. No biliary dilatation. No discrete filling defect in the common bile duct which measures 0.5 cm in diameter and which demonstrates normal conical tapering. No significant focal liver lesion identified. Pancreas:  Unremarkable Spleen:  Unremarkable Adrenals/Urinary Tract:  Both adrenal glands appear normal. 0.9 cm fluid density lesion of the left mid  kidney anteriorly on image 21 series 4, probably a cyst. Similar 0.7 cm lesion of the right kidney upper pole on image 25 series 5. Stomach/Bowel: Unremarkable Vascular/Lymphatic:  Aortoiliac atherosclerotic vascular disease. Other: Uterine fibroids are observed on the coronal T2 weighted images. Musculoskeletal: Unremarkable IMPRESSION: 1. No biliary dilatation or appreciable choledocholithiasis on today's examination. The patient has known cholelithiasis based on recent CT although previously seen gallstone is less conspicuous on today's MRI. 2. Small fluid density renal lesions favoring cysts. 3.  Aortic Atherosclerosis (ICD10-I70.0). 4. Uterine fibroids. Electronically Signed   By: Van Clines M.D.   On: 03/01/2021 10:47   MR 3D Recon At Scanner  Result Date: 03/02/2021 : Please see accession [id number] [facility] for MRCP report. Electronically Signed   By: Van Clines M.D.   On: 03/02/2021 08:06   US Venous Img Lower Unilateral Right (DVT)  Result Date: 03/04/2021 CLINICAL DATA:  Right lower extremity edema. Recent right fifth toe amputation. EXAM: RIGHT LOWER EXTREMITY VENOUS DOPPLER ULTRASOUND TECHNIQUE: Gray-scale sonography with graded compression, as well as color Doppler and duplex ultrasound were performed to evaluate the lower extremity deep venous systems from the level of the common femoral vein and including the common femoral, femoral, profunda femoral, popliteal and calf veins including the posterior tibial, peroneal and gastrocnemius veins when visible. The superficial great saphenous vein was also interrogated. Spectral Doppler was utilized to evaluate flow at rest and with distal augmentation maneuvers in the common femoral, femoral and popliteal veins. COMPARISON:  None. FINDINGS: Contralateral Common Femoral Vein: Respiratory phasicity is normal and symmetric with the symptomatic side. No evidence of thrombus. Normal compressibility. Common Femoral Vein: No evidence of thrombus.  Normal compressibility, respiratory phasicity and response to augmentation. Saphenofemoral Junction: No evidence of thrombus. Normal compressibility and flow on color Doppler imaging. Profunda Femoral Vein: No evidence of thrombus. Normal compressibility and flow on color Doppler imaging. Femoral Vein: No evidence of thrombus. Normal compressibility, respiratory phasicity and response to augmentation. Popliteal Vein: No evidence of thrombus. Normal compressibility, respiratory phasicity and response to augmentation. Calf Veins: No evidence of thrombus. Normal compressibility and flow on color Doppler imaging. Superficial Great Saphenous Vein: No evidence of thrombus. Normal compressibility. Venous Reflux:  None. Other Findings: No evidence of superficial thrombophlebitis or abnormal fluid collection. IMPRESSION: No evidence of right lower extremity deep venous thrombosis. Electronically Signed   By: Aletta Edouard M.D.   On: 03/04/2021 16:13   US ARTERIAL SEG MULTIPLE LE (ABI, SEGMENTAL PRESSURES, PVR'S)  Result Date: 03/02/2021 CLINICAL DATA:  Right toe gangrene EXAM: NONINVASIVE PHYSIOLOGIC VASCULAR STUDY OF BILATERAL LOWER EXTREMITIES TECHNIQUE: Non-invasive vascular evaluation of both lower extremities was performed at rest, including calculation of ankle-brachial indices, multiple segmental pressure evaluation, segmental Doppler and segmental pulse volume recording. COMPARISON:  None. FINDINGS: Right Lower Extremity Resting ABI:  0.73 Segmental Pressures: Significant pressure gradient between the upper and lower thigh cuff consistent with SFA disease. Additionally, pressure gradients are present between the calf and ankle cuff consistent with below the knee disease. Arterial Waveforms: Transition to abnormal monophasic arterial waveform in the popliteal artery consistent with SFA occlusive disease. PVRs:  Markedly blunted PVRs in the digits likely reflects severe small vessel disease. Left Lower Extremity:  Resting ABI: 0.91 Segmental Pressures: Pressure gradient between the lower thigh and calf cuffs consistent with distal femoropopliteal disease. Arterial Waveforms: Irregular arterial waveforms likely affected by artifact. PVRs: Digital waveforms are relatively preserved. Other: Symmetric upper extremity pressures. Ankle Brachial index > 1.4 Non diagnostic secondary to incompressible vessel calcifications 1.0-1.4       Normal 0.9-0.99     Borderline PAD 0.8-0.89     Mild PAD 0.5-0.79     Moderate PAD < 0.5          Severe PAD Toe Brachial Index Normal     >0.65 Moderate  0.53-0.64 Severe     <0.23 Toe Pressures Absolute toe pressure >43mHg sufficient for wound healing. Toe pressures <590mg = critical limb ischemia. IMPRESSION: 1. Abnormal resting right ankle-brachial index of 0.73 consistent with at least moderate peripheral arterial disease. 2. Segmental evaluation suggests outflow (superficial femoral) occlusive disease as well as runoff (below the knee) disease. Additionally, there is significant diminution of the arterial waveforms in the digits consistent with small vessel disease. 3. Resting left ankle-brachial index of 0.91 consistent with borderline peripheral arterial disease. 4. Segmental evaluation suggests an element of stenosis in the distal femoropopliteal segment on the left. Signed, HeCriselda PeachesMD, RPAtwoodascular and Interventional Radiology Specialists Gr[facility] Electronically Signed   By: HeJacqulynn Cadet.D.   On: 03/02/2021 12:08   DG Abd Acute W/Chest  Result Date: 02/28/2021 CLINICAL DATA:  Vomiting. EXAM: DG ABDOMEN ACUTE WITH 1 VIEW CHEST COMPARISON:  None. FINDINGS: There is no evidence of dilated bowel loops or free intraperitoneal air. Calcified uterine fibroid is noted in the pelvis. Heart size and mediastinal contours are within normal limits. Both lungs are clear. IMPRESSION: No abnormal bowel dilatation.  No acute cardiopulmonary disease. Electronically Signed    By: JaMarijo Conception.D.   On: 02/28/2021 13:39   DG Foot Complete Right  Result Date: 03/01/2021 CLINICAL DATA:  Swelling with yellow fluid EXAM: RIGHT FOOT COMPLETE - 3+ VIEW COMPARISON:  None. FINDINGS: No fracture or malalignment. Moderate gas within the dorsal soft tissues of the foot extending to the anterior ankle region concerning for gas forming/necrotizing infection. Possible ulceration of the fifth digit at the level of the IP joint. Possible small erosion at the head of the fifth proximal phalanx. IMPRESSION: 1. Soft tissue swelling. Gas within the soft tissues of the dorsum of the foot and anterior to the ankle raises concern for gas forming/necrotizing infection 2. Possible ulceration of the fifth digit. Suspect small cortical erosion at the head of the fifth proximal phalanx raising concern for osteomyelitis These results will be called to the ordering clinician or representative by the Radiologist Assistant, and communication documented in the PACS or ClFrontier Oil CorporationElectronically Signed   By: KiDonavan Foil.D.   On: 03/01/2021 17:00     CBC Recent Labs  Lab 02/28/21 1305 03/01/21 0624 03/02/21 0443 03/03/21 0538 03/04/21 0538 03/05/21 0444 03/06/21 0551  WBC 11.9* 10.1 11.3* 11.7* 11.7* 12.3* 12.8*  HGB 10.9* 9.3* 9.7* 9.3* 8.4* 9.3* 9.2*  HCT 34.6* 29.3* 31.1* 29.0* 26.5* 29.5* 29.2*  PLT 242 226 281 327 325 408* 486*  MCV 81.4 80.5 80.6 80.3 79.8* 81.0 80.4  MCH 25.6* 25.5* 25.1* 25.8* 25.3* 25.5* 25.3*  MCHC 31.5 31.7 31.2 32.1 31.7 31.5 31.5  RDW 13.9 14.0 14.1 14.6 15.0 15.5 15.8*  LYMPHSABS 1.1 1.2  --   --   --   --   --  MONOABS 0.9 0.7  --   --   --   --   --   EOSABS 0.0 0.1  --   --   --   --   --   BASOSABS 0.0 0.0  --   --   --   --   --     Chemistries  Recent Labs  Lab 03/02/21 0443 03/03/21 0538 03/04/21 0108 03/04/21 0538 03/05/21 0444 03/06/21 0551  NA 135 136  --  136 138 133*  K 3.2* 3.2*  --  3.0* 3.4* 4.1  CL 104 106  --  107 107  102  CO2 22 21*  --  20* 22 23  GLUCOSE 207* 158* 268* 260* 145* 212*  BUN 28* 23  --  _0 CREATININE 1.36* 1.41*  --  1.50* 1.42* 1.25*  CALCIUM 8.4* 8.3*  --  7.6* 8.2* 8.0*  MG  --   --   --  1.8  --   --   AST 138* 119*  --  100* 95* 100*  ALT 86* 94*  --  76* 77* 85*  ALKPHOS 139* 163*  --  218* 291* 353*  BILITOT 0.8 0.5  --  0.7 0.6 0.2*   ------------------------------------------------------------------------------------------------------------------ No results for input(s): CHOL, HDL, LDLCALC, TRIG, CHOLHDL, LDLDIRECT in the last 72 hours.  Lab Results  Component Value Date   HGBA1C 13.1 (H) 02/28/2021   ------------------------------------------------------------------------------------------------------------------ No results for input(s): TSH, T4TOTAL, T3FREE, THYROIDAB in the last 72 hours.  Invalid input(s): FREET3 ------------------------------------------------------------------------------------------------------------------ No results for input(s): VITAMINB12, FOLATE, FERRITIN, TIBC, IRON, RETICCTPCT in the last 72 hours.   Coagulation profile No results for input(s): INR, PROTIME in the last 168 hours.  No results for input(s): DDIMER in the last 72 hours.  Cardiac Enzymes No results for input(s): CKMB, TROPONINI, MYOGLOBIN in the last 168 hours.  Invalid input(s): CK ------------------------------------------------------------------------------------------------------------------ No results found for: BNP   Kathie Dike M.D on 03/06/2021 at 4:16 PM  Go to [email or web address] - for contact info  [facility] - Office  [phone]

## 2021-03-06 NOTE — Progress Notes (Signed)
[place] Hospital Day(s): 6.   Post op day(s): 3 Days Post-Op.   Interval History: Patient seen and examined, no acute events or new complaints overnight. Had another anxiety attack witnessed yesterday with associated n/v following pill swallowing.    Vital signs in last 24 hours: [min-max] current  Temp:  [97.8 F (36.6 C)-98.2 F (36.8 C)] 98.1 F (36.7 C) (07/10 0435) Pulse Rate:  [82-95] 82 (07/10 0435) Resp:  [18-20] 18 (07/10 0435) BP: (144-160)/(51-68) 145/55 (07/10 0435) SpO2:  [100 %] 100 % (07/10 0435) FiO2 (%):  [21 %] 21 % (07/09 1352) Weight:  [87.8 kg] 87.8 kg (07/10 0438)     Height: 5\' 8"  (172.7 cm) Weight: 87.8 kg BMI (Calculated): 29.43   Intake/Output last 2 shifts:  07/09 0701 - 07/10 0700 In: 600 [P.O.:400; IV Piggyback:200] Out: 600 [Urine:400; Emesis/NG output:200]   Physical Exam:  Constitutional: alert, cooperative and no distress  Respiratory: breathing non-labored at rest  Cardiovascular: regular rate and sinus rhythm  Gastrointestinal: soft, non-tender, and non-distended Integumentary: wound of right 5th toe amp site, clean and skin viable, deep soft tissues without necrosis, with some soupy exudate.          Labs:  CBC Latest Ref Rng & Units 03/06/2021 03/05/2021 03/04/2021  WBC 4.0 - 10.5 K/uL 12.8(H) 12.3(H) 11.7(H)  Hemoglobin 12.0 - 15.0 g/dL 9.2(L) 9.3(L) 8.4(L)  Hematocrit 36.0 - 46.0 % 29.2(L) 29.5(L) 26.5(L)  Platelets 150 - 400 K/uL 486(H) 408(H) 325   CMP Latest Ref Rng & Units 03/06/2021 03/05/2021 03/04/2021  Glucose 70 - 99 mg/dL 212(H) 145(H) 260(H)  BUN 8 - 23 mg/dL 18 19 22   Creatinine 0.44 - 1.00 mg/dL 1.25(H) 1.42(H) 1.50(H)  Sodium 135 - 145 mmol/L 133(L) 138 136  Potassium 3.5 - 5.1 mmol/L 4.1 3.4(L) 3.0(L)  Chloride 98 - 111 mmol/L 102 107 107  CO2 22 - 32 mmol/L 23 22 20(L)  Calcium 8.9 - 10.3 mg/dL 8.0(L) 8.2(L) 7.6(L)  Total Protein 6.5 - 8.1 g/dL 6.9 6.7 5.8(L)  Total  Bilirubin 0.3 - 1.2 mg/dL 0.2(L) 0.6 0.7  Alkaline Phos 38 - 126 U/L 353(H) 291(H) 218(H)  AST 15 - 41 U/L 100(H) 95(H) 100(H)  ALT 0 - 44 U/L 85(H) 77(H) 76(H)     Imaging studies: No new pertinent imaging studies   Assessment/Plan:  60 y.o. 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  - low/no fat diet in light of her gallstones.    -Continue daily dressing changes to ensure wound remains moist.  -Offloading and protection of operative site.  -Elevation of operative site.  All of the above findings and recommendations were discussed with the patient, and all of patient's questions were answered to her expressed satisfaction.  -- Ronny Bacon, M.D., [facility] 03/06/2021

## 2021-03-06 NOTE — Progress Notes (Signed)
Pharmacy Antibiotic Note  Nicole Golden is a 60 y.o. female admitted on 02/28/2021 with foot cellulitis/osteomyelitis. Pharmacy has been consulted for Cefepime dosing..  Plan: Continue cefepime 2000 mg IV every 12 hours. Monitor labs, c/s, and patient improvement.  Height: 5\' 8"  (172.7 cm) Weight: 87.8 kg (193 lb 8 oz) IBW/kg (Calculated) : 63.9  Temp (24hrs), Avg:98 F (36.7 C), Min:97.8 F (36.6 C), Max:98.2 F (36.8 C)  Recent Labs  Lab 03/02/21 0443 03/03/21 0538 03/04/21 0538 03/05/21 0444 03/06/21 0551  WBC 11.3* 11.7* 11.7* 12.3* 12.8*  CREATININE 1.36* 1.41* 1.50* 1.42* 1.25*     Estimated Creatinine Clearance: 54.8 mL/min (A) (by C-G formula based on SCr of 1.25 mg/dL (H)).    No Known Allergies  Antimicrobials this admission: Vanco 7/5 >>7/9 Cefepime 7/7 >> Flagyl 7/5 >> 7/7  7/5 Bcx: ngtd 7/5 Foot Abscess:  enterobacter cloacae- resistant to ancef 7/7 Wound Cx: gram + cocci Gram variable rod enterobacter  Thank you for allowing pharmacy to be a part of this patient's care.  Ramond Craver 03/06/2021 9:16 AM

## 2021-03-06 NOTE — Progress Notes (Signed)
During this shift, pt only had issues with vomiting when she was trying to swallow her pills. Pt would drink a half a cup of water with each pill. This RN offered applesauce to help with getting the pill down, however pt stated she would still know the pill was in the applesauce.   No PRN's were given, as pt stated as soon as she finished vomiting, she felt better.  Pt also had increased anxiety when she requested to get into bed. This RN suggested pt stay in chair if it caused her anxiety to get in bed. Pt agreed and remained in chair this shift.

## 2021-03-07 ENCOUNTER — Inpatient Hospital Stay (HOSPITAL_COMMUNITY): Payer: Medicare HMO

## 2021-03-07 LAB — COMPREHENSIVE METABOLIC PANEL
ALT: 86 U/L — ABNORMAL HIGH (ref 0–44)
AST: 89 U/L — ABNORMAL HIGH (ref 15–41)
Albumin: 2.3 g/dL — ABNORMAL LOW (ref 3.5–5.0)
Alkaline Phosphatase: 345 U/L — ABNORMAL HIGH (ref 38–126)
Anion gap: 8 (ref 5–15)
BUN: 21 mg/dL (ref 8–23)
CO2: 26 mmol/L (ref 22–32)
Calcium: 8.7 mg/dL — ABNORMAL LOW (ref 8.9–10.3)
Chloride: 105 mmol/L (ref 98–111)
Creatinine, Ser: 1.17 mg/dL — ABNORMAL HIGH (ref 0.44–1.00)
GFR, Estimated: 53 mL/min — ABNORMAL LOW (ref >60)
Glucose, Bld: 93 mg/dL (ref 70–99)
Potassium: 4.2 mmol/L (ref 3.5–5.1)
Sodium: 139 mmol/L (ref 135–145)
Total Bilirubin: 0.3 mg/dL (ref 0.3–1.2)
Total Protein: 6.9 g/dL (ref 6.5–8.1)

## 2021-03-07 LAB — GLUCOSE, CAPILLARY
Glucose-Capillary: 131 mg/dL — ABNORMAL HIGH (ref 70–99)
Glucose-Capillary: 171 mg/dL — ABNORMAL HIGH (ref 70–99)
Glucose-Capillary: 187 mg/dL — ABNORMAL HIGH (ref 70–99)
Glucose-Capillary: 245 mg/dL — ABNORMAL HIGH (ref 70–99)
Glucose-Capillary: 75 mg/dL (ref 70–99)
Glucose-Capillary: 78 mg/dL (ref 70–99)

## 2021-03-07 LAB — CBC
HCT: 29.2 % — ABNORMAL LOW (ref 36.0–46.0)
Hemoglobin: 9 g/dL — ABNORMAL LOW (ref 12.0–15.0)
MCH: 24.9 pg — ABNORMAL LOW (ref 26.0–34.0)
MCHC: 30.8 g/dL (ref 30.0–36.0)
MCV: 80.9 fL (ref 80.0–100.0)
Platelets: 465 10*3/uL — ABNORMAL HIGH (ref 150–400)
RBC: 3.61 MIL/uL — ABNORMAL LOW (ref 3.87–5.11)
RDW: 15.8 % — ABNORMAL HIGH (ref 11.5–15.5)
WBC: 13 10*3/uL — ABNORMAL HIGH (ref 4.0–10.5)
nRBC: 0 % (ref 0.0–0.2)

## 2021-03-07 LAB — SURGICAL PATHOLOGY

## 2021-03-07 IMAGING — MR MR FOOT*R* W/O CM
4 of 5 series · 30 of 40 positions shown · non-contrast
Comparison: Right foot x-rays dated [DATE].

CLINICAL DATA: Right foot infection. Recent fifth ray amputation
for osteomyelitis.

EXAM:
MRI OF THE RIGHT ANKLE WITHOUT CONTRAST
TECHNIQUE: Multiplanar, multisequence MR imaging of the right ankle was
performed. No intravenous contrast was administered.

[Series 3: PD fat-sat · axial · right · 3.0mm · 0.35mm/px · z∈[-44,+66]mm · 8 of 29 slices shown]
[im 1/29]
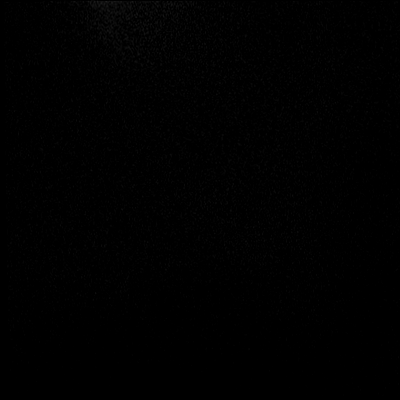
[im 4/29]
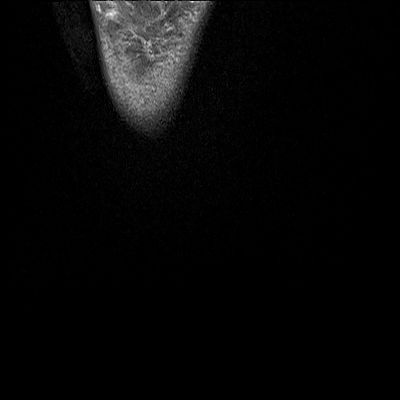
[im 10/29]
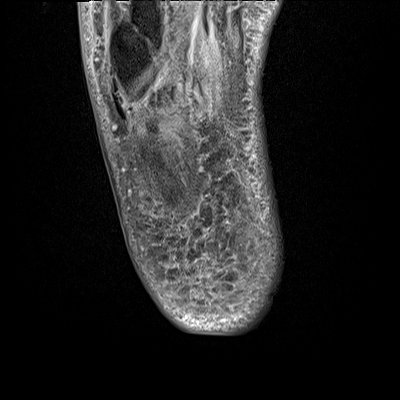
[im 13/29]
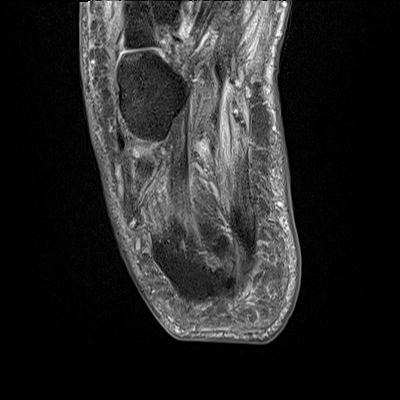
[im 16/29]
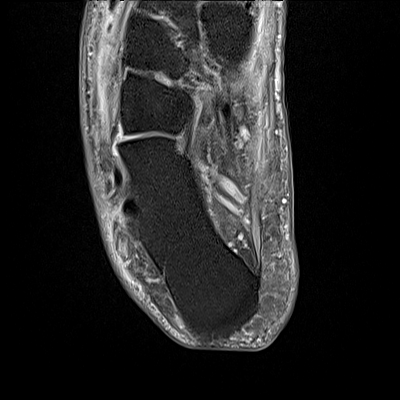
[im 19/29]
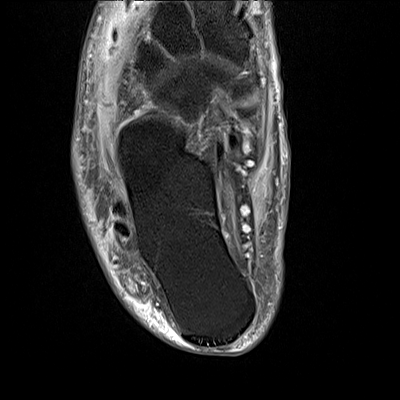
[im 25/29]
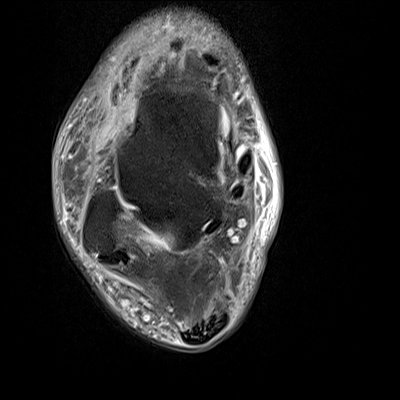
[im 29/29]
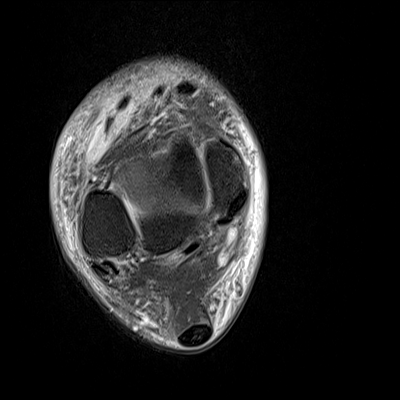

[Series 4: T2 fat-sat · axial · right · 3.0mm · 0.36mm/px · z∈[-36,+58]mm · 8 of 25 slices shown (1 of 2)]
[im 1/25]
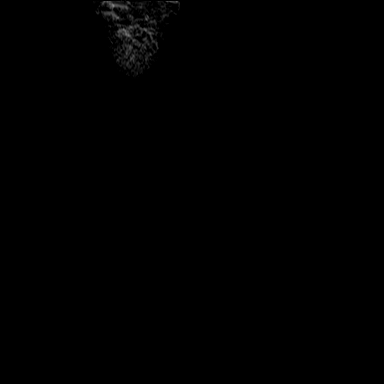
[im 4/25]
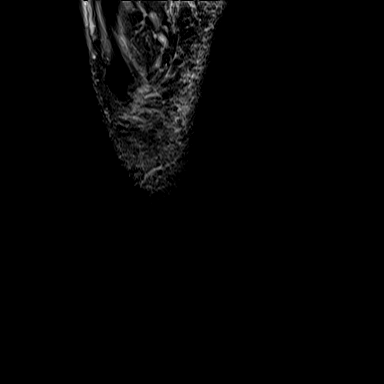
[im 7/25]
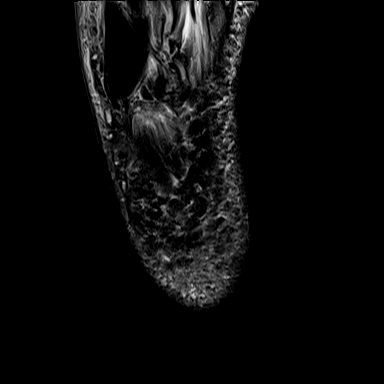
[im 11/25]
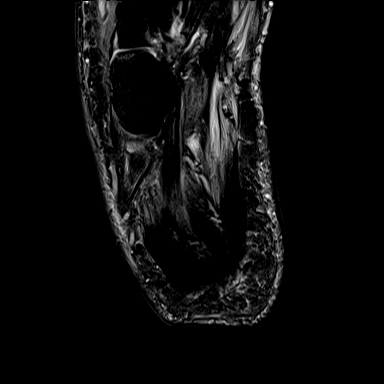
[im 14/25]
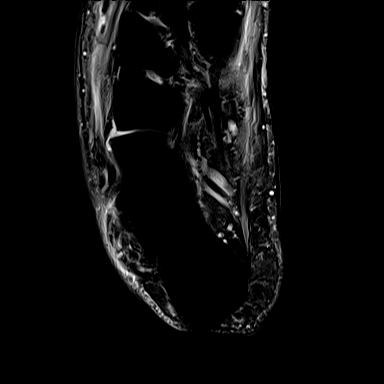
[im 18/25]
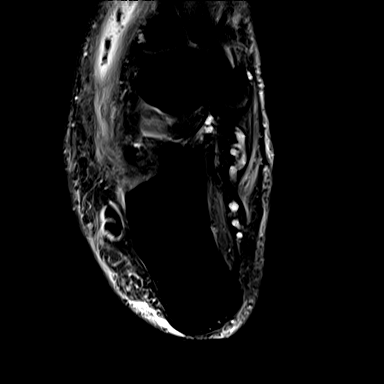
[im 21/25]
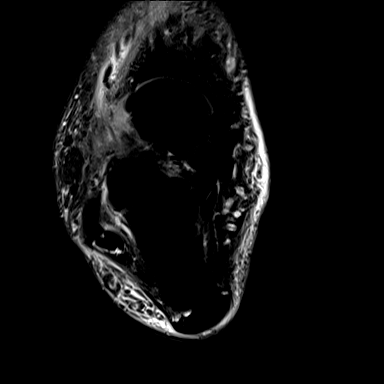
[im 25/25]
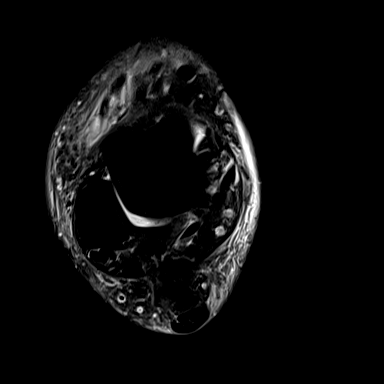

[Series 5: T2 fat-sat · coronal · right · 3.0mm · 0.36mm/px · 8 of 29 slices shown (2 of 2)]
[im 1/29]
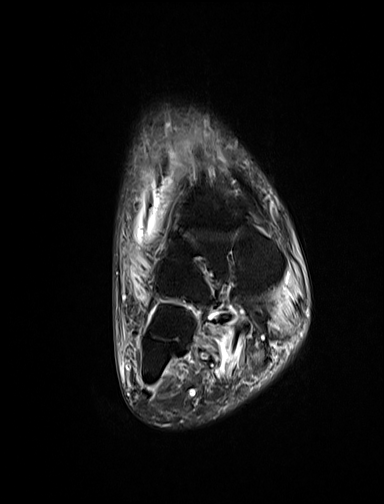
[im 4/29]
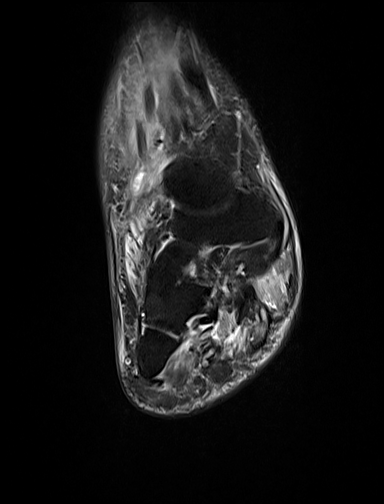
[im 10/29]
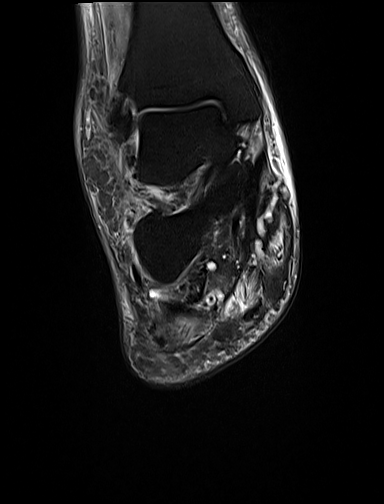
[im 13/29]
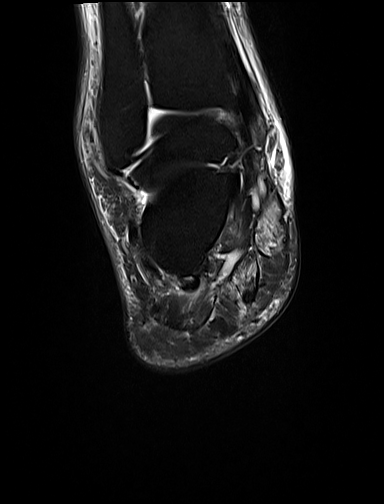
[im 16/29]
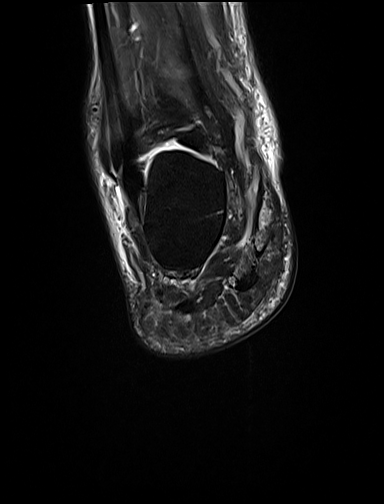
[im 19/29]
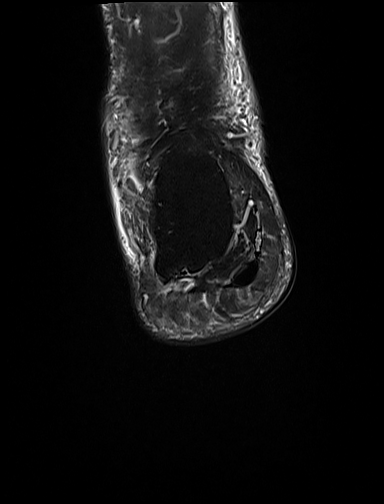
[im 25/29]
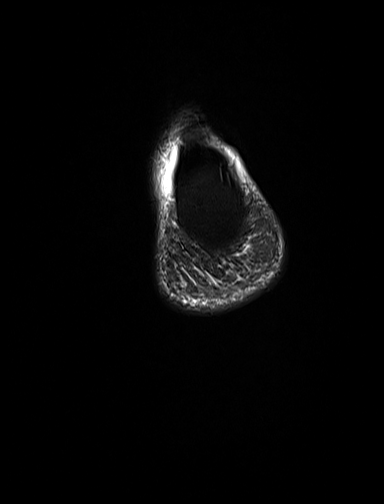
[im 29/29]
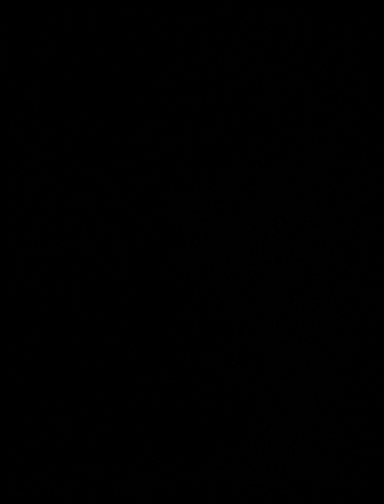

[Series 6: T1 · sagittal · right · 4.0mm · 0.35mm/px · 6 of 19 slices shown]
[im 1/19]
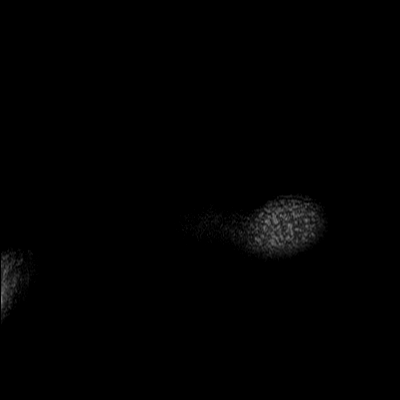
[im 4/19]
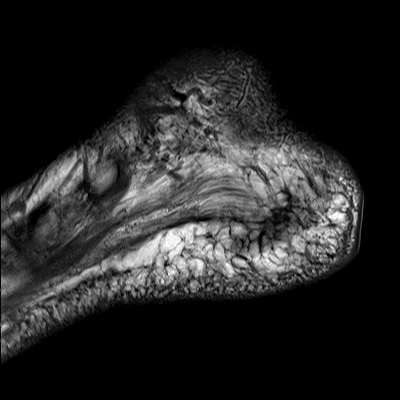
[im 8/19]
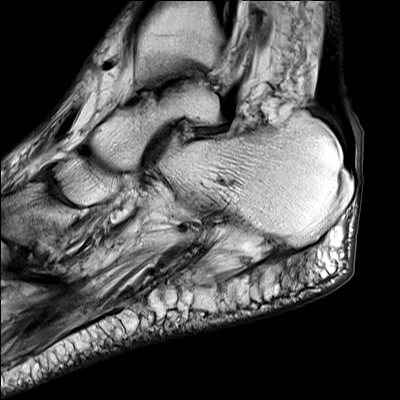
[im 11/19]
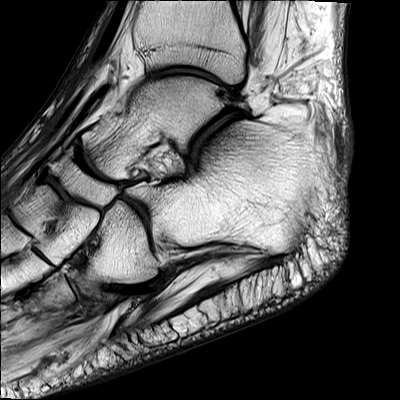
[im 15/19]
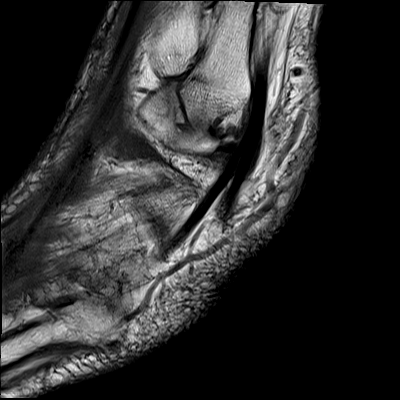
[im 19/19]
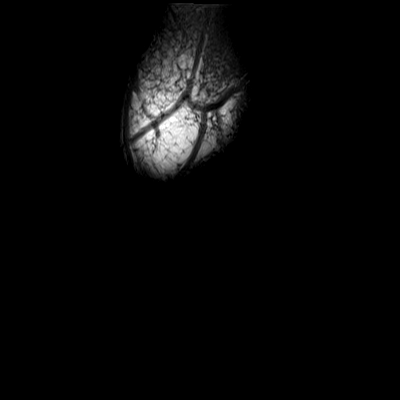

[30 of 40 positions shown; findings below may reference images not displayed]

FINDINGS: TENDONS

Peroneal: Peroneal longus tendon intact. Peroneal brevis intact.

Posteromedial: Posterior tibial tendon intact. Flexor digitorum
longus tendon intact. Flexor hallucis longus tendon intact.

Anterior: Tibialis anterior tendon intact. Extensor hallucis longus
tendon intact Extensor digitorum longus tendons intact with fluid
and gas in the proximal tendon sheaths (series 4, image 8; series 7,
images 5-6).

Achilles:  Intact with mild distal tendinosis.

Plantar Fascia: Intact. Thickening of the proximal central band.

LIGAMENTS

Lateral: Anterior talofibular ligament intact. Calcaneofibular
ligament intact. Posterior talofibular ligament intact. Anterior and
posterior tibiofibular ligaments intact.

Medial: Deltoid ligament intact. Spring ligament intact.

CARTILAGE

Ankle Joint: No joint effusion. Normal ankle mortise. No chondral
defect.

Subtalar Joints/Sinus Tarsi: Normal subtalar joints. No subtalar
joint effusion. Normal sinus tarsi.

Bones: No marrow signal abnormality.  No fracture or dislocation.

Soft Tissue: Bimalleolar soft tissue swelling. No soft tissue mass
or fluid collection.
IMPRESSION: 1. Extensor digitorum longus infectious tenosynovitis.
2. No evidence of osteomyelitis or septic arthritis.
3. Mild distal Achilles tendinosis.  Chronic mild plantar fasciitis.

## 2021-03-07 IMAGING — NM NM HEPATO W/GB/PHARM/[PERSON_NAME]
2 series · 12 of 12 positions shown · non-contrast
Comparison: None.

CLINICAL DATA: Cholelithiasis, nausea, vomiting

EXAM:
NUCLEAR MEDICINE HEPATOBILIARY IMAGING WITH GALLBLADDER EF
TECHNIQUE: Sequential images of the abdomen were obtained [DATE] minutes
following intravenous administration of radiopharmaceutical. After
slow intravenous infusion of 1.7 micrograms Cholecystokinin,
gallbladder ejection fraction was determined.
RADIOPHARMACEUTICALS:  5 mCi [CN] Choletec IV

[Series 1: biliary · 3.25mm/px · 6 of 60 frames shown]
[frame 6/60]
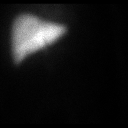
[frame 16/60]
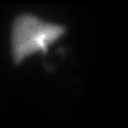
[frame 26/60]
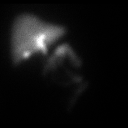
[frame 36/60]
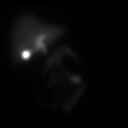
[frame 46/60]
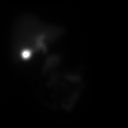
[frame 56/60]
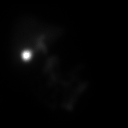

[Series 2: gbef · 3.25mm/px · 6 of 60 frames shown]
[frame 6/60]
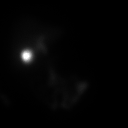
[frame 16/60]
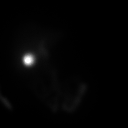
[frame 26/60]
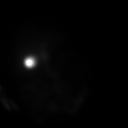
[frame 36/60]
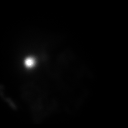
[frame 46/60]
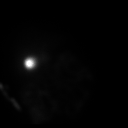
[frame 56/60]
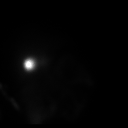

[12 of 12 positions shown; findings below may reference images not displayed]

FINDINGS: Normal tracer extraction from bloodstream indicating normal
hepatocellular function.

Normal excretion of tracer into biliary tree.

Gallbladder visualized at 27 min.

Small bowel visualized at 15 min.

No hepatic retention of tracer.

Subjectively decrease emptying of tracer from gallbladder following
CCK administration.

Calculated gallbladder ejection fraction is -20%, indicating
continued filling of the gallbladder during the post CCK period
without emptying.

Patient reported no symptoms following CCK administration.

Normal gallbladder ejection fraction following CCK stimulation is
greater than 40% at 1 hour.
IMPRESSION: Patent biliary tree.

No gallbladder response to CCK stimulation indicating abnormal
gallbladder function.

## 2021-03-07 MED ORDER — STERILE WATER FOR INJECTION IJ SOLN
INTRAMUSCULAR | Status: AC
  Administered 2021-03-07: 1.7 mL
  Filled 2021-03-07: qty 10

## 2021-03-07 MED ORDER — TECHNETIUM TC 99M MEBROFENIN IV KIT
5.0000 | PACK | Freq: Once | INTRAVENOUS | Status: AC | PRN
  Administered 2021-03-07: 5 via INTRAVENOUS

## 2021-03-07 MED ORDER — SINCALIDE 5 MCG IJ SOLR
INTRAMUSCULAR | Status: AC
  Administered 2021-03-07: 1.7 ug
  Filled 2021-03-07: qty 5

## 2021-03-07 NOTE — Progress Notes (Signed)
Patient Demographics:    Nicole Golden, is a 60 y.o. female, DOB - 05-Sep-1960, UXN:[id number]  Admit date - 02/28/2021   Admitting Physician [name], MD  Outpatient Primary MD for the patient is [name], DO  LOS - 7   No chief complaint on file.       Subjective:    Nicole Golden she did have some vomiting yesterday evening after dinner.  She also had a large bowel movement after enema.  Overall her abdomen is better.  She has not had any further vomiting today.  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 ---Rt   Gallstones   Gangrene of toe of right foot ([place])  Brief Summary:- 60 year old with past medical history relevant for uncontrolled DM with peripheral neuropathy, HTN, HLD, CKD 3B, and chronic right small toe wound admitted on 02/28/2021 with sepsis secondary to right foot cellulitis after presenting  to the ED with malaise, fatigue, fevers, nausea and vomiting for the last 4 days - A/p 1)severe Sepsis Secondary to Right Foot Cellulitis/Acute Osteomyelitis --POA--patient met sepsis criteria on admission with WBC of 11.9, fever of 102.7, tachycardia with heart rate of 110 tachypnea with respiratory rate of 22- she also had organ dysfunction with AKI -Plain films of right foot shows possible osteomyelitis of fifth digit concern for necrotizing infection -Seen by general surgery and underwent amputation of right fifth digit on 7/7 -Intraoperative cultures positive for Enterobacter -Continue cefepime -discussed with infectious disease and after reviewing pain films, there were  concern for more extensive infection that extended beyond toe -MRI of the foot did not show any evidence of osteomyelitis or persistent abscess/septic arthritis -We will plan on transitioning antibiotics to Bactrim on discharge -Patient with significant right foot peripheral neuropathy in the setting of uncontrolled DM--- despite probing the wound and obtaining wound cultures patient had No foot pain  2)Cholelithiasis without cholecystitis--- LFTs noted -CT abdomen and pelvis and MRCP noted, no biliary dilatation or choledocholithiasis -Discussed with general surgeon, no need for inpatient intervention at this time -Patient may benefit from outpatient consultation for possible elective lap chole  Hepatic Function Latest Ref Rng & Units 03/07/2021 03/06/2021 03/05/2021  Total Protein 6.5 - 8.1 g/dL 6.9 6.9 6.7  Albumin 3.5 - 5.0 g/dL 2.3(L) 2.2(L) 2.3(L)  AST 15 - 41 U/L 89(H) 100(H) 95(H)  ALT 0 - 44 U/L 86(H) 85(H) 77(H)  Alk Phosphatase 38 - 126 U/L 345(H) 353(H) 291(H)  Total Bilirubin 0.3 - 1.2 mg/dL 0.3 0.2(L) 0.6  Bilirubin, Direct 0.0 - 0.2 mg/dL - - -   3)Acute pancreatitis with nausea and vomiting and abdominal pain---- -=patient with H/o  idiopathic pancreatitis, -Lipase was up to 73, but has since normalized  no EtOH use  - Has nausea and vomiting----secondary to #1 and #2 above -Improved with IV fluids and IV antiemetics -Abdominal imaging studies as noted above in #2 without acute findings -Will need to follow-up with GI and general  surgery as an outpatient -LFTs currently elevated -No biliary obstruction noted on imaging  -Hold statin -Overall LFTs remain mildly elevated -HIDA scan showed decreased EF, but she did not have reproducible symptoms -We will monitor her oral intake and if she has any recurrence of vomiting -If patient does have persistent vomiting, can consider cholecystectomy prior to discharge  4)Aki on CKD stage - 3B -Creatinine noted to be elevated on  admission, up to 1.8, creatinine was 1.45 on 02/07/2021 --- renally adjust medications, avoid nephrotoxic agents / dehydration  / hypotension -She has been hydrated with IV fluids and creatinine has returned to baseline  5)HTN--currently on amlodipine -blood pressures stable -- hold losartan and HCTZ due to sepsis and AKI -May use IV labetalol as needed elevated BP  6)DM2--A1c previously greater than 12 reflecting uncontrolled DM with hyperglycemia Use Novolog/Humalog Sliding scale insulin with Accu-Cheks/Fingersticks as ordered  -Blood sugars are stable -continue lantus and novolog  7) chronic anemia most likely secondary to CKD--- hemoglobin is down to 9.7 from a baseline usually above 11 -- Patient also had some bleeding postoperatively which seems to have resolved now -Anemia work-up/labs including stool occult blood ordered and pending -May benefit from ESA/EPO agent intermittently -Continue to follow  8) lower extremity edema -Noted to have right lower extremity edema, possibly related to recent infection -Venous Dopplers negative for DVT -Appears to be improving with Lasix   Disposition/Need for in-Hospital Stay- patient unable to be discharged at this time due to sepsis secondary to right foot cellulitis and abscess in a diabetic patient requiring IV antibiotics and IVF  Status is: Inpatient  Remains inpatient appropriate because: See disposition above  Disposition: The patient is from: Home              Anticipated d/c is to: Home              Anticipated d/c date is: 3 days              Patient currently is not medically stable to d/c. Barriers: Not Clinically Stable-   Code Status : -  Code Status: Full Code   Family Communication:   (patient is alert, awake and coherent)  Consults  :  Gen surgery  DVT Prophylaxis  :   - SCDs  enoxaparin (LOVENOX) injection 40 mg Start: 03/04/21 0800 SCD's Start: 03/03/21 1944 Place and maintain sequential compression device  Start: 02/28/21 1850    Lab Results  Component Value Date   PLT 465 (H) 03/07/2021    Inpatient Medications  Scheduled Meds:  amLODipine  10 mg Oral Daily   aspirin EC  81 mg Oral Q breakfast   enoxaparin (LOVENOX) injection  40 mg Subcutaneous Q24H   furosemide  20 mg Oral Daily   insulin aspart  0-9 Units Subcutaneous Q4H   insulin aspart  10 Units Subcutaneous TID WC   insulin glargine  70 Units Subcutaneous QHS   metoCLOPramide  5 mg Oral TID AC   polyethylene glycol  17 g Oral BID   Continuous Infusions:  ceFEPime (MAXIPIME) IV 2 g (03/07/21 1218)   PRN Meds:.acetaminophen, hydrALAZINE, HYDROcodone-acetaminophen, labetalol, ondansetron **OR** ondansetron (ZOFRAN) IV    Anti-infectives (From admission, onward)    Start     Dose/Rate Route Frequency Ordered Stop   03/04/21 0800  ceFEPIme (MAXIPIME) 2 g in sodium chloride 0.9 % 100 mL IVPB        2 g 200 mL/hr over 30 Minutes Intravenous Every 12 hours 03/03/21  1840     03/03/21 1930  ceFEPIme (MAXIPIME) 2 g in sodium chloride 0.9 % 100 mL IVPB        2 g 200 mL/hr over 30 Minutes Intravenous  Once 03/03/21 1833 03/03/21 1952   03/02/21 1800  vancomycin (VANCOCIN) IVPB 1000 mg/200 mL premix  Status:  Discontinued        1,000 mg 200 mL/hr over 60 Minutes Intravenous Every 24 hours 03/01/21 1630 03/02/21 0852   03/02/21 1800  vancomycin (VANCOREADY) IVPB 1250 mg/250 mL  Status:  Discontinued        1,250 mg 166.7 mL/hr over 90 Minutes Intravenous Every 24 hours 03/02/21 0852 03/05/21 1812   03/01/21 1800  metroNIDAZOLE (FLAGYL) IVPB 500 mg  Status:  Discontinued        500 mg 100 mL/hr over 60 Minutes Intravenous Every 8 hours 03/01/21 1647 03/03/21 1833   03/01/21 1730  vancomycin (VANCOREADY) IVPB 1500 mg/300 mL        1,500 mg 150 mL/hr over 120 Minutes Intravenous  Once 03/01/21 1624 03/01/21 1856   03/01/21 1715  vancomycin (VANCOCIN) IVPB 1000 mg/200 mL premix  Status:  Discontinued        1,000 mg 200 mL/hr  over 60 Minutes Intravenous  Once 03/01/21 1622 03/01/21 1623         Objective:   Vitals:   03/07/21 0436 03/07/21 0453 03/07/21 0815 03/07/21 1350  BP: (!) 160/70  (!) 136/108 136/86  Pulse: 86  83 80  Resp: _0 Temp: 98.3 F (36.8 C)  98 F (36.7 C) 98.2 F (36.8 C)  TempSrc: Oral  Oral Oral  SpO2: 100%  100% 100%  Weight:  86.4 kg    Height:        Wt Readings from Last 3 Encounters:  03/07/21 86.4 kg  02/12/21 86.2 kg  02/07/21 86.2 kg     Intake/Output Summary (Last 24 hours) at 03/07/2021 1728 Last data filed at 03/07/2021 0500 Gross per 24 hour  Intake 680 ml  Output --  Net 680 ml     Physical Exam  General exam: Alert, awake, oriented x 3 Respiratory system: Clear to auscultation. Respiratory effort normal. Cardiovascular system:RRR. No murmurs, rubs, gallops. Gastrointestinal system: Abdomen is nondistended, soft and nontender. No organomegaly or masses felt. Normal bowel sounds heard. Central nervous system: Alert and oriented. No focal neurological deficits. Extremities: right foot with dressing in place, RLE edema is better Skin: No rashes, lesions or ulcers Psychiatry: Judgement and insight appear normal. Mood & affect appropriate.       Data Review:   Micro Results Recent Results (from the past 240 hour(s))  SARS CORONAVIRUS 2 (TAT 6-24 HRS) Nasopharyngeal Nasopharyngeal Swab     Status: None   Collection Time: 02/28/21  8:58 PM   Specimen: Nasopharyngeal Swab  Result Value Ref Range Status   SARS Coronavirus 2 NEGATIVE NEGATIVE Final    Comment: (NOTE) SARS-CoV-2 target nucleic acids are NOT DETECTED.  The SARS-CoV-2 RNA is generally detectable in upper and lower respiratory specimens during the acute phase of infection. Negative results do not preclude SARS-CoV-2 infection, do not rule out co-infections with other pathogens, and should not be used as the sole basis for treatment or other patient management decisions. Negative  results must be combined with clinical observations, patient history, and epidemiological information. The expected result is Negative.  Fact Sheet for Patients: SugarRoll.be  Fact Sheet for Healthcare Providers: https://www.woods-mathews.com/  This test is  not yet approved or cleared by the [place] and  has been authorized for detection and/or diagnosis of SARS-CoV-2 by FDA under an Emergency Use Authorization (EUA). This EUA will remain  in effect (meaning this test can be used) for the duration of the COVID-19 declaration under Se ction 564(b)(1) of the Act, 21 U.S.C. section 360bbb-3(b)(1), unless the authorization is terminated or revoked sooner.  Performed at [facility], [place] [address], [place], [place] [address]   Aerobic/Anaerobic Culture w Gram Stain (surgical/deep wound)     Status: None   Collection Time: 03/01/21  4:20 PM   Specimen: Foot; Abscess  Result Value Ref Range Status   Specimen Description   Final    FOOT Performed at [facility], [address] [place], [place] [address]    Special Requests   Final    NONE Performed at [facility], [address], [place], [place] [address]    Gram Stain   Final    RARE WBC PRESENT, PREDOMINANTLY PMN ABUNDANT GRAM POSITIVE COCCI    Culture   Final    ABUNDANT ENTEROBACTER CLOACAE WITHIN MIXED ORGANISMS NO ANAEROBES ISOLATED Performed at [facility], [address], [place], [place] [address]    Report Status 03/06/2021 FINAL  Final   Organism ID, Bacteria ENTEROBACTER CLOACAE  Final      Susceptibility   Enterobacter cloacae - MIC*    CEFAZOLIN >=64 RESISTANT Resistant     CEFEPIME <=0.12 SENSITIVE Sensitive     CEFTAZIDIME <=1 SENSITIVE Sensitive     CIPROFLOXACIN <=0.25 SENSITIVE Sensitive     GENTAMICIN <=1 SENSITIVE Sensitive     IMIPENEM <=0.25 SENSITIVE Sensitive     TRIMETH/SULFA <=20 SENSITIVE Sensitive     PIP/TAZO <=4 SENSITIVE  Sensitive     * ABUNDANT ENTEROBACTER CLOACAE  Culture, blood (Routine X 2) w Reflex to ID Panel     Status: None   Collection Time: 03/01/21  5:57 PM   Specimen: BLOOD RIGHT ARM  Result Value Ref Range Status   Specimen Description BLOOD RIGHT ARM  Final   Special Requests   Final    BOTTLES DRAWN AEROBIC AND ANAEROBIC Blood Culture adequate volume   Culture   Final    NO GROWTH 5 DAYS Performed at [facility], [address], [place], [place] [address]    Report Status 03/06/2021 FINAL  Final  Culture, blood (Routine X 2) w Reflex to ID Panel     Status: None   Collection Time: 03/01/21  5:57 PM   Specimen: BLOOD LEFT HAND  Result Value Ref Range Status   Specimen Description BLOOD LEFT HAND  Final   Special Requests   Final    BOTTLES DRAWN AEROBIC AND ANAEROBIC Blood Culture adequate volume   Culture   Final    NO GROWTH 5 DAYS Performed at [facility], [address], [place], [place] [address]    Report Status 03/06/2021 FINAL  Final  Surgical pcr screen     Status: None   Collection Time: 03/03/21  5:03 AM   Specimen: Nasal Mucosa; Nasal Swab  Result Value Ref Range Status   MRSA, PCR NEGATIVE NEGATIVE Final   Staphylococcus aureus NEGATIVE NEGATIVE Final    Comment: (NOTE) The Xpert SA Assay (FDA approved for NASAL specimens in patients 33 years of age and older), is one component of a comprehensive surveillance program. It is not intended to diagnose infection nor to guide or monitor treatment. Performed  at [facility], [address], [place], [place] [address]   Aerobic/Anaerobic Culture w Gram Stain (surgical/deep wound)     Status: None (Preliminary result)   Collection Time: 03/03/21  8:50 AM   Specimen: PATH Other; Tissue  Result Value Ref Range Status   Specimen Description   Final    WOUND Performed at [facility], [address] [place], [place] [address]    Special Requests   Final    NONE Performed at [facility], [address], [place],  [place] [address]    Gram Stain   Final    RARE WBC PRESENT,BOTH PMN AND MONONUCLEAR ABUNDANT GRAM POSITIVE COCCI FEW GRAM VARIABLE ROD Performed at [facility], [place] [address], [place], [place] [address]    Culture   Final    MODERATE ENTEROBACTER CLOACAE NO ANAEROBES ISOLATED; CULTURE IN PROGRESS FOR 5 DAYS    Report Status PENDING  Incomplete   Organism ID, Bacteria ENTEROBACTER CLOACAE  Final      Susceptibility   Enterobacter cloacae - MIC*    CEFAZOLIN >=64 RESISTANT Resistant     CEFEPIME <=0.12 SENSITIVE Sensitive     CEFTAZIDIME <=1 SENSITIVE Sensitive     CIPROFLOXACIN <=0.25 SENSITIVE Sensitive     GENTAMICIN <=1 SENSITIVE Sensitive     IMIPENEM <=0.25 SENSITIVE Sensitive     TRIMETH/SULFA <=20 SENSITIVE Sensitive     PIP/TAZO <=4 SENSITIVE Sensitive     * MODERATE ENTEROBACTER CLOACAE    Radiology Reports CT ABDOMEN PELVIS WO CONTRAST  Result Date: 02/28/2021 CLINICAL DATA:  Nonlocalized acute abdominal pain. EXAM: CT ABDOMEN AND PELVIS WITHOUT CONTRAST TECHNIQUE: Multidetector CT imaging of the abdomen and pelvis was performed following the standard protocol without IV contrast. COMPARISON:  Ultrasound pelvis 02/04/2018. FINDINGS: Lower chest: No acute abnormality. Hepatobiliary: No focal liver abnormality. Tiny calcified stones noted within the gallbladder lumen. No gallbladder wall thickening or pericholecystic fluid. No biliary dilatation. Pancreas: No focal lesion. Slight hazy pancreatic contour likely due to motion artifact. Otherwise normal pancreatic contour. No surrounding inflammatory changes. No main pancreatic ductal dilatation. Spleen: Normal in size without focal abnormality. A splenule is noted. Adrenals/Urinary Tract: No adrenal nodule bilaterally. Bilateral kidneys enhance symmetrically. No hydronephrosis. No hydroureter. The urinary bladder is unremarkable. Stomach/Bowel: Stomach is within normal limits. No evidence of bowel wall thickening or dilatation.  Appendix appears normal. Vascular/Lymphatic: No abdominal aorta or iliac aneurysm. At least moderate atherosclerotic plaque of the aorta and its branches. No abdominal, pelvic, or inguinal lymphadenopathy. Reproductive: Redemonstration of a coarsely calcified left subserosal fibroid measuring up to at least 5.5 cm. A central coarsely calcified or in lesion is also noted likely representing known submucosal uterine fibroid. Bilateral adnexa are otherwise unremarkable. Other: No intraperitoneal free fluid. No intraperitoneal free gas. No organized fluid collection. Musculoskeletal: No abdominal wall hernia or abnormality. No suspicious lytic or blastic osseous lesions. No acute displaced fracture. IMPRESSION: 1. Slight hazy pancreatic contour likely due to motion artifact. Differential diagnosis includes acute pancreatitis. Recommend correlation with lipase levels. 2. Cholelithiasis no CT findings of acute cholecystitis. 3. Subserosal and submucosal degenerative uterine fibroids. 4. Please note limited evaluation on this noncontrast study. Electronically Signed   By: Iven Finn M.D.   On: 02/28/2021 17:06   MR FOOT RIGHT WO CONTRAST  Result Date: 03/07/2021 CLINICAL DATA:  Right foot infection. Recent fifth ray amputation for osteomyelitis. EXAM: MRI OF THE RIGHT ANKLE WITHOUT CONTRAST TECHNIQUE: Multiplanar, multisequence MR imaging of the right ankle was performed.  No intravenous contrast was administered. COMPARISON:  Right foot x-rays dated March 01, 2021. FINDINGS: TENDONS Peroneal: Peroneal longus tendon intact. Peroneal brevis intact. Posteromedial: Posterior tibial tendon intact. Flexor digitorum longus tendon intact. Flexor hallucis longus tendon intact. Anterior: Tibialis anterior tendon intact. Extensor hallucis longus tendon intact Extensor digitorum longus tendons intact with fluid and gas in the proximal tendon sheaths (series 4, image 8; series 7, images 5-6). Achilles:  Intact with mild distal  tendinosis. Plantar Fascia: Intact. Thickening of the proximal central band. LIGAMENTS Lateral: Anterior talofibular ligament intact. Calcaneofibular ligament intact. Posterior talofibular ligament intact. Anterior and posterior tibiofibular ligaments intact. Medial: Deltoid ligament intact. Spring ligament intact. CARTILAGE Ankle Joint: No joint effusion. Normal ankle mortise. No chondral defect. Subtalar Joints/Sinus Tarsi: Normal subtalar joints. No subtalar joint effusion. Normal sinus tarsi. Bones: No marrow signal abnormality.  No fracture or dislocation. Soft Tissue: Bimalleolar soft tissue swelling. No soft tissue mass or fluid collection. IMPRESSION: 1. Extensor digitorum longus infectious tenosynovitis. 2. No evidence of osteomyelitis or septic arthritis. 3. Mild distal Achilles tendinosis.  Chronic mild plantar fasciitis. Electronically Signed   By: Titus Dubin M.D.   On: 03/07/2021 12:43   MR ABDOMEN MRCP WO CONTRAST  Result Date: 03/01/2021 CLINICAL DATA:  Abdominal pain.  Cholelithiasis. EXAM: MRI ABDOMEN WITHOUT CONTRAST  (INCLUDING MRCP) TECHNIQUE: Multiplanar multisequence MR imaging of the abdomen was performed. Heavily T2-weighted images of the biliary and pancreatic ducts were obtained, and three-dimensional MRCP images were rendered by post processing. COMPARISON:  CT abdomen 02/28/2021 FINDINGS: Despite efforts by the technologist and patient, motion artifact is present on today's exam and could not be eliminated. This reduces exam sensitivity and specificity. Lower chest: Unremarkable Hepatobiliary: The small dependent gallstone in the gallbladder was better seen at CT. No biliary dilatation. No discrete filling defect in the common bile duct which measures 0.5 cm in diameter and which demonstrates normal conical tapering. No significant focal liver lesion identified. Pancreas:  Unremarkable Spleen:  Unremarkable Adrenals/Urinary Tract:  Both adrenal glands appear normal. 0.9 cm fluid  density lesion of the left mid kidney anteriorly on image 21 series 4, probably a cyst. Similar 0.7 cm lesion of the right kidney upper pole on image 25 series 5. Stomach/Bowel: Unremarkable Vascular/Lymphatic:  Aortoiliac atherosclerotic vascular disease. Other: Uterine fibroids are observed on the coronal T2 weighted images. Musculoskeletal: Unremarkable IMPRESSION: 1. No biliary dilatation or appreciable choledocholithiasis on today's examination. The patient has known cholelithiasis based on recent CT although previously seen gallstone is less conspicuous on today's MRI. 2. Small fluid density renal lesions favoring cysts. 3.  Aortic Atherosclerosis (ICD10-I70.0). 4. Uterine fibroids. Electronically Signed   By: Van Clines M.D.   On: 03/01/2021 10:47   MR 3D Recon At Scanner  Result Date: 03/02/2021 : Please see accession [id number] [facility] for MRCP report. Electronically Signed   By: Van Clines M.D.   On: 03/02/2021 08:06   NM Hepato W/EF  Result Date: 03/07/2021 CLINICAL DATA:  Cholelithiasis, nausea, vomiting EXAM: NUCLEAR MEDICINE HEPATOBILIARY IMAGING WITH GALLBLADDER EF TECHNIQUE: Sequential images of the abdomen were obtained out to 60 minutes following intravenous administration of radiopharmaceutical. After slow intravenous infusion of 1.7 micrograms Cholecystokinin, gallbladder ejection fraction was determined. RADIOPHARMACEUTICALS:  5 mCi Tc-32mCholetec IV COMPARISON:  None. FINDINGS: Normal tracer extraction from bloodstream indicating normal hepatocellular function. Normal excretion of tracer into biliary tree. Gallbladder visualized at 27 min. Small bowel visualized at 15 min. No hepatic retention of tracer. Subjectively decrease emptying of tracer from gallbladder  following CCK administration. Calculated gallbladder ejection fraction is -20%, indicating continued filling of the gallbladder during the post CCK period without emptying. Patient reported no symptoms following CCK  administration. Normal gallbladder ejection fraction following CCK stimulation is greater than 40% at 1 hour. IMPRESSION: Patent biliary tree. No gallbladder response to CCK stimulation indicating abnormal gallbladder function. Electronically Signed   By: Lavonia Dana M.D.   On: 03/07/2021 12:55   US Venous Img Lower Unilateral Right (DVT)  Result Date: 03/04/2021 CLINICAL DATA:  Right lower extremity edema. Recent right fifth toe amputation. EXAM: RIGHT LOWER EXTREMITY VENOUS DOPPLER ULTRASOUND TECHNIQUE: Gray-scale sonography with graded compression, as well as color Doppler and duplex ultrasound were performed to evaluate the lower extremity deep venous systems from the level of the common femoral vein and including the common femoral, femoral, profunda femoral, popliteal and calf veins including the posterior tibial, peroneal and gastrocnemius veins when visible. The superficial great saphenous vein was also interrogated. Spectral Doppler was utilized to evaluate flow at rest and with distal augmentation maneuvers in the common femoral, femoral and popliteal veins. COMPARISON:  None. FINDINGS: Contralateral Common Femoral Vein: Respiratory phasicity is normal and symmetric with the symptomatic side. No evidence of thrombus. Normal compressibility. Common Femoral Vein: No evidence of thrombus. Normal compressibility, respiratory phasicity and response to augmentation. Saphenofemoral Junction: No evidence of thrombus. Normal compressibility and flow on color Doppler imaging. Profunda Femoral Vein: No evidence of thrombus. Normal compressibility and flow on color Doppler imaging. Femoral Vein: No evidence of thrombus. Normal compressibility, respiratory phasicity and response to augmentation. Popliteal Vein: No evidence of thrombus. Normal compressibility, respiratory phasicity and response to augmentation. Calf Veins: No evidence of thrombus. Normal compressibility and flow on color Doppler imaging. Superficial  Great Saphenous Vein: No evidence of thrombus. Normal compressibility. Venous Reflux:  None. Other Findings: No evidence of superficial thrombophlebitis or abnormal fluid collection. IMPRESSION: No evidence of right lower extremity deep venous thrombosis. Electronically Signed   By: Aletta Edouard M.D.   On: 03/04/2021 16:13   US ARTERIAL SEG MULTIPLE LE (ABI, SEGMENTAL PRESSURES, PVR'S)  Result Date: 03/02/2021 CLINICAL DATA:  Right toe gangrene EXAM: NONINVASIVE PHYSIOLOGIC VASCULAR STUDY OF BILATERAL LOWER EXTREMITIES TECHNIQUE: Non-invasive vascular evaluation of both lower extremities was performed at rest, including calculation of ankle-brachial indices, multiple segmental pressure evaluation, segmental Doppler and segmental pulse volume recording. COMPARISON:  None. FINDINGS: Right Lower Extremity Resting ABI:  0.73 Segmental Pressures: Significant pressure gradient between the upper and lower thigh cuff consistent with SFA disease. Additionally, pressure gradients are present between the calf and ankle cuff consistent with below the knee disease. Arterial Waveforms: Transition to abnormal monophasic arterial waveform in the popliteal artery consistent with SFA occlusive disease. PVRs: Markedly blunted PVRs in the digits likely reflects severe small vessel disease. Left Lower Extremity: Resting ABI: 0.91 Segmental Pressures: Pressure gradient between the lower thigh and calf cuffs consistent with distal femoropopliteal disease. Arterial Waveforms: Irregular arterial waveforms likely affected by artifact. PVRs: Digital waveforms are relatively preserved. Other: Symmetric upper extremity pressures. Ankle Brachial index > 1.4 Non diagnostic secondary to incompressible vessel calcifications 1.0-1.4       Normal 0.9-0.99     Borderline PAD 0.8-0.89     Mild PAD 0.5-0.79     Moderate PAD < 0.5          Severe PAD Toe Brachial Index Normal     >0.65 Moderate  0.53-0.64 Severe     <0.23 Toe Pressures Absolute toe  pressure >  78mHg sufficient for wound healing. Toe pressures <574mg = critical limb ischemia. IMPRESSION: 1. Abnormal resting right ankle-brachial index of 0.73 consistent with at least moderate peripheral arterial disease. 2. Segmental evaluation suggests outflow (superficial femoral) occlusive disease as well as runoff (below the knee) disease. Additionally, there is significant diminution of the arterial waveforms in the digits consistent with small vessel disease. 3. Resting left ankle-brachial index of 0.91 consistent with borderline peripheral arterial disease. 4. Segmental evaluation suggests an element of stenosis in the distal femoropopliteal segment on the left. Signed, HeCriselda PeachesMD, RPSullivan Cityascular and Interventional [facility]ists GrLangle[facility] Instituteadiology Electronically Signed   By: HeJacqulynn Cadet.D.   On: 03/02/2021 12:08   DG Abd Acute W/Chest  Result Date: 02/28/2021 CLINICAL DATA:  Vomiting. EXAM: DG ABDOMEN ACUTE WITH 1 VIEW CHEST COMPARISON:  None. FINDINGS: There is no evidence of dilated bowel loops or free intraperitoneal air. Calcified uterine fibroid is noted in the pelvis. Heart size and mediastinal contours are within normal limits. Both lungs are clear. IMPRESSION: No abnormal bowel dilatation.  No acute cardiopulmonary disease. Electronically Signed   By: JaMarijo Conception.D.   On: 02/28/2021 13:39   DG Foot Complete Right  Result Date: 03/01/2021 CLINICAL DATA:  Swelling with yellow fluid EXAM: RIGHT FOOT COMPLETE - 3+ VIEW COMPARISON:  None. FINDINGS: No fracture or malalignment. Moderate gas within the dorsal soft tissues of the foot extending to the anterior ankle region concerning for gas forming/necrotizing infection. Possible ulceration of the fifth digit at the level of the IP joint. Possible small erosion at the head of the fifth proximal phalanx. IMPRESSION: 1. Soft tissue swelling. Gas within the soft tissues of the dorsum of the foot and anterior to the  ankle raises concern for gas forming/necrotizing infection 2. Possible ulceration of the fifth digit. Suspect small cortical erosion at the head of the fifth proximal phalanx raising concern for osteomyelitis These results will be called to the ordering clinician or representative by the Radiologist Assistant, and communication documented in the PACS or ClFrontier Oil CorporationElectronically Signed   By: KiDonavan Foil.D.   On: 03/01/2021 17:00     CBC Recent Labs  Lab 03/01/21 [id number] 03/03/21 0538 03/04/21 0538 03/05/21 0444 03/06/21 0551 03/07/21 0606  WBC 10.1   < > 11.7* 11.7* 12.3* 12.8* 13.0*  HGB 9.3*   < > 9.3* 8.4* 9.3* 9.2* 9.0*  HCT 29.3*   < > 29.0* 26.5* 29.5* 29.2* 29.2*  PLT 226   < > 327 325 408* 486* 465*  MCV 80.5   < > 80.3 79.8* 81.0 80.4 80.9  MCH 25.5*   < > 25.8* 25.3* 25.5* 25.3* 24.9*  MCHC 31.7   < > 32.1 31.7 31.5 31.5 30.8  RDW 14.0   < > 14.6 15.0 15.5 15.8* 15.8*  LYMPHSABS 1.2  --   --   --   --   --   --   MONOABS 0.7  --   --   --   --   --   --   EOSABS 0.1  --   --   --   --   --   --   BASOSABS 0.0  --   --   --   --   --   --    < > = values in this interval not displayed.    ChMarlinLab 03/03/21 [id number] 03/04/21 0538 03/05/21 0444  03/06/21 0551 03/07/21 0606  NA 136  --  136 138 133* 139  K 3.2*  --  3.0* 3.4* 4.1 4.2  CL 106  --  107 107 102 105  CO2 21*  --  20* _0 GLUCOSE 158* 268* 260* 145* 212* 93  BUN 23  --  _1 CREATININE 1.41*  --  1.50* 1.42* 1.25* 1.17*  CALCIUM 8.3*  --  7.6* 8.2* 8.0* 8.7*  MG  --   --  1.8  --   --   --   AST 119*  --  100* 95* 100* 89*  ALT 94*  --  76* 77* 85* 86*  ALKPHOS 163*  --  218* 291* 353* 345*  BILITOT 0.5  --  0.7 0.6 0.2* 0.3   ------------------------------------------------------------------------------------------------------------------ No results for input(s): CHOL, HDL, LDLCALC, TRIG, CHOLHDL, LDLDIRECT in the last 72  hours.  Lab Results  Component Value Date   HGBA1C 13.1 (H) 02/28/2021   ------------------------------------------------------------------------------------------------------------------ No results for input(s): TSH, T4TOTAL, T3FREE, THYROIDAB in the last 72 hours.  Invalid input(s): FREET3 ------------------------------------------------------------------------------------------------------------------ No results for input(s): VITAMINB12, FOLATE, FERRITIN, TIBC, IRON, RETICCTPCT in the last 72 hours.   Coagulation profile No results for input(s): INR, PROTIME in the last 168 hours.  No results for input(s): DDIMER in the last 72 hours.  Cardiac Enzymes No results for input(s): CKMB, TROPONINI, MYOGLOBIN in the last 168 hours.  Invalid input(s): CK ------------------------------------------------------------------------------------------------------------------ No results found for: BNP   Kathie Dike M.D on 03/07/2021 at 5:28 PM  Go to [email or web address] - for contact info  [facility] - Office  [phone]

## 2021-03-07 NOTE — Progress Notes (Signed)
4 Days Post-Op  Subjective: Patient has no complaints.  She denies any abdominal pain, nausea, vomiting.  She did not have any symptoms during her HIDA scan.  Objective: Vital signs in last 24 hours: Temp:  [98 F (36.7 C)-98.3 F (36.8 C)] 98 F (36.7 C) (07/11 0815) Pulse Rate:  [83-87] 83 (07/11 0815) Resp:  [18-19] 18 (07/11 0815) BP: (136-160)/(69-108) 136/108 (07/11 0815) SpO2:  [100 %] 100 % (07/11 0815) Weight:  [86.4 kg] 86.4 kg (07/11 0453) Last BM Date: 03/06/21  Intake/Output from previous day: 07/10 0701 - 07/11 0700 In: 1500 [P.O.:1200; IV Piggyback:300] Out: 1150 [Urine:1150] Intake/Output this shift: No intake/output data recorded.  General appearance: alert, cooperative, and no distress GI: soft, non-tender; bowel sounds normal; no masses,  no organomegaly Extremities: Right fifth toe amputation site healing well by secondary intention.  I was not able to express any purulent fluid from the amputation site.  No subcutaneous emphysema noted around the ankle.  Lab Results:  Recent Labs    03/06/21 0551 03/07/21 0606  WBC 12.8* 13.0*  HGB 9.2* 9.0*  HCT 29.2* 29.2*  PLT 486* 465*   BMET Recent Labs    03/06/21 0551 03/07/21 0606  NA 133* 139  K 4.1 4.2  CL 102 105  CO2 23 26  GLUCOSE 212* 93  BUN 18 21  CREATININE 1.25* 1.17*  CALCIUM 8.0* 8.7*   PT/INR No results for input(s): LABPROT, INR in the last 72 hours.  Studies/Results: MR FOOT RIGHT WO CONTRAST  Result Date: 03/07/2021 CLINICAL DATA:  Right foot infection. Recent fifth ray amputation for osteomyelitis. EXAM: MRI OF THE RIGHT ANKLE WITHOUT CONTRAST TECHNIQUE: Multiplanar, multisequence MR imaging of the right ankle was performed. No intravenous contrast was administered. COMPARISON:  Right foot x-rays dated March 01, 2021. FINDINGS: TENDONS Peroneal: Peroneal longus tendon intact. Peroneal brevis intact. Posteromedial: Posterior tibial tendon intact. Flexor digitorum longus tendon  intact. Flexor hallucis longus tendon intact. Anterior: Tibialis anterior tendon intact. Extensor hallucis longus tendon intact Extensor digitorum longus tendons intact with fluid and gas in the proximal tendon sheaths (series 4, image 8; series 7, images 5-6). Achilles:  Intact with mild distal tendinosis. Plantar Fascia: Intact. Thickening of the proximal central band. LIGAMENTS Lateral: Anterior talofibular ligament intact. Calcaneofibular ligament intact. Posterior talofibular ligament intact. Anterior and posterior tibiofibular ligaments intact. Medial: Deltoid ligament intact. Spring ligament intact. CARTILAGE Ankle Joint: No joint effusion. Normal ankle mortise. No chondral defect. Subtalar Joints/Sinus Tarsi: Normal subtalar joints. No subtalar joint effusion. Normal sinus tarsi. Bones: No marrow signal abnormality.  No fracture or dislocation. Soft Tissue: Bimalleolar soft tissue swelling. No soft tissue mass or fluid collection. IMPRESSION: 1. Extensor digitorum longus infectious tenosynovitis. 2. No evidence of osteomyelitis or septic arthritis. 3. Mild distal Achilles tendinosis.  Chronic mild plantar fasciitis. Electronically Signed   By: Titus Dubin M.D.   On: 03/07/2021 12:43   NM Hepato W/EF  Result Date: 03/07/2021 CLINICAL DATA:  Cholelithiasis, nausea, vomiting EXAM: NUCLEAR MEDICINE HEPATOBILIARY IMAGING WITH GALLBLADDER EF TECHNIQUE: Sequential images of the abdomen were obtained out to 60 minutes following intravenous administration of radiopharmaceutical. After slow intravenous infusion of 1.7 micrograms Cholecystokinin, gallbladder ejection fraction was determined. RADIOPHARMACEUTICALS:  5 mCi Tc-83m Choletec IV COMPARISON:  None. FINDINGS: Normal tracer extraction from bloodstream indicating normal hepatocellular function. Normal excretion of tracer into biliary tree. Gallbladder visualized at 27 min. Small bowel visualized at 15 min. No hepatic retention of tracer. Subjectively  decrease emptying of tracer from gallbladder  following CCK administration. Calculated gallbladder ejection fraction is -20%, indicating continued filling of the gallbladder during the post CCK period without emptying. Patient reported no symptoms following CCK administration. Normal gallbladder ejection fraction following CCK stimulation is greater than 40% at 1 hour. IMPRESSION: Patent biliary tree. No gallbladder response to CCK stimulation indicating abnormal gallbladder function. Electronically Signed   By: Lavonia Dana M.D.   On: 03/07/2021 12:55    Anti-infectives: Anti-infectives (From admission, onward)    Start     Dose/Rate Route Frequency Ordered Stop   03/04/21 0800  ceFEPIme (MAXIPIME) 2 g in sodium chloride 0.9 % 100 mL IVPB        2 g 200 mL/hr over 30 Minutes Intravenous Every 12 hours 03/03/21 1840     03/03/21 1930  ceFEPIme (MAXIPIME) 2 g in sodium chloride 0.9 % 100 mL IVPB        2 g 200 mL/hr over 30 Minutes Intravenous  Once 03/03/21 1833 03/03/21 1952   03/02/21 1800  vancomycin (VANCOCIN) IVPB 1000 mg/200 mL premix  Status:  Discontinued        1,000 mg 200 mL/hr over 60 Minutes Intravenous Every 24 hours 03/01/21 1630 03/02/21 0852   03/02/21 1800  vancomycin (VANCOREADY) IVPB 1250 mg/250 mL  Status:  Discontinued        1,250 mg 166.7 mL/hr over 90 Minutes Intravenous Every 24 hours 03/02/21 0852 03/05/21 1812   03/01/21 1800  metroNIDAZOLE (FLAGYL) IVPB 500 mg  Status:  Discontinued        500 mg 100 mL/hr over 60 Minutes Intravenous Every 8 hours 03/01/21 1647 03/03/21 1833   03/01/21 1730  vancomycin (VANCOREADY) IVPB 1500 mg/300 mL        1,500 mg 150 mL/hr over 120 Minutes Intravenous  Once 03/01/21 1624 03/01/21 1856   03/01/21 1715  vancomycin (VANCOCIN) IVPB 1000 mg/200 mL premix  Status:  Discontinued        1,000 mg 200 mL/hr over 60 Minutes Intravenous  Once 03/01/21 1622 03/01/21 1623       Assessment/Plan: s/p Procedure(s): AMPUTATION  TOE Impression: Status post amputation of right fifth toe.  MRI does not show evidence of osteomyelitis or ongoing gas stranding to the ankle.  Will discuss with Dr. Roderic Palau further antibiotic course. HIDA scan shows a decreased ejection fraction, but she had no reproducible symptoms with CCK injection.  At this point, we will start the patient on a carb modified diet and see how she progresses.  She may need cholecystectomy as an outpatient.  LOS: 7 days    [facility]iva Signs 03/07/2021

## 2021-03-07 NOTE — Progress Notes (Signed)
Inpatient Diabetes Program Recommendations  AACE/ADA: New Consensus Statement on Inpatient Glycemic Control   Target Ranges:  Prepandial:   less than 140 mg/dL      Peak postprandial:   less than 180 mg/dL (1-2 hours)      Critically ill patients:  140 - 180 mg/dL  Results for Nicole Golden, Nicole Golden (MRN [id number]) as of 03/07/2021 07:42  Ref. Range 03/07/2021 00:14 03/07/2021 04:32 03/07/2021 07:31  Glucose-Capillary Latest Ref Range: 70 - 99 mg/dL 171 (H)  Novolog 2 units 131 (H)  Novolog 1 units 78   Results for Nicole Golden, Nicole Golden (MRN [id number]) as of 03/07/2021 07:42  Ref. Range 03/06/2021 07:26 03/06/2021 11:17 03/06/2021 16:26 03/06/2021 20:20  Glucose-Capillary Latest Ref Range: 70 - 99 mg/dL 181 (H)  Novolog 12 units 186 (H)  Novolog 12 units 263 (H)  Novolog 15 units 261 (H)  Novolog 5 units  Lantus 70 units   Review of Glycemic Control  Diabetes history: DM2 Outpatient Diabetes medications: Lantus 60 units BID, Novolog 10-16 units TID with meals, Jardiance 10 mg daily (not taking; has not started yet) Current orders for Inpatient glycemic control: Lantus 70 units QHS, Novolog 10 units TID with meals, Novolog 0-9 units Q4H  Inpatient Diabetes Program Recommendations:    Insulin: Noted fasting glucose 78 mg/dl this morning; likely due to getting Novolog correction Q4H.  Please change Novolog correction to 0-9 units TID with meals and Novolog 0-5 units QHS. Please consider increasing meal coverage to Novolog 13 units TID with meals.  Thanks, Barnie Alderman, RN, MSN, CDE Diabetes Coordinator Inpatient Diabetes Program [phone] (Team Pager from 8am to 5pm)

## 2021-03-08 LAB — GLUCOSE, CAPILLARY
Glucose-Capillary: 135 mg/dL — ABNORMAL HIGH (ref 70–99)
Glucose-Capillary: 140 mg/dL — ABNORMAL HIGH (ref 70–99)
Glucose-Capillary: 209 mg/dL — ABNORMAL HIGH (ref 70–99)
Glucose-Capillary: 240 mg/dL — ABNORMAL HIGH (ref 70–99)
Glucose-Capillary: 99 mg/dL (ref 70–99)

## 2021-03-08 LAB — AEROBIC/ANAEROBIC CULTURE W GRAM STAIN (SURGICAL/DEEP WOUND)

## 2021-03-08 MED ORDER — AMLODIPINE BESYLATE 10 MG PO TABS: 10.0000 mg | ORAL_TABLET | Freq: Every day | ORAL | 1 refills | Status: DC

## 2021-03-08 MED ORDER — SULFAMETHOXAZOLE-TRIMETHOPRIM 800-160 MG PO TABS: 1.0000 | ORAL_TABLET | Freq: Two times a day (BID) | ORAL | 0 refills | Status: DC

## 2021-03-08 MED ORDER — ACETAMINOPHEN 325 MG PO TABS: 650.0000 mg | ORAL_TABLET | Freq: Four times a day (QID) | ORAL | Status: DC | PRN

## 2021-03-08 MED ORDER — HYDROCODONE-ACETAMINOPHEN 5-325 MG PO TABS: 1.0000 | ORAL_TABLET | Freq: Four times a day (QID) | ORAL | 0 refills | Status: DC | PRN

## 2021-03-08 MED ORDER — POLYETHYLENE GLYCOL 3350 17 G PO PACK: 17.0000 g | PACK | Freq: Two times a day (BID) | ORAL | 0 refills | Status: DC

## 2021-03-08 MED ORDER — LANTUS SOLOSTAR 100 UNIT/ML ~~LOC~~ SOPN: 40.0000 [IU] | PEN_INJECTOR | Freq: Two times a day (BID) | SUBCUTANEOUS | 11 refills | Status: AC

## 2021-03-08 MED ORDER — SENNA 8.6 MG PO TABS: 2.0000 | ORAL_TABLET | Freq: Every day | ORAL | 0 refills | Status: DC

## 2021-03-08 NOTE — Plan of Care (Signed)
  Problem: Acute Rehab PT Goals(only PT should resolve) Goal: Pt Will Go Supine/Side To Sit Outcome: Progressing Flowsheets (Taken 03/08/2021 1122) Pt will go Supine/Side to Sit:  Independently  with modified independence Goal: Patient Will Transfer Sit To/From Stand Outcome: Progressing Flowsheets (Taken 03/08/2021 1122) Patient will transfer sit to/from stand:  Independently  with modified independence Goal: Pt Will Transfer Bed To Chair/Chair To Bed Outcome: Progressing Flowsheets (Taken 03/08/2021 1122) Pt will Transfer Bed to Chair/Chair to Bed: with modified independence Goal: Pt Will Ambulate Outcome: Progressing Flowsheets (Taken 03/08/2021 1122) Pt will Ambulate:  > 125 feet  with modified independence  with rolling walker   11:23 AM, 03/08/21 Lonell Grandchild, MPT Physical Therapist with [facility] [phone] office [phone] mobile phone

## 2021-03-08 NOTE — Progress Notes (Signed)
Nsg Discharge Note  Admit Date:  02/28/2021 Discharge date: 03/08/2021   [place] to be D/C'd Home per MD order.  AVS completed.  Copy for chart, and copy for patient signed, and dated. Patient/caregiver able to verbalize understanding.  Discharge Medication: Allergies as of 03/08/2021   No Known Allergies      Medication List     STOP taking these medications    losartan-hydrochlorothiazide 100-12.5 MG tablet Commonly known as: HYZAAR       TAKE these medications    Accu-Chek Aviva Plus test strip Generic drug: glucose blood   amLODipine 10 MG tablet Commonly known as: NORVASC Take 1 tablet (10 mg total) by mouth daily. What changed:  medication strength how much to take   aspirin EC 81 MG tablet Take 81 mg by mouth daily. Swallow whole.   atropine 1 % ophthalmic solution Place 1 drop into the left eye 2 (two) times daily.   B-D ULTRAFINE III SHORT PEN 31G X 8 MM Misc Generic drug: Insulin Pen Needle 1 each by Does not apply route as directed.   dorzolamide-timolol 22.3-6.8 MG/ML ophthalmic solution Commonly known as: COSOPT Place 1 drop into the left eye 2 (two) times daily.   famotidine 40 MG tablet Commonly known as: PEPCID Take 40 mg by mouth 2 (two) times daily.   furosemide 20 MG tablet Commonly known as: LASIX Take 20 mg by mouth 2 (two) times daily as needed for edema.   HYDROcodone-acetaminophen 5-325 MG tablet Commonly known as: NORCO/VICODIN Take 1 tablet by mouth every 6 (six) hours as needed for severe pain.   hydrOXYzine 25 MG tablet Commonly known as: ATARAX/VISTARIL Take 25 mg by mouth daily.   Jardiance 10 MG Tabs tablet Generic drug: empagliflozin Take 10 mg by mouth daily.   Lantus SoloStar 100 UNIT/ML Solostar Pen Generic drug: insulin glargine Inject 40 Units into the skin 2 (two) times daily. What changed: how much to take   latanoprost 0.005 % ophthalmic solution Commonly known as: XALATAN Place 1 drop into both  eyes daily.   NovoLOG FlexPen 100 UNIT/ML FlexPen Generic drug: insulin aspart Inject 10-16 Units into the skin 3 (three) times daily before meals.   ondansetron 4 MG disintegrating tablet Commonly known as: Zofran ODT Take 1 tablet (4 mg total) by mouth every 8 (eight) hours as needed for nausea or vomiting.   polyethylene glycol 17 g packet Commonly known as: MIRALAX / GLYCOLAX Take 17 g by mouth 2 (two) times daily.   prednisoLONE acetate 1 % ophthalmic suspension Commonly known as: PRED FORTE Place 1 drop into the left eye 3 (three) times daily.   rosuvastatin 40 MG tablet Commonly known as: CRESTOR Take 40 mg by mouth daily.   senna 8.6 MG Tabs tablet Commonly known as: SENOKOT Take 2 tablets (17.2 mg total) by mouth at bedtime.   sulfamethoxazole-trimethoprim 800-160 MG tablet Commonly known as: BACTRIM DS Take 1 tablet by mouth 2 (two) times daily.               Durable Medical Equipment  (From admission, onward)           Start     Ordered   03/08/21 1621  For home use only DME Walker rolling  Once       Question Answer Comment  Walker: With 5 Inch Wheels   Patient needs a walker to treat with the following condition Generalized weakness      03/08/21 1620  Discharge Care Instructions  (From admission, onward)           Start     Ordered   03/08/21 0000  Discharge wound care:       Comments: She just needs iodoform Nu Gauze dressing to right fifth toe amputation site 3 times a week   03/08/21 1644            Discharge Assessment: Vitals:   03/08/21 0406 03/08/21 1424  BP: (!) 142/69 134/61  Pulse: 84 80  Resp: 19 18  Temp: 98.2 F (36.8 C) 98.4 F (36.9 C)  SpO2: 100% 100%   Skin clean, dry and intact without evidence of skin break down, no evidence of skin tears noted. IV catheter discontinued intact. Site without signs and symptoms of complications - no redness or edema noted at insertion site, patient  denies c/o pain - only slight tenderness at site.  Dressing with slight pressure applied.  D/c Instructions-Education: Discharge instructions given to patient/family with verbalized understanding. D/c education completed with patient/family including follow up instructions, medication list, d/c activities limitations if indicated, with other d/c instructions as indicated by MD - patient able to verbalize understanding, all questions fully answered. Patient instructed to return to ED, call 911, or call MD for any changes in condition.  Patient escorted via [place], and D/C home via private auto.  Zachery Conch, RN 03/08/2021 5:46 PM

## 2021-03-08 NOTE — Discharge Summary (Signed)
Physician Discharge Summary  LATRISE BOWLAND GEX:[id number] DOB: 06-Nov-1960 DOA: 02/28/2021  PCP: [name], DO  Admit date: 02/28/2021 Discharge date: 03/08/2021  Admitted From: Home Disposition: Home  Recommendations for Outpatient Follow-up:  Follow up with PCP in 1-2 weeks Please obtain BMP/CBC in one week Follow-up with general surgery and 2 weeks for wound check Cholelithiasis will also be addressed as an outpatient by general surgery  Home Health: Home health RN, PT Equipment/Devices: Walker  Discharge Condition: Stable CODE STATUS: Full code Diet recommendation: Heart healthy, carb modified  Brief/Interim Summary: 60 year old female with a history of diabetes, peripheral neuropathy, hypertension, chronic kidney disease stage IIIb, admitted with cellulitis/gangrenous changes of her right fifth toe.  She was admitted for intravenous antibiotics and surgical management.  Discharge Diagnoses:  Principal Problem:   Sepsis due to Rt Foot cellulitis/Abscess/OsteoMyelitis Active Problems:   Type II diabetes mellitus with complication, uncontrolled (HCC)   Nausea & vomiting   Essential hypertension   Idiopathic acute pancreatitis without infection or necrosis   Cellulitis and Abscess and Osteomyelitis in diabetic foot ---Rt   Gallstones   Gangrene of toe of right foot (HCC)  Severe sepsis secondary to right foot cellulitis/acute osteomyelitis -Patient was treated with intravenous antibiotics -Seen by general surgery and underwent right fifth toe amputation on 7/7 -Cultures positive for Enterobacter -Follow-up MRI did not show any residual abscess, osteomyelitis or evidence of septic arthritis -Antibiotics transition to Bactrim -She will follow-up with general surgery for wound check -Wound care instructions include: iodoform Nu Gauze dressing to right fifth toe amputation site 3 times a week  Cholelithiasis -Mild elevation of LFTs noted -MRCP did not show any evidence of  biliary dilatation or choledocholithiasis -She did have a HIDA scan that did not show any obvious evidence of acute cholecystitis -Since patient was clinically improved and not have any nausea and vomiting, this will be addressed as an outpatient with general surgery  Mild pancreatitis -Lipase up to 73 -Overall nausea and vomiting has resolved with supportive measures  AKI on CKD stage III -Creatinine was 1.8 on admission -This improved to normal range with IV hydration  Hypertension -Losartan/hydrochlorothiazide held in light of acute kidney injury -Blood pressure currently stable on amlodipine  Diabetes, type II -Lantus and NovoLog -Home dose has been adjusted -Blood sugars have been improved since infection has been controlled  Chronic anemia, likely related to CKD -Likely had some element of blood loss from surgery -May benefit from outpatient ESA/EPO -Hemoglobin currently stable  Lower extremity edema -Venous Dopplers negative for DVT -Home dose of Lasix was resumed and overall edema has also improved  Constipation -Suspect this was contributing to nausea and vomiting -Resolved after she received an enema -Continued on twice daily MiraLAX  Generalized weakness -Seen by physical therapy with recommendations for home health PT  Discharge Instructions  Discharge Instructions     Diet - low sodium heart healthy   Complete by: As directed    Discharge wound care:   Complete by: As directed    She just needs iodoform Nu Gauze dressing to right fifth toe amputation site 3 times a week   Increase activity slowly   Complete by: As directed       Allergies as of 03/08/2021   No Known Allergies      Medication List     STOP taking these medications    losartan-hydrochlorothiazide 100-12.5 MG tablet Commonly known as: HYZAAR       TAKE these medications  Accu-Chek Aviva Plus test strip Generic drug: glucose blood   amLODipine 10 MG tablet Commonly  known as: NORVASC Take 1 tablet (10 mg total) by mouth daily. What changed:  medication strength how much to take   aspirin EC 81 MG tablet Take 81 mg by mouth daily. Swallow whole.   atropine 1 % ophthalmic solution Place 1 drop into the left eye 2 (two) times daily.   B-D ULTRAFINE III SHORT PEN 31G X 8 MM Misc Generic drug: Insulin Pen Needle 1 each by Does not apply route as directed.   dorzolamide-timolol 22.3-6.8 MG/ML ophthalmic solution Commonly known as: COSOPT Place 1 drop into the left eye 2 (two) times daily.   famotidine 40 MG tablet Commonly known as: PEPCID Take 40 mg by mouth 2 (two) times daily.   furosemide 20 MG tablet Commonly known as: LASIX Take 20 mg by mouth 2 (two) times daily as needed for edema.   HYDROcodone-acetaminophen 5-325 MG tablet Commonly known as: NORCO/VICODIN Take 1 tablet by mouth every 6 (six) hours as needed for severe pain.   hydrOXYzine 25 MG tablet Commonly known as: ATARAX/VISTARIL Take 25 mg by mouth daily.   Jardiance 10 MG Tabs tablet Generic drug: empagliflozin Take 10 mg by mouth daily.   Lantus SoloStar 100 UNIT/ML Solostar Pen Generic drug: insulin glargine Inject 40 Units into the skin 2 (two) times daily. What changed: how much to take   latanoprost 0.005 % ophthalmic solution Commonly known as: XALATAN Place 1 drop into both eyes daily.   NovoLOG FlexPen 100 UNIT/ML FlexPen Generic drug: insulin aspart Inject 10-16 Units into the skin 3 (three) times daily before meals.   ondansetron 4 MG disintegrating tablet Commonly known as: Zofran ODT Take 1 tablet (4 mg total) by mouth every 8 (eight) hours as needed for nausea or vomiting.   polyethylene glycol 17 g packet Commonly known as: MIRALAX / GLYCOLAX Take 17 g by mouth 2 (two) times daily.   prednisoLONE acetate 1 % ophthalmic suspension Commonly known as: PRED FORTE Place 1 drop into the left eye 3 (three) times daily.   rosuvastatin 40 MG  tablet Commonly known as: CRESTOR Take 40 mg by mouth daily.   senna 8.6 MG Tabs tablet Commonly known as: SENOKOT Take 2 tablets (17.2 mg total) by mouth at bedtime.   sulfamethoxazole-trimethoprim 800-160 MG tablet Commonly known as: BACTRIM DS Take 1 tablet by mouth 2 (two) times daily.               Durable Medical Equipment  (From admission, onward)           Start     Ordered   03/08/21 1621  For home use only DME Walker rolling  Once       Question Answer Comment  Walker: With 5 Inch Wheels   Patient needs a walker to treat with the following condition Generalized weakness      03/08/21 1620              Discharge Care Instructions  (From admission, onward)           Start     Ordered   03/08/21 0000  Discharge wound care:       Comments: She just needs iodoform Nu Gauze dressing to right fifth toe amputation site 3 times a week   03/08/21 1644            Follow-up Information     Aviva Signs, MD. Schedule  an appointment as soon as possible for a visit on 03/22/2021.   Specialty: General Surgery Contact information: [address] [place] [address] [phone]         [name], DO. Schedule an appointment as soon as possible for a visit in 2 week(s).   Specialty: General Practice Contact information: [place] [address] [phone]                No Known Allergies  Consultations: General surgery   Procedures/Studies: CT ABDOMEN PELVIS WO CONTRAST  Result Date: 02/28/2021 CLINICAL DATA:  Nonlocalized acute abdominal pain. EXAM: CT ABDOMEN AND PELVIS WITHOUT CONTRAST TECHNIQUE: Multidetector CT imaging of the abdomen and pelvis was performed following the standard protocol without IV contrast. COMPARISON:  Ultrasound pelvis 02/04/2018. FINDINGS: Lower chest: No acute abnormality. Hepatobiliary: No focal liver abnormality. Tiny calcified stones noted within the gallbladder lumen. No  gallbladder wall thickening or pericholecystic fluid. No biliary dilatation. Pancreas: No focal lesion. Slight hazy pancreatic contour likely due to motion artifact. Otherwise normal pancreatic contour. No surrounding inflammatory changes. No main pancreatic ductal dilatation. Spleen: Normal in size without focal abnormality. A splenule is noted. Adrenals/Urinary Tract: No adrenal nodule bilaterally. Bilateral kidneys enhance symmetrically. No hydronephrosis. No hydroureter. The urinary bladder is unremarkable. Stomach/Bowel: Stomach is within normal limits. No evidence of bowel wall thickening or dilatation. Appendix appears normal. Vascular/Lymphatic: No abdominal aorta or iliac aneurysm. At least moderate atherosclerotic plaque of the aorta and its branches. No abdominal, pelvic, or inguinal lymphadenopathy. Reproductive: Redemonstration of a coarsely calcified left subserosal fibroid measuring up to at least 5.5 cm. A central coarsely calcified or in lesion is also noted likely representing known submucosal uterine fibroid. Bilateral adnexa are otherwise unremarkable. Other: No intraperitoneal free fluid. No intraperitoneal free gas. No organized fluid collection. Musculoskeletal: No abdominal wall hernia or abnormality. No suspicious lytic or blastic osseous lesions. No acute displaced fracture. IMPRESSION: 1. Slight hazy pancreatic contour likely due to motion artifact. Differential diagnosis includes acute pancreatitis. Recommend correlation with lipase levels. 2. Cholelithiasis no CT findings of acute cholecystitis. 3. Subserosal and submucosal degenerative uterine fibroids. 4. Please note limited evaluation on this noncontrast study. Electronically Signed   By: Iven Finn M.D.   On: 02/28/2021 17:06   MR FOOT RIGHT WO CONTRAST  Result Date: 03/07/2021 CLINICAL DATA:  Right foot infection. Recent fifth ray amputation for osteomyelitis. EXAM: MRI OF THE RIGHT ANKLE WITHOUT CONTRAST TECHNIQUE:  Multiplanar, multisequence MR imaging of the right ankle was performed. No intravenous contrast was administered. COMPARISON:  Right foot x-rays dated March 01, 2021. FINDINGS: TENDONS Peroneal: Peroneal longus tendon intact. Peroneal brevis intact. Posteromedial: Posterior tibial tendon intact. Flexor digitorum longus tendon intact. Flexor hallucis longus tendon intact. Anterior: Tibialis anterior tendon intact. Extensor hallucis longus tendon intact Extensor digitorum longus tendons intact with fluid and gas in the proximal tendon sheaths (series 4, image 8; series 7, images 5-6). Achilles:  Intact with mild distal tendinosis. Plantar Fascia: Intact. Thickening of the proximal central band. LIGAMENTS Lateral: Anterior talofibular ligament intact. Calcaneofibular ligament intact. Posterior talofibular ligament intact. Anterior and posterior tibiofibular ligaments intact. Medial: Deltoid ligament intact. Spring ligament intact. CARTILAGE Ankle Joint: No joint effusion. Normal ankle mortise. No chondral defect. Subtalar Joints/Sinus Tarsi: Normal subtalar joints. No subtalar joint effusion. Normal sinus tarsi. Bones: No marrow signal abnormality.  No fracture or dislocation. Soft Tissue: Bimalleolar soft tissue swelling. No soft tissue mass or fluid collection. IMPRESSION: 1. Extensor digitorum longus infectious tenosynovitis. 2.  No evidence of osteomyelitis or septic arthritis. 3. Mild distal Achilles tendinosis.  Chronic mild plantar fasciitis. Electronically Signed   By: Titus Dubin M.D.   On: 03/07/2021 12:43   MR ABDOMEN MRCP WO CONTRAST  Result Date: 03/01/2021 CLINICAL DATA:  Abdominal pain.  Cholelithiasis. EXAM: MRI ABDOMEN WITHOUT CONTRAST  (INCLUDING MRCP) TECHNIQUE: Multiplanar multisequence MR imaging of the abdomen was performed. Heavily T2-weighted images of the biliary and pancreatic ducts were obtained, and three-dimensional MRCP images were rendered by post processing. COMPARISON:  CT abdomen  02/28/2021 FINDINGS: Despite efforts by the technologist and patient, motion artifact is present on today's exam and could not be eliminated. This reduces exam sensitivity and specificity. Lower chest: Unremarkable Hepatobiliary: The small dependent gallstone in the gallbladder was better seen at CT. No biliary dilatation. No discrete filling defect in the common bile duct which measures 0.5 cm in diameter and which demonstrates normal conical tapering. No significant focal liver lesion identified. Pancreas:  Unremarkable Spleen:  Unremarkable Adrenals/Urinary Tract:  Both adrenal glands appear normal. 0.9 cm fluid density lesion of the left mid kidney anteriorly on image 21 series 4, probably a cyst. Similar 0.7 cm lesion of the right kidney upper pole on image 25 series 5. Stomach/Bowel: Unremarkable Vascular/Lymphatic:  Aortoiliac atherosclerotic vascular disease. Other: Uterine fibroids are observed on the coronal T2 weighted images. Musculoskeletal: Unremarkable IMPRESSION: 1. No biliary dilatation or appreciable choledocholithiasis on today's examination. The patient has known cholelithiasis based on recent CT although previously seen gallstone is less conspicuous on today's MRI. 2. Small fluid density renal lesions favoring cysts. 3.  Aortic Atherosclerosis (ICD10-I70.0). 4. Uterine fibroids. Electronically Signed   By: Van Clines M.D.   On: 03/01/2021 10:47   MR 3D Recon At Scanner  Result Date: 03/02/2021 : Please see accession [id number] [facility] for MRCP report. Electronically Signed   By: Van Clines M.D.   On: 03/02/2021 08:06   NM Hepato W/EF  Result Date: 03/07/2021 CLINICAL DATA:  Cholelithiasis, nausea, vomiting EXAM: NUCLEAR MEDICINE HEPATOBILIARY IMAGING WITH GALLBLADDER EF TECHNIQUE: Sequential images of the abdomen were obtained out to 60 minutes following intravenous administration of radiopharmaceutical. After slow intravenous infusion of 1.7 micrograms Cholecystokinin,  gallbladder ejection fraction was determined. RADIOPHARMACEUTICALS:  5 mCi Tc-33m Choletec IV COMPARISON:  None. FINDINGS: Normal tracer extraction from bloodstream indicating normal hepatocellular function. Normal excretion of tracer into biliary tree. Gallbladder visualized at 27 min. Small bowel visualized at 15 min. No hepatic retention of tracer. Subjectively decrease emptying of tracer from gallbladder following CCK administration. Calculated gallbladder ejection fraction is -20%, indicating continued filling of the gallbladder during the post CCK period without emptying. Patient reported no symptoms following CCK administration. Normal gallbladder ejection fraction following CCK stimulation is greater than 40% at 1 hour. IMPRESSION: Patent biliary tree. No gallbladder response to CCK stimulation indicating abnormal gallbladder function. Electronically Signed   By: Lavonia Dana M.D.   On: 03/07/2021 12:55   US Venous Img Lower Unilateral Right (DVT)  Result Date: 03/04/2021 CLINICAL DATA:  Right lower extremity edema. Recent right fifth toe amputation. EXAM: RIGHT LOWER EXTREMITY VENOUS DOPPLER ULTRASOUND TECHNIQUE: Gray-scale sonography with graded compression, as well as color Doppler and duplex ultrasound were performed to evaluate the lower extremity deep venous systems from the level of the common femoral vein and including the common femoral, femoral, profunda femoral, popliteal and calf veins including the posterior tibial, peroneal and gastrocnemius veins when visible. The superficial great saphenous vein was also interrogated. Spectral Doppler was utilized  to evaluate flow at rest and with distal augmentation maneuvers in the common femoral, femoral and popliteal veins. COMPARISON:  None. FINDINGS: Contralateral Common Femoral Vein: Respiratory phasicity is normal and symmetric with the symptomatic side. No evidence of thrombus. Normal compressibility. Common Femoral Vein: No evidence of thrombus.  Normal compressibility, respiratory phasicity and response to augmentation. Saphenofemoral Junction: No evidence of thrombus. Normal compressibility and flow on color Doppler imaging. Profunda Femoral Vein: No evidence of thrombus. Normal compressibility and flow on color Doppler imaging. Femoral Vein: No evidence of thrombus. Normal compressibility, respiratory phasicity and response to augmentation. Popliteal Vein: No evidence of thrombus. Normal compressibility, respiratory phasicity and response to augmentation. Calf Veins: No evidence of thrombus. Normal compressibility and flow on color Doppler imaging. Superficial Great Saphenous Vein: No evidence of thrombus. Normal compressibility. Venous Reflux:  None. Other Findings: No evidence of superficial thrombophlebitis or abnormal fluid collection. IMPRESSION: No evidence of right lower extremity deep venous thrombosis. Electronically Signed   By: Aletta Edouard M.D.   On: 03/04/2021 16:13   US ARTERIAL SEG MULTIPLE LE (ABI, SEGMENTAL PRESSURES, PVR'S)  Result Date: 03/02/2021 CLINICAL DATA:  Right toe gangrene EXAM: NONINVASIVE PHYSIOLOGIC VASCULAR STUDY OF BILATERAL LOWER EXTREMITIES TECHNIQUE: Non-invasive vascular evaluation of both lower extremities was performed at rest, including calculation of ankle-brachial indices, multiple segmental pressure evaluation, segmental Doppler and segmental pulse volume recording. COMPARISON:  None. FINDINGS: Right Lower Extremity Resting ABI:  0.73 Segmental Pressures: Significant pressure gradient between the upper and lower thigh cuff consistent with SFA disease. Additionally, pressure gradients are present between the calf and ankle cuff consistent with below the knee disease. Arterial Waveforms: Transition to abnormal monophasic arterial waveform in the popliteal artery consistent with SFA occlusive disease. PVRs: Markedly blunted PVRs in the digits likely reflects severe small vessel disease. Left Lower Extremity:  Resting ABI: 0.91 Segmental Pressures: Pressure gradient between the lower thigh and calf cuffs consistent with distal femoropopliteal disease. Arterial Waveforms: Irregular arterial waveforms likely affected by artifact. PVRs: Digital waveforms are relatively preserved. Other: Symmetric upper extremity pressures. Ankle Brachial index > 1.4 Non diagnostic secondary to incompressible vessel calcifications 1.0-1.4       Normal 0.9-0.99     Borderline PAD 0.8-0.89     Mild PAD 0.5-0.79     Moderate PAD < 0.5          Severe PAD Toe Brachial Index Normal     >0.65 Moderate  0.53-0.64 Severe     <0.23 Toe Pressures Absolute toe pressure >59mmHg sufficient for wound healing. Toe pressures <29mmHg = critical limb ischemia. IMPRESSION: 1. Abnormal resting right ankle-brachial index of 0.73 consistent with at least moderate peripheral arterial disease. 2. Segmental evaluation suggests outflow (superficial femoral) occlusive disease as well as runoff (below the knee) disease. Additionally, there is significant diminution of the arterial waveforms in the digits consistent with small vessel disease. 3. Resting left ankle-brachial index of 0.91 consistent with borderline peripheral arterial disease. 4. Segmental evaluation suggests an element of stenosis in the distal femoropopliteal segment on the left. Signed, Criselda Peaches, MD, [facility] Electronically Signed   By: Jacqulynn Cadet M.D.   On: 03/02/2021 12:08   DG Abd Acute W/Chest  Result Date: 02/28/2021 CLINICAL DATA:  Vomiting. EXAM: DG ABDOMEN ACUTE WITH 1 VIEW CHEST COMPARISON:  None. FINDINGS: There is no evidence of dilated bowel loops or free intraperitoneal air. Calcified uterine fibroid is noted in the pelvis. Heart size and mediastinal contours  are within normal limits. Both lungs are clear. IMPRESSION: No abnormal bowel dilatation.  No acute cardiopulmonary disease. Electronically Signed    By: Marijo Conception M.D.   On: 02/28/2021 13:39   DG Foot Complete Right  Result Date: 03/01/2021 CLINICAL DATA:  Swelling with yellow fluid EXAM: RIGHT FOOT COMPLETE - 3+ VIEW COMPARISON:  None. FINDINGS: No fracture or malalignment. Moderate gas within the dorsal soft tissues of the foot extending to the anterior ankle region concerning for gas forming/necrotizing infection. Possible ulceration of the fifth digit at the level of the IP joint. Possible small erosion at the head of the fifth proximal phalanx. IMPRESSION: 1. Soft tissue swelling. Gas within the soft tissues of the dorsum of the foot and anterior to the ankle raises concern for gas forming/necrotizing infection 2. Possible ulceration of the fifth digit. Suspect small cortical erosion at the head of the fifth proximal phalanx raising concern for osteomyelitis These results will be called to the ordering clinician or representative by the Radiologist Assistant, and communication documented in the PACS or [employer]. Electronically Signed   By: Donavan Foil M.D.   On: 03/01/2021 17:00      Subjective: No further nausea or vomiting.  Pain is controlled.  Discharge Exam: Vitals:   03/07/21 1957 03/08/21 0003 03/08/21 0406 03/08/21 1424  BP: 139/76 (!) 151/71 (!) 142/69 134/61  Pulse: 88 94 84 80  Resp: 19 19 19 18   Temp: 98.1 F (36.7 C) 97.9 F (36.6 C) 98.2 F (36.8 C) 98.4 F (36.9 C)  TempSrc: Oral Oral Oral Oral  SpO2: 100% 100% 100% 100%  Weight:      Height:        General: Pt is alert, awake, not in acute distress Cardiovascular: RRR, S1/S2 +, no rubs, no gallops Respiratory: CTA bilaterally, no wheezing, no rhonchi Abdominal: Soft, NT, ND, bowel sounds + Extremities: Right foot is dressed    The results of significant diagnostics from this hospitalization (including imaging, microbiology, ancillary and laboratory) are listed below for reference.     Microbiology: Recent Results (from the past 240  hour(s))  SARS CORONAVIRUS 2 (TAT 6-24 HRS) Nasopharyngeal Nasopharyngeal Swab     Status: None   Collection Time: 02/28/21  8:58 PM   Specimen: Nasopharyngeal Swab  Result Value Ref Range Status   SARS Coronavirus 2 NEGATIVE NEGATIVE Final    Comment: (NOTE) SARS-CoV-2 target nucleic acids are NOT DETECTED.  The SARS-CoV-2 RNA is generally detectable in upper and lower respiratory specimens during the acute phase of infection. Negative results do not preclude SARS-CoV-2 infection, do not rule out co-infections with other pathogens, and should not be used as the sole basis for treatment or other patient management decisions. Negative results must be combined with clinical observations, patient history, and epidemiological information. The expected result is Negative.  Fact Sheet for Patients: SugarRoll.be  Fact Sheet for Healthcare Providers: https://www.woods-mathews.com/  This test is not yet approved or cleared by the [place] FDA and  has been authorized for detection and/or diagnosis of SARS-CoV-2 by FDA under an Emergency Use Authorization (EUA). This EUA will remain  in effect (meaning this test can be used) for the duration of the COVID-19 declaration under Se ction 564(b)(1) of the Act, 21 U.S.C. section 360bbb-3(b)(1), unless the authorization is terminated or revoked sooner.  Performed at [facility], [place] [address] [place], [place] [address]   Aerobic/Anaerobic Culture w Gram Stain (surgical/deep wound)     Status:  None   Collection Time: 03/01/21  4:20 PM   Specimen: Foot; Abscess  Result Value Ref Range Status   Specimen Description   Final    FOOT Performed at [facility], [address], [place], [place] [address]    Special Requests   Final    NONE Performed at [facility], [address] [name], [place] [address]    Gram Stain   Final    RARE WBC PRESENT, PREDOMINANTLY PMN ABUNDANT GRAM POSITIVE  COCCI    Culture   Final    ABUNDANT ENTEROBACTER CLOACAE WITHIN MIXED ORGANISMS NO ANAEROBES ISOLATED Performed at [facility], [address], [place], [place] [address]    Report Status 03/06/2021 FINAL  Final   Organism ID, Bacteria ENTEROBACTER CLOACAE  Final      Susceptibility   Enterobacter cloacae - MIC*    CEFAZOLIN >=64 RESISTANT Resistant     CEFEPIME <=0.12 SENSITIVE Sensitive     CEFTAZIDIME <=1 SENSITIVE Sensitive     CIPROFLOXACIN <=0.25 SENSITIVE Sensitive     GENTAMICIN <=1 SENSITIVE Sensitive     IMIPENEM <=0.25 SENSITIVE Sensitive     TRIMETH/SULFA <=20 SENSITIVE Sensitive     PIP/TAZO <=4 SENSITIVE Sensitive     * ABUNDANT ENTEROBACTER CLOACAE  Culture, blood (Routine X 2) w Reflex to ID Panel     Status: None   Collection Time: 03/01/21  5:57 PM   Specimen: BLOOD RIGHT ARM  Result Value Ref Range Status   Specimen Description BLOOD RIGHT ARM  Final   Special Requests   Final    BOTTLES DRAWN AEROBIC AND ANAEROBIC Blood Culture adequate volume   Culture   Final    NO GROWTH 5 DAYS Performed at [facility], [address] [place], [place] [address]    Report Status 03/06/2021 FINAL  Final  Culture, blood (Routine X 2) w Reflex to ID Panel     Status: None   Collection Time: 03/01/21  5:57 PM   Specimen: BLOOD LEFT HAND  Result Value Ref Range Status   Specimen Description BLOOD LEFT HAND  Final   Special Requests   Final    BOTTLES DRAWN AEROBIC AND ANAEROBIC Blood Culture adequate volume   Culture   Final    NO GROWTH 5 DAYS Performed at [facility], [address], [place], [place] [address]    Report Status 03/06/2021 FINAL  Final  Surgical pcr screen     Status: None   Collection Time: 03/03/21  5:03 AM   Specimen: Nasal Mucosa; Nasal Swab  Result Value Ref Range Status   MRSA, PCR NEGATIVE NEGATIVE Final   Staphylococcus aureus NEGATIVE NEGATIVE Final    Comment: (NOTE) The Xpert SA Assay (FDA approved for NASAL specimens in  patients 20 years of age and older), is one component of a comprehensive surveillance program. It is not intended to diagnose infection nor to guide or monitor treatment. Performed at [facility], [address] [place], [place] [address]   Aerobic/Anaerobic Culture w Gram Stain (surgical/deep wound)     Status: None   Collection Time: 03/03/21  8:50 AM   Specimen: PATH Other; Tissue  Result Value Ref Range Status   Specimen Description   Final    WOUND Performed at [facility], [address], [place], [place] [address]    Special Requests   Final    NONE Performed at [facility], [address], [place], [place] [address]    Gram Stain  Final    RARE WBC PRESENT,BOTH PMN AND MONONUCLEAR ABUNDANT GRAM POSITIVE COCCI FEW GRAM VARIABLE ROD    Culture   Final    MODERATE ENTEROBACTER CLOACAE WITHIN MIXED ORGANISMS NO ANAEROBES ISOLATED Performed at [facility], [address] [place], [place] [address]    Report Status 03/08/2021 FINAL  Final   Organism ID, Bacteria ENTEROBACTER CLOACAE  Final      Susceptibility   Enterobacter cloacae - MIC*    CEFAZOLIN >=64 RESISTANT Resistant     CEFEPIME <=0.12 SENSITIVE Sensitive     CEFTAZIDIME <=1 SENSITIVE Sensitive     CIPROFLOXACIN <=0.25 SENSITIVE Sensitive     GENTAMICIN <=1 SENSITIVE Sensitive     IMIPENEM <=0.25 SENSITIVE Sensitive     TRIMETH/SULFA <=20 SENSITIVE Sensitive     PIP/TAZO <=4 SENSITIVE Sensitive     * MODERATE ENTEROBACTER CLOACAE     Labs: BNP (last 3 results) No results for input(s): BNP in the last 8760 hours. Basic Metabolic Panel: Recent Labs  Lab 03/03/21 0538 03/04/21 0108 03/04/21 0538 03/05/21 0444 03/06/21 0551 03/07/21 0606  NA 136  --  136 138 133* 139  K 3.2*  --  3.0* 3.4* 4.1 4.2  CL 106  --  107 107 102 105  CO2 21*  --  20* 22 23 26   GLUCOSE 158* 268* 260* 145* 212* 93  BUN 23  --  22 19 18 21   CREATININE 1.41*  --  1.50* 1.42* 1.25* 1.17*  CALCIUM 8.3*  --  7.6*  8.2* 8.0* 8.7*  MG  --   --  1.8  --   --   --    Liver Function Tests: Recent Labs  Lab 03/03/21 0538 03/04/21 0538 03/05/21 0444 03/06/21 0551 03/07/21 0606  AST 119* 100* 95* 100* 89*  ALT 94* 76* 77* 85* 86*  ALKPHOS 163* 218* 291* 353* 345*  BILITOT 0.5 0.7 0.6 0.2* 0.3  PROT 6.3* 5.8* 6.7 6.9 6.9  ALBUMIN 2.3* 2.0* 2.3* 2.2* 2.3*   Recent Labs  Lab 03/03/21 0538 03/05/21 1254  LIPASE 23 32   No results for input(s): AMMONIA in the last 168 hours. CBC: Recent Labs  Lab 03/03/21 0538 03/04/21 0538 03/05/21 0444 03/06/21 0551 03/07/21 0606  WBC 11.7* 11.7* 12.3* 12.8* 13.0*  HGB 9.3* 8.4* 9.3* 9.2* 9.0*  HCT 29.0* 26.5* 29.5* 29.2* 29.2*  MCV 80.3 79.8* 81.0 80.4 80.9  PLT 327 325 408* 486* 465*   Cardiac Enzymes: No results for input(s): CKTOTAL, CKMB, CKMBINDEX, TROPONINI in the last 168 hours. BNP: Invalid input(s): POCBNP CBG: Recent Labs  Lab 03/08/21 0000 03/08/21 0402 03/08/21 0721 03/08/21 1112 03/08/21 1604  GLUCAP 240* 140* 99 135* 209*   D-Dimer No results for input(s): DDIMER in the last 72 hours. Hgb A1c No results for input(s): HGBA1C in the last 72 hours. Lipid Profile No results for input(s): CHOL, HDL, LDLCALC, TRIG, CHOLHDL, LDLDIRECT in the last 72 hours. Thyroid function studies No results for input(s): TSH, T4TOTAL, T3FREE, THYROIDAB in the last 72 hours.  Invalid input(s): FREET3 Anemia work up No results for input(s): VITAMINB12, FOLATE, FERRITIN, TIBC, IRON, RETICCTPCT in the last 72 hours. Urinalysis    Component Value Date/Time   COLORURINE YELLOW 03/01/2021 2300   APPEARANCEUR HAZY (A) 03/01/2021 2300   LABSPEC 1.015 03/01/2021 2300   PHURINE 5.0 03/01/2021 2300   GLUCOSEU >=500 (A) 03/01/2021 2300   HGBUR SMALL (A) 03/01/2021 2300   BILIRUBINUR NEGATIVE 03/01/2021 2300  KETONESUR NEGATIVE 03/01/2021 2300   PROTEINUR >=300 (A) 03/01/2021 2300   NITRITE NEGATIVE 03/01/2021 2300   LEUKOCYTESUR NEGATIVE  03/01/2021 2300   Sepsis Labs Invalid input(s): PROCALCITONIN,  WBC,  LACTICIDVEN Microbiology Recent Results (from the past 240 hour(s))  SARS CORONAVIRUS 2 (TAT 6-24 HRS) Nasopharyngeal Nasopharyngeal Swab     Status: None   Collection Time: 02/28/21  8:58 PM   Specimen: Nasopharyngeal Swab  Result Value Ref Range Status   SARS Coronavirus 2 NEGATIVE NEGATIVE Final    Comment: (NOTE) SARS-CoV-2 target nucleic acids are NOT DETECTED.  The SARS-CoV-2 RNA is generally detectable in upper and lower respiratory specimens during the acute phase of infection. Negative results do not preclude SARS-CoV-2 infection, do not rule out co-infections with other pathogens, and should not be used as the sole basis for treatment or other patient management decisions. Negative results must be combined with clinical observations, patient history, and epidemiological information. The expected result is Negative.  Fact Sheet for Patients: SugarRoll.be  Fact Sheet for Healthcare Providers: https://www.woods-mathews.com/  This test is not yet approved or cleared by the [place] FDA and  has been authorized for detection and/or diagnosis of SARS-CoV-2 by FDA under an Emergency Use Authorization (EUA). This EUA will remain  in effect (meaning this test can be used) for the duration of the COVID-19 declaration under Se ction 564(b)(1) of the Act, 21 U.S.C. section 360bbb-3(b)(1), unless the authorization is terminated or revoked sooner.  Performed at [facility], [place] [address], [place], [place] [address]   Aerobic/Anaerobic Culture w Gram Stain (surgical/deep wound)     Status: None   Collection Time: 03/01/21  4:20 PM   Specimen: Foot; Abscess  Result Value Ref Range Status   Specimen Description   Final    FOOT Performed at [facility], [address], [place], [place] [address]    Special Requests   Final    NONE Performed at [facility], [address], [place], [place] [address]    Gram Stain   Final    RARE WBC PRESENT, PREDOMINANTLY PMN ABUNDANT GRAM POSITIVE COCCI    Culture   Final    ABUNDANT ENTEROBACTER CLOACAE WITHIN MIXED ORGANISMS NO ANAEROBES ISOLATED Performed at [facility], [address] [place], [place] [address]    Report Status 03/06/2021 FINAL  Final   Organism ID, Bacteria ENTEROBACTER CLOACAE  Final      Susceptibility   Enterobacter cloacae - MIC*    CEFAZOLIN >=64 RESISTANT Resistant     CEFEPIME <=0.12 SENSITIVE Sensitive     CEFTAZIDIME <=1 SENSITIVE Sensitive     CIPROFLOXACIN <=0.25 SENSITIVE Sensitive     GENTAMICIN <=1 SENSITIVE Sensitive     IMIPENEM <=0.25 SENSITIVE Sensitive     TRIMETH/SULFA <=20 SENSITIVE Sensitive     PIP/TAZO <=4 SENSITIVE Sensitive     * ABUNDANT ENTEROBACTER CLOACAE  Culture, blood (Routine X 2) w Reflex to ID Panel     Status: None   Collection Time: 03/01/21  5:57 PM   Specimen: BLOOD RIGHT ARM  Result Value Ref Range Status   Specimen Description BLOOD RIGHT ARM  Final   Special Requests   Final    BOTTLES DRAWN AEROBIC AND ANAEROBIC Blood Culture adequate volume   Culture   Final    NO GROWTH 5 DAYS Performed at [facility], [address], [place], [place] [address]    Report Status 03/06/2021 FINAL  Final  Culture, blood (Routine  X 2) w Reflex to ID Panel     Status: None   Collection Time: 03/01/21  5:57 PM   Specimen: BLOOD LEFT HAND  Result Value Ref Range Status   Specimen Description BLOOD LEFT HAND  Final   Special Requests   Final    BOTTLES DRAWN AEROBIC AND ANAEROBIC Blood Culture adequate volume   Culture   Final    NO GROWTH 5 DAYS Performed at [facility], [address] [place], [place] [address]    Report Status 03/06/2021 FINAL  Final  Surgical pcr screen     Status: None   Collection Time: 03/03/21  5:03 AM   Specimen: Nasal Mucosa; Nasal Swab  Result Value Ref Range Status   MRSA, PCR NEGATIVE NEGATIVE Final    Staphylococcus aureus NEGATIVE NEGATIVE Final    Comment: (NOTE) The Xpert SA Assay (FDA approved for NASAL specimens in patients 72 years of age and older), is one component of a comprehensive surveillance program. It is not intended to diagnose infection nor to guide or monitor treatment. Performed at [facility], [address], [place], [place] [address]   Aerobic/Anaerobic Culture w Gram Stain (surgical/deep wound)     Status: None   Collection Time: 03/03/21  8:50 AM   Specimen: PATH Other; Tissue  Result Value Ref Range Status   Specimen Description   Final    WOUND Performed at [facility], [address], [place], [place] [address]    Special Requests   Final    NONE Performed at [facility], [address] [place], [place] [address]    Gram Stain   Final    RARE WBC PRESENT,BOTH PMN AND MONONUCLEAR ABUNDANT GRAM POSITIVE COCCI FEW GRAM VARIABLE ROD    Culture   Final    MODERATE ENTEROBACTER CLOACAE WITHIN MIXED ORGANISMS NO ANAEROBES ISOLATED Performed at [facility], [place] [address], [place],  [address]    Report Status 03/08/2021 FINAL  Final   Organism ID, Bacteria ENTEROBACTER CLOACAE  Final      Susceptibility   Enterobacter cloacae - MIC*    CEFAZOLIN >=64 RESISTANT Resistant     CEFEPIME <=0.12 SENSITIVE Sensitive     CEFTAZIDIME <=1 SENSITIVE Sensitive     CIPROFLOXACIN <=0.25 SENSITIVE Sensitive     GENTAMICIN <=1 SENSITIVE Sensitive     IMIPENEM <=0.25 SENSITIVE Sensitive     TRIMETH/SULFA <=20 SENSITIVE Sensitive     PIP/TAZO <=4 SENSITIVE Sensitive     * MODERATE ENTEROBACTER CLOACAE     Time coordinating discharge: 75mins  SIGNED:   Kathie Dike, MD  [facility] 03/08/2021, 7:09 PM   If 7PM-7AM, please contact night-coverage [email or web address]

## 2021-03-08 NOTE — Progress Notes (Signed)
5 Days Post-Op  Subjective: Patient feels fine.  Tolerating diet well.  No right upper quadrant abdominal pain or emesis noted.  Objective: Vital signs in last 24 hours: Temp:  [97.9 F (36.6 C)-98.2 F (36.8 C)] 98.2 F (36.8 C) (07/12 0406) Pulse Rate:  [80-94] 84 (07/12 0406) Resp:  [18-19] 19 (07/12 0406) BP: (136-151)/(69-86) 142/69 (07/12 0406) SpO2:  [100 %] 100 % (07/12 0406) Last BM Date: 03/07/21  Intake/Output from previous day: 07/11 0701 - 07/12 0700 In: 580 [P.O.:480; IV Piggyback:100] Out: -  Intake/Output this shift: No intake/output data recorded.  General appearance: alert, cooperative, and no distress GI: soft, non-tender; bowel sounds normal; no masses,  no organomegaly Extremities: Dressing intact.  Lab Results:  Recent Labs    03/06/21 0551 03/07/21 0606  WBC 12.8* 13.0*  HGB 9.2* 9.0*  HCT 29.2* 29.2*  PLT 486* 465*   BMET Recent Labs    03/06/21 0551 03/07/21 0606  NA 133* 139  K 4.1 4.2  CL 102 105  CO2 23 26  GLUCOSE 212* 93  BUN 18 21  CREATININE 1.25* 1.17*  CALCIUM 8.0* 8.7*   PT/INR No results for input(s): LABPROT, INR in the last 72 hours.  Studies/Results: MR FOOT RIGHT WO CONTRAST  Result Date: 03/07/2021 CLINICAL DATA:  Right foot infection. Recent fifth ray amputation for osteomyelitis. EXAM: MRI OF THE RIGHT ANKLE WITHOUT CONTRAST TECHNIQUE: Multiplanar, multisequence MR imaging of the right ankle was performed. No intravenous contrast was administered. COMPARISON:  Right foot x-rays dated March 01, 2021. FINDINGS: TENDONS Peroneal: Peroneal longus tendon intact. Peroneal brevis intact. Posteromedial: Posterior tibial tendon intact. Flexor digitorum longus tendon intact. Flexor hallucis longus tendon intact. Anterior: Tibialis anterior tendon intact. Extensor hallucis longus tendon intact Extensor digitorum longus tendons intact with fluid and gas in the proximal tendon sheaths (series 4, image 8; series 7, images 5-6).  Achilles:  Intact with mild distal tendinosis. Plantar Fascia: Intact. Thickening of the proximal central band. LIGAMENTS Lateral: Anterior talofibular ligament intact. Calcaneofibular ligament intact. Posterior talofibular ligament intact. Anterior and posterior tibiofibular ligaments intact. Medial: Deltoid ligament intact. Spring ligament intact. CARTILAGE Ankle Joint: No joint effusion. Normal ankle mortise. No chondral defect. Subtalar Joints/Sinus Tarsi: Normal subtalar joints. No subtalar joint effusion. Normal sinus tarsi. Bones: No marrow signal abnormality.  No fracture or dislocation. Soft Tissue: Bimalleolar soft tissue swelling. No soft tissue mass or fluid collection. IMPRESSION: 1. Extensor digitorum longus infectious tenosynovitis. 2. No evidence of osteomyelitis or septic arthritis. 3. Mild distal Achilles tendinosis.  Chronic mild plantar fasciitis. Electronically Signed   By: Titus Dubin M.D.   On: 03/07/2021 12:43   NM Hepato W/EF  Result Date: 03/07/2021 CLINICAL DATA:  Cholelithiasis, nausea, vomiting EXAM: NUCLEAR MEDICINE HEPATOBILIARY IMAGING WITH GALLBLADDER EF TECHNIQUE: Sequential images of the abdomen were obtained out to 60 minutes following intravenous administration of radiopharmaceutical. After slow intravenous infusion of 1.7 micrograms Cholecystokinin, gallbladder ejection fraction was determined. RADIOPHARMACEUTICALS:  5 mCi Tc-14m Choletec IV COMPARISON:  None. FINDINGS: Normal tracer extraction from bloodstream indicating normal hepatocellular function. Normal excretion of tracer into biliary tree. Gallbladder visualized at 27 min. Small bowel visualized at 15 min. No hepatic retention of tracer. Subjectively decrease emptying of tracer from gallbladder following CCK administration. Calculated gallbladder ejection fraction is -20%, indicating continued filling of the gallbladder during the post CCK period without emptying. Patient reported no symptoms following CCK  administration. Normal gallbladder ejection fraction following CCK stimulation is greater than 40% at 1 hour. IMPRESSION:  Patent biliary tree. No gallbladder response to CCK stimulation indicating abnormal gallbladder function. Electronically Signed   By: Lavonia Dana M.D.   On: 03/07/2021 12:55    Anti-infectives: Anti-infectives (From admission, onward)    Start     Dose/Rate Route Frequency Ordered Stop   03/04/21 0800  ceFEPIme (MAXIPIME) 2 g in sodium chloride 0.9 % 100 mL IVPB        2 g 200 mL/hr over 30 Minutes Intravenous Every 12 hours 03/03/21 1840     03/03/21 1930  ceFEPIme (MAXIPIME) 2 g in sodium chloride 0.9 % 100 mL IVPB        2 g 200 mL/hr over 30 Minutes Intravenous  Once 03/03/21 1833 03/03/21 1952   03/02/21 1800  vancomycin (VANCOCIN) IVPB 1000 mg/200 mL premix  Status:  Discontinued        1,000 mg 200 mL/hr over 60 Minutes Intravenous Every 24 hours 03/01/21 1630 03/02/21 0852   03/02/21 1800  vancomycin (VANCOREADY) IVPB 1250 mg/250 mL  Status:  Discontinued        1,250 mg 166.7 mL/hr over 90 Minutes Intravenous Every 24 hours 03/02/21 0852 03/05/21 1812   03/01/21 1800  metroNIDAZOLE (FLAGYL) IVPB 500 mg  Status:  Discontinued        500 mg 100 mL/hr over 60 Minutes Intravenous Every 8 hours 03/01/21 1647 03/03/21 1833   03/01/21 1730  vancomycin (VANCOREADY) IVPB 1500 mg/300 mL        1,500 mg 150 mL/hr over 120 Minutes Intravenous  Once 03/01/21 1624 03/01/21 1856   03/01/21 1715  vancomycin (VANCOCIN) IVPB 1000 mg/200 mL premix  Status:  Discontinued        1,000 mg 200 mL/hr over 60 Minutes Intravenous  Once 03/01/21 1622 03/01/21 1623       Assessment/Plan: s/p Procedure(s): AMPUTATION TOE Impression: Patient doing well, status post amputation of right fifth toe.  I did review the HIDA scans with Dr. Darcella Cheshire of radiology.  He states that bile is flowing freely in and out of the gallbladder.  How much it is functioning is variable.  As patient is  asymptomatic and her liver enzyme tests have improved, I will address her gallbladder as an outpatient.  She is okay for discharge from surgery standpoint.  She just needs iodoform Nu Gauze dressing to right fifth toe amputation site 3 times a week.  I will see patient in 2 weeks for follow-up.  We will continue Bactrim for 2 weeks.  Discussed with Dr. Roderic Palau.  LOS: 8 days    Aviva Signs 03/08/2021

## 2021-03-08 NOTE — Evaluation (Signed)
Physical Therapy Evaluation Patient Details Name: Nicole Golden MRN: [id number] DOB: 1961/08/16 Today's Date: 03/08/2021   History of Present Illness  Nicole Golden is a 60 y.o. female s/p  Right fifth toe amputation on 03/03/21 with history of diabetes mellitus type 2 poorly controlled with history of hypertension chronic kidney disease stage III and chronic lower extremity wound presents to the ER with complaints of persistent nausea vomiting for the last 4 days.  Also has been having some epigastric discomfort denies any blood in the vomitus denies any diarrhea.  Unable to keep anything but has taken her insulin last night.   Clinical Impression  Patient functioning near baseline for functional mobility and gait with good return for keeping bodyweight off right forefoot wearing post op shoe without loss of balance on level surfaces and going up/down steps using bilateral side rails.  Patient tolerated staying up in chair after therapy.  Patient will benefit from continued physical therapy in hospital and recommended venue below to increase strength, balance, endurance for safe ADLs and gait.       Follow Up Recommendations Home health PT    Equipment Recommendations  Rolling walker with 5" wheels    Recommendations for Other Services       Precautions / Restrictions Precautions Precautions: Fall Required Braces or Orthoses:  (post op shoe for right foot) Restrictions Weight Bearing Restrictions: Yes RLE Weight Bearing: Weight bearing as tolerated Other Position/Activity Restrictions: post op shoe for right foot      Mobility  Bed Mobility Overal bed mobility: Modified Independent                  Transfers Overall transfer level: Modified independent                  Ambulation/Gait Ambulation/Gait assistance: Supervision Gait Distance (Feet): 100 Feet Assistive device: Rolling walker (2 wheeled) Gait Pattern/deviations: Decreased step length -  right;Decreased stance time - right;Decreased stride length;Antalgic Gait velocity: decreased   General Gait Details: slightly labored cadence with good return for keeping body weight off right forefoot while wearing post op shoe, no loss of balance or c/o pain  Stairs Stairs: Yes Stairs assistance: Supervision;Modified independent (Device/Increase time) Stair Management: Two rails;Step to pattern Number of Stairs: 5 General stair comments: demonstrates good return for going up/down steps using bilateral side rails without loss of balance or c/o right foot pain  Wheelchair Mobility    Modified Rankin (Stroke Patients Only)       Balance Overall balance assessment: Needs assistance Sitting-balance support: Feet supported;No upper extremity supported Sitting balance-Leahy Scale: Good Sitting balance - Comments: seated at EOB   Standing balance support: During functional activity;Bilateral upper extremity supported Standing balance-Leahy Scale: Fair Standing balance comment: fair/good using RW                             Pertinent Vitals/Pain Pain Assessment: No/denies pain    Home Living Family/patient expects to be discharged to:: Private residence Living Arrangements: Alone Available Help at Discharge: Family;Available PRN/intermittently Type of Home: Apartment Home Access: Stairs to enter Entrance Stairs-Rails: Right;Left;Can reach both Entrance Stairs-Number of Steps: 5 Home Layout: One level Home Equipment: Walker - standard      Prior Function Level of Independence: Independent         Comments: community ambulator without AD, drives     Hand Dominance   Dominant Hand: Left    Extremity/Trunk  Assessment   Upper Extremity Assessment Upper Extremity Assessment: Overall WFL for tasks assessed    Lower Extremity Assessment Lower Extremity Assessment: Generalized weakness    Cervical / Trunk Assessment Cervical / Trunk Assessment: Normal   Communication   Communication: No difficulties  Cognition Arousal/Alertness: Awake/alert Behavior During Therapy: WFL for tasks assessed/performed Overall Cognitive Status: Within Functional Limits for tasks assessed                                        General Comments      Exercises     Assessment/Plan    PT Assessment Patient needs continued PT services  PT Problem List Decreased strength;Decreased activity tolerance;Decreased balance;Decreased mobility       PT Treatment Interventions DME instruction;Gait training;Stair training;Functional mobility training;Therapeutic activities;Therapeutic exercise;Patient/family education;Balance training    PT Goals (Current goals can be found in the Care Plan section)  Acute Rehab PT Goals Patient Stated Goal: return home with family to assist PT Goal Formulation: With patient Time For Goal Achievement: 03/11/21 Potential to Achieve Goals: Good    Frequency Min 3X/week   Barriers to discharge        Co-evaluation               AM-PAC PT "6 Clicks" Mobility  Outcome Measure Help needed turning from your back to your side while in a flat bed without using bedrails?: None Help needed moving from lying on your back to sitting on the side of a flat bed without using bedrails?: None Help needed moving to and from a bed to a chair (including a wheelchair)?: None Help needed standing up from a chair using your arms (e.g., wheelchair or bedside chair)?: None Help needed to walk in hospital room?: A Little Help needed climbing 3-5 steps with a railing? : A Little 6 Click Score: 22    End of Session   Activity Tolerance: Patient tolerated treatment well;Patient limited by fatigue Patient left: in chair;with call bell/phone within reach Nurse Communication: Mobility status PT Visit Diagnosis: Unsteadiness on feet (R26.81);Other abnormalities of gait and mobility (R26.89);Muscle weakness (generalized)  (M62.81)    Time: 7622-6333 PT Time Calculation (min) (ACUTE ONLY): 20 min   Charges:   PT Evaluation $PT Eval Moderate Complexity: 1 Mod PT Treatments $Therapeutic Activity: 8-22 mins        11:21 AM, 03/08/21 Lonell Grandchild, MPT Physical Therapist with [facility] [phone] office [phone] mobile phone

## 2021-03-08 NOTE — Care Management Important Message (Signed)
Important Message  Patient Details  Name: Nicole Golden MRN: [id number] Date of Birth: Sep 19, 1960   Medicare Important Message Given:  Yes     Tommy Medal 03/08/2021, 11:13 AM

## 2021-03-08 NOTE — Clinical Social Work Note (Signed)
Patient from home. Had amputation of right fifth toe. PT recommends HHPT and RW. Patient agreeable to [facility]. No preference in [facility] or DME agency. Referral made to and accepted by [facility] with [name]. RW ordered via Freda Munro at [place].    Nicole Golden, Clydene Pugh, LCSW

## 2021-03-10 NOTE — Progress Notes (Signed)
Patient called asking for the name of [place] [facility] she was referred to.  Writer provided company name  and telephone number.

## 2021-03-22 ENCOUNTER — Other Ambulatory Visit: Payer: Self-pay

## 2021-03-22 ENCOUNTER — Encounter: Payer: Self-pay | Admitting: General Surgery

## 2021-03-22 ENCOUNTER — Ambulatory Visit (INDEPENDENT_AMBULATORY_CARE_PROVIDER_SITE_OTHER): Payer: Medicare HMO | Admitting: General Surgery

## 2021-03-22 VITALS — BP 168/65 | HR 93 | Temp 98.7°F | Resp 18 | Ht 68.0 in | Wt 192.0 lb

## 2021-03-22 DIAGNOSIS — Z09 Encounter for follow-up examination after completed treatment for conditions other than malignant neoplasm: Secondary | ICD-10-CM

## 2021-03-22 NOTE — Progress Notes (Signed)
Subjective:     Nicole Golden  Here for follow-up of right fifth toe amputation.  Patient has been continuing her antibiotics by mouth.  She has been seen by home health 3 times a week for packing of the wound.  She denies any pain.  She denies any upper abdominal pain, nausea, or vomiting.  There was a question of whether she had biliary colic.  She states she is eating fine.  Her blood sugars are well controlled. Objective:    BP (!) 168/65   Pulse 93   Temp 98.7 F (37.1 C) (Other (Comment))   Resp 18   Ht '5\' 8"'$  (1.727 m)   Wt 192 lb (87.1 kg)   SpO2 96%   BMI 29.19 kg/m   General:  alert, cooperative, and no distress  Right fifth toe amputation site healing well by secondary intention.  No purulent drainage noted.  Granulation tissue was noted at the base.  Was cleaned with peroxide and covered with a dry dressing.     Assessment:    Doing well postoperatively.    Plan:   Will switch dressing changes to peroxide to the wound 3 days a week after washing with soap and water.  May stop packing.  No need for cholecystectomy at this time.  Follow-up here in 3 weeks.

## 2021-04-12 ENCOUNTER — Other Ambulatory Visit: Payer: Self-pay

## 2021-04-12 ENCOUNTER — Encounter: Payer: Self-pay | Admitting: General Surgery

## 2021-04-12 ENCOUNTER — Ambulatory Visit (INDEPENDENT_AMBULATORY_CARE_PROVIDER_SITE_OTHER): Payer: Medicare HMO | Admitting: General Surgery

## 2021-04-12 VITALS — BP 151/81 | HR 91 | Temp 98.2°F | Resp 12 | Ht 68.0 in | Wt 189.0 lb

## 2021-04-12 DIAGNOSIS — Z09 Encounter for follow-up examination after completed treatment for conditions other than malignant neoplasm: Secondary | ICD-10-CM

## 2021-04-13 NOTE — Progress Notes (Signed)
Subjective:     [place]  Here for postoperative visit, status post right fifth toe amputation.  Patient has been keeping the wound clean with saline.  Has no complaints. Objective:    BP (!) 151/81   Pulse 91   Temp 98.2 F (36.8 C) (Other (Comment))   Resp 12   Ht '5\' 8"'$  (1.727 m)   Wt 189 lb (85.7 kg)   SpO2 97%   BMI 28.74 kg/m   General:  alert, cooperative, and no distress  Right fifth toe amputation site healing well by secondary intention, almost closed over.  Granulation tissue present.  Silver nitrate applied.  No purulent drainage noted.     Assessment:    Doing well postoperatively.    Plan:   Keep wound clean with soap and water daily.  Follow-up here in 3 weeks for wound check.

## 2021-05-03 ENCOUNTER — Encounter: Payer: Medicare HMO | Admitting: General Surgery

## 2021-05-10 ENCOUNTER — Ambulatory Visit (INDEPENDENT_AMBULATORY_CARE_PROVIDER_SITE_OTHER): Payer: Medicare HMO | Admitting: General Surgery

## 2021-05-10 ENCOUNTER — Other Ambulatory Visit: Payer: Self-pay

## 2021-05-10 ENCOUNTER — Encounter: Payer: Self-pay | Admitting: General Surgery

## 2021-05-10 VITALS — BP 133/69 | HR 101 | Temp 98.4°F | Resp 16 | Ht 68.0 in | Wt 194.0 lb

## 2021-05-10 DIAGNOSIS — Z09 Encounter for follow-up examination after completed treatment for conditions other than malignant neoplasm: Secondary | ICD-10-CM

## 2021-05-10 NOTE — Progress Notes (Signed)
Subjective:     Nicole Golden  Here for follow-up, status post right fifth toe amputation.  She has been released by home health.  She has minimal yellowish drainage from the wound.  She denies any pain.   Objective:    BP 133/69   Pulse (!) 101   Temp 98.4 F (36.9 C) (Other (Comment))   Resp 16   Ht '5\' 8"'$  (1.727 m)   Wt 194 lb (88 kg)   SpO2 95%   BMI 29.50 kg/m   General:  alert, cooperative, and no distress  Right fifth toe amputation healed.     Assessment:    Doing well postoperatively.    Plan:   May wear normal shoes.  Follow-up with me as needed.

## 2021-05-26 NOTE — Progress Notes (Signed)
[facility] Note  05/30/2021     CHIEF COMPLAINT Patient presents for Retina Follow Up  HISTORY OF PRESENT ILLNESS: Nicole Golden is a 60 y.o. female who presents to the clinic today for:   HPI     Retina Follow Up   Patient presents with  Other.  In left eye.  Duration of 9 months.  Since onset it is stable.  I, the attending physician,  performed the HPI with the patient and updated documentation appropriately.        Comments   9 month follow up Pigment dispersion syndrome OS-  Vision appears stable OU.  At time OS is blurrier than others, other times she can see good for months.  Vision fluctuates.  Patient had pinky toe on Right foot amputated 03/03/2021.  She states Dr. Katy Fitch has her using Latanoprost BID OU, Cosopt qd OS, and Prednisolone qd OS. BS 106 this morning, A1C 10 from a 12.       Last edited by Bernarda Caffey, MD on 06/01/2021 10:37 PM.    Pt states she had to have her pinky toe amputated on her right foot a few months ago, she states her A1c went up to 12, but is now down to 10, she states her BG numbers are doing really well  Referring physician: [name], DO [place],  [place] [address]  HISTORICAL INFORMATION:   Selected notes from the MEDICAL RECORD NUMBER Referred by [facility] for DM eye exam   CURRENT MEDICATIONS: Current Outpatient Medications (Ophthalmic Drugs)  Medication Sig   latanoprost (XALATAN) 0.005 % ophthalmic solution Place 1 drop into both eyes daily.   atropine 1 % ophthalmic solution Place 1 drop into the left eye 2 (two) times daily. (Patient not taking: Reported on 05/30/2021)   dorzolamide-timolol (COSOPT) 22.3-6.8 MG/ML ophthalmic solution Place 1 drop into the left eye 2 (two) times daily.   prednisoLONE acetate (PRED FORTE) 1 % ophthalmic suspension Place 1 drop into the left eye 3 (three) times daily.   No current facility-administered medications for this visit. (Ophthalmic Drugs)    Current Outpatient Medications (Other)  Medication Sig   amLODipine (NORVASC) 10 MG tablet Take 1 tablet (10 mg total) by mouth daily.   aspirin EC 81 MG tablet Take 81 mg by mouth daily. Swallow whole.   famotidine (PEPCID) 40 MG tablet Take 40 mg by mouth 2 (two) times daily.   furosemide (LASIX) 20 MG tablet Take 20 mg by mouth 2 (two) times daily as needed for edema.   HYDROcodone-acetaminophen (NORCO/VICODIN) 5-325 MG tablet Take 1 tablet by mouth every 6 (six) hours as needed for severe pain.   LANTUS SOLOSTAR 100 UNIT/ML Solostar Pen Inject 40 Units into the skin 2 (two) times daily.   NOVOLOG FLEXPEN 100 UNIT/ML FlexPen Inject 10-16 Units into the skin 3 (three) times daily before meals.   ondansetron (ZOFRAN ODT) 4 MG disintegrating tablet Take 1 tablet (4 mg total) by mouth every 8 (eight) hours as needed for nausea or vomiting.   polyethylene glycol (MIRALAX / GLYCOLAX) 17 g packet Take 17 g by mouth 2 (two) times daily.   rosuvastatin (CRESTOR) 40 MG tablet Take 40 mg by mouth daily.   senna (SENOKOT) 8.6 MG TABS tablet Take 2 tablets (17.2 mg total) by mouth at bedtime.   ACCU-CHEK AVIVA PLUS test strip    hydrOXYzine (ATARAX/VISTARIL) 25 MG tablet Take 25 mg by mouth daily. (Patient not taking:  Reported on 05/30/2021)   Insulin Pen Needle (B-D ULTRAFINE III SHORT PEN) 31G X 8 MM MISC 1 each by Does not apply route as directed.   JARDIANCE 10 MG TABS tablet Take 10 mg by mouth daily. (Patient not taking: Reported on 05/30/2021)   No current facility-administered medications for this visit. (Other)   REVIEW OF SYSTEMS: ROS   Positive for: Musculoskeletal, Endocrine, Eyes Negative for: Constitutional, Gastrointestinal, Neurological, Skin, Genitourinary, HENT, Cardiovascular, Respiratory, Psychiatric, Allergic/Imm, Heme/Lymph Last edited by Leonie Douglas, COA on 05/30/2021  2:12 PM.     ALLERGIES No Known Allergies  PAST MEDICAL HISTORY Past Medical History:  Diagnosis Date    Diabetes mellitus without complication ([place])    Diabetic neuropathy ([place])    Diabetic neuropathy ([place])    Hypertension    Hypertensive retinopathy    OU   Past Surgical History:  Procedure Laterality Date   AMPUTATION TOE Right 03/03/2021   Procedure: AMPUTATION TOE;  Surgeon: Aviva Signs, MD;  Location: [facility];  Service: General;  Laterality: Right;   CATARACT EXTRACTION Bilateral    EYE SURGERY Bilateral    Cat Sx   FOOT SURGERY     right foot   TUBAL LIGATION     FAMILY HISTORY Family History  Problem Relation Age of Onset   Breast cancer Paternal Aunt    Diabetes Father    Colon cancer Neg Hx    Colon polyps Neg Hx    SOCIAL HISTORY Social History   Tobacco Use   Smoking status: Former   Smokeless tobacco: Never  Scientific laboratory technician Use: Never used  Substance Use Topics   Alcohol use: Yes    Comment: occas   Drug use: Never       OPHTHALMIC EXAM:  Base Eye Exam     Visual Acuity (Snellen - Linear)       Right Left   Dist [place] 20/20 20/50 +2   Dist ph [place]  20/40 +2         Tonometry (Tonopen, 2:22 PM)       Right Left   Pressure 17 15         Pupils       Dark Light Shape React APD   Right 3 2 Round Minimal None   Left 3 2 Round Minimal None         Visual Fields (Counting fingers)       Left Right    Full Full         Extraocular Movement       Right Left    Full Full         Neuro/Psych     Oriented x3: Yes   Mood/Affect: Normal         Dilation     Both eyes: 1.0% Mydriacyl, 2.5% Phenylephrine @ 2:22 PM           Slit Lamp and Fundus Exam     Slit Lamp Exam       Right Left   Lids/Lashes Dermatochalasis - upper lid Dermatochalasis - upper lid   Conjunctiva/Sclera Mild Melanosis Mild Melanosis   Cornea Arcus, well healed temporal cataract wounds, 1+Punctate epithelial erosions Mild Arcus, well healed temporal cataract wounds, trace Punctate epithelial erosions, +endopigment--Krukenberg spindle    Anterior Chamber Deep and quiet Deep and clear; no cell or pigment   Iris Round and dilated, No NVI Round and dilated, No NVI.  No TIDs.   Lens PC  IOL in good position PC IOL in good position, ?tilt, mild pigment deposition   Vitreous Mild Vitreous syneresis Mild Vitreous syneresis         Fundus Exam       Right Left   Disc Pallor, Sharp rim, +PPA/PPP, +SVP Mild pallor, Sharp rim, mild PPA, focal fibrosis @ 0600.   C/D Ratio 0.2 0.2   Macula Flat, Blunted foveal reflex, Retinal pigment epithelial mottling and clumping, scattered MA/IRH temporal macula, no edema Flat, blunted foveal reflex, Retinal pigment epithelial mottling, scattered focal DBH temporal macula.  No edema.   Vessels Vascular attenuation attenuated, Tortuous   Periphery Attached, mild reticular degeneration, Scattered MA/IRH, peripheral drusen Attached, scattered MA greatest temporal periphery, peripheral drusen           Refraction     Manifest Refraction       Sphere Cylinder Axis Dist VA   Right       Left -2.25 +0.50 150 20/30+1           IMAGING AND PROCEDURES  Imaging and Procedures for @TODAY @  OCT, Retina - OU - Both Eyes       Right Eye Quality was good. Central Foveal Thickness: 267. Progression has been stable. Findings include normal foveal contour, no IRF, no SRF, vitreomacular adhesion .   Left Eye Quality was good. Central Foveal Thickness: 278. Progression has been stable. Findings include normal foveal contour, no IRF, no SRF, vitreomacular adhesion (Partial PVD).   Notes *Images captured and stored on drive  Diagnosis / Impression:  NFP, no IRF/SRF OU No DME OU  Clinical management:  See below  Abbreviations: NFP - Normal foveal profile. CME - cystoid macular edema. PED - pigment epithelial detachment. IRF - intraretinal fluid. SRF - subretinal fluid. EZ - ellipsoid zone. ERM - epiretinal membrane. ORA - outer retinal atrophy. ORT - outer retinal tubulation. SRHM - subretinal  hyper-reflective material            ASSESSMENT/PLAN:    ICD-10-CM   1. Pigment dispersion syndrome of left eye  H21.232     2. Moderate nonproliferative diabetic retinopathy of both eyes without macular edema associated with type 2 diabetes mellitus ([place])  S50.5397     3. Retinal edema  H35.81 OCT, Retina - OU - Both Eyes    4. Essential hypertension  I10     5. Hypertensive retinopathy of both eyes  H35.033     6. Pseudophakia of both eyes  Z96.1     1. Pigment dispersion syndrome OS  - BCVA 20/30 OS today  - exam shows scattered fine pigment in [facility] and ant vit + Krukenberg spindle -- stable from prior  - IOL OS with mild tilt -- ?etiology of PDS  - IOP okay at 15  - discussed findings, prognosis             - Followed closely by Dr. Alfonso Patten. Katy Fitch  2,3. Moderate nonproliferative diabetic retinopathy w/o DME OU -- stable - The incidence, risk factors for progression, natural history and treatment options for diabetic retinopathy were discussed with patient.   - The need for close monitoring of blood glucose, blood pressure, and serum lipids, avoiding cigarette or any type of tobacco, and the need for long term follow up was also discussed with patient. - exam shows MA/IRH OD; DBH OS - OCT without diabetic macular edema - F/u in 3-4 mos, DFE, OCT, FA (transit OS)  4,5. Hypertensive retinopathy OU  - discussed importance  of tight BP control  - monitor  6. Pseudophakia OU  - s/p CE/IOL both eyes [facility])  - doing well  - ? Mild tilt of PCIOL OS  - monitor  Ophthalmic Meds Ordered this visit:  No orders of the defined types were placed in this encounter.     Return for f/u 3-4 months, NPDR OU, DFE, OCT, FA.  There are no Patient Instructions on file for this visit.   This document serves as a record of services personally performed by Gardiner Sleeper, MD, PhD. It was created on their behalf by Leeann Must, [place], an ophthalmic technician. The creation of this  record is the provider's dictation and/or activities during the visit.    Electronically signed by: Leeann Must, COA @TODAY @ 10:47 PM  This document serves as a record of services personally performed by Gardiner Sleeper, MD, PhD. It was created on their behalf by San Jetty. [place] Shark, OA an ophthalmic technician. The creation of this record is the provider's dictation and/or activities during the visit.    Electronically signed by: San Jetty. Marguerita Merles 10.03.2022 10:47 PM  Gardiner Sleeper, M.D., Ph.D. Diseases & Surgery of the Retina and [place] 05/30/2021   I have reviewed the above documentation for accuracy and completeness, and I agree with the above. Gardiner Sleeper, M.D., Ph.D. 06/01/21 10:47 PM   Abbreviations: M myopia (nearsighted); A astigmatism; H hyperopia (farsighted); P presbyopia; Mrx spectacle prescription;  CTL contact lenses; OD right eye; OS left eye; OU both eyes  XT exotropia; ET esotropia; PEK punctate epithelial keratitis; PEE punctate epithelial erosions; DES dry eye syndrome; MGD meibomian gland dysfunction; ATs artificial tears; PFAT's preservative free artificial tears; [place] nuclear sclerotic cataract; PSC posterior subcapsular cataract; ERM epi-retinal membrane; PVD posterior vitreous detachment; RD retinal detachment; DM diabetes mellitus; DR diabetic retinopathy; NPDR non-proliferative diabetic retinopathy; PDR proliferative diabetic retinopathy; CSME clinically significant macular edema; DME diabetic macular edema; dbh dot blot hemorrhages; CWS cotton wool spot; POAG primary open angle glaucoma; C/D cup-to-disc ratio; HVF humphrey visual field; GVF goldmann visual field; OCT optical coherence tomography; IOP intraocular pressure; BRVO Branch retinal vein occlusion; CRVO central retinal vein occlusion; CRAO central retinal artery occlusion; BRAO branch retinal artery occlusion; RT retinal tear; SB scleral buckle; PPV pars plana  vitrectomy; VH Vitreous hemorrhage; PRP panretinal laser photocoagulation; IVK intravitreal kenalog; VMT vitreomacular traction; MH Macular hole;  NVD neovascularization of the disc; NVE neovascularization elsewhere; AREDS age related eye disease study; ARMD age related macular degeneration; POAG primary open angle glaucoma; EBMD epithelial/anterior basement membrane dystrophy; ACIOL anterior chamber intraocular lens; IOL intraocular lens; PCIOL posterior chamber intraocular lens; Phaco/IOL phacoemulsification with intraocular lens placement; [place] photorefractive keratectomy; LASIK laser assisted in situ keratomileusis; HTN hypertension; DM diabetes mellitus; COPD chronic obstructive pulmonary disease

## 2021-05-30 ENCOUNTER — Ambulatory Visit (INDEPENDENT_AMBULATORY_CARE_PROVIDER_SITE_OTHER): Payer: Medicare HMO | Admitting: Ophthalmology

## 2021-05-30 ENCOUNTER — Other Ambulatory Visit: Payer: Self-pay

## 2021-05-30 DIAGNOSIS — I10 Essential (primary) hypertension: Secondary | ICD-10-CM

## 2021-05-30 DIAGNOSIS — E113393 Type 2 diabetes mellitus with moderate nonproliferative diabetic retinopathy without macular edema, bilateral: Secondary | ICD-10-CM

## 2021-05-30 DIAGNOSIS — H35033 Hypertensive retinopathy, bilateral: Secondary | ICD-10-CM | POA: Diagnosis not present

## 2021-05-30 DIAGNOSIS — H21232 Degeneration of iris (pigmentary), left eye: Secondary | ICD-10-CM | POA: Diagnosis not present

## 2021-05-30 DIAGNOSIS — H3581 Retinal edema: Secondary | ICD-10-CM

## 2021-05-30 DIAGNOSIS — Z961 Presence of intraocular lens: Secondary | ICD-10-CM

## 2021-05-30 DIAGNOSIS — E119 Type 2 diabetes mellitus without complications: Secondary | ICD-10-CM

## 2021-06-01 ENCOUNTER — Encounter (INDEPENDENT_AMBULATORY_CARE_PROVIDER_SITE_OTHER): Payer: Self-pay | Admitting: Ophthalmology

## 2021-06-01 LAB — COLOGUARD: COLOGUARD: POSITIVE — AB

## 2021-06-22 ENCOUNTER — Other Ambulatory Visit: Payer: Self-pay | Admitting: General Practice

## 2021-06-22 DIAGNOSIS — Z1231 Encounter for screening mammogram for malignant neoplasm of breast: Secondary | ICD-10-CM

## 2021-07-19 ENCOUNTER — Encounter: Payer: Self-pay | Admitting: Internal Medicine

## 2021-07-29 ENCOUNTER — Ambulatory Visit
Admission: RE | Admit: 2021-07-29 | Discharge: 2021-07-29 | Disposition: A | Discharge status: Home / Self Care | Payer: Medicare HMO | Source: Ambulatory Visit | Attending: General Practice | Admitting: General Practice

## 2021-07-29 DIAGNOSIS — Z1231 Encounter for screening mammogram for malignant neoplasm of breast: Secondary | ICD-10-CM

## 2021-07-29 IMAGING — MG MM DIGITAL SCREENING BILAT W/ TOMO AND CAD
8 series · 8 of 24 positions shown · non-contrast
Comparison: Previous exam(s).

CLINICAL DATA: Screening.

EXAM:
DIGITAL SCREENING BILATERAL MAMMOGRAM WITH TOMOSYNTHESIS AND CAD
TECHNIQUE: Bilateral screening digital craniocaudal and mediolateral oblique
mammograms were obtained. Bilateral screening digital breast
tomosynthesis was performed. The images were evaluated with
computer-aided detection.

[L CC synth-2D]
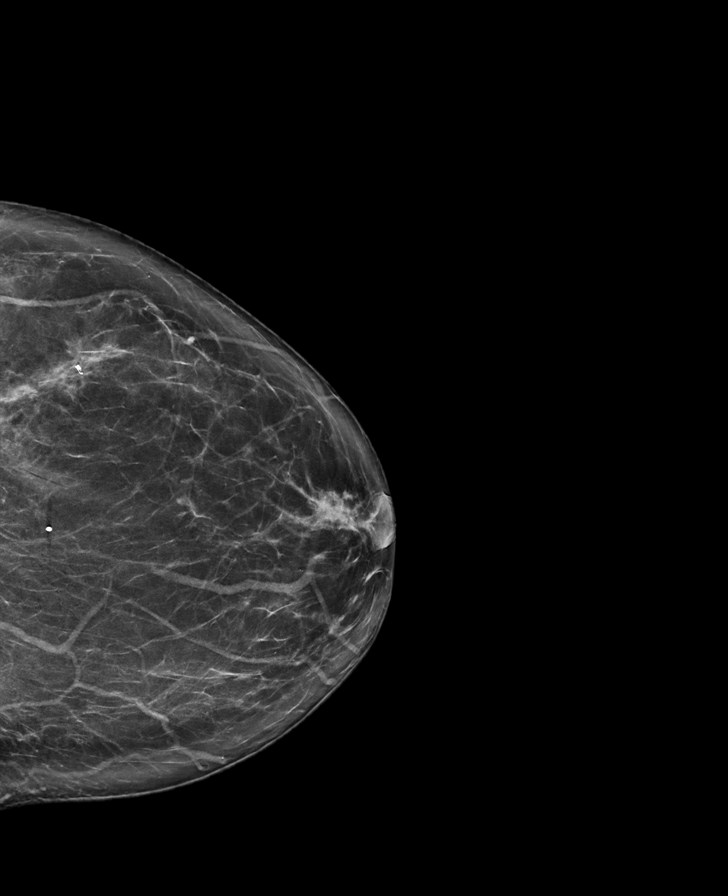

[L MLO synth-2D]
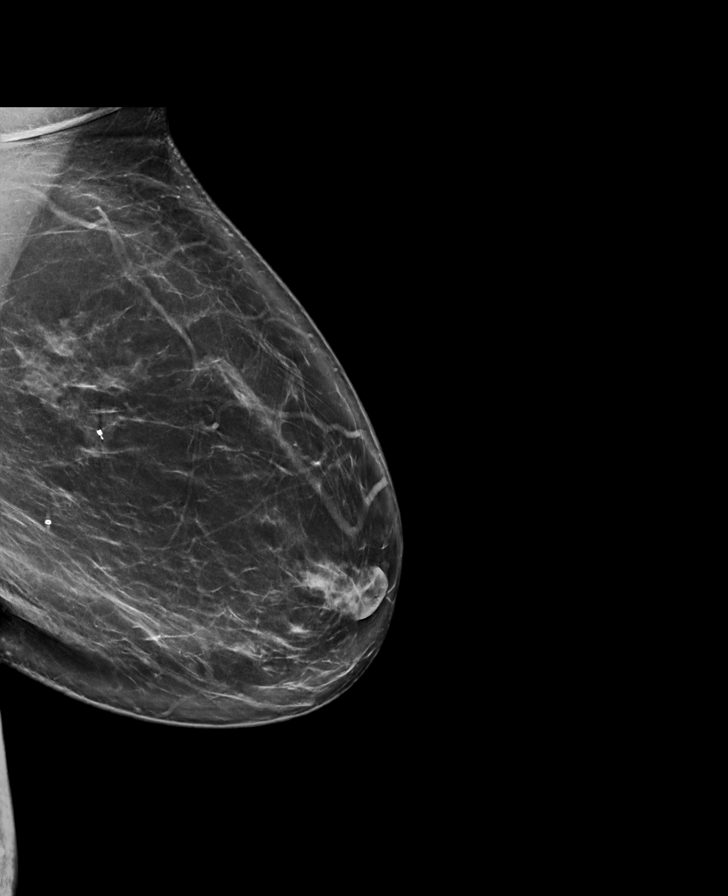

[R CC synth-2D]
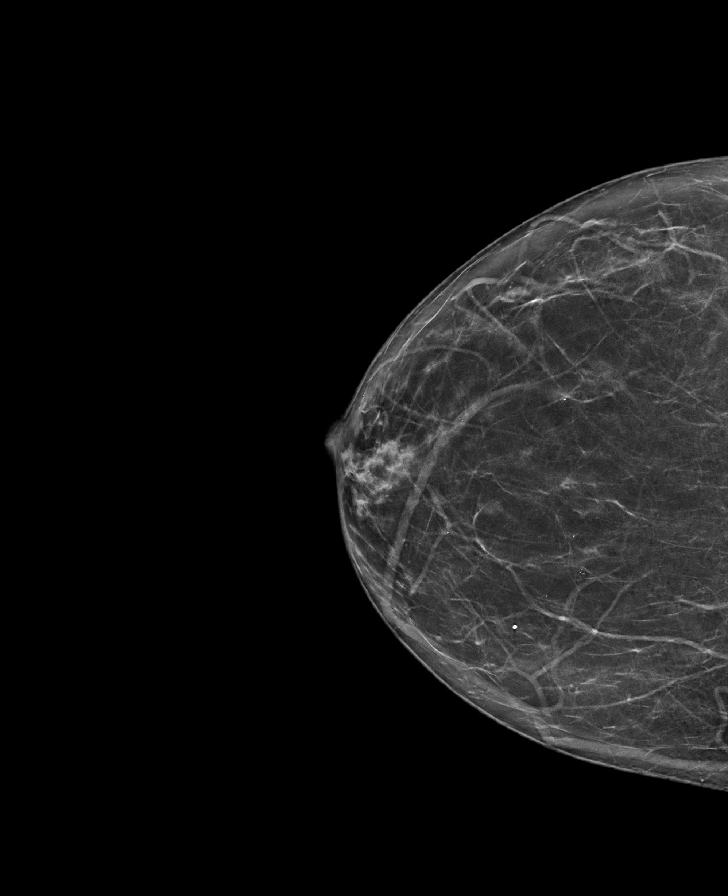

[R MLO synth-2D]
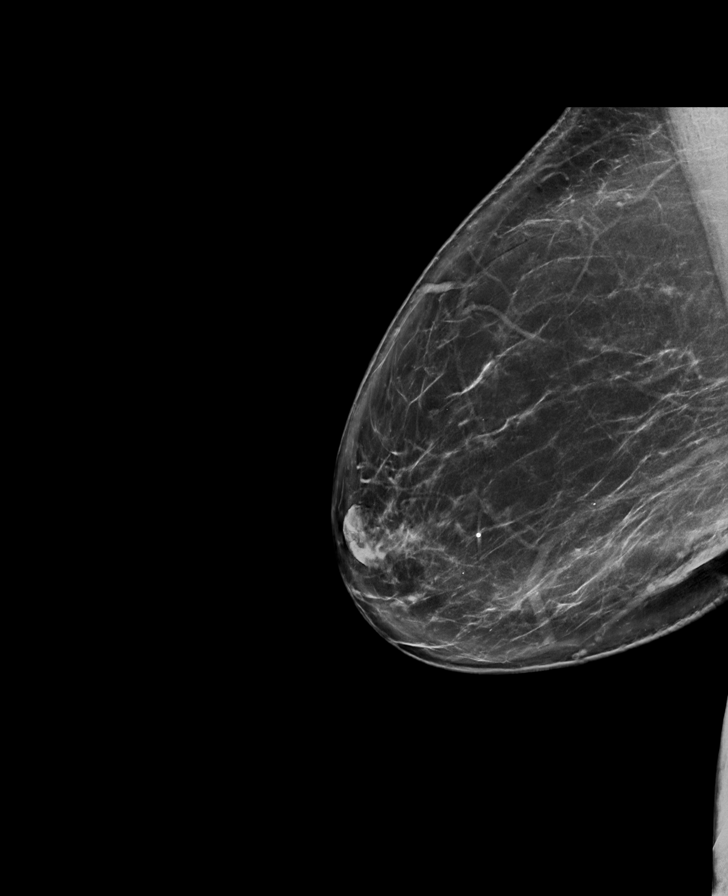

[R CC tomo · tomo slice 37/72.0]
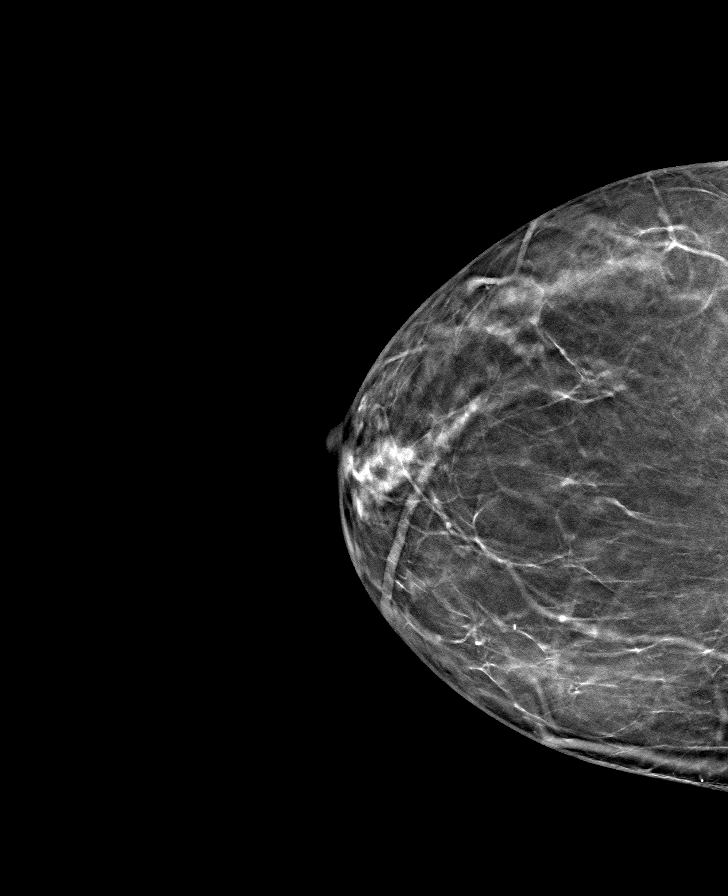

[L CC tomo · tomo slice 39/78.0]
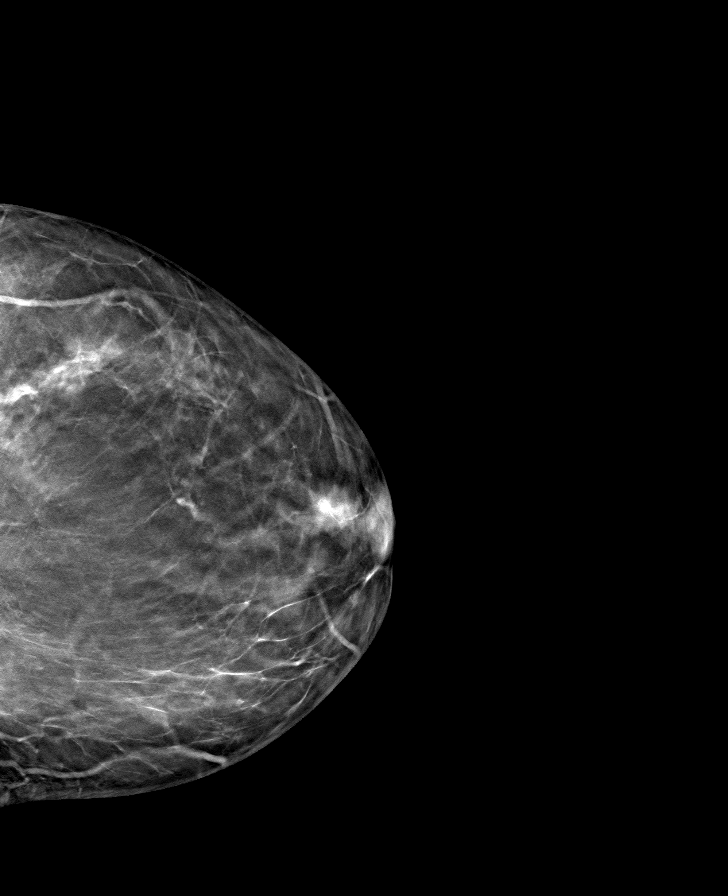

[L MLO tomo · tomo slice 47/94.0]
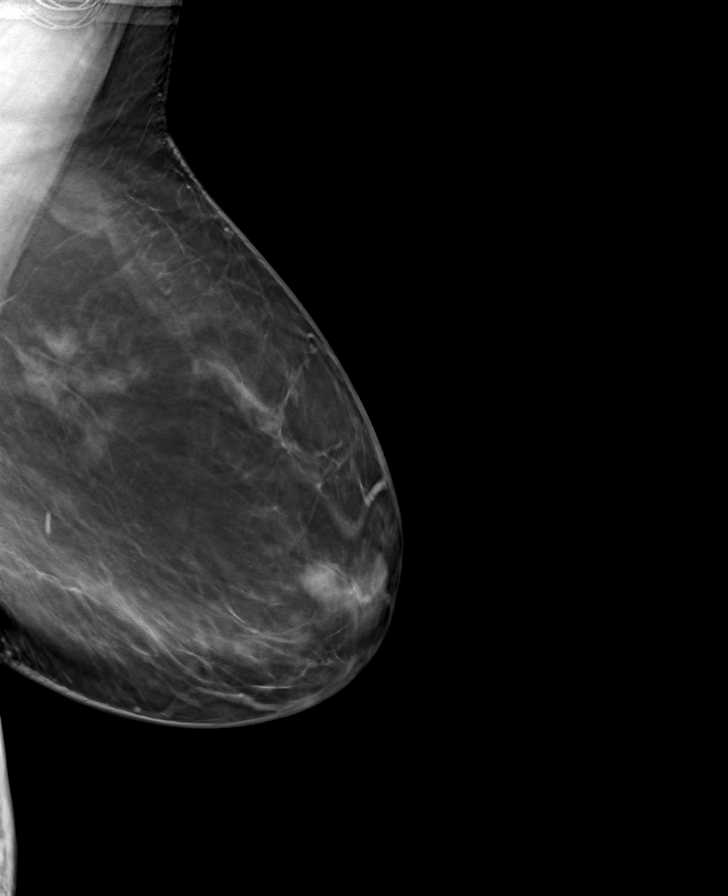

[R MLO tomo · tomo slice 45/90.0]
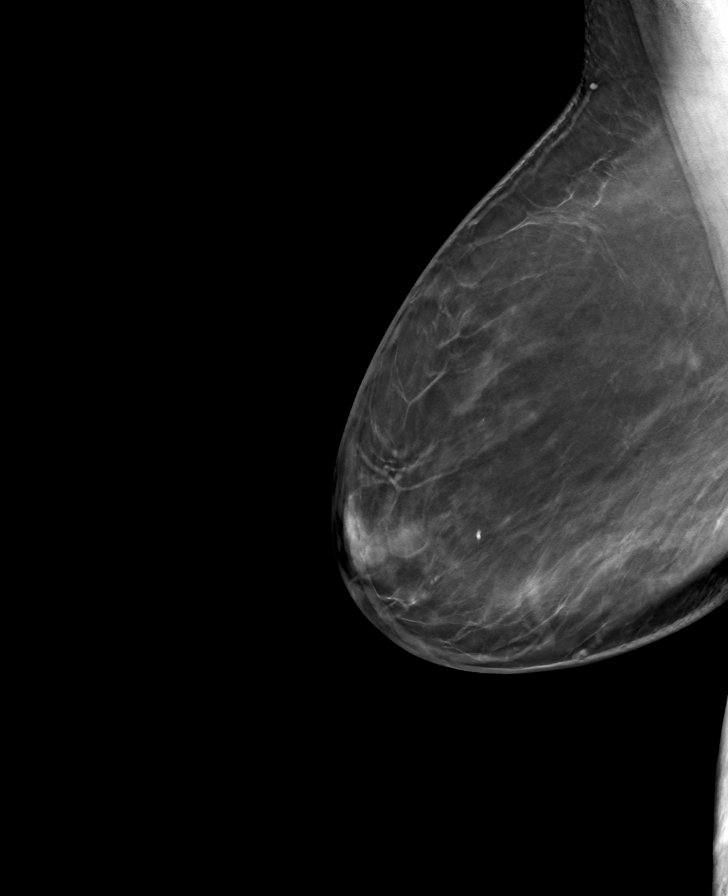

[8 of 24 positions shown; findings below may reference images not displayed]

ACR Breast Density Category b: There are scattered areas of
fibroglandular density.
FINDINGS: There are no findings suspicious for malignancy.
IMPRESSION: No mammographic evidence of malignancy. A result letter of this
screening mammogram will be mailed directly to the patient.

RECOMMENDATION:
Screening mammogram in one year. (Code:[BY])

BI-RADS CATEGORY  1: Negative.

## 2021-09-12 ENCOUNTER — Ambulatory Visit (INDEPENDENT_AMBULATORY_CARE_PROVIDER_SITE_OTHER): Payer: Self-pay | Admitting: *Deleted

## 2021-09-12 ENCOUNTER — Other Ambulatory Visit: Payer: Self-pay

## 2021-09-12 VITALS — Ht 68.0 in | Wt 193.0 lb

## 2021-09-12 DIAGNOSIS — Z1211 Encounter for screening for malignant neoplasm of colon: Secondary | ICD-10-CM

## 2021-09-12 NOTE — Progress Notes (Addendum)
Gastroenterology Pre-Procedure Review  Request Date: 09/12/2021 Requesting Physician: Arthur Holms, NP @ [facility], Last TCS done 10 or more years in [place], pt could not remember results from it  PATIENT REVIEW QUESTIONS: The patient responded to the following health history questions as indicated:    1. Diabetes Melitis: yes, type II 2. Joint replacements in the past 12 months: no 3. Major health problems in the past 3 months: no 4. Has an artificial valve or MVP: no 5. Has a defibrillator: no 6. Has been advised in past to take antibiotics in advance of a procedure like teeth cleaning: no 7. Family history of colon cancer: no  8. Alcohol Use: yes, 2 beers on holidays 9. Illicit drug Use: no 10. History of sleep apnea: no  11. History of coronary artery or other vascular stents placed within the last 12 months: no 12. History of any prior anesthesia complications: no 13. Body mass index is 29.35 kg/m.    MEDICATIONS & ALLERGIES:    Patient reports the following regarding taking any blood thinners:   Plavix? no Aspirin? Yes, 81 mg daily Coumadin? no Brilinta? no Xarelto? no Eliquis? no Pradaxa? no Savaysa? no Effient? no  Patient confirms/reports the following medications:  Current Outpatient Medications  Medication Sig Dispense Refill   aspirin EC 81 MG tablet Take 81 mg by mouth daily. Swallow whole.     atropine 1 % ophthalmic solution Place 1 drop into the left eye 2 (two) times daily.     dorzolamide-timolol (COSOPT) 22.3-6.8 MG/ML ophthalmic solution Place 1 drop into the left eye 2 (two) times daily.     LANTUS SOLOSTAR 100 UNIT/ML Solostar Pen Inject 40 Units into the skin 2 (two) times daily. 15 mL 11   latanoprost (XALATAN) 0.005 % ophthalmic solution Place 1 drop into both eyes daily.     NOVOLOG FLEXPEN 100 UNIT/ML FlexPen Inject 10-16 Units into the skin 3 (three) times daily before meals.     rosuvastatin (CRESTOR) 40 MG tablet Take 40 mg by mouth  daily.     No current facility-administered medications for this visit.    Patient confirms/reports the following allergies:  No Known Allergies  No orders of the defined types were placed in this encounter.   [place] INFORMATION Primary Insurance: [facility],  [place] #: [id number],  Group #: [id number] Pre-Cert / Josem Kaufmann required: Yes, approved online 02/27/9020-08/28/5518 Pre-Cert / Josem Kaufmann #: [id number]  Secondary Insurance: Medicaid [place] Access,  ID #: [id number] L Pre-Cert / Josem Kaufmann required: No, not required  SCHEDULE INFORMATION: Procedure has been scheduled as follows:  Date: 10/04/2021, Time: 11:00 Location: [facility] with Dr. Abbey Chatters  This Gastroenterology Pre-Precedure Review Form is being routed to the following provider(s): Roseanne Kaufman, NP

## 2021-09-13 NOTE — Progress Notes (Signed)
Tried to call pt.  Voice mail full and could not accept messages.

## 2021-09-13 NOTE — Progress Notes (Signed)
ASA 2. 1/2 dose Lantus evening prior. No insulin the morning of procedure.

## 2021-09-15 ENCOUNTER — Encounter: Payer: Self-pay | Admitting: *Deleted

## 2021-09-15 MED ORDER — PEG 3350-KCL-NA BICARB-NACL 420 G PO SOLR: 4000.0000 mL | Freq: Once | ORAL | 0 refills | Status: AC

## 2021-09-15 NOTE — Addendum Note (Signed)
Addended by: Metro Kung on: 09/15/2021 11:06 AM   Modules accepted: Orders

## 2021-09-15 NOTE — Progress Notes (Signed)
Spoke to pt.  Scheduled procedure for 10/04/2021 with arrival at 9:30.  Reviewed prep instructions with pt by telephone.  Informed pt of diabetes adjustments.  Pt aware that I am mailing out prep instructions along with diabetes medication adjustment letter.  Confirmed mailing address with pt.

## 2021-09-16 ENCOUNTER — Other Ambulatory Visit: Payer: Self-pay | Admitting: *Deleted

## 2021-10-03 ENCOUNTER — Encounter (INDEPENDENT_AMBULATORY_CARE_PROVIDER_SITE_OTHER): Payer: Medicare HMO | Admitting: Ophthalmology

## 2021-10-04 ENCOUNTER — Ambulatory Visit (HOSPITAL_COMMUNITY): Payer: Medicare HMO | Admitting: Anesthesiology

## 2021-10-04 ENCOUNTER — Encounter (HOSPITAL_COMMUNITY): Payer: Self-pay

## 2021-10-04 ENCOUNTER — Encounter (HOSPITAL_COMMUNITY)
Admission: RE | Disposition: A | Discharge status: Home / Self Care | Payer: Self-pay | Source: Home / Self Care | Attending: Internal Medicine

## 2021-10-04 ENCOUNTER — Other Ambulatory Visit: Payer: Self-pay

## 2021-10-04 ENCOUNTER — Ambulatory Visit (HOSPITAL_COMMUNITY)
Admission: RE | Admit: 2021-10-04 | Discharge: 2021-10-04 | Disposition: A | Discharge status: Home / Self Care | Payer: Medicare HMO | Attending: Internal Medicine | Admitting: Internal Medicine

## 2021-10-04 DIAGNOSIS — Z87891 Personal history of nicotine dependence: Secondary | ICD-10-CM | POA: Diagnosis not present

## 2021-10-04 DIAGNOSIS — Z1212 Encounter for screening for malignant neoplasm of rectum: Secondary | ICD-10-CM | POA: Diagnosis not present

## 2021-10-04 DIAGNOSIS — E1165 Type 2 diabetes mellitus with hyperglycemia: Secondary | ICD-10-CM | POA: Diagnosis not present

## 2021-10-04 DIAGNOSIS — R195 Other fecal abnormalities: Secondary | ICD-10-CM | POA: Diagnosis not present

## 2021-10-04 DIAGNOSIS — I10 Essential (primary) hypertension: Secondary | ICD-10-CM | POA: Insufficient documentation

## 2021-10-04 DIAGNOSIS — Z1211 Encounter for screening for malignant neoplasm of colon: Secondary | ICD-10-CM | POA: Insufficient documentation

## 2021-10-04 HISTORY — PX: COLONOSCOPY WITH PROPOFOL: SHX5780

## 2021-10-04 LAB — GLUCOSE, CAPILLARY: Glucose-Capillary: 266 mg/dL — ABNORMAL HIGH (ref 70–99)

## 2021-10-04 SURGERY — COLONOSCOPY WITH PROPOFOL
Anesthesia: General

## 2021-10-04 MED ORDER — PROPOFOL 10 MG/ML IV BOLUS
INTRAVENOUS | Status: DC | PRN
  Administered 2021-10-04: 100 mg via INTRAVENOUS
  Administered 2021-10-04: 60 mg via INTRAVENOUS
  Administered 2021-10-04: 40 mg via INTRAVENOUS

## 2021-10-04 MED ORDER — LACTATED RINGERS IV SOLN: INTRAVENOUS | Status: DC

## 2021-10-04 MED ORDER — LIDOCAINE HCL (CARDIAC) PF 100 MG/5ML IV SOSY
PREFILLED_SYRINGE | INTRAVENOUS | Status: DC | PRN
  Administered 2021-10-04: 50 mg via INTRAVENOUS

## 2021-10-04 NOTE — Op Note (Signed)
[facility] Patient Name: Nicole Golden Procedure Date: 10/04/2021 10:56 AM MRN: [id number] Date of Birth: Jan 21, 1961 Attending MD: Elon Alas. Abbey Chatters DO CSN: [id number] Age: 61 Admit Type: Outpatient Procedure:                Colonoscopy Indications:              Positive Cologuard test Providers:                Elon Alas. Abbey Chatters, DO, Janeece Riggers, RN, Hughie Closs RN, RN, Randa Spike, Technician Referring MD:              Medicines:                See the Anesthesia note for documentation of the                            administered medications Complications:            No immediate complications. Estimated Blood Loss:     Estimated blood loss: none. Procedure:                Pre-Anesthesia Assessment:                           - The anesthesia plan was to use monitored                            anesthesia care (MAC).                           After obtaining informed consent, the colonoscope                            was passed under direct vision. Throughout the                            procedure, the patient's blood pressure, pulse, and                            oxygen saturations were monitored continuously. The                            [id number]-HQ190L ([id number]) scope was introduced through                            the anus and advanced to the the cecum, identified                            by appendiceal orifice and ileocecal valve. The                            colonoscopy was performed without difficulty. The                            patient tolerated the procedure well.  The quality                            of the bowel preparation was evaluated using the                            BBPS [facility] Bowel Preparation Scale) with scores                            of: Right Colon = 1 (portion of mucosa seen, but                            other areas not well seen due to staining, residual                            stool and/or opaque  liquid), Transverse Colon = 1                            (portion of mucosa seen, but other areas not well                            seen due to staining, residual stool and/or opaque                            liquid) and Left Colon = 1 (portion of mucosa seen,                            but other areas not well seen due to staining,                            residual stool and/or opaque liquid). The total                            BBPS score equals 3. The quality of the bowel                            preparation was inadequate. Scope In: 10:59:51 AM Scope Out: 11:08:00 AM Scope Withdrawal Time: 0 hours 1 minute 19 seconds  Total Procedure Duration: 0 hours 8 minutes 9 seconds  Findings:      The perianal and digital rectal examinations were normal.      Extensive amounts of stool was found in the entire colon, precluding       visualization. Impression:               - Preparation of the colon was inadequate.                           - Stool in the entire examined colon.                           - No specimens collected. Moderate Sedation:      Per Anesthesia Care Recommendation:           - Patient has a contact  number available for                            emergencies. The signs and symptoms of potential                            delayed complications were discussed with the                            patient. Return to normal activities tomorrow.                            Written discharge instructions were provided to the                            patient.                           - Resume previous diet.                           - Continue present medications.                           - Repeat colonoscopy in 6 months because the bowel                            preparation was poor. Will need extended prep and 2                            days of clear liquids. Procedure Code(s):        --- Professional ---                           [phone], Colonoscopy, flexible;  diagnostic, including                            collection of specimen(s) by brushing or washing,                            when performed (separate procedure) Diagnosis Code(s):        --- Professional ---                           R19.5, Other fecal abnormalities CPT copyright 2019 American Medical Association. All rights reserved. The codes documented in this report are preliminary and upon coder review may  be revised to meet current compliance requirements. Elon Alas. Abbey Chatters, DO [place] Abbey Chatters, DO 10/04/2021 11:12:30 AM This report has been signed electronically. Number of Addenda: 0

## 2021-10-04 NOTE — Transfer of Care (Signed)
Immediate Anesthesia Transfer of Care Note  Patient: Nicole Golden  Procedure(s) Performed: COLONOSCOPY WITH PROPOFOL  Patient Location: Endoscopy Unit  Anesthesia Type:General  Level of Consciousness: drowsy  Airway & Oxygen Therapy: Patient Spontanous Breathing  Post-op Assessment: Report given to RN and Post -op Vital signs reviewed and stable  Post vital signs: Reviewed and stable  Last Vitals:  Vitals Value Taken Time  BP    Temp    Pulse    Resp    SpO2      Last Pain:  Vitals:   10/04/21 0949  TempSrc: Oral  PainSc: 0-No pain      Patients Stated Pain Goal: 7 (91/69/45 0388)  Complications: No notable events documented.

## 2021-10-04 NOTE — Anesthesia Postprocedure Evaluation (Signed)
Anesthesia Post Note  Patient: Nicole Golden  Procedure(s) Performed: COLONOSCOPY WITH PROPOFOL  Patient location during evaluation: Phase II Anesthesia Type: General Level of consciousness: awake Pain management: pain level controlled Vital Signs Assessment: post-procedure vital signs reviewed and stable Respiratory status: spontaneous breathing and respiratory function stable Cardiovascular status: blood pressure returned to baseline and stable Postop Assessment: no headache and no apparent nausea or vomiting Anesthetic complications: no Comments: Late entry   No notable events documented.   Last Vitals:  Vitals:   10/04/21 0949 10/04/21 1111  BP: (!) 153/63 (!) 106/54  Pulse: 82   Resp: 19 18  Temp: 36.7 C 36.4 C  SpO2: 100% 96%    Last Pain:  Vitals:   10/04/21 1111  TempSrc: Axillary  PainSc: 0-No pain                 Louann Sjogren

## 2021-10-04 NOTE — Anesthesia Preprocedure Evaluation (Signed)
Anesthesia Evaluation  Patient identified by MRN, date of birth, ID band Patient awake    Reviewed: Allergy & Precautions, H&P , NPO status , Patient's Chart, lab work & pertinent test results, reviewed documented beta blocker date and time   Airway Mallampati: II  TM Distance: >3 FB Neck ROM: full    Dental no notable dental hx.    Pulmonary neg pulmonary ROS, former smoker,    Pulmonary exam normal breath sounds clear to auscultation       Cardiovascular Exercise Tolerance: Good hypertension, negative cardio ROS   Rhythm:regular Rate:Normal     Neuro/Psych  Neuromuscular disease negative psych ROS   GI/Hepatic negative GI ROS, Neg liver ROS,   Endo/Other  negative endocrine ROSdiabetes, Poorly Controlled, Type 2  Renal/GU negative Renal ROS  negative genitourinary   Musculoskeletal   Abdominal   Peds  Hematology negative hematology ROS (+)   Anesthesia Other Findings   Reproductive/Obstetrics negative OB ROS                             Anesthesia Physical Anesthesia Plan  ASA: 3  Anesthesia Plan: General   Post-op Pain Management:    Induction:   PONV Risk Score and Plan: Propofol infusion  Airway Management Planned:   Additional Equipment:   Intra-op Plan:   Post-operative Plan:   Informed Consent: I have reviewed the patients History and Physical, chart, labs and discussed the procedure including the risks, benefits and alternatives for the proposed anesthesia with the patient or authorized representative who has indicated his/her understanding and acceptance.     Dental Advisory Given  Plan Discussed with: CRNA  Anesthesia Plan Comments:         Anesthesia Quick Evaluation

## 2021-10-04 NOTE — Anesthesia Procedure Notes (Signed)
Date/Time: 10/04/2021 11:00 AM Performed by: Orlie Dakin, CRNA Pre-anesthesia Checklist: Patient identified, Emergency Drugs available, Suction available and Patient being monitored Patient Re-evaluated:Patient Re-evaluated prior to induction Oxygen Delivery Method: Nasal cannula Induction Type: IV induction Placement Confirmation: positive ETCO2

## 2021-10-04 NOTE — Discharge Instructions (Addendum)
°  Colonoscopy Discharge Instructions  Read the instructions outlined below and refer to this sheet in the next few weeks. These discharge instructions provide you with general information on caring for yourself after you leave the hospital. Your doctor may also give you specific instructions. While your treatment has been planned according to the most current medical practices available, unavoidable complications occasionally occur.   ACTIVITY You may resume your regular activity, but move at a slower pace for the next 24 hours.  Take frequent rest periods for the next 24 hours.  Walking will help get rid of the air and reduce the bloated feeling in your belly (abdomen).  No driving for 24 hours (because of the medicine (anesthesia) used during the test).   Do not sign any important legal documents or operate any machinery for 24 hours (because of the anesthesia used during the test).  NUTRITION Drink plenty of fluids.  You may resume your normal diet as instructed by your doctor.  Begin with a light meal and progress to your normal diet. Heavy or fried foods are harder to digest and may make you feel sick to your stomach (nauseated).  Avoid alcoholic beverages for 24 hours or as instructed.  MEDICATIONS You may resume your normal medications unless your doctor tells you otherwise.  WHAT YOU CAN EXPECT TODAY Some feelings of bloating in the abdomen.  Passage of more gas than usual.  Spotting of blood in your stool or on the toilet paper.  IF YOU HAD POLYPS REMOVED DURING THE COLONOSCOPY: No aspirin products for 7 days or as instructed.  No alcohol for 7 days or as instructed.  Eat a soft diet for the next 24 hours.  FINDING OUT THE RESULTS OF YOUR TEST Not all test results are available during your visit. If your test results are not back during the visit, make an appointment with your caregiver to find out the results. Do not assume everything is normal if you have not heard from your  caregiver or the medical facility. It is important for you to follow up on all of your test results.  SEEK IMMEDIATE MEDICAL ATTENTION IF: You have more than a spotting of blood in your stool.  Your belly is swollen (abdominal distention).  You are nauseated or vomiting.  You have a temperature over 101.  You have abdominal pain or discomfort that is severe or gets worse throughout the day.   Unfortunately, your colon was not adequately prepped today for colonoscopy.  I did not see any large masses though I could have certainly missed polyps given the poor prep.  Would recommend repeat colonoscopy in 6 months with extended colon prep.  Appointment to discuss constipation and reschedule colonoscopy. I hope you have a great rest of your week!  Elon Alas. Abbey Chatters, D.O. Gastroenterology and Hepatology [facility]

## 2021-10-04 NOTE — H&P (Addendum)
Primary Care Physician:  [name], DO Primary Gastroenterologist:  Dr. Abbey Chatters  Pre-Procedure History & Physical: HPI:  Nicole Golden is a 61 y.o. female is here for positive cologuard.  Patient denies any family history of colorectal cancer.  No melena or hematochezia.  No abdominal pain or unintentional weight loss.  No change in bowel habits.  Overall feels well from a GI standpoint.  Past Medical History:  Diagnosis Date   Diabetes mellitus without complication (HCC)    Diabetic neuropathy ([place])    Diabetic neuropathy ([place])    Hypertension    Hypertensive retinopathy    OU    Past Surgical History:  Procedure Laterality Date   AMPUTATION TOE Right 03/03/2021   Procedure: AMPUTATION TOE;  Surgeon: Aviva Signs, MD;  Location: [facility];  Service: General;  Laterality: Right;   CATARACT EXTRACTION Bilateral    EYE SURGERY Bilateral    Cat Sx   FOOT SURGERY     right foot   TUBAL LIGATION      Prior to Admission medications   Medication Sig Start Date End Date Taking? Authorizing Provider  amLODipine (NORVASC) 10 MG tablet Take 10 mg by mouth in the morning.   Yes [provider]  aspirin EC 81 MG tablet Take 81 mg by mouth daily. Swallow whole.   Yes [provider]  Cholecalciferol (VITAMIN D3) 50 MCG (2000 UT) TABS Take 2,000 Units by mouth in the morning.   Yes [provider]  Cyanocobalamin (VITAMIN B-12 PO) Take 1 tablet by mouth in the morning. Gummy   Yes [provider]  dorzolamide-timolol (COSOPT) 22.3-6.8 MG/ML ophthalmic solution Place 1 drop into both eyes in the morning. 08/06/20  Yes [provider]  latanoprost (XALATAN) 0.005 % ophthalmic solution Place 1 drop into both eyes daily. 08/10/20  Yes [provider]  losartan (COZAAR) 100 MG tablet Take 100 mg by mouth in the morning. 06/23/21  Yes [provider]  MOUNJARO 2.5 MG/0.5ML Pen Inject 2.5 mg into the skin every Sunday. 08/16/21  Yes  [provider]  NOVOLOG FLEXPEN 100 UNIT/ML FlexPen Inject 15 Units into the skin 3 (three) times daily before meals. 03/04/20  Yes [provider]  prednisoLONE acetate (PRED FORTE) 1 % ophthalmic suspension Place 1 drop into both eyes in the morning.   Yes [provider]  rosuvastatin (CRESTOR) 40 MG tablet Take 40 mg by mouth in the morning.   Yes [provider]  LANTUS SOLOSTAR 100 UNIT/ML Solostar Pen Inject 40 Units into the skin 2 (two) times daily. Patient taking differently: Inject 60 Units into the skin 2 (two) times daily. 03/08/21   Kathie Dike, MD    Allergies as of 09/15/2021   (No Known Allergies)    Family History  Problem Relation Age of Onset   Breast cancer Paternal Aunt    Diabetes Father    Colon cancer Neg Hx    Colon polyps Neg Hx     Social History   Socioeconomic History   Marital status: Single    Spouse name: Not on file   Number of children: Not on file   Years of education: Not on file   Highest education level: Not on file  Occupational History   Occupation: [employer]    Comment: in [place], first shift   Tobacco Use   Smoking status: Former   Smokeless tobacco: Never  Scientific laboratory technician Use: Never used  Substance and Sexual Activity  Alcohol use: Yes    Comment: occas   Drug use: Never   Sexual activity: Not on file  Other Topics Concern   Not on file  Social History Narrative   Not on file   Social Determinants of Health   Financial Resource Strain: Not on file  Food Insecurity: Not on file  Transportation Needs: Not on file  Physical Activity: Not on file  Stress: Not on file  Social Connections: Not on file  Intimate Partner Violence: Not on file    Review of Systems: See HPI, otherwise negative ROS  Physical Exam: Vital signs in last 24 hours: Temp:  [98.1 F (36.7 C)] 98.1 F (36.7 C) (02/07 0949) Pulse Rate:  [82] 82 (02/07 0949) Resp:  [19] 19 (02/07 0949) BP: (153)/(63)  153/63 (02/07 0949) SpO2:  [100 %] 100 % (02/07 0949) Weight:  [87.5 kg] 87.5 kg (02/07 0949)   General:   Alert,  Well-developed, well-nourished, pleasant and cooperative in NAD Head:  Normocephalic and atraumatic. Eyes:  Sclera clear, no icterus.   Conjunctiva pink. Ears:  Normal auditory acuity. Nose:  No deformity, discharge,  or lesions. Mouth:  No deformity or lesions, dentition normal. Neck:  Supple; no masses or thyromegaly. Lungs:  Clear throughout to auscultation.   No wheezes, crackles, or rhonchi. No acute distress. Heart:  Regular rate and rhythm; no murmurs, clicks, rubs,  or gallops. Abdomen:  Soft, nontender and nondistended. No masses, hepatosplenomegaly or hernias noted. Normal bowel sounds, without guarding, and without rebound.   Msk:  Symmetrical without gross deformities. Normal posture. Extremities:  Without clubbing or edema. Neurologic:  Alert and  oriented x4;  grossly normal neurologically. Skin:  Intact without significant lesions or rashes. Cervical Nodes:  No significant cervical adenopathy. Psych:  Alert and cooperative. Normal mood and affect.  Impression/Plan: Nicole Golden is here for a colonoscopy to be performed for positive cologuard  The risks of the procedure including infection, bleed, or perforation as well as benefits, limitations, alternatives and imponderables have been reviewed with the patient. Questions have been answered. All parties agreeable.

## 2021-10-05 ENCOUNTER — Telehealth: Payer: Self-pay

## 2021-10-05 ENCOUNTER — Telehealth: Payer: Self-pay | Admitting: Internal Medicine

## 2021-10-05 ENCOUNTER — Other Ambulatory Visit: Payer: Self-pay | Admitting: Internal Medicine

## 2021-10-05 DIAGNOSIS — K581 Irritable bowel syndrome with constipation: Secondary | ICD-10-CM

## 2021-10-05 MED ORDER — LUBIPROSTONE 24 MCG PO CAPS: 24.0000 ug | ORAL_CAPSULE | Freq: Two times a day (BID) | ORAL | 5 refills | Status: DC

## 2021-10-05 NOTE — Telephone Encounter (Signed)
Patient's constipation not well controlled on Linzess 290 mcg daily, will switch her to Amitiza.  Please call and let her know.  Sending prescription now.  Thank you

## 2021-10-05 NOTE — Telephone Encounter (Signed)
Pt LMOVM yesterday stating that you had wanted her to call us back regarding her Linzess. Pt states she takes Linzess 290 mcg.

## 2021-10-05 NOTE — Telephone Encounter (Signed)
Phoned and advised the pt since her constipation is not controlled on Linzess 290 mcg he sent in Amitiza to her pharmacy in [place]. Pt expressed understanding.

## 2021-10-06 ENCOUNTER — Encounter (HOSPITAL_COMMUNITY): Payer: Self-pay | Admitting: Internal Medicine

## 2021-10-06 ENCOUNTER — Other Ambulatory Visit: Payer: Self-pay | Admitting: Internal Medicine

## 2021-10-06 ENCOUNTER — Telehealth: Payer: Self-pay | Admitting: Internal Medicine

## 2021-10-06 MED ORDER — TRULANCE 3 MG PO TABS
3.0000 mg | ORAL_TABLET | Freq: Every day | ORAL | 5 refills | Status: DC
Start: 2021-10-06 — End: 2022-02-15

## 2021-10-06 NOTE — Telephone Encounter (Signed)
noted 

## 2021-10-06 NOTE — Telephone Encounter (Signed)
Agree Movantik would not be appropriate in this patient as she is not on chronic opioids.  I will send in [employer] to see if this is covered.  Thank you

## 2021-10-06 NOTE — Telephone Encounter (Signed)
I see where the pharmacy recommended 2 different medications at the bottom of this Rx. Do you want me to proceed with this request or do you want to change it. Please advise

## 2021-10-07 NOTE — Progress Notes (Signed)
[facility] Note  10/10/2021     CHIEF COMPLAINT Patient presents for Retina Follow Up   HISTORY OF PRESENT ILLNESS: Nicole Golden is a 61 y.o. 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  Severity is moderate.  Duration of 4 months.  Since onset it is stable.  I, the attending physician,  performed the HPI with the patient and updated documentation appropriately.        Comments   Patient states vision the same OU. BS has improved since adding mounjarno. Last a1c was 10.5. On latanoprost qhs OS, cosopt daily OS, and pred forte daily OS.      Last edited by Bernarda Caffey, MD on 10/11/2021 12:08 AM.     Pt states   Referring physician: [name], DO [place],  [place] [address]  HISTORICAL INFORMATION:   Selected notes from the MEDICAL RECORD NUMBER Referred by [facility] for DM eye exam   CURRENT MEDICATIONS: Current Outpatient Medications (Ophthalmic Drugs)  Medication Sig   dorzolamide-timolol (COSOPT) 22.3-6.8 MG/ML ophthalmic solution Place 1 drop into both eyes in the morning.   latanoprost (XALATAN) 0.005 % ophthalmic solution Place 1 drop into both eyes daily.   prednisoLONE acetate (PRED FORTE) 1 % ophthalmic suspension Place 1 drop into both eyes in the morning.   No current facility-administered medications for this visit. (Ophthalmic Drugs)   Current Outpatient Medications (Other)  Medication Sig   amLODipine (NORVASC) 10 MG tablet Take 10 mg by mouth in the morning.   aspirin EC 81 MG tablet Take 81 mg by mouth daily. Swallow whole.   Cholecalciferol (VITAMIN D3) 50 MCG (2000 UT) TABS Take 2,000 Units by mouth in the morning.   Cyanocobalamin (VITAMIN B-12 PO) Take 1 tablet by mouth in the morning. Gummy   LANTUS SOLOSTAR 100 UNIT/ML Solostar Pen Inject 40 Units into the skin 2 (two) times daily. (Patient taking differently:  Inject 60 Units into the skin 2 (two) times daily.)   losartan (COZAAR) 100 MG tablet Take 100 mg by mouth in the morning.   MOUNJARO 2.5 MG/0.5ML Pen Inject 2.5 mg into the skin every Sunday.   MOUNJARO 5 MG/0.5ML Pen Inject into the skin.   NOVOLOG FLEXPEN 100 UNIT/ML FlexPen Inject 15 Units into the skin 3 (three) times daily before meals.   Plecanatide (TRULANCE) 3 MG TABS Take 3 mg by mouth daily.   rosuvastatin (CRESTOR) 40 MG tablet Take 40 mg by mouth in the morning.   TRADJENTA 5 MG TABS tablet Take 5 mg by mouth daily.   No current facility-administered medications for this visit. (Other)   REVIEW OF SYSTEMS: ROS   Positive for: Musculoskeletal, Endocrine, Eyes Negative for: Constitutional, Gastrointestinal, Neurological, Skin, Genitourinary, HENT, Cardiovascular, Respiratory, Psychiatric, Allergic/Imm, Heme/Lymph Last edited by Roselee Nova D, COT on 10/10/2021  2:05 PM.     ALLERGIES Allergies  Allergen Reactions   No [employer]alt[employer] Food Allergies Itching and Rash    All fruits including tomatoes   PAST MEDICAL HISTORY Past Medical History:  Diagnosis Date   Diabetes mellitus without complication ([place])    Diabetic neuropathy ([place])    Diabetic neuropathy ([place])    Hypertension    Hypertensive retinopathy    OU   Past Surgical History:  Procedure Laterality Date   AMPUTATION TOE Right 03/03/2021   Procedure: AMPUTATION TOE;  Surgeon: Aviva Signs, MD;  Location: [facility];  Service: General;  Laterality: Right;   CATARACT EXTRACTION Bilateral    COLONOSCOPY WITH PROPOFOL N/A 10/04/2021   Procedure: COLONOSCOPY WITH PROPOFOL;  Surgeon: [name], DO;  Location: [facility];  Service: Endoscopy;  Laterality: N/A;  11:00 / ASA 2   EYE SURGERY Bilateral    Cat Sx   FOOT SURGERY     right foot   TUBAL LIGATION     FAMILY HISTORY Family History  Problem Relation Age of Onset   Breast cancer Paternal Aunt    Diabetes Father    Colon cancer Neg Hx    Colon  polyps Neg Hx    SOCIAL HISTORY Social History   Tobacco Use   Smoking status: Former   Smokeless tobacco: Never  Scientific laboratory technician Use: Never used  Substance Use Topics   Alcohol use: Yes    Comment: occas   Drug use: Never       OPHTHALMIC EXAM:  Base Eye Exam     Visual Acuity (Snellen - Linear)       Right Left   Dist [place] 20/20 -2 20/40   Dist ph [place]  20/40 +2         Tonometry (Tonopen, 2:18 PM)       Right Left   Pressure 18 18         Pupils       Dark Light Shape React APD   Right 3 2 Round Minimal None   Left 3 2 Round Minimal None         Visual Fields (Counting fingers)       Left Right    Full Full         Extraocular Movement       Right Left    Full, Ortho Full, Ortho         Neuro/Psych     Oriented x3: Yes   Mood/Affect: Normal         Dilation     Both eyes: 2.5% Phenylephrine, 1.0% Mydriacyl @ 2:18 PM           Slit Lamp and Fundus Exam     Slit Lamp Exam       Right Left   Lids/Lashes Dermatochalasis - upper lid Dermatochalasis - upper lid   Conjunctiva/Sclera Mild Melanosis Mild Melanosis   Cornea Arcus, well healed temporal cataract wounds, trace Punctate epithelial erosions Mild Arcus, well healed temporal cataract wounds, trace Punctate epithelial erosions, +endopigment--Krukenberg spindle -- very mild   Anterior Chamber Deep and quiet Deep and clear; no cell or pigment   Iris Round and dilated, No NVI Round and dilated, No NVI.  No TIDs.   Lens PC IOL in good position PC IOL in good position, ?tilt, mild pigment deposition   Anterior Vitreous Mild Vitreous syneresis Mild Vitreous syneresis         Fundus Exam       Right Left   Disc Pallor, Sharp rim, +PPA/PPP, +SVP Mild pallor, Sharp rim, mild PPA, focal fibrosis @ 0600.   C/D Ratio 0.2 0.2   Macula Flat, Blunted foveal reflex, Retinal pigment epithelial mottling and clumping, scattered MA/IRH temporal macula, no edema Flat, blunted foveal  reflex, Retinal pigment epithelial mottling, scattered focal DBH temporal macula.  No edema.   Vessels Vascular attenuation attenuated, Tortuous   Periphery Attached, mild reticular degeneration, Scattered MA/IRH, peripheral drusen Attached, scattered MA/DBH greatest temporal  periphery, peripheral drusen           IMAGING AND PROCEDURES  Imaging and Procedures for @TODAY @  OCT, Retina - OU - Both Eyes       Right Eye Quality was good. Central Foveal Thickness: 263. Progression has been stable. Findings include normal foveal contour, no IRF, no SRF, vitreomacular adhesion .   Left Eye Quality was good. Central Foveal Thickness: 274. Progression has been stable. Findings include normal foveal contour, no IRF, no SRF, vitreomacular adhesion (Partial PVD).   Notes *Images captured and stored on drive  Diagnosis / Impression:  NFP, no IRF/SRF OU No DME OU  Clinical management:  See below  Abbreviations: NFP - Normal foveal profile. CME - cystoid macular edema. PED - pigment epithelial detachment. IRF - intraretinal fluid. SRF - subretinal fluid. EZ - ellipsoid zone. ERM - epiretinal membrane. ORA - outer retinal atrophy. ORT - outer retinal tubulation. SRHM - subretinal hyper-reflective material      Fluorescein Angiography Optos (Transit OS)       Right Eye Progression has no prior data. Early phase findings include microaneurysm. Mid/Late phase findings include microaneurysm, leakage (No NV, mild peripheral leakage greatest temporal periphery ).   Left Eye Progression has no prior data. Early phase findings include microaneurysm. Mid/Late phase findings include leakage, microaneurysm (No NV, mild peripheral leakage).   Notes **Images stored on drive**  Impression: Moderate NPDR OU - late leaking MA OU - no NV OU           ASSESSMENT/PLAN:    ICD-10-CM   1. Pigment dispersion syndrome of left eye  H21.232     2. Moderate nonproliferative diabetic retinopathy  of both eyes without macular edema associated with type 2 diabetes mellitus (HCC)  B63.8453 OCT, Retina - OU - Both Eyes    Fluorescein Angiography Optos (Transit OS)    3. Essential hypertension  I10     4. Hypertensive retinopathy of both eyes  H35.033 Fluorescein Angiography Optos (Transit OS)    5. Pseudophakia of both eyes  Z96.1      1. Pigment dispersion syndrome OS  - BCVA 20/40 OS today  - exam shows scattered fine pigment in [facility] and ant vit + Krukenberg spindle -- stable from prior  - IOL OS with mild tilt -- ?etiology of PDS  - IOP okay at 18  - continue drops as directed by Dr. Katy Fitch    Prednisolone qd OS, Latanoprost qhs OS, Cosopt qd OS  - discussed findings, prognosis             - monitor  2. Moderate nonproliferative diabetic retinopathy w/o DME OU -- stable - The incidence, risk factors for progression, natural history and treatment options for diabetic retinopathy were discussed with patient.   - The need for close monitoring of blood glucose, blood pressure, and serum lipids, avoiding cigarette or any type of tobacco, and the need for long term follow up was also discussed with patient. - A1C decreased to 10.4 per pt report - FA 02.13.23- late leaking MA OU, no NV OU - exam shows scattered MA/IRH OU - OCT without diabetic macular edema OU - f/u in 6 months DFE, OCT  3,4. Hypertensive retinopathy OU  - discussed importance of tight BP control  - monitor  5. Pseudophakia OU  - s/p CE/IOL both eyes [facility])  - doing well  - ? Mild tilt of PCIOL OS  - monitor  Ophthalmic Meds Ordered this visit:  No orders of the defined types were placed in this encounter.      Return in about 6 months (around 04/09/2022) for DFE, OCT.  There are no Patient Instructions on file for this visit.   This document serves as a record of services personally performed by Gardiner Sleeper, MD, PhD. It was created on their behalf by Roselee Nova, COMT. The creation of this  record is the provider's dictation and/or activities during the visit.  Electronically signed by: Roselee Nova, COMT 10/11/21 12:10 AM  This document serves as a record of services personally performed by Gardiner Sleeper, MD, PhD. It was created on their behalf by Leonie Douglas, an ophthalmic technician. The creation of this record is the provider's dictation and/or activities during the visit.    Electronically signed by: Leonie Douglas COA, 10/11/21  12:10 AM   Gardiner Sleeper, M.D., Ph.D. Diseases & Surgery of the Retina and [place] 10/10/2021  I have reviewed the above documentation for accuracy and completeness, and I agree with the above. Gardiner Sleeper, M.D., Ph.D. 10/11/21 12:15 AM   Abbreviations: M myopia (nearsighted); A astigmatism; H hyperopia (farsighted); P presbyopia; Mrx spectacle prescription;  CTL contact lenses; OD right eye; OS left eye; OU both eyes  XT exotropia; ET esotropia; PEK punctate epithelial keratitis; PEE punctate epithelial erosions; DES dry eye syndrome; MGD meibomian gland dysfunction; ATs artificial tears; PFAT's preservative free artificial tears; [place] nuclear sclerotic cataract; PSC posterior subcapsular cataract; ERM epi-retinal membrane; PVD posterior vitreous detachment; RD retinal detachment; DM diabetes mellitus; DR diabetic retinopathy; NPDR non-proliferative diabetic retinopathy; PDR proliferative diabetic retinopathy; CSME clinically significant macular edema; DME diabetic macular edema; dbh dot blot hemorrhages; CWS cotton wool spot; POAG primary open angle glaucoma; C/D cup-to-disc ratio; HVF humphrey visual field; GVF goldmann visual field; OCT optical coherence tomography; IOP intraocular pressure; BRVO Branch retinal vein occlusion; CRVO central retinal vein occlusion; CRAO central retinal artery occlusion; BRAO branch retinal artery occlusion; RT retinal tear; SB scleral buckle; PPV pars plana vitrectomy; VH  Vitreous hemorrhage; PRP panretinal laser photocoagulation; IVK intravitreal kenalog; VMT vitreomacular traction; MH Macular hole;  NVD neovascularization of the disc; NVE neovascularization elsewhere; AREDS age related eye disease study; ARMD age related macular degeneration; POAG primary open angle glaucoma; EBMD epithelial/anterior basement membrane dystrophy; ACIOL anterior chamber intraocular lens; IOL intraocular lens; PCIOL posterior chamber intraocular lens; Phaco/IOL phacoemulsification with intraocular lens placement; [place] photorefractive keratectomy; LASIK laser assisted in situ keratomileusis; HTN hypertension; DM diabetes mellitus; COPD chronic obstructive pulmonary disease

## 2021-10-10 ENCOUNTER — Other Ambulatory Visit: Payer: Self-pay

## 2021-10-10 ENCOUNTER — Encounter (INDEPENDENT_AMBULATORY_CARE_PROVIDER_SITE_OTHER): Payer: Self-pay | Admitting: Ophthalmology

## 2021-10-10 ENCOUNTER — Ambulatory Visit (INDEPENDENT_AMBULATORY_CARE_PROVIDER_SITE_OTHER): Payer: Medicare HMO | Admitting: Ophthalmology

## 2021-10-10 DIAGNOSIS — E113393 Type 2 diabetes mellitus with moderate nonproliferative diabetic retinopathy without macular edema, bilateral: Secondary | ICD-10-CM

## 2021-10-10 DIAGNOSIS — I10 Essential (primary) hypertension: Secondary | ICD-10-CM | POA: Diagnosis not present

## 2021-10-10 DIAGNOSIS — H35033 Hypertensive retinopathy, bilateral: Secondary | ICD-10-CM | POA: Diagnosis not present

## 2021-10-10 DIAGNOSIS — Z961 Presence of intraocular lens: Secondary | ICD-10-CM

## 2021-10-10 DIAGNOSIS — H21232 Degeneration of iris (pigmentary), left eye: Secondary | ICD-10-CM

## 2021-10-10 NOTE — Telephone Encounter (Signed)
Returned the pt's call and advised her that we cannot get anything approved as of yet because she hasn't been seen in 3 years here in the office and that her colonoscopy hasn't been reported out to me yet. So the pt is going to seek advise from PCP until her visit her in the summer.

## 2021-10-10 NOTE — Telephone Encounter (Signed)
Phoned to the pharmacy and spoke with Kathlee Nations and advised her that pt has not had good results with any strength Linzess, so I will do a PA on the Trulance.

## 2021-10-11 ENCOUNTER — Encounter (INDEPENDENT_AMBULATORY_CARE_PROVIDER_SITE_OTHER): Payer: Self-pay | Admitting: Ophthalmology

## 2021-10-17 NOTE — Telephone Encounter (Signed)
Noted! Thank you

## 2021-10-17 NOTE — Telephone Encounter (Signed)
duplicate

## 2022-01-01 ENCOUNTER — Emergency Department (HOSPITAL_COMMUNITY)
Admission: EM | Admit: 2022-01-01 | Discharge: 2022-01-01 | Disposition: A | Discharge status: Home / Self Care | Payer: Medicare HMO | Attending: Emergency Medicine | Admitting: Emergency Medicine

## 2022-01-01 ENCOUNTER — Other Ambulatory Visit: Payer: Self-pay

## 2022-01-01 ENCOUNTER — Encounter (HOSPITAL_COMMUNITY): Payer: Self-pay | Admitting: Emergency Medicine

## 2022-01-01 DIAGNOSIS — Z794 Long term (current) use of insulin: Secondary | ICD-10-CM | POA: Insufficient documentation

## 2022-01-01 DIAGNOSIS — R1084 Generalized abdominal pain: Secondary | ICD-10-CM | POA: Diagnosis not present

## 2022-01-01 DIAGNOSIS — R110 Nausea: Secondary | ICD-10-CM | POA: Diagnosis not present

## 2022-01-01 DIAGNOSIS — R197 Diarrhea, unspecified: Secondary | ICD-10-CM | POA: Diagnosis present

## 2022-01-01 DIAGNOSIS — Z7982 Long term (current) use of aspirin: Secondary | ICD-10-CM | POA: Diagnosis not present

## 2022-01-01 DIAGNOSIS — E119 Type 2 diabetes mellitus without complications: Secondary | ICD-10-CM | POA: Diagnosis not present

## 2022-01-01 LAB — CBC WITH DIFFERENTIAL/PLATELET
Abs Immature Granulocytes: 0.02 10*3/uL (ref 0.00–0.07)
Basophils Absolute: 0.1 10*3/uL (ref 0.0–0.1)
Basophils Relative: 1 %
Eosinophils Absolute: 0.3 10*3/uL (ref 0.0–0.5)
Eosinophils Relative: 5 %
HCT: 39.8 % (ref 36.0–46.0)
Hemoglobin: 12.5 g/dL (ref 12.0–15.0)
Immature Granulocytes: 0 %
Lymphocytes Relative: 28 %
Lymphs Abs: 1.7 10*3/uL (ref 0.7–4.0)
MCH: 25.1 pg — ABNORMAL LOW (ref 26.0–34.0)
MCHC: 31.4 g/dL (ref 30.0–36.0)
MCV: 79.8 fL — ABNORMAL LOW (ref 80.0–100.0)
Monocytes Absolute: 0.5 10*3/uL (ref 0.1–1.0)
Monocytes Relative: 9 %
Neutro Abs: 3.4 10*3/uL (ref 1.7–7.7)
Neutrophils Relative %: 57 %
Platelets: 217 10*3/uL (ref 150–400)
RBC: 4.99 MIL/uL (ref 3.87–5.11)
RDW: 14.2 % (ref 11.5–15.5)
WBC: 6 10*3/uL (ref 4.0–10.5)
nRBC: 0 % (ref 0.0–0.2)

## 2022-01-01 LAB — BASIC METABOLIC PANEL
Anion gap: 8 (ref 5–15)
BUN: 32 mg/dL — ABNORMAL HIGH (ref 8–23)
CO2: 24 mmol/L (ref 22–32)
Calcium: 9.4 mg/dL (ref 8.9–10.3)
Chloride: 109 mmol/L (ref 98–111)
Creatinine, Ser: 1.58 mg/dL — ABNORMAL HIGH (ref 0.44–1.00)
GFR, Estimated: 37 mL/min — ABNORMAL LOW (ref >60)
Glucose, Bld: 213 mg/dL — ABNORMAL HIGH (ref 70–99)
Potassium: 4 mmol/L (ref 3.5–5.1)
Sodium: 141 mmol/L (ref 135–145)

## 2022-01-01 LAB — MAGNESIUM: Magnesium: 2.2 mg/dL (ref 1.7–2.4)

## 2022-01-01 LAB — LIPASE, BLOOD: Lipase: 26 U/L (ref 11–51)

## 2022-01-01 MED ORDER — LACTATED RINGERS IV BOLUS
1000.0000 mL | Freq: Once | INTRAVENOUS | Status: AC
Start: 2022-01-01 — End: 2022-01-01
  Administered 2022-01-01: 1000 mL via INTRAVENOUS

## 2022-01-01 NOTE — ED Triage Notes (Signed)
Pt to the ED with intermittent diarrhea since since Tuesday. ? ?Pt has tried imodium without relief. Pt endorses nausea and slight abdominal pain ?  ?

## 2022-01-02 NOTE — ED Provider Notes (Signed)
?[place] ?Provider Note ? ? ?CSN: [id number] ?Arrival date & time: 01/01/22  4782 ? ?  ? ?History ? ?Chief Complaint  ?Patient presents with  ? Abdominal Pain  ? ? ?Nicole Golden is a 61 y.o. female. ? ?HPI ? ?  ?61 year old female comes in with chief complaint of abdominal pain.  Patient decays that she has been having intermittent episodes of diarrhea since the last 4 to 5 days.  No recent antibiotics.  Diarrhea described as loose, watery with yellow stool.  No blood.  She denies any emesis with it.  Patient is having nausea, and abdominal discomfort that is diffuse.  No sick contacts.  No recent travel history. ? ?Home Medications ?Prior to Admission medications   ?Medication Sig Start Date End Date Taking? Authorizing Provider  ?amLODipine (NORVASC) 10 MG tablet Take 10 mg by mouth in the morning.   Yes [provider]  ?aspirin EC 81 MG tablet Take 81 mg by mouth daily. Swallow whole.   Yes [provider]  ?Cholecalciferol (VITAMIN D3) 50 MCG (2000 UT) TABS Take 2,000 Units by mouth in the morning.   Yes [provider]  ?Cyanocobalamin (VITAMIN B-12 PO) Take 1 tablet by mouth in the morning. Gummy   Yes [provider]  ?dorzolamide-timolol (COSOPT) 22.3-6.8 MG/ML ophthalmic solution Place 1 drop into both eyes in the morning. 08/06/20  Yes [provider]  ?LANTUS SOLOSTAR 100 UNIT/ML Solostar Pen Inject 40 Units into the skin 2 (two) times daily. ?Patient taking differently: Inject 60 Units into the skin 2 (two) times daily. 03/08/21  Yes Kathie Dike, MD  ?Loperamide HCl (IMODIUM A-D PO) Take by mouth.   Yes [provider]  ?losartan (COZAAR) 100 MG tablet Take 100 mg by mouth in the morning. 06/23/21  Yes [provider]  ?MOUNJARO 2.5 MG/0.5ML Pen Inject 2.5 mg into the skin every Sunday. 08/16/21  Yes [provider]  ?MOVANTIK 25 MG TABS tablet Take 25 mg by mouth every morning. 10/18/21  Yes [provider]  ?NOVOLOG FLEXPEN 100 UNIT/ML FlexPen Inject 15 Units into the skin 3 (three) times daily before meals. 03/04/20  Yes [provider]  ?rosuvastatin (CRESTOR) 40 MG tablet Take 40 mg by mouth in the morning.   Yes [provider]  ?Plecanatide (TRULANCE) 3 MG TABS Take 3 mg by mouth daily. ?Patient not taking: Reported on 01/01/2022 10/06/21 04/04/22  Nicole Harman, DO  ?   ? ?Allergies    ?No healthtouch food allergies   ? ?Review of Systems   ?Review of Systems  ?All other systems reviewed and are negative. ? ?Physical Exam ?Updated Vital Signs ?BP (!) 175/73   Pulse 80   Temp 98.4 ?F (36.9 ?C) (Oral)   Resp 16   Ht '5\' 8"'$  (1.727 m)   Wt 90.3 kg   SpO2 100%   BMI 30.26 kg/m?  ?Physical Exam ?Vitals and nursing note reviewed.  ?Constitutional:   ?   Appearance: She is well-developed.  ?HENT:  ?   Head: Atraumatic.  ?Cardiovascular:  ?   Rate and Rhythm: Normal rate.  ?Pulmonary:  ?   Effort: Pulmonary effort is normal.  ?Abdominal:  ?   Tenderness: There is abdominal tenderness. There is no guarding or rebound.  ?Musculoskeletal:  ?   Cervical back: Normal range of motion and neck supple.  ?Skin: ?   General: Skin is warm and dry.  ?Neurological:  ?   Mental  Status: She is alert and oriented to person, place, and time.  ? ? ?ED Results / Procedures / Treatments   ?Labs ?(all labs ordered are listed, but only abnormal results are displayed) ?Labs Reviewed  ?CBC WITH DIFFERENTIAL/PLATELET - Abnormal; Notable for the following components:  ?    Result Value  ? MCV 79.8 (*)   ? MCH 25.1 (*)   ? All other components within normal limits  ?BASIC METABOLIC PANEL - Abnormal; Notable for the following components:  ? Glucose, Bld 213 (*)   ? BUN 32 (*)   ? Creatinine, Ser 1.58 (*)   ? GFR, Estimated 37 (*)   ? All other components within normal limits  ?MAGNESIUM  ?LIPASE, BLOOD  ? ? ?EKG ?None ? ?Radiology ?No results found. ? ?Procedures ?Procedures  ? ? ?Medications Ordered in  ED ?Medications  ?lactated ringers bolus 1,000 mL (0 mLs Intravenous Stopped 01/01/22 1217)  ? ? ?ED Course/ Medical Decision Making/ A&P ?Clinical Course as of 01/02/22 1050  ?Mon Jan 02, 2022  ?1050 Lab results reviewed independently.  Patient has slight elevated glucose, no DKA.  She has slightly elevated creatinine, but has passed oral challenge here.  Patient received IV fluids.  Results discussed with her.  Discussed with her the importance of good hydration and return to the ER if her symptoms are getting worse.  PCP follow-up requested.  Also advised her to discuss with her PCP if the medications is the cause for her symptoms if they persist. [AN]  ?  ?Clinical Course User Index ?[AN] Varney Biles, MD  ? ?                        ?Medical Decision Making ?Amount and/or Complexity of Data Reviewed ?Labs: ordered. ? ? ?This patient presents to the ED with chief complaint(s) of diarrhea with pertinent past medical history of diabetes and DKA, sepsis due to cellulitis which  ?further complicates the presenting complaint.  ? ?Currently, the symptoms are not limited to GI system only.  There is mild abdominal cramp, nausea but no other systemic symptoms including no fevers ? ?The differential diagnosis includes : Gastroenteritis, toxic colitis, viral colitis, medication side effects as patient is on meds that were started about a month ago which has known GI side effects ? ?The initial plan is to order basic labs only.  Patient has no concerning travel history, no fevers, no bloody stools which is all reassuring.  Stool cultures are not indicated at this time.  No significant risk factors for ? ? ? ?Additional history obtained: ?Records reviewed Primary Care Documents -to go over her diabetes medications ? ? ? ? ?Final Clinical Impression(s) / ED Diagnoses ?Final diagnoses:  ?Diarrhea, unspecified type  ? ? ?Rx / DC Orders ?ED Discharge Orders   ? ? None  ? ?  ? ? ?  ?Varney Biles, MD ?01/02/22 1050 ? ?

## 2022-02-15 ENCOUNTER — Encounter: Payer: Self-pay | Admitting: Gastroenterology

## 2022-02-15 ENCOUNTER — Ambulatory Visit (INDEPENDENT_AMBULATORY_CARE_PROVIDER_SITE_OTHER): Payer: Medicare HMO | Admitting: Gastroenterology

## 2022-02-15 ENCOUNTER — Telehealth: Payer: Self-pay | Admitting: *Deleted

## 2022-02-15 VITALS — BP 157/68 | HR 75 | Temp 98.1°F | Ht 68.0 in | Wt 183.6 lb

## 2022-02-15 DIAGNOSIS — Z1211 Encounter for screening for malignant neoplasm of colon: Secondary | ICD-10-CM

## 2022-02-15 DIAGNOSIS — K5900 Constipation, unspecified: Secondary | ICD-10-CM

## 2022-02-15 NOTE — Progress Notes (Signed)
Gastroenterology Office Note     Primary Care Physician:  [name], DO  Primary Gastroenterologist: Dr. Abbey Chatters   Chief Complaint   Chief Complaint  Patient presents with   Repeat TCS    Patient here today for a consult as she states she had a TCS on 10/04/2021 and the Doctor was unable to finish as she says she was told she was constipated. Denies any current gi issues.     History of Present Illness   Nicole Golden is a 61 y.o. female presenting today in follow-up with a history of chronic constipation, recently attempting colonoscopy but inadequate prep.   Liquid prescription was prescribed by her PCP, but she can't remember the name. States she takes it twice a week for constipation. Doesn't take every day as works at [employer] and does not want to take while she is working. Doesn't cause diarrhea. Likes veggies.    Has tried miralax in the past. Linzess. No improvement. No overt GI bleeding. No abdominal pain.     Past Surgical History:  Procedure Laterality Date   AMPUTATION TOE Right 03/03/2021   Procedure: AMPUTATION TOE;  Surgeon: Aviva Signs, MD;  Location: [facility];  Service: General;  Laterality: Right;   CATARACT EXTRACTION Bilateral    COLONOSCOPY WITH PROPOFOL N/A 10/04/2021   Procedure: COLONOSCOPY WITH PROPOFOL;  Surgeon: [name], DO;  Location: [facility];  Service: Endoscopy;  Laterality: N/A;  11:00 / ASA 2   EYE SURGERY Bilateral    Cat Sx   FOOT SURGERY     right foot   TUBAL LIGATION      Current Outpatient Medications  Medication Sig Dispense Refill   amLODipine (NORVASC) 10 MG tablet Take 10 mg by mouth in the morning.     aspirin EC 81 MG tablet Take 81 mg by mouth daily. Swallow whole.     Cholecalciferol (VITAMIN D3) 50 MCG (2000 UT) TABS Take 2,000 Units by mouth in the morning.     Cyanocobalamin (VITAMIN B-12 PO) Take 1 tablet by mouth in the morning. Gummy     dorzolamide-timolol (COSOPT) 22.3-6.8 MG/ML  ophthalmic solution Place 1 drop into both eyes in the morning.     LANTUS SOLOSTAR 100 UNIT/ML Solostar Pen Inject 40 Units into the skin 2 (two) times daily. (Patient taking differently: Inject 60 Units into the skin 2 (two) times daily.) 15 mL 11   Loperamide HCl (IMODIUM A-D PO) Take by mouth as needed.     losartan (COZAAR) 100 MG tablet Take 100 mg by mouth in the morning.     MOUNJARO 2.5 MG/0.5ML Pen Inject 2.5 mg into the skin every Sunday.     MOVANTIK 25 MG TABS tablet Take 25 mg by mouth every morning.     NOVOLOG FLEXPEN 100 UNIT/ML FlexPen Inject 15 Units into the skin 3 (three) times daily before meals.     rosuvastatin (CRESTOR) 40 MG tablet Take 40 mg by mouth in the morning.     No current facility-administered medications for this visit.    Allergies as of 02/15/2022 - Review Complete 02/15/2022  Allergen Reaction Noted   No healthtouch food allergies Itching and Rash 10/03/2021    Family History  Problem Relation Age of Onset   Breast cancer Paternal Aunt    Diabetes Father    Colon cancer Neg Hx    Colon polyps Neg Hx     Social History   Socioeconomic History  Marital status: Single    Spouse name: Not on file   Number of children: Not on file   Years of education: Not on file   Highest education level: Not on file  Occupational History   Occupation: [employer]    Comment: in [place], first shift   Tobacco Use   Smoking status: Former   Smokeless tobacco: Never  Scientific laboratory technician Use: Never used  Substance and Sexual Activity   Alcohol use: Yes    Comment: occas   Drug use: Never   Sexual activity: Not on file  Other Topics Concern   Not on file  Social History Narrative   Not on file   Social Determinants of Health   Financial Resource Strain: Not on file  Food Insecurity: Not on file  Transportation Needs: Not on file  Physical Activity: Not on file  Stress: Not on file  Social Connections: Not on file  Intimate Partner Violence: Not  on file     Review of Systems   Gen: Denies any fever, chills, fatigue, weight loss, lack of appetite.  CV: Denies chest pain, heart palpitations, peripheral edema, syncope.  Resp: Denies shortness of breath at rest or with exertion. Denies wheezing or cough.  GI: see HPI GU : Denies urinary burning, urinary frequency, urinary hesitancy MS: Denies joint pain, muscle weakness, cramps, or limitation of movement.  Derm: Denies rash, itching, dry skin Psych: Denies depression, anxiety, memory loss, and confusion Heme: Denies bruising, bleeding, and enlarged lymph nodes.   Physical Exam   BP (!) 157/68 (BP Location: Right Arm, Patient Position: Sitting, Cuff Size: Large)   Pulse 75   Temp 98.1 F (36.7 C) (Oral)   Ht '5\' 8"'$  (1.727 m)   Wt 183 lb 9.6 oz (83.3 kg)   BMI 27.92 kg/m  General:   Alert and oriented. Pleasant and cooperative. Well-nourished and well-developed.  Head:  Normocephalic and atraumatic. Eyes:  Without icterus Abdomen:  +BS, soft, non-tender and non-distended. No HSM noted. No guarding or rebound. No masses appreciated.  Rectal:  Deferred  Msk:  Symmetrical without gross deformities. Normal posture. Extremities:  Without edema. Neurologic:  Alert and  oriented x4;  grossly normal neurologically. Skin:  Intact without significant lesions or rashes. Psych:  Alert and cooperative. Normal mood and affect.   Assessment   Nicole Golden is a 61 y.o. female presenting today in follow-up with a history of chronic constipation and recently inadequate prep at time of screening colonoscopy Feb 2023.   She is taking a liquid medication as needed for constipation, which she states works well. I have asked her to call with this update. Add Benefiber daily. Recommend taking the prescription prescribed by PCP daily if possible.  Will need extra day of clear liquids and extra 1/2 of prep in addition to standard prep.    PLAN    Proceed with colonoscopy by Dr. Abbey Chatters   in near future: the risks, benefits, and alternatives have been discussed with the patient in detail. The patient states understanding and desires to proceed.  Extra day of clears Extra 1/2 day of prep Call with update regarding medication prescribed by PCP   Annitta Needs, PhD, ANP-BC [facility]

## 2022-02-15 NOTE — Telephone Encounter (Signed)
LMOVM to call back to [place] TCS with Dr. Abbey Chatters, ASA 2

## 2022-02-15 NOTE — Patient Instructions (Addendum)
I recommend starting Benefiber 2 teaspoons daily each morning.   Call and let us know prescription information for constipation. It will be helpful if you can take this daily or every other day.   For the colonoscopy: we are giving an extra day of clear liquids and an extra prep. No insulin the morning of the procedure, and no insulin the day of the procedure.   Further recommendations to follow!  I enjoyed seeing you again today! As you know, I value our relationship and want to provide genuine, compassionate, and quality care. I welcome your feedback. If you receive a survey regarding your visit,  I greatly appreciate you taking time to fill this out. See you next time!  Annitta Needs, PhD, ANP-BC [facility]astroenter[facility]

## 2022-02-22 NOTE — Telephone Encounter (Signed)
Called pt, went straight to VM. VM full. Letter mailed to call

## 2022-04-10 ENCOUNTER — Encounter (INDEPENDENT_AMBULATORY_CARE_PROVIDER_SITE_OTHER): Payer: Medicare HMO | Admitting: Ophthalmology

## 2022-04-10 DIAGNOSIS — Z961 Presence of intraocular lens: Secondary | ICD-10-CM

## 2022-04-10 DIAGNOSIS — E113393 Type 2 diabetes mellitus with moderate nonproliferative diabetic retinopathy without macular edema, bilateral: Secondary | ICD-10-CM

## 2022-04-10 DIAGNOSIS — H21232 Degeneration of iris (pigmentary), left eye: Secondary | ICD-10-CM

## 2022-04-10 DIAGNOSIS — H35033 Hypertensive retinopathy, bilateral: Secondary | ICD-10-CM

## 2022-04-10 DIAGNOSIS — I10 Essential (primary) hypertension: Secondary | ICD-10-CM

## 2022-04-14 ENCOUNTER — Encounter (INDEPENDENT_AMBULATORY_CARE_PROVIDER_SITE_OTHER): Payer: Medicare HMO | Admitting: Ophthalmology

## 2022-04-14 DIAGNOSIS — I10 Essential (primary) hypertension: Secondary | ICD-10-CM

## 2022-04-14 DIAGNOSIS — H21232 Degeneration of iris (pigmentary), left eye: Secondary | ICD-10-CM

## 2022-04-14 DIAGNOSIS — H35033 Hypertensive retinopathy, bilateral: Secondary | ICD-10-CM

## 2022-04-14 DIAGNOSIS — Z961 Presence of intraocular lens: Secondary | ICD-10-CM

## 2022-04-14 DIAGNOSIS — E113393 Type 2 diabetes mellitus with moderate nonproliferative diabetic retinopathy without macular edema, bilateral: Secondary | ICD-10-CM

## 2022-04-20 NOTE — Progress Notes (Signed)
[facility] Note  04/21/2022     CHIEF COMPLAINT Patient presents for Retina Follow Up   HISTORY OF PRESENT ILLNESS: Nicole Golden is a 61 y.o. female who presents to the clinic today for:   HPI     Retina Follow Up   Patient presents with  Other.  In left eye.  This started 6 months ago.  I, the attending physician,  performed the HPI with the patient and updated documentation appropriately.        Comments   Patient here for 6 months retina follow up for pigment dispersion syndrome OS. Patient states vision is the same. No eye pain.       Last edited by Bernarda Caffey, MD on 04/22/2022 12:51 AM.    Pt states no change in vision  Referring physician: [name], DO [place],  [place] [id number]  HISTORICAL INFORMATION:   Selected notes from the MEDICAL RECORD NUMBER Referred by [facility] for DM eye exam   CURRENT MEDICATIONS: Current Outpatient Medications (Ophthalmic Drugs)  Medication Sig   dorzolamide-timolol (COSOPT) 22.3-6.8 MG/ML ophthalmic solution Place 1 drop into both eyes in the morning.   No current facility-administered medications for this visit. (Ophthalmic Drugs)   Current Outpatient Medications (Other)  Medication Sig   amLODipine (NORVASC) 10 MG tablet Take 10 mg by mouth in the morning.   aspirin EC 81 MG tablet Take 81 mg by mouth daily. Swallow whole.   Cholecalciferol (VITAMIN D3) 50 MCG (2000 UT) TABS Take 2,000 Units by mouth in the morning.   Cyanocobalamin (VITAMIN B-12 PO) Take 1 tablet by mouth in the morning. Gummy   LANTUS SOLOSTAR 100 UNIT/ML Solostar Pen Inject 40 Units into the skin 2 (two) times daily. (Patient taking differently: Inject 60 Units into the skin 2 (two) times daily.)   losartan (COZAAR) 100 MG tablet Take 100 mg by mouth in the morning.   MOUNJARO 2.5 MG/0.5ML Pen Inject 2.5 mg into the skin every Sunday.   NOVOLOG FLEXPEN 100 UNIT/ML FlexPen Inject 15 Units into the skin 3  (three) times daily before meals.   rosuvastatin (CRESTOR) 40 MG tablet Take 40 mg by mouth in the morning.   No current facility-administered medications for this visit. (Other)   REVIEW OF SYSTEMS: ROS   Positive for: Musculoskeletal, Endocrine, Eyes Negative for: Constitutional, Gastrointestinal, Neurological, Skin, Genitourinary, HENT, Cardiovascular, Respiratory, Psychiatric, Allergic/Imm, Heme/Lymph Last edited by Theodore Demark, COA on 04/21/2022  1:59 PM.      ALLERGIES Allergies  Allergen Reactions   No Healthtouch Food Allergies Itching and Rash    All fruits including tomatoes   PAST MEDICAL HISTORY Past Medical History:  Diagnosis Date   Diabetes mellitus without complication ([place])    Diabetic neuropathy ([place])    Diabetic neuropathy ([place])    Hypertension    Hypertensive retinopathy    OU   Past Surgical History:  Procedure Laterality Date   AMPUTATION TOE Right 03/03/2021   Procedure: AMPUTATION TOE;  Surgeon: Aviva Signs, MD;  Location: [facility];  Service: General;  Laterality: Right;   CATARACT EXTRACTION Bilateral    COLONOSCOPY WITH PROPOFOL N/A 10/04/2021   Procedure: COLONOSCOPY WITH PROPOFOL;  Surgeon: [name], DO;  Location[facility];  Service: Endoscopy;  Laterality: N/A;  11:00 / ASA 2   EYE SURGERY Bilateral    Cat Sx   FOOT SURGERY     right foot   TUBAL LIGATION  FAMILY HISTORY Family History  Problem Relation Age of Onset   Breast cancer Paternal Aunt    Diabetes Father    Colon cancer Neg Hx    Colon polyps Neg Hx    SOCIAL HISTORY Social History   Tobacco Use   Smoking status: Former   Smokeless tobacco: Never  Scientific laboratory technician Use: Never used  Substance Use Topics   Alcohol use: Yes    Comment: occas   Drug use: Never       OPHTHALMIC EXAM:  Base Eye Exam     Visual Acuity (Snellen - Linear)       Right Left   Dist [place] 20/20 20/40 +2   Dist ph [place]  20/30 +1         Tonometry (Tonopen, 1:57 PM)        Right Left   Pressure 15 15         Pupils       Dark Light Shape React APD   Right 3 2 Round Minimal None   Left 3 2 Round Minimal None         Visual Fields (Counting fingers)       Left Right    Full Full         Extraocular Movement       Right Left    Full, Ortho Full, Ortho         Neuro/Psych     Oriented x3: Yes   Mood/Affect: Normal         Dilation     Both eyes: 1.0% Mydriacyl, 2.5% Phenylephrine @ 1:57 PM           Slit Lamp and Fundus Exam     Slit Lamp Exam       Right Left   Lids/Lashes Dermatochalasis - upper lid Dermatochalasis - upper lid   Conjunctiva/Sclera Mild Melanosis Mild Melanosis   Cornea Arcus, well healed temporal cataract wounds, trace Punctate epithelial erosions Mild Arcus, well healed temporal cataract wounds, trace Punctate epithelial erosions, +endopigment, Krukenberg spindle -- very mild   Anterior Chamber Deep and quiet Deep and clear; no cell or pigment   Iris Round and dilated, No NVI Round and dilated, No NVI.  No TIDs.   Lens PC IOL in good position PC IOL in good position, ?tilt, mild pigment deposition   Anterior Vitreous Mild Vitreous syneresis Mild Vitreous syneresis         Fundus Exam       Right Left   Disc Pallor, Sharp rim, +PPA/PPP, +SVP Mild pallor, Sharp rim, mild PPA, focal fibrosis @ 0600.   C/D Ratio 0.2 0.2   Macula Flat, good foveal reflex, Retinal pigment epithelial mottling and clumping, scattered MA/IRH temporal macula, no edema Flat, good foveal reflex, Retinal pigment epithelial mottling, scattered focal DBH temporal macula.  No edema.   Vessels attenuated, Tortuous attenuated, Tortuous   Periphery Attached, mild reticular degeneration, Scattered MA/IRH, peripheral drusen Attached, scattered MA/DBH greatest temporal periphery, peripheral drusen           IMAGING AND PROCEDURES  Imaging and Procedures for _0 @  OCT, Retina - OU - Both Eyes       Right  Eye Quality was good. Central Foveal Thickness: 259. Progression has been stable. Findings include normal foveal contour, no SRF, intraretinal fluid, vitreomacular adhesion (Interval improvement in IRF IT mac).   Left Eye Quality was good. Central Foveal Thickness: 269. Progression has been stable. Findings  include normal foveal contour, no IRF, no SRF, vitreomacular adhesion (Partial PVD).   Notes *Images captured and stored on drive  Diagnosis / Impression:  No frank DME OU OD: Interval improvement in IRF IT mac  Clinical management:  See below  Abbreviations: NFP - Normal foveal profile. CME - cystoid macular edema. PED - pigment epithelial detachment. IRF - intraretinal fluid. SRF - subretinal fluid. EZ - ellipsoid zone. ERM - epiretinal membrane. ORA - outer retinal atrophy. ORT - outer retinal tubulation. SRHM - subretinal hyper-reflective material            ASSESSMENT/PLAN:    ICD-10-CM   1. Pigment dispersion syndrome of left eye  H21.232     2. Moderate nonproliferative diabetic retinopathy of both eyes without macular edema associated with type 2 diabetes mellitus ([place])  Q25.9563 OCT, Retina - OU - Both Eyes    3. Essential hypertension  I10     4. Hypertensive retinopathy of both eyes  H35.033     5. Pseudophakia of both eyes  Z96.1       1. Pigment dispersion syndrome OS  - BCVA 20/30 OS today  - exam shows scattered fine pigment in [facility] and ant vit + Krukenberg spindle -- stable from prior  - IOL OS with mild tilt -- ?etiology of PDS  - IOP okay at 15  - continue drops as directed by Dr. Katy Fitch    Prednisolone qd OS, Latanoprost qhs OS, Cosopt qd OS  - discussed findings, prognosis             - monitor  2. Moderate nonproliferative diabetic retinopathy w/o DME OU -- stable - The incidence, risk factors for progression, natural history and treatment options for diabetic retinopathy were discussed with patient.   - The need for close monitoring of blood  glucose, blood pressure, and serum lipids, avoiding cigarette or any type of tobacco, and the need for long term follow up was also discussed with patient. - A1C decreased to 10.4 per pt report - FA 02.13.23- late leaking MA OU, no NV OU - exam shows scattered MA/IRH OU - OCT without diabetic macular edema OU - f/u in 6 months DFE, OCT  3,4. Hypertensive retinopathy OU  - discussed importance of tight BP control  - monitor  5. Pseudophakia OU  - s/p CE/IOL both eyes [facility])  - doing well  - ? Mild tilt of PCIOL OS  - monitor  Ophthalmic Meds Ordered this visit:  No orders of the defined types were placed in this encounter.      Return in about 6 months (around 10/22/2022) for f/u pigment dispersion syndrome OS, DFE, OCT.  There are no Patient Instructions on file for this visit.  This document serves as a record of services personally performed by Gardiner Sleeper, MD, PhD. It was created on their behalf by Renaldo Reel, [place] an ophthalmic technician. The creation of this record is the provider's dictation and/or activities during the visit.    Electronically signed by:  Renaldo Reel, COT  04/20/22 12:52 AM   This document serves as a record of services personally performed by Gardiner Sleeper, MD, PhD. It was created on their behalf by San Jetty. Owens Shark, OA an ophthalmic technician. The creation of this record is the provider's dictation and/or activities during the visit.    Electronically signed by: San Jetty. Owens Shark, [place] 08.25.2023 12:52 AM  Gardiner Sleeper, M.D., Ph.D. Diseases & Surgery of the Retina and  Vitreous [facility]  I have reviewed the above documentation for accuracy and completeness, and I agree with the above. Gardiner Sleeper, M.D., Ph.D. 04/22/22 12:53 AM  Abbreviations: M myopia (nearsighted); A astigmatism; H hyperopia (farsighted); P presbyopia; Mrx spectacle prescription;  CTL contact lenses; OD right eye; OS left eye;  OU both eyes  XT exotropia; ET esotropia; PEK punctate epithelial keratitis; PEE punctate epithelial erosions; DES dry eye syndrome; MGD meibomian gland dysfunction; ATs artificial tears; PFAT's preservative free artificial tears; [place] nuclear sclerotic cataract; PSC posterior subcapsular cataract; ERM epi-retinal membrane; PVD posterior vitreous detachment; RD retinal detachment; DM diabetes mellitus; DR diabetic retinopathy; NPDR non-proliferative diabetic retinopathy; PDR proliferative diabetic retinopathy; CSME clinically significant macular edema; DME diabetic macular edema; dbh dot blot hemorrhages; CWS cotton wool spot; POAG primary open angle glaucoma; C/D cup-to-disc ratio; HVF humphrey visual field; GVF goldmann visual field; OCT optical coherence tomography; IOP intraocular pressure; BRVO Branch retinal vein occlusion; CRVO central retinal vein occlusion; CRAO central retinal artery occlusion; BRAO branch retinal artery occlusion; RT retinal tear; SB scleral buckle; PPV pars plana vitrectomy; VH Vitreous hemorrhage; PRP panretinal laser photocoagulation; IVK intravitreal kenalog; VMT vitreomacular traction; MH Macular hole;  NVD neovascularization of the disc; NVE neovascularization elsewhere; AREDS age related eye disease study; ARMD age related macular degeneration; POAG primary open angle glaucoma; EBMD epithelial/anterior basement membrane dystrophy; ACIOL anterior chamber intraocular lens; IOL intraocular lens; PCIOL posterior chamber intraocular lens; Phaco/IOL phacoemulsification with intraocular lens placement; [place] photorefractive keratectomy; LASIK laser assisted in situ keratomileusis; HTN hypertension; DM diabetes mellitus; COPD chronic obstructive pulmonary disease

## 2022-04-21 ENCOUNTER — Ambulatory Visit (INDEPENDENT_AMBULATORY_CARE_PROVIDER_SITE_OTHER): Payer: Medicare HMO | Admitting: Ophthalmology

## 2022-04-21 ENCOUNTER — Encounter (INDEPENDENT_AMBULATORY_CARE_PROVIDER_SITE_OTHER): Payer: Self-pay | Admitting: Ophthalmology

## 2022-04-21 DIAGNOSIS — I10 Essential (primary) hypertension: Secondary | ICD-10-CM

## 2022-04-21 DIAGNOSIS — H35033 Hypertensive retinopathy, bilateral: Secondary | ICD-10-CM

## 2022-04-21 DIAGNOSIS — H21232 Degeneration of iris (pigmentary), left eye: Secondary | ICD-10-CM

## 2022-04-21 DIAGNOSIS — Z961 Presence of intraocular lens: Secondary | ICD-10-CM

## 2022-04-21 DIAGNOSIS — E113393 Type 2 diabetes mellitus with moderate nonproliferative diabetic retinopathy without macular edema, bilateral: Secondary | ICD-10-CM | POA: Diagnosis not present

## 2022-04-22 ENCOUNTER — Encounter (INDEPENDENT_AMBULATORY_CARE_PROVIDER_SITE_OTHER): Payer: Self-pay | Admitting: Ophthalmology

## 2022-09-27 ENCOUNTER — Other Ambulatory Visit: Payer: Self-pay

## 2022-09-27 ENCOUNTER — Emergency Department (HOSPITAL_COMMUNITY)
Admission: EM | Admit: 2022-09-27 | Discharge: 2022-09-27 | Disposition: A | Discharge status: Home / Self Care | Payer: Medicare HMO | Attending: Emergency Medicine | Admitting: Emergency Medicine

## 2022-09-27 ENCOUNTER — Encounter (HOSPITAL_COMMUNITY): Payer: Self-pay | Admitting: Emergency Medicine

## 2022-09-27 DIAGNOSIS — L089 Local infection of the skin and subcutaneous tissue, unspecified: Secondary | ICD-10-CM | POA: Diagnosis not present

## 2022-09-27 DIAGNOSIS — E119 Type 2 diabetes mellitus without complications: Secondary | ICD-10-CM | POA: Insufficient documentation

## 2022-09-27 DIAGNOSIS — Z794 Long term (current) use of insulin: Secondary | ICD-10-CM | POA: Insufficient documentation

## 2022-09-27 DIAGNOSIS — L723 Sebaceous cyst: Secondary | ICD-10-CM | POA: Insufficient documentation

## 2022-09-27 DIAGNOSIS — L02213 Cutaneous abscess of chest wall: Secondary | ICD-10-CM | POA: Insufficient documentation

## 2022-09-27 DIAGNOSIS — Z7982 Long term (current) use of aspirin: Secondary | ICD-10-CM | POA: Insufficient documentation

## 2022-09-27 LAB — CBG MONITORING, ED
Glucose-Capillary: 79 mg/dL (ref 70–99)
Glucose-Capillary: 70 mg/dL (ref 70–99)

## 2022-09-27 MED ORDER — DOXYCYCLINE HYCLATE 100 MG PO CAPS: 100.0000 mg | ORAL_CAPSULE | Freq: Two times a day (BID) | ORAL | 0 refills | Status: DC

## 2022-09-27 MED ORDER — POVIDONE-IODINE 10 % EX SOLN
CUTANEOUS | Status: DC | PRN
  Filled 2022-09-27 (×2): qty 14.8

## 2022-09-27 MED ORDER — LIDOCAINE HCL (PF) 2 % IJ SOLN
10.0000 mL | Freq: Once | INTRAMUSCULAR | Status: AC
  Administered 2022-09-27: 10 mL

## 2022-09-27 NOTE — ED Provider Notes (Signed)
[facility] Provider Note   CSN: [id number] Arrival date & time: 09/27/22  1011     History  Chief Complaint  Patient presents with   Abscess    Nicole Golden is a 62 y.o. female.  The history is provided by the patient.  Abscess Location:  Torso Torso abscess location: midline chest.  reports had a black head at this site for months which has become enlarged, tender and drained a small amount of pus. Size:  2 cm Abscess quality: itching, painful and redness   Red streaking: no   Duration:  3 days Progression:  Worsening Pain details:    Quality:  Aching   Severity:  Mild   Duration:  3 days   Timing:  Constant   Progression:  Worsening Chronicity:  New Context: diabetes   Relieved by:  Nothing Worsened by:  Nothing Ineffective treatments:  Warm compresses Associated symptoms: no fever, no nausea and no vomiting   Risk factors: no prior abscess        Home Medications Prior to Admission medications   Medication Sig Start Date End Date Taking? Authorizing Provider  doxycycline (VIBRAMYCIN) 100 MG capsule Take 1 capsule (100 mg total) by mouth 2 (two) times daily. 09/27/22  Yes Zayne Marovich, Almyra Free, PA-C  amLODipine (NORVASC) 10 MG tablet Take 10 mg by mouth in the morning.    [provider]  aspirin EC 81 MG tablet Take 81 mg by mouth daily. Swallow whole.    [provider]  Cholecalciferol (VITAMIN D3) 50 MCG (2000 UT) TABS Take 2,000 Units by mouth in the morning.    [provider]  Cyanocobalamin (VITAMIN B-12 PO) Take 1 tablet by mouth in the morning. Gummy    [provider]  dorzolamide-timolol (COSOPT) 22.3-6.8 MG/ML ophthalmic solution Place 1 drop into both eyes in the morning. 08/06/20   [provider]  LANTUS SOLOSTAR 100 UNIT/ML Solostar Pen Inject 40 Units into the skin 2 (two) times daily. Patient taking differently: Inject 60 Units into the skin 2 (two) times daily.  03/08/21   Kathie Dike, MD  losartan (COZAAR) 100 MG tablet Take 100 mg by mouth in the morning. 06/23/21   [provider]  MOUNJARO 2.5 MG/0.5ML Pen Inject 2.5 mg into the skin every Sunday. 08/16/21   [provider]  NOVOLOG FLEXPEN 100 UNIT/ML FlexPen Inject 15 Units into the skin 3 (three) times daily before meals. 03/04/20   [provider]  rosuvastatin (CRESTOR) 40 MG tablet Take 40 mg by mouth in the morning.    [provider]      Allergies    No healthtouch food allergies    Review of Systems   Review of Systems  Constitutional:  Negative for chills and fever.  Respiratory:  Negative for shortness of breath and wheezing.   Gastrointestinal:  Negative for nausea and vomiting.  Skin:        Negative except as mentioned in HPI.    Neurological:  Negative for numbness.    Physical Exam Updated Vital Signs BP (!) 179/83 (BP Location: Right Arm)   Pulse 79   Temp 97.7 F (36.5 C) (Oral)   Resp 18   SpO2 100%  Physical Exam Constitutional:      Appearance: She is well-developed.  HENT:     Head: Normocephalic.  Cardiovascular:     Rate and Rhythm: Normal rate.  Pulmonary:     Effort: Pulmonary effort  is normal.  Skin:    Findings: Abscess present.     Comments: 2 cm raised indurated lesion upper mid chest with central enlarged dark pore, no spontaneous drainage, appearance of infected sebaceous cyst.  Neurological:     Mental Status: She is alert and oriented to person, place, and time.     Sensory: No sensory deficit.     ED Results / Procedures / Treatments    INCISION AND DRAINAGE Performed by: Evalee Jefferson Consent: Verbal consent obtained. Risks and benefits: risks, benefits and alternatives were discussed Type: abscess  Body area: chest wall  Anesthesia: local infiltration  Incision was made with a scalpel.  Local anesthetic: lidocaine 2% with epinephrine  Anesthetic total: 4 ml  Complexity: complex Blunt  dissection to break up loculations  Drainage: purulent  Drainage amount: moderate purulence. Several small pieces of sebum  Packing material: n/a  Patient tolerance: Patient tolerated the procedure well with no immediate complications.      Labs (all labs ordered are listed, but only abnormal results are displayed) Labs Reviewed  CBG MONITORING, ED  CBG MONITORING, ED    EKG None  Radiology No results found.  Procedures Procedures    Medications Ordered in ED Medications  povidone-iodine (BETADINE) 10 % external solution (has no administration in time range)  lidocaine HCl (PF) (XYLOCAINE) 2 % injection 10 mL (10 mLs Other Given 09/27/22 1337)    ED Course/ Medical Decision Making/ A&P                             Medical Decision Making Patient presenting with an infected sebaceous cyst mid chest.  She tolerated I&D well.  There was small pieces of sebum mixed with purulent drainage, less than what expected given the size of this indurated area.  The site was thoroughly explored, flushed and no further sebaceous material or sac was found.  It is possible the  indurated presentation is simply from inflammatory changes from this infection.  She is placed on doxycycline for 10 days.  We discussed that she should continue warm soaks twice daily and plan follow-up with her primary provider if symptoms are not significantly improved over the next week or for any worsening symptoms.  Risk OTC drugs. Prescription drug management.           Final Clinical Impression(s) / ED Diagnoses Final diagnoses:  Infected sebaceous cyst of skin    Rx / DC Orders ED Discharge Orders          Ordered    doxycycline (VIBRAMYCIN) 100 MG capsule  2 times daily        09/27/22 1340              Evalee Jefferson, PA-C 09/27/22 1840    Isla Pence, MD 09/28/22 [id number]

## 2022-09-27 NOTE — ED Notes (Signed)
Gave patient crackers and peanut butter with drink because she was beginning to feel bad. Sugar 70

## 2022-09-27 NOTE — Discharge Instructions (Addendum)
Continue using warm soaks twice daily as discussed.  Complete the entire course of the antibiotics.

## 2022-09-27 NOTE — ED Triage Notes (Signed)
Pt had black head on mid chest just right of sternum and now c/o hard "abscess" x 3 days. Area is slightly warm, raised. Slightly red and hard, tender to touch. Nad.

## 2022-10-19 ENCOUNTER — Encounter (INDEPENDENT_AMBULATORY_CARE_PROVIDER_SITE_OTHER): Payer: Medicare HMO | Admitting: Ophthalmology

## 2022-10-19 NOTE — Progress Notes (Shared)
[facility] Note  10/26/2022     CHIEF COMPLAINT Patient presents for No chief complaint on file.   HISTORY OF PRESENT ILLNESS: Nicole Golden is a 62 y.o. female who presents to the clinic today for:    Pt states no change in vision  Referring physician: [name], DO [place],  [place] [address]  HISTORICAL INFORMATION:   Selected notes from the [facility] Referred by [facility] for DM eye exam   CURRENT MEDICATIONS: Current Outpatient Medications (Ophthalmic Drugs)  Medication Sig   dorzolamide-timolol (COSOPT) 22.3-6.8 MG/ML ophthalmic solution Place 1 drop into both eyes in the morning.   No current facility-administered medications for this visit. (Ophthalmic Drugs)   Current Outpatient Medications (Other)  Medication Sig   amLODipine (NORVASC) 10 MG tablet Take 10 mg by mouth in the morning.   aspirin EC 81 MG tablet Take 81 mg by mouth daily. Swallow whole.   Cholecalciferol (VITAMIN D3) 50 MCG (2000 UT) TABS Take 2,000 Units by mouth in the morning.   Cyanocobalamin (VITAMIN B-12 PO) Take 1 tablet by mouth in the morning. Gummy   doxycycline (VIBRAMYCIN) 100 MG capsule Take 1 capsule (100 mg total) by mouth 2 (two) times daily.   LANTUS SOLOSTAR 100 UNIT/ML Solostar Pen Inject 40 Units into the skin 2 (two) times daily. (Patient taking differently: Inject 60 Units into the skin 2 (two) times daily.)   losartan (COZAAR) 100 MG tablet Take 100 mg by mouth in the morning.   MOUNJARO 2.5 MG/0.5ML Pen Inject 2.5 mg into the skin every Sunday.   NOVOLOG FLEXPEN 100 UNIT/ML FlexPen Inject 15 Units into the skin 3 (three) times daily before meals.   rosuvastatin (CRESTOR) 40 MG tablet Take 40 mg by mouth in the morning.   No current facility-administered medications for this visit. (Other)   REVIEW OF SYSTEMS:    ALLERGIES Allergies  Allergen Reactions   No Healthtouch Food Allergies Itching and Rash    All  fruits including tomatoes   PAST MEDICAL HISTORY Past Medical History:  Diagnosis Date   Diabetes mellitus without complication ([place])    Diabetic neuropathy ([place])    Diabetic neuropathy ([place])    Hypertension    Hypertensive retinopathy    OU   Past Surgical History:  Procedure Laterality Date   AMPUTATION TOE Right 03/03/2021   Procedure: AMPUTATION TOE;  Surgeon: Aviva Signs, MD;  Location: [facility];  Service: General;  Laterality: Right;   CATARACT EXTRACTION Bilateral    COLONOSCOPY WITH PROPOFOL N/A 10/04/2021   Procedure: COLONOSCOPY WITH PROPOFOL;  Surgeon: [name], DO;  Location: [facility];  Service: Endoscopy;  Laterality: N/A;  11:00 / ASA 2   EYE SURGERY Bilateral    Cat Sx   FOOT SURGERY     right foot   TUBAL LIGATION     FAMILY HISTORY Family History  Problem Relation Age of Onset   Breast cancer Paternal Aunt    Diabetes Father    Colon cancer Neg Hx    Colon polyps Neg Hx    SOCIAL HISTORY Social History   Tobacco Use   Smoking status: Former   Smokeless tobacco: Never  Scientific laboratory technician Use: Never used  Substance Use Topics   Alcohol use: Yes    Comment: occas   Drug use: Never       OPHTHALMIC EXAM:  Not recorded    IMAGING AND PROCEDURES  Imaging and Procedures for @TODAY$ @          ASSESSMENT/PLAN:    ICD-10-CM   1. Pigment dispersion syndrome of left eye  H21.232     2. Moderate nonproliferative diabetic retinopathy of both eyes without macular edema associated with type 2 diabetes mellitus ([place])  [id number]     3. Essential hypertension  I10     4. Hypertensive retinopathy of both eyes  H35.033     5. Pseudophakia of both eyes  Z96.1       1. Pigment dispersion syndrome OS  - BCVA 20/30 OS today  - exam shows scattered fine pigment in [facility] and ant vit + Krukenberg spindle -- stable from prior  - IOL OS with mild tilt -- ?etiology of PDS  - IOP okay at 15  - continue drops as directed by Dr. Katy Fitch     Prednisolone qd OS, Latanoprost qhs OS, Cosopt qd OS  - discussed findings, prognosis             - monitor  2. Moderate nonproliferative diabetic retinopathy w/o DME OU -- stable - The incidence, risk factors for progression, natural history and treatment options for diabetic retinopathy were discussed with patient.   - The need for close monitoring of blood glucose, blood pressure, and serum lipids, avoiding cigarette or any type of tobacco, and the need for long term follow up was also discussed with patient. - A1C decreased to 10.4 per pt report - FA 02.13.23- late leaking MA OU, no NV OU - exam shows scattered MA/IRH OU - OCT without diabetic macular edema OU - f/u in 6 months DFE, OCT  3,4. Hypertensive retinopathy OU  - discussed importance of tight BP control  - monitor  5. Pseudophakia OU  - s/p CE/IOL both eyes [facility])  - doing well  - ? Mild tilt of PCIOL OS  - monitor  Ophthalmic Meds Ordered this visit:  No orders of the defined types were placed in this encounter.      No follow-ups on file.  There are no Patient Instructions on file for this visit.  This document serves as a record of services personally performed by Gardiner Sleeper, MD, PhD. It was created on their behalf by San Jetty. Owens Shark, OA an ophthalmic technician. The creation of this record is the provider's dictation and/or activities during the visit.    Electronically signed by: San Jetty. Owens Shark, [place] 02.22.2024 11:35 AM   Gardiner Sleeper, M.D., Ph.D. Diseases & Surgery of the Retina and Vitreous [facility]: M myopia (nearsighted); A astigmatism; H hyperopia (farsighted); P presbyopia; Mrx spectacle prescription;  CTL contact lenses; OD right eye; OS left eye; OU both eyes  XT exotropia; ET esotropia; PEK punctate epithelial keratitis; PEE punctate epithelial erosions; DES dry eye syndrome; MGD meibomian gland dysfunction; ATs artificial tears; PFAT's  preservative free artificial tears; [place] nuclear sclerotic cataract; PSC posterior subcapsular cataract; ERM epi-retinal membrane; PVD posterior vitreous detachment; RD retinal detachment; DM diabetes mellitus; DR diabetic retinopathy; NPDR non-proliferative diabetic retinopathy; PDR proliferative diabetic retinopathy; CSME clinically significant macular edema; DME diabetic macular edema; dbh dot blot hemorrhages; CWS cotton wool spot; POAG primary open angle glaucoma; C/D cup-to-disc ratio; HVF humphrey visual field; GVF goldmann visual field; OCT optical coherence tomography; IOP intraocular pressure; BRVO Branch retinal vein occlusion; CRVO central retinal vein occlusion; CRAO central retinal artery occlusion; BRAO branch retinal artery occlusion; RT retinal tear; SB  scleral buckle; PPV pars plana vitrectomy; VH Vitreous hemorrhage; PRP panretinal laser photocoagulation; IVK intravitreal kenalog; VMT vitreomacular traction; MH Macular hole;  NVD neovascularization of the disc; NVE neovascularization elsewhere; AREDS age related eye disease study; ARMD age related macular degeneration; POAG primary open angle glaucoma; EBMD epithelial/anterior basement membrane dystrophy; ACIOL anterior chamber intraocular lens; IOL intraocular lens; PCIOL posterior chamber intraocular lens; Phaco/IOL phacoemulsification with intraocular lens placement; [place] photorefractive keratectomy; LASIK laser assisted in situ keratomileusis; HTN hypertension; DM diabetes mellitus; COPD chronic obstructive pulmonary disease

## 2022-10-20 ENCOUNTER — Encounter (INDEPENDENT_AMBULATORY_CARE_PROVIDER_SITE_OTHER): Payer: Medicare HMO | Admitting: Ophthalmology

## 2022-10-26 ENCOUNTER — Encounter (INDEPENDENT_AMBULATORY_CARE_PROVIDER_SITE_OTHER): Payer: Medicare HMO | Admitting: Ophthalmology

## 2022-10-26 DIAGNOSIS — E113393 Type 2 diabetes mellitus with moderate nonproliferative diabetic retinopathy without macular edema, bilateral: Secondary | ICD-10-CM

## 2022-10-26 DIAGNOSIS — H21232 Degeneration of iris (pigmentary), left eye: Secondary | ICD-10-CM

## 2022-10-26 DIAGNOSIS — Z961 Presence of intraocular lens: Secondary | ICD-10-CM

## 2022-10-26 DIAGNOSIS — H35033 Hypertensive retinopathy, bilateral: Secondary | ICD-10-CM

## 2022-10-26 DIAGNOSIS — I10 Essential (primary) hypertension: Secondary | ICD-10-CM

## 2022-11-01 NOTE — Progress Notes (Signed)
[facility] Note  11/13/2022     CHIEF COMPLAINT Patient presents for Retina Follow Up   HISTORY OF PRESENT ILLNESS: Nicole Golden is a 62 y.o. female who presents to the clinic today for:   HPI     Retina Follow Up   Patient presents with  Other.  In left eye.  Severity is moderate.  Duration of 6 months.  Since onset it is stable.  I, the attending physician,  performed the HPI with the patient and updated documentation appropriately.        Comments   Pt here for 6 mo ret f/u for pig dispersion syndrome OS. Pt states VA is about the same, no major changes but some blurriness OU at times. Most recent A1C was 8.       Last edited by Bernarda Caffey, MD on 11/15/2022 12:55 AM.    Pt states she has not seen Dr. Katy Fitch since she was here last, she is still using all her drops once a day, she states her blood sugar is well controlled  Referring physician: [name], DO [place],  [place] [address]  HISTORICAL INFORMATION:   Selected notes from the MEDICAL RECORD NUMBER Referred by [facility] for DM eye exam   CURRENT MEDICATIONS: Current Outpatient Medications (Ophthalmic Drugs)  Medication Sig   dorzolamide-timolol (COSOPT) 22.3-6.8 MG/ML ophthalmic solution Place 1 drop into both eyes in the morning.   No current facility-administered medications for this visit. (Ophthalmic Drugs)   Current Outpatient Medications (Other)  Medication Sig   amLODipine (NORVASC) 10 MG tablet Take 10 mg by mouth in the morning.   aspirin EC 81 MG tablet Take 81 mg by mouth daily. Swallow whole.   Cholecalciferol (VITAMIN D3) 50 MCG (2000 UT) TABS Take 2,000 Units by mouth in the morning.   Cyanocobalamin (VITAMIN B-12 PO) Take 1 tablet by mouth in the morning. Gummy   doxycycline (VIBRAMYCIN) 100 MG capsule Take 1 capsule (100 mg total) by mouth 2 (two) times daily.   LANTUS SOLOSTAR 100 UNIT/ML Solostar Pen Inject 40 Units into the skin 2 (two)  times daily. (Patient taking differently: Inject 60 Units into the skin 2 (two) times daily.)   losartan (COZAAR) 100 MG tablet Take 100 mg by mouth in the morning.   MOUNJARO 2.5 MG/0.5ML Pen Inject 2.5 mg into the skin every Sunday.   NOVOLOG FLEXPEN 100 UNIT/ML FlexPen Inject 15 Units into the skin 3 (three) times daily before meals.   rosuvastatin (CRESTOR) 40 MG tablet Take 40 mg by mouth in the morning.   No current facility-administered medications for this visit. (Other)   REVIEW OF SYSTEMS: ROS   Positive for: Musculoskeletal, Endocrine, Eyes Negative for: Constitutional, Gastrointestinal, Neurological, Skin, Genitourinary, HENT, Cardiovascular, Respiratory, Psychiatric, Allergic/Imm, Heme/Lymph Last edited by Kingsley Spittle, COT on 11/13/2022  8:35 AM.     ALLERGIES Allergies  Allergen Reactions   No Healthtouch Food Allergies Itching and Rash    All fruits including tomatoes   PAST MEDICAL HISTORY Past Medical History:  Diagnosis Date   Diabetes mellitus without complication ([place])    Diabetic neuropathy ([place])    Diabetic neuropathy ([place])    Hypertension    Hypertensive retinopathy    OU   Past Surgical History:  Procedure Laterality Date   AMPUTATION TOE Right 03/03/2021   Procedure: AMPUTATION TOE;  Surgeon: Aviva Signs, MD;  Location: [facility];  Service: General;  Laterality: Right;  CATARACT EXTRACTION Bilateral    COLONOSCOPY WITH PROPOFOL N/A 10/04/2021   Procedure: COLONOSCOPY WITH PROPOFOL;  Surgeon: [name], DO;  Location: [facility];  Service: Endoscopy;  Laterality: N/A;  11:00 / ASA 2   EYE SURGERY Bilateral    Cat Sx   FOOT SURGERY     right foot   TUBAL LIGATION     FAMILY HISTORY Family History  Problem Relation Age of Onset   Breast cancer Paternal Aunt    Diabetes Father    Colon cancer Neg Hx    Colon polyps Neg Hx    SOCIAL HISTORY Social History   Tobacco Use   Smoking status: Former   Smokeless tobacco: Never   Scientific laboratory technician Use: Never used  Substance Use Topics   Alcohol use: Yes    Comment: occas   Drug use: Never       OPHTHALMIC EXAM:  Base Eye Exam     Visual Acuity (Snellen - Linear)       Right Left   Dist [place] 20/20 -3 20/50 -2   Dist ph [place]  20/40         Tonometry (Tonopen, 8:42 AM)       Right Left   Pressure 16 14         Pupils       Pupils Dark Light Shape React APD   Right PERRL 3 2 Round Brisk None   Left PERRL 3 2 Round Brisk None         Visual Fields (Counting fingers)       Left Right    Full Full         Extraocular Movement       Right Left    Full, Ortho Full, Ortho         Neuro/Psych     Oriented x3: Yes   Mood/Affect: Normal         Dilation     Both eyes: 1.0% Mydriacyl, 2.5% Phenylephrine @ 8:42 AM           Slit Lamp and Fundus Exam     Slit Lamp Exam       Right Left   Lids/Lashes Dermatochalasis - upper lid Dermatochalasis - upper lid   Conjunctiva/Sclera Mild Melanosis Mild Melanosis   Cornea Arcus, well healed temporal cataract wounds, trace Punctate epithelial erosions Mild Arcus, well healed temporal cataract wounds, trace Punctate epithelial erosions, +endo pigment, Krukenberg spindle -- very mild   Anterior Chamber Deep and quiet Deep, 2+fine pigment   Iris Round and dilated, No NVI Round and moderately dilated, No NVI, no TIDs.   Lens PC IOL in good position PC IOL in good position, ?tilt, mild pigment deposition   Anterior Vitreous Mild Vitreous syneresis Mild Vitreous syneresis         Fundus Exam       Right Left   Disc Pallor, Sharp rim, +PPA/PPP, +SVP Mild pallor, Sharp rim, mild PPA, focal fibrosis @ 0600   C/D Ratio 0.2 0.2   Macula Flat, good foveal reflex, Retinal pigment epithelial mottling and clumping, scattered MA temporal macula, no edema Flat, good foveal reflex, Retinal pigment epithelial mottling, scattered focal DBH temporal macula -- improved, No edema   Vessels  attenuated, Tortuous attenuated, Tortuous   Periphery Attached, mild reticular degeneration, Scattered MA/IRH, peripheral drusen Attached, scattered MA/DBH greatest temporal periphery, peripheral drusen           Refraction  Manifest Refraction       Sphere Cylinder Axis Dist VA   Right       Left -2.75 +0.50 045 20/25           IMAGING AND PROCEDURES  Imaging and Procedures for @TODAY @  OCT, Retina - OU - Both Eyes       Right Eye Quality was good. Central Foveal Thickness: 255. Progression has been stable. Findings include normal foveal contour, no IRF, no SRF, vitreomacular adhesion (stable improvement in IRF IT mac).   Left Eye Quality was good. Central Foveal Thickness: 269. Progression has been stable. Findings include normal foveal contour, no IRF, no SRF, vitreomacular adhesion (Partial PVD).   Notes *Images captured and stored on drive  Diagnosis / Impression:  No frank DME OU OD: stable improvement in IRF IT mac  Clinical management:  See below  Abbreviations: NFP - Normal foveal profile. CME - cystoid macular edema. PED - pigment epithelial detachment. IRF - intraretinal fluid. SRF - subretinal fluid. EZ - ellipsoid zone. ERM - epiretinal membrane. ORA - outer retinal atrophy. ORT - outer retinal tubulation. SRHM - subretinal hyper-reflective material            ASSESSMENT/PLAN:    ICD-10-CM   1. Moderate nonproliferative diabetic retinopathy of both eyes without macular edema associated with type 2 diabetes mellitus (HCC)  B71.6967 OCT, Retina - OU - Both Eyes    2. Pigment dispersion syndrome of left eye  H21.232     3. Essential hypertension  I10     4. Hypertensive retinopathy of both eyes  H35.033     5. Pseudophakia of both eyes  Z96.1      1. Moderate nonproliferative diabetic retinopathy w/o DME OU -- stable - A1C 8 per pt report - FA 02.13.23- late leaking MA OU, no NV OU - exam shows scattered MA/IRH OU - OCT without  diabetic macular edema OU - f/u in 9 months DFE, OCT  2. Pigment dispersion syndrome OS  - BCVA 20/30 OS today  - exam shows scattered fine pigment in [facility] and ant vit + Krukenberg spindle -- stable from prior  - IOL OS with mild tilt -- ?etiology of PDS  - IOP okay at 14  - continue drops as directed by Dr. Katy Fitch    Prednisolone qd OS, Latanoprost qhs OS, Cosopt qd OS  - discussed findings, prognosis             - monitor  3,4. Hypertensive retinopathy OU  - discussed importance of tight BP control  - monitor  5. Pseudophakia OU  - s/p CE/IOL both eyes [facility])  - doing well  - ? Mild tilt of PCIOL OS  - monitor  Ophthalmic Meds Ordered this visit:  No orders of the defined types were placed in this encounter.    Return in about 9 months (around 08/15/2023) for Pigment dispersion OS, NPDR OU, DFE, OCT.  There are no Patient Instructions on file for this visit.  This document serves as a record of services personally performed by Gardiner Sleeper, MD, PhD. It was created on their behalf by Roselee Nova, COM[place]. The creation of this record is the provider's dictation and/or activities during the visit.  Electronically signed by: Roselee Nova, COMT 11/15/22 12:57 AM  This document serves as a record of services personally performed by Gardiner Sleeper, MD, PhD. It was created on their behalf by San Jetty. Owens Shark, OA an ophthalmic technician. The creation  of this record is the provider's dictation and/or activities during the visit.    Electronically signed by: San Jetty. Owens Shark, [place] 03.18.2024 12:57 AM   Gardiner Sleeper, M.D., Ph.D. Diseases & Surgery of the Retina and Vitreous [facility]  I have reviewed the above documentation for accuracy and completeness, and I agree with the above. Gardiner Sleeper, M.D., Ph.D. 11/15/22 1:01 AM   Abbreviations: M myopia (nearsighted); A astigmatism; H hyperopia (farsighted); P presbyopia; Mrx spectacle prescription;   CTL contact lenses; OD right eye; OS left eye; OU both eyes  XT exotropia; ET esotropia; PEK punctate epithelial keratitis; PEE punctate epithelial erosions; DES dry eye syndrome; MGD meibomian gland dysfunction; ATs artificial tears; PFAT's preservative free artificial tears; [place] nuclear sclerotic cataract; PSC posterior subcapsular cataract; ERM epi-retinal membrane; PVD posterior vitreous detachment; RD retinal detachment; DM diabetes mellitus; DR diabetic retinopathy; NPDR non-proliferative diabetic retinopathy; PDR proliferative diabetic retinopathy; CSME clinically significant macular edema; DME diabetic macular edema; dbh dot blot hemorrhages; CWS cotton wool spot; POAG primary open angle glaucoma; C/D cup-to-disc ratio; HVF humphrey visual field; GVF goldmann visual field; OCT optical coherence tomography; IOP intraocular pressure; BRVO Branch retinal vein occlusion; CRVO central retinal vein occlusion; CRAO central retinal artery occlusion; BRAO branch retinal artery occlusion; RT retinal tear; SB scleral buckle; PPV pars plana vitrectomy; VH Vitreous hemorrhage; PRP panretinal laser photocoagulation; IVK intravitreal kenalog; VMT vitreomacular traction; MH Macular hole;  NVD neovascularization of the disc; NVE neovascularization elsewhere; AREDS age related eye disease study; ARMD age related macular degeneration; POAG primary open angle glaucoma; EBMD epithelial/anterior basement membrane dystrophy; ACIOL anterior chamber intraocular lens; IOL intraocular lens; PCIOL posterior chamber intraocular lens; Phaco/IOL phacoemulsification with intraocular lens placement; [place] photorefractive keratectomy; LASIK laser assisted in situ keratomileusis; HTN hypertension; DM diabetes mellitus; COPD chronic obstructive pulmonary disease

## 2022-11-13 ENCOUNTER — Encounter (INDEPENDENT_AMBULATORY_CARE_PROVIDER_SITE_OTHER): Payer: Self-pay | Admitting: Ophthalmology

## 2022-11-13 ENCOUNTER — Ambulatory Visit (INDEPENDENT_AMBULATORY_CARE_PROVIDER_SITE_OTHER): Payer: Medicare HMO | Admitting: Ophthalmology

## 2022-11-13 DIAGNOSIS — I10 Essential (primary) hypertension: Secondary | ICD-10-CM | POA: Diagnosis not present

## 2022-11-13 DIAGNOSIS — Z961 Presence of intraocular lens: Secondary | ICD-10-CM

## 2022-11-13 DIAGNOSIS — E113393 Type 2 diabetes mellitus with moderate nonproliferative diabetic retinopathy without macular edema, bilateral: Secondary | ICD-10-CM

## 2022-11-13 DIAGNOSIS — H35033 Hypertensive retinopathy, bilateral: Secondary | ICD-10-CM

## 2022-11-13 DIAGNOSIS — H21232 Degeneration of iris (pigmentary), left eye: Secondary | ICD-10-CM

## 2022-11-15 ENCOUNTER — Encounter (INDEPENDENT_AMBULATORY_CARE_PROVIDER_SITE_OTHER): Payer: Self-pay | Admitting: Ophthalmology

## 2022-11-20 ENCOUNTER — Other Ambulatory Visit: Payer: Self-pay | Admitting: General Practice

## 2022-11-20 DIAGNOSIS — Z0000 Encounter for general adult medical examination without abnormal findings: Secondary | ICD-10-CM

## 2022-11-24 ENCOUNTER — Ambulatory Visit
Admission: RE | Admit: 2022-11-24 | Discharge: 2022-11-24 | Disposition: A | Discharge status: Home / Self Care | Payer: Medicare HMO | Source: Ambulatory Visit | Attending: General Practice | Admitting: General Practice

## 2022-11-24 DIAGNOSIS — Z0000 Encounter for general adult medical examination without abnormal findings: Secondary | ICD-10-CM

## 2022-12-27 ENCOUNTER — Emergency Department (HOSPITAL_COMMUNITY)
Admission: EM | Admit: 2022-12-27 | Discharge: 2022-12-27 | Disposition: A | Discharge status: Home / Self Care | Payer: Medicare HMO | Attending: Emergency Medicine | Admitting: Emergency Medicine

## 2022-12-27 ENCOUNTER — Encounter (HOSPITAL_COMMUNITY): Payer: Self-pay | Admitting: Emergency Medicine

## 2022-12-27 ENCOUNTER — Other Ambulatory Visit: Payer: Self-pay

## 2022-12-27 ENCOUNTER — Emergency Department (HOSPITAL_COMMUNITY): Payer: Medicare HMO

## 2022-12-27 DIAGNOSIS — I10 Essential (primary) hypertension: Secondary | ICD-10-CM | POA: Insufficient documentation

## 2022-12-27 DIAGNOSIS — L84 Corns and callosities: Secondary | ICD-10-CM | POA: Insufficient documentation

## 2022-12-27 DIAGNOSIS — E10621 Type 1 diabetes mellitus with foot ulcer: Secondary | ICD-10-CM

## 2022-12-27 DIAGNOSIS — E109 Type 1 diabetes mellitus without complications: Secondary | ICD-10-CM | POA: Insufficient documentation

## 2022-12-27 DIAGNOSIS — R2241 Localized swelling, mass and lump, right lower limb: Secondary | ICD-10-CM | POA: Insufficient documentation

## 2022-12-27 MED ORDER — AMOXICILLIN-POT CLAVULANATE 500-125 MG PO TABS: 1.0000 | ORAL_TABLET | Freq: Three times a day (TID) | ORAL | 0 refills | Status: DC

## 2022-12-27 MED ORDER — AMOXICILLIN-POT CLAVULANATE 875-125 MG PO TABS
1.0000 | ORAL_TABLET | Freq: Once | ORAL | Status: AC
  Administered 2022-12-27: 1 via ORAL
  Filled 2022-12-27: qty 1

## 2022-12-27 NOTE — ED Provider Notes (Signed)
[place] EMERGENCY DEPARTMENT AT [facility] Provider Note   CSN: [id number] Arrival date & time: 12/27/22  0017     History  Chief Complaint  Patient presents with   Foot Pain    Nicole Golden is a 62 y.o. female.  Patient is a 62 year old female with past medical history of diabetes, hypertension.  She presents today with complaints of redness and swelling to the right foot.  She reports a callus to the bottom of her foot that has been there for quite some time.  It now has a reddened area around it and patient is concerned about infection.  There has been no drainage.  She denies any fevers.  She does have limited feeling in her feet, but denies pain.  The history is provided by the patient.       Home Medications Prior to Admission medications   Medication Sig Start Date End Date Taking? Authorizing Provider  amLODipine (NORVASC) 10 MG tablet Take 10 mg by mouth in the morning.    [provider]  aspirin EC 81 MG tablet Take 81 mg by mouth daily. Swallow whole.    [provider]  Cholecalciferol (VITAMIN D3) 50 MCG (2000 UT) TABS Take 2,000 Units by mouth in the morning.    [provider]  Cyanocobalamin (VITAMIN B-12 PO) Take 1 tablet by mouth in the morning. Gummy    [provider]  dorzolamide-timolol (COSOPT) 22.3-6.8 MG/ML ophthalmic solution Place 1 drop into both eyes in the morning. 08/06/20   [provider]  doxycycline (VIBRAMYCIN) 100 MG capsule Take 1 capsule (100 mg total) by mouth 2 (two) times daily. 09/27/22   Burgess Amor, PA-C  LANTUS SOLOSTAR 100 UNIT/ML Solostar Pen Inject 40 Units into the skin 2 (two) times daily. Patient taking differently: Inject 60 Units into the skin 2 (two) times daily. 03/08/21   Erick Blinks, MD  losartan (COZAAR) 100 MG tablet Take 100 mg by mouth in the morning. 06/23/21   [provider]  MOUNJARO 2.5 MG/0.5ML Pen Inject 2.5 mg into the skin every Sunday.  08/16/21   [provider]  NOVOLOG FLEXPEN 100 UNIT/ML FlexPen Inject 15 Units into the skin 3 (three) times daily before meals. 03/04/20   [provider]  rosuvastatin (CRESTOR) 40 MG tablet Take 40 mg by mouth in the morning.    [provider]      Allergies    No healthtouch food allergies    Review of Systems   Review of Systems  All other systems reviewed and are negative.   Physical Exam Updated Vital Signs BP (!) 180/81   Pulse 86   Temp 98.1 F (36.7 C)   Resp 18   Ht 5\' 8"  (1.727 m)   Wt 76.7 kg   SpO2 100%   BMI 25.70 kg/m  Physical Exam Vitals and nursing note reviewed.  Constitutional:      Appearance: Normal appearance.  HENT:     Head: Normocephalic and atraumatic.  Pulmonary:     Effort: Pulmonary effort is normal.  Musculoskeletal:     Comments: There is a callus noted to the bottom of the foot overlying the distal first metatarsal.  There is some underlying redness and mild surrounding redness, but no tenderness or purulent drainage.  Skin:    General: Skin is warm and dry.  Neurological:     Mental Status: She is alert.      ED Results / Procedures /  Treatments   Labs (all labs ordered are listed, but only abnormal results are displayed) Labs Reviewed - No data to display  EKG None  Radiology No results found.  Procedures Procedures    Medications Ordered in ED Medications - No data to display  ED Course/ Medical Decision Making/ A&P  Patient presenting with swelling and redness of a callus to the bottom of her foot.  She has history of type 1 diabetes and concern is for cellulitis.  X-rays show no evidence for bony erosion that would be suggestive of osteomyelitis.  I will treat patient with Augmentin and have her follow-up as needed.  Final Clinical Impression(s) / ED Diagnoses Final diagnoses:  None    Rx / DC Orders ED Discharge Orders     None         Geoffery Lyons, MD 12/27/22 [phone]

## 2022-12-27 NOTE — ED Triage Notes (Signed)
Pt c/o right foot pain. Pt states that she has a callus that has redness around it. Pt has hx of wound on right foot and needing to see wound care, hx of DM II

## 2022-12-27 NOTE — Discharge Instructions (Signed)
Begin taking Augmentin as prescribed.  Follow-up with primary doctor in the next 3 to 4 days for recheck, and return to the ER if symptoms significantly worsen or change.

## 2023-05-03 ENCOUNTER — Emergency Department (HOSPITAL_COMMUNITY)
Admission: EM | Admit: 2023-05-03 | Discharge: 2023-05-03 | Disposition: A | Discharge status: Home / Self Care | Payer: Medicare HMO | Attending: Emergency Medicine | Admitting: Emergency Medicine

## 2023-05-03 ENCOUNTER — Other Ambulatory Visit: Payer: Self-pay

## 2023-05-03 ENCOUNTER — Encounter (HOSPITAL_COMMUNITY): Payer: Self-pay | Admitting: Emergency Medicine

## 2023-05-03 DIAGNOSIS — Z7982 Long term (current) use of aspirin: Secondary | ICD-10-CM | POA: Diagnosis not present

## 2023-05-03 DIAGNOSIS — W2131XA Struck by shoe cleats, initial encounter: Secondary | ICD-10-CM | POA: Insufficient documentation

## 2023-05-03 DIAGNOSIS — S90421A Blister (nonthermal), right great toe, initial encounter: Secondary | ICD-10-CM | POA: Insufficient documentation

## 2023-05-03 MED ORDER — DOXYCYCLINE HYCLATE 100 MG PO CAPS
100.0000 mg | ORAL_CAPSULE | Freq: Two times a day (BID) | ORAL | 0 refills | Status: DC
Start: 2023-05-03 — End: 2023-09-27

## 2023-05-03 MED ORDER — MUPIROCIN CALCIUM 2 % EX CREA: 1.0000 | TOPICAL_CREAM | Freq: Two times a day (BID) | CUTANEOUS | 0 refills | Status: AC

## 2023-05-03 NOTE — Discharge Instructions (Signed)
Your blister does not appear to be infected however if the wound opens up and becomes tender red or has pus you will need to be on an antibiotic.  Please try taking doxycycline twice a day for 10 days, when the wound opens up you can add mupirocin topically with a sterile dressing, have your family doctor recheck your toe within 3 days or ER for severe or worsening pain swelling redness or fever

## 2023-05-03 NOTE — ED Notes (Signed)
See triage notes. Pt has DM, slight swelling noted with blister formation noted to bottom of right great toe. Pedal pulses present. Nad. No redness noted.

## 2023-05-03 NOTE — ED Provider Notes (Signed)
[place] EMERGENCY DEPARTMENT AT [facility] Provider Note   CSN: [id number] Arrival date & time: 05/03/23  1203     History  Chief Complaint  Patient presents with   Toe Pain    Nicole Golden is a 62 y.o. female.   Toe Pain   62 year old female on insulin for diabetes presenting with right great toe blister after wearing a new pair of shoes within the last week.  She has no fever and no significant pain in the toe actually.  She has been seen by podiatry because of a callus on the bottom of her foot but has not been evaluated for this.  She has no fevers or chills no nausea or vomiting, no swelling of her leg.  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 Authorizing Provider  doxycycline (VIBRAMYCIN) 100 MG capsule Take 1 capsule (100 mg total) by mouth 2 (two) times daily. 05/03/23  Yes Eber Hong, MD  mupirocin cream (BACTROBAN) 2 % Apply 1 Application topically 2 (two) times daily. 05/03/23  Yes Eber Hong, MD  amLODipine (NORVASC) 10 MG tablet Take 10 mg by mouth in the morning.    [provider]  amoxicillin-clavulanate (AUGMENTIN) 500-125 MG tablet Take 1 tablet by mouth every 8 (eight) hours. 12/27/22   Geoffery Lyons, MD  aspirin EC 81 MG tablet Take 81 mg by mouth daily. Swallow whole.    [provider]  Cholecalciferol (VITAMIN D3) 50 MCG (2000 UT) TABS Take 2,000 Units by mouth in the morning.    [provider]  Cyanocobalamin (VITAMIN B-12 PO) Take 1 tablet by mouth in the morning. Gummy    [provider]  dorzolamide-timolol (COSOPT) 22.3-6.8 MG/ML ophthalmic solution Place 1 drop into both eyes in the morning. 08/06/20   [provider]  LANTUS SOLOSTAR 100 UNIT/ML Solostar Pen Inject 40 Units into the skin 2 (two) times  daily. Patient taking differently: Inject 60 Units into the skin 2 (two) times daily. 03/08/21   Erick Blinks, MD  losartan (COZAAR) 100 MG tablet Take 100 mg by mouth in the morning. 06/23/21   [provider]  MOUNJARO 2.5 MG/0.5ML Pen Inject 2.5 mg into the skin every Sunday. 08/16/21   [provider]  NOVOLOG FLEXPEN 100 UNIT/ML FlexPen Inject 15 Units into the skin 3 (three) times daily before meals. 03/04/20   [provider]  rosuvastatin (CRESTOR) 40 MG tablet Take 40 mg by mouth in the morning.    [provider]      Allergies    No healthtouch food allergies    Review of Systems   Review of Systems  All other systems reviewed and are negative.   Physical Exam Updated Vital Signs BP 124/66 (BP Location: Right Arm)   Pulse 87   Temp 98.2 F (36.8 C) (Oral)   Resp 18   SpO2 95%  Physical Exam Vitals and nursing note reviewed.  Constitutional:      General: She is not in acute distress.    Appearance: She is well-developed.  HENT:     Head: Normocephalic and atraumatic.     Mouth/Throat:     Pharynx: No oropharyngeal exudate.  Eyes:     General: No scleral icterus.       Right eye: No  discharge.        Left eye: No discharge.     Conjunctiva/sclera: Conjunctivae normal.     Pupils: Pupils are equal, round, and reactive to light.  Neck:     Thyroid: No thyromegaly.     Vascular: No JVD.  Cardiovascular:     Rate and Rhythm: Normal rate and regular rhythm.     Heart sounds: Normal heart sounds. No murmur heard.    No friction rub. No gallop.  Pulmonary:     Effort: Pulmonary effort is normal. No respiratory distress.     Breath sounds: Normal breath sounds. No wheezing or rales.  Abdominal:     General: Bowel sounds are normal. There is no distension.     Palpations: Abdomen is soft. There is no mass.     Tenderness: There is no abdominal tenderness.  Musculoskeletal:        General: No tenderness. Normal range of motion.      Cervical back: Normal range of motion and neck supple.     Comments: Right great toe with a circumferential blister, nontender, no purulence, it is closed, the toe is not red or swollen otherwise, this is only on the pad of the toe and around the sides of the eponychial folds.  It is not proximal and does not involve the rest of the toe or the foot.  Lymphadenopathy:     Cervical: No cervical adenopathy.  Skin:    General: Skin is warm and dry.     Findings: No erythema or rash.  Neurological:     Mental Status: She is alert.     Coordination: Coordination normal.  Psychiatric:        Behavior: Behavior normal.     ED Results / Procedures / Treatments   Labs (all labs ordered are listed, but only abnormal results are displayed) Labs Reviewed - No data to display  EKG None  Radiology No results found.  Procedures Procedures    Medications Ordered in ED Medications - No data to display  ED Course/ Medical Decision Making/ A&P                                 Medical Decision Making Risk Prescription drug management.   No open wounds, no signs of severe infection, vitals normal, will treat with doxycycline and have her follow-up with podiatry, she is agreeable.        Final Clinical Impression(s) / ED Diagnoses Final diagnoses:  Blister of great toe of right foot, initial encounter    Rx / DC Orders ED Discharge Orders          Ordered    doxycycline (VIBRAMYCIN) 100 MG capsule  2 times daily        05/03/23 1239    mupirocin cream (BACTROBAN) 2 %  2 times daily        05/03/23 1240              Eber Hong, MD 05/03/23 1241

## 2023-05-03 NOTE — ED Triage Notes (Signed)
Pt reports right big toe pain. Pt states she was wearing new shoes and when she got home noticed there was a blister on the toe. Denies fevers or chills.

## 2023-06-05 ENCOUNTER — Ambulatory Visit (HOSPITAL_BASED_OUTPATIENT_CLINIC_OR_DEPARTMENT_OTHER): Payer: Medicare HMO | Admitting: Internal Medicine

## 2023-07-02 ENCOUNTER — Ambulatory Visit (HOSPITAL_BASED_OUTPATIENT_CLINIC_OR_DEPARTMENT_OTHER): Payer: Self-pay | Admitting: General Surgery

## 2023-08-09 NOTE — Progress Notes (Signed)
[facility] - Clinic Note  08/15/2023     CHIEF COMPLAINT Patient presents for Retina Follow Up   HISTORY OF PRESENT ILLNESS: Nicole Golden is a 61 y.o. female who presents to the clinic today for:   HPI     Retina Follow Up   Patient presents with  Diabetic Retinopathy.  In both eyes.  This started 9 months ago.  Duration of 9 months.  Since onset it is stable.  I, the attending physician,  performed the HPI with the patient and updated documentation appropriately.        Comments   9 month retina follow up NPDR pt is reporting vision is blurred at times she denies flashes or floaters pt has appointment with Dr Dione Booze up coming last reading 90 on Monday       Last edited by Rennis Chris, MD on 08/17/2023 12:00 AM.    Pt states she just saw her PCP on Monday, her A1c was 7.0  Referring physician: Karl Ito, DO [address] [place],  [place] [address]  HISTORICAL INFORMATION:   Selected notes from the MEDICAL RECORD NUMBER Referred by [facility] for DM eye exam   CURRENT MEDICATIONS: Current Outpatient Medications (Ophthalmic Drugs)  Medication Sig   dorzolamide-timolol (COSOPT) 22.3-6.8 MG/ML ophthalmic solution Place 1 drop into both eyes in the morning.   No current facility-administered medications for this visit. (Ophthalmic Drugs)   Current Outpatient Medications (Other)  Medication Sig   amLODipine (NORVASC) 10 MG tablet Take 10 mg by mouth in the morning.   amoxicillin-clavulanate (AUGMENTIN) 500-125 MG tablet Take 1 tablet by mouth every 8 (eight) hours.   aspirin EC 81 MG tablet Take 81 mg by mouth daily. Swallow whole.   Cholecalciferol (VITAMIN D3) 50 MCG (2000 UT) TABS Take 2,000 Units by mouth in the morning.   Cyanocobalamin (VITAMIN B-12 PO) Take 1 tablet by mouth in the morning. Gummy   doxycycline (VIBRAMYCIN) 100 MG capsule Take 1 capsule (100 mg total) by mouth 2 (two) times daily.   LANTUS SOLOSTAR 100 UNIT/ML Solostar  Pen Inject 40 Units into the skin 2 (two) times daily. (Patient taking differently: Inject 60 Units into the skin 2 (two) times daily.)   losartan (COZAAR) 100 MG tablet Take 100 mg by mouth in the morning.   MOUNJARO 2.5 MG/0.5ML Pen Inject 2.5 mg into the skin every Sunday.   mupirocin cream (BACTROBAN) 2 % Apply 1 Application topically 2 (two) times daily.   NOVOLOG FLEXPEN 100 UNIT/ML FlexPen Inject 15 Units into the skin 3 (three) times daily before meals.   rosuvastatin (CRESTOR) 40 MG tablet Take 40 mg by mouth in the morning.   No current facility-administered medications for this visit. (Other)   REVIEW OF SYSTEMS: ROS   Positive for: Musculoskeletal, Endocrine, Eyes Negative for: Constitutional, Gastrointestinal, Neurological, Skin, Genitourinary, HENT, Cardiovascular, Respiratory, Psychiatric, Allergic/Imm, Heme/Lymph Last edited by Etheleen Mayhew, COT on 08/15/2023  8:47 AM.      ALLERGIES Allergies  Allergen Reactions   No Healthtouch Food Allergies Itching and Rash    All fruits including tomatoes   PAST MEDICAL HISTORY Past Medical History:  Diagnosis Date   Diabetes mellitus without complication (HCC)    Diabetic neuropathy (HCC)    Diabetic neuropathy (HCC)    Hypertension    Hypertensive retinopathy    OU   Past Surgical History:  Procedure Laterality Date   AMPUTATION TOE Right 03/03/2021   Procedure: AMPUTATION TOE;  Surgeon: Franky Macho, MD;  Location: [facility] ORS;  Service: General;  Laterality: Right;   CATARACT EXTRACTION Bilateral    COLONOSCOPY WITH PROPOFOL N/A 10/04/2021   Procedure: COLONOSCOPY WITH PROPOFOL;  Surgeon: Lanelle Bal, DO;  Location: [facility] ENDO SUITE;  Service: Endoscopy;  Laterality: N/A;  11:00 / ASA 2   EYE SURGERY Bilateral    Cat Sx   FOOT SURGERY     right foot   TUBAL LIGATION     FAMILY HISTORY Family History  Problem Relation Age of Onset   Breast cancer Paternal Aunt    Diabetes Father    Colon cancer Neg Hx     Colon polyps Neg Hx    SOCIAL HISTORY Social History   Tobacco Use   Smoking status: Former   Smokeless tobacco: Never  Vaping Use   Vaping status: Never Used  Substance Use Topics   Alcohol use: Yes    Comment: occas   Drug use: Never       OPHTHALMIC EXAM:  Base Eye Exam     Visual Acuity (Snellen - Linear)       Right Left   Dist [place] 20/20 -1 20/50   Dist ph [place]  20/30 -3         Tonometry (Tonopen, 8:52 AM)       Right Left   Pressure 14 14         Pupils       Pupils Dark Light Shape React APD   Right PERRL 3 2 Round Brisk None   Left PERRL 3 2 Round Brisk None         Visual Fields       Left Right    Full Full         Extraocular Movement       Right Left    Full, Ortho Full, Ortho         Neuro/Psych     Oriented x3: Yes   Mood/Affect: Normal         Dilation     Both eyes: 2.5% Phenylephrine @ 8:52 AM           Slit Lamp and Fundus Exam     Slit Lamp Exam       Right Left   Lids/Lashes Dermatochalasis - upper lid Dermatochalasis - upper lid   Conjunctiva/Sclera Melanosis Melanosis   Cornea Arcus, well healed temporal cataract wounds, trace Punctate epithelial erosions Mild Arcus, well healed temporal cataract wounds, trace Punctate epithelial erosions, +endo pigment, Krukenberg spindle -- very mild   Anterior Chamber deep and clear Deep, 0.5+fine pigment   Iris Round and dilated, No NVI Round and dilated, No NVI, no TIDs.   Lens PC IOL in good position PC IOL in good position, ?tilt, mild pigment deposition   Anterior Vitreous Mild syneresis Mild Vitreous syneresis         Fundus Exam       Right Left   Disc Pallor, Sharp rim, +PPA/PPP, +SVP Mild pallor, Sharp rim, mild PPA, focal fibrosis @ 0600   C/D Ratio 0.2 0.2   Macula Flat, good foveal reflex, Retinal pigment epithelial mottling and clumping, scattered MA temporal macula -- improved, no edema Flat, good foveal reflex, Retinal pigment epithelial  mottling, No heme or edema   Vessels attenuated, Tortuous, copper wiring attenuated, Tortuous, mild copper wiring, mild AV crossing changes   Periphery Attached, mild reticular degeneration, no heme, peripheral drusen Attached, no heme, peripheral drusen  IMAGING AND PROCEDURES  Imaging and Procedures for @TODAY @  OCT, Retina - OU - Both Eyes       Right Eye Quality was good. Central Foveal Thickness: 252. Progression has been stable. Findings include normal foveal contour, no IRF, no SRF, vitreomacular adhesion (stable improvement in IRF IT mac).   Left Eye Quality was good. Central Foveal Thickness: 267. Progression has been stable. Findings include normal foveal contour, no IRF, no SRF, vitreomacular adhesion (Partial PVD).   Notes *Images captured and stored on drive  Diagnosis / Impression:  No frank DME OU OD: stable improvement in IRF IT mac  Clinical management:  See below  Abbreviations: NFP - Normal foveal profile. CME - cystoid macular edema. PED - pigment epithelial detachment. IRF - intraretinal fluid. SRF - subretinal fluid. EZ - ellipsoid zone. ERM - epiretinal membrane. ORA - outer retinal atrophy. ORT - outer retinal tubulation. SRHM - subretinal hyper-reflective material            ASSESSMENT/PLAN:    ICD-10-CM   1. Moderate nonproliferative diabetic retinopathy of both eyes without macular edema associated with type 2 diabetes mellitus (HCC)  E11.3393 OCT, Retina - OU - Both Eyes    2. Current use of insulin (HCC)  Z79.4     3. Long-term (current) use of injectable non-insulin antidiabetic drugs  Z79.85     4. Pigment dispersion syndrome of left eye  H21.232     5. Essential hypertension  I10     6. Hypertensive retinopathy of both eyes  H35.033     7. Pseudophakia of both eyes  Z96.1      1-3. Moderate nonproliferative diabetic retinopathy w/o DME OU -- stable - A1C 7.0 on 12.16.24 per pt report - FA 02.13.23- late leaking MA OU,  no NV OU - exam shows scattered MA/IRH OU - OCT without diabetic macular edema OU - f/u in 9-12 months DFE, OCT  4. Pigment dispersion syndrome OS  - BCVA 20/30 OS today  - exam shows scattered fine pigment in [facility] and ant vit + Krukenberg spindle -- stable from prior  - IOL OS with mild tilt -- ?etiology of PDS  - IOP okay at 14  - continue drops as directed by Dr. Dione Booze    Prednisolone qd OS, Latanoprost qhs OS, Cosopt qd OS  - discussed findings, prognosis             - monitor  5,6. Hypertensive retinopathy OU  - discussed importance of tight BP control  - monitor  7. Pseudophakia OU  - s/p CE/IOL both eyes [facility])  - doing well  - ? Mild tilt of PCIOL OS  - monitor  Ophthalmic Meds Ordered this visit:  No orders of the defined types were placed in this encounter.    Return for f/u 9-12 months, NPDR OU, DFE, OCT.  There are no Patient Instructions on file for this visit.  This document serves as a record of services personally performed by Karie Chimera, MD, PhD. It was created on their behalf by De Blanch, an ophthalmic technician. The creation of this record is the provider's dictation and/or activities during the visit.    Electronically signed by: De Blanch, OA, 08/17/23  12:02 AM  This document serves as a record of services personally performed by Karie Chimera, MD, PhD. It was created on their behalf by Glee Arvin. Manson Passey, OA an ophthalmic technician. The creation of this record is the provider's dictation and/or activities  during the visit.    Electronically signed by: Glee Arvin. Manson Passey, OA 08/17/23 12:02 AM   Karie Chimera, M.D., Ph.D. Diseases & Surgery of the Retina and Vitreous [facility]  I have reviewed the above documentation for accuracy and completeness, and I agree with the above. Karie Chimera, M.D., Ph.D. 08/17/23 12:03 AM   Abbreviations: M myopia (nearsighted); A astigmatism; H hyperopia  (farsighted); P presbyopia; Mrx spectacle prescription;  CTL contact lenses; OD right eye; OS left eye; OU both eyes  XT exotropia; ET esotropia; PEK punctate epithelial keratitis; PEE punctate epithelial erosions; DES dry eye syndrome; MGD meibomian gland dysfunction; ATs artificial tears; PFAT's preservative free artificial tears; NSC nuclear sclerotic cataract; PSC posterior subcapsular cataract; ERM epi-retinal membrane; PVD posterior vitreous detachment; RD retinal detachment; DM diabetes mellitus; DR diabetic retinopathy; NPDR non-proliferative diabetic retinopathy; PDR proliferative diabetic retinopathy; CSME clinically significant macular edema; DME diabetic macular edema; dbh dot blot hemorrhages; CWS cotton wool spot; POAG primary open angle glaucoma; C/D cup-to-disc ratio; HVF humphrey visual field; GVF goldmann visual field; OCT optical coherence tomography; IOP intraocular pressure; BRVO Branch retinal vein occlusion; CRVO central retinal vein occlusion; CRAO central retinal artery occlusion; BRAO branch retinal artery occlusion; RT retinal tear; SB scleral buckle; PPV pars plana vitrectomy; VH Vitreous hemorrhage; PRP panretinal laser photocoagulation; IVK intravitreal kenalog; VMT vitreomacular traction; MH Macular hole;  NVD neovascularization of the disc; NVE neovascularization elsewhere; AREDS age related eye disease study; ARMD age related macular degeneration; POAG primary open angle glaucoma; EBMD epithelial/anterior basement membrane dystrophy; ACIOL anterior chamber intraocular lens; IOL intraocular lens; PCIOL posterior chamber intraocular lens; Phaco/IOL phacoemulsification with intraocular lens placement; PRK photorefractive keratectomy; LASIK laser assisted in situ keratomileusis; HTN hypertension; DM diabetes mellitus; COPD chronic obstructive pulmonary disease

## 2023-08-15 ENCOUNTER — Encounter (INDEPENDENT_AMBULATORY_CARE_PROVIDER_SITE_OTHER): Payer: Self-pay | Admitting: Ophthalmology

## 2023-08-15 ENCOUNTER — Ambulatory Visit (INDEPENDENT_AMBULATORY_CARE_PROVIDER_SITE_OTHER): Payer: Medicare PPO | Admitting: Ophthalmology

## 2023-08-15 DIAGNOSIS — H21232 Degeneration of iris (pigmentary), left eye: Secondary | ICD-10-CM

## 2023-08-15 DIAGNOSIS — E113393 Type 2 diabetes mellitus with moderate nonproliferative diabetic retinopathy without macular edema, bilateral: Secondary | ICD-10-CM | POA: Diagnosis not present

## 2023-08-15 DIAGNOSIS — Z794 Long term (current) use of insulin: Secondary | ICD-10-CM

## 2023-08-15 DIAGNOSIS — I10 Essential (primary) hypertension: Secondary | ICD-10-CM

## 2023-08-15 DIAGNOSIS — Z7985 Long-term (current) use of injectable non-insulin antidiabetic drugs: Secondary | ICD-10-CM

## 2023-08-15 DIAGNOSIS — H35033 Hypertensive retinopathy, bilateral: Secondary | ICD-10-CM

## 2023-08-15 DIAGNOSIS — Z961 Presence of intraocular lens: Secondary | ICD-10-CM

## 2023-08-17 ENCOUNTER — Encounter (INDEPENDENT_AMBULATORY_CARE_PROVIDER_SITE_OTHER): Payer: Self-pay | Admitting: Ophthalmology

## 2023-09-26 ENCOUNTER — Emergency Department (HOSPITAL_COMMUNITY): Payer: Medicare PPO

## 2023-09-26 ENCOUNTER — Other Ambulatory Visit: Payer: Self-pay

## 2023-09-26 ENCOUNTER — Encounter (HOSPITAL_COMMUNITY): Payer: Self-pay

## 2023-09-26 ENCOUNTER — Observation Stay (HOSPITAL_COMMUNITY)
Admission: EM | Admit: 2023-09-26 | Discharge: 2023-09-27 | Disposition: A | Discharge status: Home / Self Care | Payer: Medicare PPO | Attending: Emergency Medicine | Admitting: Emergency Medicine

## 2023-09-26 DIAGNOSIS — M869 Osteomyelitis, unspecified: Secondary | ICD-10-CM | POA: Diagnosis present

## 2023-09-26 DIAGNOSIS — I10 Essential (primary) hypertension: Secondary | ICD-10-CM | POA: Diagnosis not present

## 2023-09-26 DIAGNOSIS — Z87891 Personal history of nicotine dependence: Secondary | ICD-10-CM | POA: Insufficient documentation

## 2023-09-26 DIAGNOSIS — Z7982 Long term (current) use of aspirin: Secondary | ICD-10-CM | POA: Insufficient documentation

## 2023-09-26 DIAGNOSIS — E785 Hyperlipidemia, unspecified: Secondary | ICD-10-CM | POA: Diagnosis not present

## 2023-09-26 DIAGNOSIS — E1140 Type 2 diabetes mellitus with diabetic neuropathy, unspecified: Secondary | ICD-10-CM | POA: Diagnosis not present

## 2023-09-26 DIAGNOSIS — E1142 Type 2 diabetes mellitus with diabetic polyneuropathy: Secondary | ICD-10-CM | POA: Diagnosis not present

## 2023-09-26 DIAGNOSIS — E1165 Type 2 diabetes mellitus with hyperglycemia: Secondary | ICD-10-CM | POA: Insufficient documentation

## 2023-09-26 LAB — CBC WITH DIFFERENTIAL/PLATELET
Abs Immature Granulocytes: 0.03 10*3/uL (ref 0.00–0.07)
Basophils Absolute: 0.1 10*3/uL (ref 0.0–0.1)
Basophils Relative: 1 %
Eosinophils Absolute: 0.3 10*3/uL (ref 0.0–0.5)
Eosinophils Relative: 4 %
HCT: 38.4 % (ref 36.0–46.0)
Hemoglobin: 12 g/dL (ref 12.0–15.0)
Immature Granulocytes: 1 %
Lymphocytes Relative: 34 %
Lymphs Abs: 2.2 10*3/uL (ref 0.7–4.0)
MCH: 25.1 pg — ABNORMAL LOW (ref 26.0–34.0)
MCHC: 31.3 g/dL (ref 30.0–36.0)
MCV: 80.2 fL (ref 80.0–100.0)
Monocytes Absolute: 0.4 10*3/uL (ref 0.1–1.0)
Monocytes Relative: 7 %
Neutro Abs: 3.6 10*3/uL (ref 1.7–7.7)
Neutrophils Relative %: 53 %
Platelets: 232 10*3/uL (ref 150–400)
RBC: 4.79 MIL/uL (ref 3.87–5.11)
RDW: 13.5 % (ref 11.5–15.5)
WBC: 6.6 10*3/uL (ref 4.0–10.5)
nRBC: 0 % (ref 0.0–0.2)

## 2023-09-26 LAB — COMPREHENSIVE METABOLIC PANEL
ALT: 32 U/L (ref 0–44)
AST: 22 U/L (ref 15–41)
Albumin: 3.7 g/dL (ref 3.5–5.0)
Alkaline Phosphatase: 64 U/L (ref 38–126)
Anion gap: 6 (ref 5–15)
BUN: 25 mg/dL — ABNORMAL HIGH (ref 8–23)
CO2: 26 mmol/L (ref 22–32)
Calcium: 9.6 mg/dL (ref 8.9–10.3)
Chloride: 105 mmol/L (ref 98–111)
Creatinine, Ser: 1.14 mg/dL — ABNORMAL HIGH (ref 0.44–1.00)
GFR, Estimated: 54 mL/min — ABNORMAL LOW (ref >60)
Glucose, Bld: 214 mg/dL — ABNORMAL HIGH (ref 70–99)
Potassium: 3.6 mmol/L (ref 3.5–5.1)
Sodium: 137 mmol/L (ref 135–145)
Total Bilirubin: 0.5 mg/dL (ref 0.0–1.2)
Total Protein: 7.2 g/dL (ref 6.5–8.1)

## 2023-09-26 LAB — LACTIC ACID, PLASMA: Lactic Acid, Venous: 0.7 mmol/L (ref 0.5–1.9)

## 2023-09-26 LAB — BASIC METABOLIC PANEL
Anion gap: 9 (ref 5–15)
BUN: 25 mg/dL — ABNORMAL HIGH (ref 8–23)
CO2: 26 mmol/L (ref 22–32)
Calcium: 9.6 mg/dL (ref 8.9–10.3)
Chloride: 104 mmol/L (ref 98–111)
Creatinine, Ser: 1.22 mg/dL — ABNORMAL HIGH (ref 0.44–1.00)
GFR, Estimated: 50 mL/min — ABNORMAL LOW (ref >60)
Glucose, Bld: 179 mg/dL — ABNORMAL HIGH (ref 70–99)
Potassium: 3.4 mmol/L — ABNORMAL LOW (ref 3.5–5.1)
Sodium: 139 mmol/L (ref 135–145)

## 2023-09-26 MED ORDER — PIPERACILLIN-TAZOBACTAM 3.375 G IVPB
3.3750 g | Freq: Three times a day (TID) | INTRAVENOUS | Status: DC
  Administered 2023-09-27 (×2): 3.375 g via INTRAVENOUS
  Filled 2023-09-26 (×2): qty 50

## 2023-09-26 MED ORDER — ACETAMINOPHEN 650 MG RE SUPP: 650.0000 mg | Freq: Four times a day (QID) | RECTAL | Status: DC | PRN

## 2023-09-26 MED ORDER — VANCOMYCIN HCL 1500 MG/300ML IV SOLN
1500.0000 mg | Freq: Once | INTRAVENOUS | Status: AC
  Administered 2023-09-27: 1500 mg via INTRAVENOUS
  Filled 2023-09-26: qty 300

## 2023-09-26 MED ORDER — ONDANSETRON HCL 4 MG/2ML IJ SOLN: 4.0000 mg | Freq: Four times a day (QID) | INTRAMUSCULAR | Status: DC | PRN

## 2023-09-26 MED ORDER — INSULIN GLARGINE-YFGN 100 UNIT/ML ~~LOC~~ SOLN
40.0000 [IU] | Freq: Two times a day (BID) | SUBCUTANEOUS | Status: DC
  Administered 2023-09-27: 40 [IU] via SUBCUTANEOUS
  Filled 2023-09-26 (×4): qty 0.4

## 2023-09-26 MED ORDER — VITAMIN B-12 1000 MCG PO TABS
500.0000 ug | ORAL_TABLET | Freq: Every day | ORAL | Status: DC
  Administered 2023-09-27: 500 ug via ORAL
  Filled 2023-09-26: qty 1

## 2023-09-26 MED ORDER — PIPERACILLIN-TAZOBACTAM 3.375 G IVPB 30 MIN
3.3750 g | Freq: Once | INTRAVENOUS | Status: DC
Start: 2023-09-26 — End: 2023-09-26

## 2023-09-26 MED ORDER — ROSUVASTATIN CALCIUM 20 MG PO TABS
40.0000 mg | ORAL_TABLET | Freq: Every morning | ORAL | Status: DC
  Administered 2023-09-27: 40 mg via ORAL
  Filled 2023-09-26: qty 2

## 2023-09-26 MED ORDER — DORZOLAMIDE HCL-TIMOLOL MAL 2-0.5 % OP SOLN
1.0000 [drp] | Freq: Every morning | OPHTHALMIC | Status: DC
  Filled 2023-09-26: qty 10

## 2023-09-26 MED ORDER — LOSARTAN POTASSIUM 25 MG PO TABS
100.0000 mg | ORAL_TABLET | Freq: Every morning | ORAL | Status: DC
  Administered 2023-09-27: 100 mg via ORAL
  Filled 2023-09-26: qty 4

## 2023-09-26 MED ORDER — GADOBUTROL 1 MMOL/ML IV SOLN
7.2000 mL | Freq: Once | INTRAVENOUS | Status: AC | PRN
  Administered 2023-09-26: 7.2 mL via INTRAVENOUS

## 2023-09-26 MED ORDER — SODIUM CHLORIDE 0.9 % IV SOLN: INTRAVENOUS | Status: DC

## 2023-09-26 MED ORDER — MAGNESIUM HYDROXIDE 400 MG/5ML PO SUSP: 30.0000 mL | Freq: Every day | ORAL | Status: DC | PRN

## 2023-09-26 MED ORDER — ENOXAPARIN SODIUM 40 MG/0.4ML IJ SOSY
40.0000 mg | PREFILLED_SYRINGE | INTRAMUSCULAR | Status: DC
  Administered 2023-09-27: 40 mg via SUBCUTANEOUS
  Filled 2023-09-26: qty 0.4

## 2023-09-26 MED ORDER — MUPIROCIN CALCIUM 2 % EX CREA
1.0000 | TOPICAL_CREAM | Freq: Two times a day (BID) | CUTANEOUS | Status: DC
  Filled 2023-09-26: qty 15

## 2023-09-26 MED ORDER — ONDANSETRON HCL 4 MG PO TABS: 4.0000 mg | ORAL_TABLET | Freq: Four times a day (QID) | ORAL | Status: DC | PRN

## 2023-09-26 MED ORDER — AMLODIPINE BESYLATE 5 MG PO TABS
10.0000 mg | ORAL_TABLET | Freq: Every day | ORAL | Status: DC
  Administered 2023-09-27: 10 mg via ORAL
  Filled 2023-09-26: qty 2

## 2023-09-26 MED ORDER — ACETAMINOPHEN 325 MG PO TABS: 650.0000 mg | ORAL_TABLET | Freq: Four times a day (QID) | ORAL | Status: DC | PRN

## 2023-09-26 MED ORDER — TRAZODONE HCL 50 MG PO TABS
25.0000 mg | ORAL_TABLET | Freq: Every evening | ORAL | Status: DC | PRN
Start: 2023-09-26 — End: 2023-09-27

## 2023-09-26 MED ORDER — VITAMIN D 25 MCG (1000 UNIT) PO TABS
2000.0000 [IU] | ORAL_TABLET | Freq: Every morning | ORAL | Status: DC
  Administered 2023-09-27: 2000 [IU] via ORAL
  Filled 2023-09-26: qty 2

## 2023-09-26 NOTE — H&P (Signed)
[facility]   PATIENT NAME: Nicole Golden    MR#:  [id number]  DATE OF BIRTH:  [birth year]  DATE OF ADMISSION:  09/26/2023  PRIMARY CARE PHYSICIAN: Karl Ito, DO   Patient is coming from: Home  REQUESTING/REFERRING PHYSICIAN: Lunette Stands, PA-C   CHIEF COMPLAINT:   Right big toe bleeding  HISTORY OF PRESENT ILLNESS:  Nicole Golden is a 63 y.o. female with medical history significant for type 2 diabetes mellitus and diabetic neuropathy and retinopathy, with chronic right big toe wound that has been followed by podiatry who thought to recently be adequately healing.  She noticed on Sunday and wound on her right big toe that is darted bleeding after working all day for 8 hours.  She has been intermittently off of work for the last few months to she thought that it completely healed.  No fever or chills.  No nausea or vomiting or abdominal pain.  No chest pain or palpitations.  No cough or wheezing or dyspnea.  No dysuria, oliguria or hematuria or flank pain.  No other bleeding diathesis.  ED Course: When the patient came to the ER, BP was 143/70 and later 157/70 with otherwise normal vital signs.  Labs revealed a BUN of 25 with a creatinine of 1.22 with a blood glucose of 179 and potassium 3.4.  CBC was unremarkable.  Blood cultures were drawn.  Lactic acid was 0.7  EKG as reviewed by me :  Imaging:  Right foot x-ray showed the following: 1. Soft tissue ulcer at the plantar, mid to distal aspect of the great toe distal phalanx. Possible minimal cortical erosion within the adjacent plantar aspect of the mid to distal portion of the distal phalanx on lateral view compared to 12/27/2022 radiograph. This may represent early osteomyelitis. 2. Postsurgical changes of amputation of the fifth digit to the level of the mid aspect of the metatarsal head. MRI of the right foot with contrast is currently pending.  The patient was placed on IV vancomycin and Zosyn.  She will be  admitted to a medical-surgical bed at [facility] for podiatry evaluation and further management of her osteomyelitis. PAST MEDICAL HISTORY:   Past Medical History:  Diagnosis Date   Diabetes mellitus without complication (HCC)    Diabetic neuropathy (HCC)    Diabetic neuropathy (HCC)    Hypertension    Hypertensive retinopathy    OU    PAST SURGICAL HISTORY:   Past Surgical History:  Procedure Laterality Date   AMPUTATION TOE Right 03/03/2021   Procedure: AMPUTATION TOE;  Surgeon: Franky Macho, MD;  Location: [facility] ORS;  Service: General;  Laterality: Right;   CATARACT EXTRACTION Bilateral    COLONOSCOPY WITH PROPOFOL N/A 10/04/2021   Procedure: COLONOSCOPY WITH PROPOFOL;  Surgeon: Lanelle Bal, DO;  Location: [facility] ENDO SUITE;  Service: Endoscopy;  Laterality: N/A;  11:00 / ASA 2   EYE SURGERY Bilateral    Cat Sx   FOOT SURGERY     right foot   TUBAL LIGATION      SOCIAL HISTORY:   Social History   Tobacco Use   Smoking status: Former   Smokeless tobacco: Never  Substance Use Topics   Alcohol use: Yes    Comment: occas    FAMILY HISTORY:   Family History  Problem Relation Age of Onset   Breast cancer Paternal Aunt    Diabetes Father    Colon cancer Neg Hx    Colon polyps Neg Hx  DRUG ALLERGIES:   Allergies  Allergen Reactions   No Healthtouch Food Allergies Itching and Rash    All fruits including tomatoes    REVIEW OF SYSTEMS:   ROS As per history of present illness. All pertinent systems were reviewed above. Constitutional, HEENT, cardiovascular, respiratory, GI, GU, musculoskeletal, neuro, psychiatric, endocrine, integumentary and hematologic systems were reviewed and are otherwise negative/unremarkable except for positive findings mentioned above in the HPI.   MEDICATIONS AT HOME:   Prior to Admission medications   Medication Sig Start Date End Date Taking? Authorizing Provider  amLODipine (NORVASC) 10 MG tablet Take 10 mg by mouth in the morning.     [provider]  amoxicillin-clavulanate (AUGMENTIN) 500-125 MG tablet Take 1 tablet by mouth every 8 (eight) hours. 12/27/22   Geoffery Lyons, MD  aspirin EC 81 MG tablet Take 81 mg by mouth daily. Swallow whole.    [provider]  Cholecalciferol (VITAMIN D3) 50 MCG (2000 UT) TABS Take 2,000 Units by mouth in the morning.    [provider]  Cyanocobalamin (VITAMIN B-12 PO) Take 1 tablet by mouth in the morning. Gummy    [provider]  dorzolamide-timolol (COSOPT) 22.3-6.8 MG/ML ophthalmic solution Place 1 drop into both eyes in the morning. 08/06/20   [provider]  doxycycline (VIBRAMYCIN) 100 MG capsule Take 1 capsule (100 mg total) by mouth 2 (two) times daily. 05/03/23   Eber Hong, MD  LANTUS SOLOSTAR 100 UNIT/ML Solostar Pen Inject 40 Units into the skin 2 (two) times daily. Patient taking differently: Inject 60 Units into the skin 2 (two) times daily. 03/08/21   Erick Blinks, MD  losartan (COZAAR) 100 MG tablet Take 100 mg by mouth in the morning. 06/23/21   [provider]  MOUNJARO 2.5 MG/0.5ML Pen Inject 2.5 mg into the skin every Sunday. 08/16/21   [provider]  mupirocin cream (BACTROBAN) 2 % Apply 1 Application topically 2 (two) times daily. 05/03/23   Eber Hong, MD  NOVOLOG FLEXPEN 100 UNIT/ML FlexPen Inject 15 Units into the skin 3 (three) times daily before meals. 03/04/20   [provider]  rosuvastatin (CRESTOR) 40 MG tablet Take 40 mg by mouth in the morning.    [provider]      VITAL SIGNS:  Blood pressure (!) 197/78, pulse 84, temperature 97.8 F (36.6 C), temperature source Oral, resp. rate 16, height 5\' 7"  (1.702 m), weight 72.1 kg, SpO2 100%.  PHYSICAL EXAMINATION:  Physical Exam  GENERAL:  63 y.o.-year-old African-American female patient lying in the bed with no acute distress.  EYES: Pupils equal, round, reactive to light and accommodation. No scleral icterus. Extraocular  muscles intact.  HEENT: Head atraumatic, normocephalic. Oropharynx and nasopharynx clear.  NECK:  Supple, no jugular venous distention. No thyroid enlargement, no tenderness.  LUNGS: Normal breath sounds bilaterally, no wheezing, rales,rhonchi or crepitation. No use of accessory muscles of respiration.  CARDIOVASCULAR: Regular rate and rhythm, S1, S2 normal. No murmurs, rubs, or gallops.  ABDOMEN: Soft, nondistended, nontender. Bowel sounds present. No organomegaly or mass.  EXTREMITIES: No pedal edema, cyanosis, or clubbing.  NEUROLOGIC: Cranial nerves II through XII are intact. Muscle strength 5/5 in all extremities. Sensation intact. Gait not checked.  PSYCHIATRIC: The patient is alert and oriented x 3.  Normal affect and good eye contact. SKIN: Diffuse right big toe swelling with plantar wound, mild erythema without tenderness.     LABORATORY PANEL:   CBC Recent Labs  Lab 09/26/23 1717  WBC 6.6  HGB 12.0  HCT 38.4  PLT 232   ------------------------------------------------------------------------------------------------------------------  Chemistries  Recent Labs  Lab 09/26/23 2310  NA 137  K 3.6  CL 105  CO2 26  GLUCOSE 214*  BUN 25*  CREATININE 1.14*  CALCIUM 9.6  AST 22  ALT 32  ALKPHOS 64  BILITOT 0.5   ------------------------------------------------------------------------------------------------------------------  Cardiac Enzymes No results for input(s): "TROPONINI" in the last 168 hours. ------------------------------------------------------------------------------------------------------------------  RADIOLOGY:  DG Foot Complete Right Result Date: 09/26/2023 CLINICAL DATA:  Diabetic foot ulcer.  Chronic right great toe wound. EXAM: RIGHT FOOT COMPLETE - 3+ VIEW COMPARISON:  Right foot radiographs 12/27/2022 FINDINGS: Postsurgical changes are again seen of amputation of the fifth digit to the level of the mid aspect of the metatarsal head. The  amputation cortices remain sharp and intact. There is lucency from soft tissue ulcer at the plantar, mid to distal aspect of the great toe distal phalanx. Possible minimal approximately 1 mm cortical erosion within the adjacent plantar aspect of the mid to distal portion of the distal phalanx on lateral view compared to 12/27/2022 radiograph. Moderate plantar calcaneal heel spur. Mild-to-moderate midfoot joint space narrowing and dorsal osteophytosis. IMPRESSION: 1. Soft tissue ulcer at the plantar, mid to distal aspect of the great toe distal phalanx. Possible minimal cortical erosion within the adjacent plantar aspect of the mid to distal portion of the distal phalanx on lateral view compared to 12/27/2022 radiograph. This may represent early osteomyelitis. 2. Postsurgical changes of amputation of the fifth digit to the level of the mid aspect of the metatarsal head. Electronically Signed   By: Neita Garnet M.D.   On: 09/26/2023 17:34      IMPRESSION AND PLAN:  Assessment and Plan: * Toe osteomyelitis, right [facility]) - Patient will be admitted to a medical bed at [facility]. - We will continue antibiotic therapy with IV vancomycin and Zosyn. - Podiatry consult will be obtained. - I sent a times message for Dr. Annamary Rummage to notify him about the patient  Type 2 diabetes mellitus with peripheral neuropathy (HCC) - The patient will be placed on supplemental coverage with NovoLog. - We will continue basal coverage.  Dyslipidemia - We will Continue statin therapy.  Essential hypertension - We will Continue antihypertensive therapy and add as needed IV labetalol.   DVT prophylaxis: Lovenox.  Advanced Care Planning:  Code Status: full code.  Family Communication:  The plan of care was discussed in details with the patient (and family). I answered all questions. The patient agreed to proceed with the above mentioned plan. Further management will depend upon hospital course. Disposition Plan: Back to  previous home environment Consults called: Podiatry All the records are reviewed and case discussed with ED provider.  Status is: Inpatient   At the time of the admission, it appears that the appropriate admission status for this patient is inpatient.  This is judged to be reasonable and necessary in order to provide the required intensity of service to ensure the patient's safety given the presenting symptoms, physical exam findings and initial radiographic and laboratory data in the context of comorbid conditions.  The patient requires inpatient status due to high intensity of service, high risk of further deterioration and high frequency of surveillance required.  I certify that at the time of admission, it is my clinical judgment that the patient will require inpatient hospital care extending more than 2 midnights.  Dispo: The patient is from: Home              Anticipated d/c is to: Home              Patient currently is not medically stable to d/c.              Difficult to place patient: No  Hannah Beat M.D on 09/27/2023 at 3:39 AM  [facility]   From 7 PM-7 AM, contact night-coverage [email or web address]  CC: Primary care physician; Karl Ito, DO

## 2023-09-26 NOTE — ED Triage Notes (Signed)
Patient presents to ED with chronic wound to R big toe. Patient states it is not healing. She has been caring for it for the last 3 months with oral ABX & topical. Her podiatrist has been in charge of care, she was released by him last Thursday, she went back to work Sunday and the wound opened up.   Wound is a small triangle, with serosanguinous secretions. No redness or pain.

## 2023-09-26 NOTE — Progress Notes (Signed)
Pharmacy Antibiotic Note  Nicole Golden is a 63 y.o. female admitted on 09/26/2023 with  osteomyelitis, necrotizing fascitis .  Pharmacy has been consulted for Zosyn dosing.  Plan: Zosyn 3.375g IV q8h (4 hour infusion).  Height: 5\' 7"  (170.2 cm) Weight: 72.1 kg (159 lb) IBW/kg (Calculated) : 61.6  Temp (24hrs), Avg:97.8 F (36.6 C), Min:97.8 F (36.6 C), Max:97.8 F (36.6 C)  Recent Labs  Lab 09/26/23 1717  WBC 6.6  CREATININE 1.22*    Estimated Creatinine Clearance: 45.3 mL/min (A) (by C-G formula based on SCr of 1.22 mg/dL (H)).    Allergies  Allergen Reactions   No Healtht[employer] Food Allergies Itching and Rash    All fruits including tomatoes    Antimicrobials this admission:   >>    >>   Dose adjustments this admission:   Microbiology results:  BCx:   UCx:    Sputum:    MRSA PCR:   Thank you for allowing pharmacy to be a part of this patient's care.  Yaviel Kloster D 09/26/2023 11:20 PM

## 2023-09-26 NOTE — ED Provider Notes (Signed)
[place] EMERGENCY DEPARTMENT AT [facility] Provider Note   CSN: [id number] Arrival date & time: 09/26/23  1202     History  No chief complaint on file.   Nicole Golden is a 63 y.o. female.  HPI Pt presents to the ED complaining of unhealing wound to great toe of R foot. Pt was released from podiatry last Thursday. Went back to work Sunday where she states that the wound started to bleed again. States that it started to drain. Currently being treated abx gel and abx. Able to ambulate w/out difficulty.   Has f/u appointment in 6 weeks w/ podiatry.   Endorses chronic numbness.  Denies fevers, chest pain, shortness of breath, abdominal pain, n/v, dysuria, LE swelling, LE pain.     Home Medications Prior to Admission medications   Medication Sig Start Date End Date Taking? Authorizing Provider  amLODipine (NORVASC) 10 MG tablet Take 10 mg by mouth in the morning.    [provider]  amoxicillin-clavulanate (AUGMENTIN) 500-125 MG tablet Take 1 tablet by mouth every 8 (eight) hours. 12/27/22   Geoffery Lyons, MD  aspirin EC 81 MG tablet Take 81 mg by mouth daily. Swallow whole.    [provider]  Cholecalciferol (VITAMIN D3) 50 MCG (2000 UT) TABS Take 2,000 Units by mouth in the morning.    [provider]  Cyanocobalamin (VITAMIN B-12 PO) Take 1 tablet by mouth in the morning. Gummy    [provider]  dorzolamide-timolol (COSOPT) 22.3-6.8 MG/ML ophthalmic solution Place 1 drop into both eyes in the morning. 08/06/20   [provider]  doxycycline (VIBRAMYCIN) 100 MG capsule Take 1 capsule (100 mg total) by mouth 2 (two) times daily. 05/03/23   Eber Hong, MD  LANTUS SOLOSTAR 100 UNIT/ML Solostar Pen Inject 40 Units into the skin 2 (two) times daily. Patient taking differently: Inject 60 Units into the skin 2 (two) times daily. 03/08/21   Erick Blinks, MD  losartan (COZAAR) 100 MG tablet Take 100 mg by mouth in the morning.  06/23/21   [provider]  MOUNJARO 2.5 MG/0.5ML Pen Inject 2.5 mg into the skin every Sunday. 08/16/21   [provider]  mupirocin cream (BACTROBAN) 2 % Apply 1 Application topically 2 (two) times daily. 05/03/23   Eber Hong, MD  NOVOLOG FLEXPEN 100 UNIT/ML FlexPen Inject 15 Units into the skin 3 (three) times daily before meals. 03/04/20   [provider]  rosuvastatin (CRESTOR) 40 MG tablet Take 40 mg by mouth in the morning.    [provider]      Allergies    No healthtouch food allergies    Review of Systems   Review of Systems  Skin:  Positive for wound.  All other systems reviewed and are negative.   Physical Exam Updated Vital Signs BP (!) 157/70 (BP Location: Right Arm)   Pulse 79   Temp 97.8 F (36.6 C) (Oral)   Resp 16   Ht 5\' 7"  (1.702 m)   Wt 72.1 kg   SpO2 98%   BMI 24.90 kg/m  Physical Exam Vitals and nursing note reviewed.  Constitutional:      Appearance: Normal appearance.  HENT:     Head: Normocephalic and atraumatic.  Eyes:     Extraocular Movements: Extraocular movements intact.     Conjunctiva/sclera: Conjunctivae normal.  Cardiovascular:     Rate and Rhythm: Normal rate and regular rhythm.     Pulses: Normal pulses.  Heart sounds: Normal heart sounds. No murmur heard.    No friction rub. No gallop.  Pulmonary:     Effort: Pulmonary effort is normal. No respiratory distress.     Breath sounds: Normal breath sounds.  Abdominal:     General: Abdomen is flat.     Palpations: Abdomen is soft.     Tenderness: There is no abdominal tenderness.  Musculoskeletal:        General: No swelling, tenderness or deformity.     Right lower leg: No edema.     Left lower leg: No edema.  Skin:    General: Skin is warm and dry.     Coloration: Skin is not jaundiced or pale.     Findings: Lesion present. No erythema.  Neurological:     General: No focal deficit present.     Mental Status: She is alert. Mental  status is at baseline.  Psychiatric:        Mood and Affect: Mood normal.        ED Results / Procedures / Treatments   Labs (all labs ordered are listed, but only abnormal results are displayed) Labs Reviewed  CBC WITH DIFFERENTIAL/PLATELET - Abnormal; Notable for the following components:      Result Value   MCH 25.1 (*)    All other components within normal limits  BASIC METABOLIC PANEL - Abnormal; Notable for the following components:   Potassium 3.4 (*)    Glucose, Bld 179 (*)    BUN 25 (*)    Creatinine, Ser 1.22 (*)    GFR, Estimated 50 (*)    All other components within normal limits  COMPREHENSIVE METABOLIC PANEL - Abnormal; Notable for the following components:   Glucose, Bld 214 (*)    BUN 25 (*)    Creatinine, Ser 1.14 (*)    GFR, Estimated 54 (*)    All other components within normal limits  CULTURE, BLOOD (ROUTINE X 2)  CULTURE, BLOOD (ROUTINE X 2)  LACTIC ACID, PLASMA  HIV ANTIBODY (ROUTINE TESTING W REFLEX)  BASIC METABOLIC PANEL  CBC    EKG None  Radiology DG Foot Complete Right Result Date: 09/26/2023 CLINICAL DATA:  Diabetic foot ulcer.  Chronic right great toe wound. EXAM: RIGHT FOOT COMPLETE - 3+ VIEW COMPARISON:  Right foot radiographs 12/27/2022 FINDINGS: Postsurgical changes are again seen of amputation of the fifth digit to the level of the mid aspect of the metatarsal head. The amputation cortices remain sharp and intact. There is lucency from soft tissue ulcer at the plantar, mid to distal aspect of the great toe distal phalanx. Possible minimal approximately 1 mm cortical erosion within the adjacent plantar aspect of the mid to distal portion of the distal phalanx on lateral view compared to 12/27/2022 radiograph. Moderate plantar calcaneal heel spur. Mild-to-moderate midfoot joint space narrowing and dorsal osteophytosis. IMPRESSION: 1. Soft tissue ulcer at the plantar, mid to distal aspect of the great toe distal phalanx. Possible minimal  cortical erosion within the adjacent plantar aspect of the mid to distal portion of the distal phalanx on lateral view compared to 12/27/2022 radiograph. This may represent early osteomyelitis. 2. Postsurgical changes of amputation of the fifth digit to the level of the mid aspect of the metatarsal head. Electronically Signed   By: Neita Garnet M.D.   On: 09/26/2023 17:34    Procedures Procedures   Medications Ordered in ED Medications  amLODipine (NORVASC) tablet 10 mg (has no administration in time range)  losartan (  COZAAR) tablet 100 mg (has no administration in time range)  rosuvastatin (CRESTOR) tablet 40 mg (has no administration in time range)  insulin glargine-yfgn (SEMGLEE) injection 40 Units (40 Units Subcutaneous Not Given 09/26/23 2339)  cyanocobalamin (VITAMIN B12) tablet 500 mcg (has no administration in time range)  cholecalciferol (VITAMIN D3) 25 MCG (1000 UNIT) tablet 2,000 Units (has no administration in time range)  dorzolamide-timolol (COSOPT) 2-0.5 % ophthalmic solution 1 drop (has no administration in time range)  mupirocin cream (BACTROBAN) 2 % 1 Application (1 Application Topical Not Given 09/26/23 2339)  enoxaparin (LOVENOX) injection 40 mg (has no administration in time range)  0.9 %  sodium chloride infusion (has no administration in time range)  acetaminophen (TYLENOL) tablet 650 mg (has no administration in time range)    Or  acetaminophen (TYLENOL) suppository 650 mg (has no administration in time range)  traZODone (DESYREL) tablet 25 mg (has no administration in time range)  magnesium hydroxide (MILK OF MAGNESIA) suspension 30 mL (has no administration in time range)  ondansetron (ZOFRAN) tablet 4 mg (has no administration in time range)    Or  ondansetron (ZOFRAN) injection 4 mg (has no administration in time range)  piperacillin-tazobactam (ZOSYN) IVPB 3.375 g (has no administration in time range)  vancomycin (VANCOREADY) IVPB 1500 mg/300 mL (has no  administration in time range)  gadobutrol (GADAVIST) 1 MMOL/ML injection 7.2 mL (7.2 mLs Intravenous Contrast Given 09/26/23 1917)    ED Course/ Medical Decision Making/ A&P                                 Medical Decision Making Amount and/or Complexity of Data Reviewed Labs: ordered. Radiology: ordered.  Risk Prescription drug management.   This patient is a 63 year old female who presents to the ED for concern of chronic wound to right big toe that is unhealing x 3 months.   Differential diagnoses prior to evaluation: The emergent differential diagnosis includes, but is not limited to, diabetic foot ulcer, cellulitis, osteomyelitis, PAD, limb ischemia. This is not an exhaustive differential.   Past Medical History / Co-morbidities / Social History: DKA, type 2 diabetes, HTN, cellulitis/abscess/osteomyelitis and right foot, gangrene of toe on right foot  Additional history: Chart reviewed. Pertinent results include: Was seen by retinal specialist on 08/15/2023 and shown to have moderate nonproliferative diabetic retinopathy.  Lab Tests/Imaging studies: I personally interpreted labs/imaging and the pertinent results include:   CBC was unremarkable BMP showed a mildly increased creatinine of 1.22 which CMP pending Blood cultures pending Lactate pending.  X-ray of right foot was suspicious for osteomyelitis MRI right foot was positive for osteomyelitis I agree with the radiologist interpretation.  Medications: I ordered medication including vancomycin.  I have reviewed the patients home medicines and have made adjustments as needed.  Critical Interventions:  ED Course:   Patient is a 63 year old female presents the ED today complaining of a nonhealing diabetic foot ulcer on right great toe that has reopened 3 days ago.  Patient was previously seen by podiatry and cleared last Thursday.  However when she resumed work, patient noted that the wound reopened.  Currently taking  antibiotics and using topical antibiotics.  Has a scheduled follow-up appointment in 6 weeks however is trying to get seen earlier. Endorses chronic numbness Denies fever,, pain, immobility.   On physical exam, notable diabetic ulcer noted without any erythema, swelling, pain to palpation. With notable granulation tissue noted.  Some  serosanguineous drainage noted.  Due to possible risk of osteomyelitis, x-ray was ordered and confirmatory MRI was then also ordered.  Radiology was then called to expedite reading.  Radiology then called back and said that it was positive for osteomyelitis.  Further labs were then drawn including lactate, CMP, cultures.  Will start vancomycin 15 mg/kg due to her having creatinine clearance 53.  Hospitalist was then consulted.  Hospitalist stated that he would start the vancomycin and contact podiatry for possible surgical intervention.  Care was then transferred over to Dr. Arville Care.    Disposition: After consideration of the diagnostic results and the patients response to treatment, I feel that patient benefit from admission and care noted as above.  Patient care was then transferred over to Dr. Arville Care.   Final Clinical Impression(s) / ED Diagnoses Final diagnoses:  Osteomyelitis of great toe [facility])    Rx / DC Orders ED Discharge Orders     None         Lavonia Drafts 09/26/23 2343    Vanetta Mulders, MD 09/28/23 [phone]

## 2023-09-27 DIAGNOSIS — E1142 Type 2 diabetes mellitus with diabetic polyneuropathy: Secondary | ICD-10-CM | POA: Insufficient documentation

## 2023-09-27 DIAGNOSIS — M869 Osteomyelitis, unspecified: Secondary | ICD-10-CM | POA: Diagnosis not present

## 2023-09-27 DIAGNOSIS — E785 Hyperlipidemia, unspecified: Secondary | ICD-10-CM | POA: Insufficient documentation

## 2023-09-27 LAB — BASIC METABOLIC PANEL
Anion gap: 11 (ref 5–15)
BUN: 23 mg/dL (ref 8–23)
CO2: 22 mmol/L (ref 22–32)
Calcium: 9.1 mg/dL (ref 8.9–10.3)
Chloride: 108 mmol/L (ref 98–111)
Creatinine, Ser: 1.11 mg/dL — ABNORMAL HIGH (ref 0.44–1.00)
GFR, Estimated: 56 mL/min — ABNORMAL LOW (ref >60)
Glucose, Bld: 172 mg/dL — ABNORMAL HIGH (ref 70–99)
Potassium: 3.4 mmol/L — ABNORMAL LOW (ref 3.5–5.1)
Sodium: 141 mmol/L (ref 135–145)

## 2023-09-27 LAB — CBC
HCT: 36.5 % (ref 36.0–46.0)
Hemoglobin: 11.6 g/dL — ABNORMAL LOW (ref 12.0–15.0)
MCH: 26 pg (ref 26.0–34.0)
MCHC: 31.8 g/dL (ref 30.0–36.0)
MCV: 81.7 fL (ref 80.0–100.0)
Platelets: 214 10*3/uL (ref 150–400)
RBC: 4.47 MIL/uL (ref 3.87–5.11)
RDW: 13.6 % (ref 11.5–15.5)
WBC: 6.2 10*3/uL (ref 4.0–10.5)
nRBC: 0 % (ref 0.0–0.2)

## 2023-09-27 LAB — HEMOGLOBIN A1C
Hgb A1c MFr Bld: 9.2 % — ABNORMAL HIGH (ref 4.8–5.6)
Mean Plasma Glucose: 217.34 mg/dL

## 2023-09-27 LAB — HIV ANTIBODY (ROUTINE TESTING W REFLEX): HIV Screen 4th Generation wRfx: NONREACTIVE

## 2023-09-27 LAB — CBG MONITORING, ED: Glucose-Capillary: 134 mg/dL — ABNORMAL HIGH (ref 70–99)

## 2023-09-27 MED ORDER — VANCOMYCIN HCL 1250 MG/250ML IV SOLN: 1250.0000 mg | INTRAVENOUS | Status: DC

## 2023-09-27 MED ORDER — DOXYCYCLINE HYCLATE 100 MG PO TABS: 100.0000 mg | ORAL_TABLET | Freq: Two times a day (BID) | ORAL | 0 refills | Status: AC

## 2023-09-27 MED ORDER — LABETALOL HCL 5 MG/ML IV SOLN: 20.0000 mg | INTRAVENOUS | Status: DC | PRN

## 2023-09-27 MED ORDER — INSULIN ASPART 100 UNIT/ML IJ SOLN
0.0000 [IU] | Freq: Three times a day (TID) | INTRAMUSCULAR | Status: DC
  Administered 2023-09-27: 3 [IU] via SUBCUTANEOUS
  Filled 2023-09-27: qty 1

## 2023-09-27 MED ORDER — AMOXICILLIN-POT CLAVULANATE 875-125 MG PO TABS: 1.0000 | ORAL_TABLET | Freq: Two times a day (BID) | ORAL | 0 refills | Status: AC

## 2023-09-27 NOTE — Assessment & Plan Note (Signed)
-  The patient will be placed on supplemental coverage with NovoLog. - We will continue basal coverage.

## 2023-09-27 NOTE — Discharge Summary (Signed)
Physician Discharge Summary  Nicole Golden ZOX:[id number] DOB: [birth year] DOA: 09/26/2023  PCP: Karl Ito, DO  Admit date: 09/26/2023 Discharge date: 09/27/2023  Admitted From: Home Disposition: Home  Recommendations for Outpatient Follow-up:  Follow up with PCP in 1-2 weeks Follow-up with podiatry, Dr. 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  Patient denies any purulent discharge, no erythema and no pain.  In ED, temperature 97.8 F, HR 94, RR 18, BP 143/70, SpO2 100% on room air.  WBC 6.6, hemoglobin 12.0, platelet count 232.  Sodium 139, potassium 3.4, chloride 104, CO2 26, glucose 179, BUN 25, creatinine 1.22.  Right foot x-ray with soft tissue ulcer plantar, mid-distal aspect of the great toe distal phalanx, possible minimal cortical erosion within the adjacent plantar aspect of the mid-distal portion of the distal phalanx on the lateral view could represent early osteomyelitis, postsurgical changes of amputation fifth digit to the level of the metatarsal head.  Patient was started on IV vancomycin and Zosyn.  TRH consulted for admission for further evaluation management of concern for osteomyelitis.  Hospital course:  Right great toe diabetic foot infection/cellulitis Concern for early osteomyelitis Patient presenting to ED with concern over reopening of wound to the  plantar surface of her right great toe.  Patient describes some oozing blood from the site but denies any purulent discharge, no erythema, no fluctuance or pain.  Follows with podiatry outpatient, Dr. Adam Phenix in [facility].  Patient was afebrile without leukocytosis.  Initial x-ray with findings of soft tissue ulceration and questionable cortical erosion of the right great toe distal phalanx.  Patient was initially started on IV vancomycin and Zosyn.  MR right foot with focal soft tissue ulceration medial/plantar aspect distal great toe with surrounding soft tissue edema, enhancement consistent with cellulitis, no evidence of focal fluid collection or soft tissue emphysema, signal changes within the distal phalanx great toe suspicious for early osteomyelitis, no other significant osseous findings.  Discussed with general surgery, Dr. Wyatt Mage; no urgency for amputation at this time with recommendation of antibiotic treatment and close outpatient follow-up with her podiatrist.  Will discharge on doxycycline and Augmentin x 2 weeks.  Patient encouraged to make follow-up appoint with her podiatrist within 1 week for further evaluation and recommendations on need to extend antibiotic course further.  Type 2 diabetes mellitus, with hyperglycemia Hemoglobin A1c, 9.2 correlates with poor control.  Continue Lantus 40 units subcutaneously twice daily, NovoLog 15 units 3 times daily AC.  Outpatient follow-up with PCP.  Essential hypertension Amlodipine 10 mg p.o. daily, losartan 100 mg p.o. daily.  Continue aspirin and statin  Hyperlipidemia Crestor 40 mg p.o. daily  Discharge Diagnoses:  Principal Problem:   Toe osteomyelitis, right (HCC) Active Problems:   Essential hypertension   Dyslipidemia   Type 2 diabetes mellitus with peripheral neuropathy [facility])    Discharge Instructions  Discharge Instructions     Call MD for:  difficulty breathing, headache or visual disturbances  Complete  by: As directed    Call MD for:  extreme fatigue   Complete by: As directed    Call MD for:  persistant dizziness or light-headedness   Complete by: As directed    Call MD for:  persistant nausea and vomiting   Complete by: As directed    Call MD for:  severe uncontrolled pain   Complete by: As directed    Call MD for:  temperature >100.4   Complete by: As directed    Diet - low sodium heart healthy   Complete by: As directed    Increase activity slowly   Complete by: As directed       Allergies as of 09/27/2023       Reactions   No [employer] Food Allergies Itching, Rash   All fruits including tomatoes        Medication List     STOP taking these medications    amoxicillin-clavulanate 500-125 MG tablet Commonly known as: Augmentin Replaced by: amoxicillin-clavulanate 875-125 MG tablet   doxycycline 100 MG capsule Commonly known as: VIBRAMYCIN Replaced by: doxycycline 100 MG tablet       TAKE these medications    amLODipine 10 MG tablet Commonly known as: NORVASC Take 10 mg by mouth in the morning.   amoxicillin-clavulanate 875-125 MG tablet Commonly known as: AUGMENTIN Take 1 tablet by mouth 2 (two) times daily for 14 days. Replaces: amoxicillin-clavulanate 500-125 MG tablet   aspirin EC 81 MG tablet Take 81 mg by mouth daily. Swallow whole.   dorzolamide-timolol 2-0.5 % ophthalmic solution Commonly known as: COSOPT Place 1 drop into both eyes in the morning.   doxycycline 100 MG tablet Commonly known as: VIBRA-TABS Take 1 tablet (100 mg total) by mouth 2 (two) times daily for 14 days. Replaces: doxycycline 100 MG capsule   Lantus SoloStar 100 UNIT/ML Solostar Pen Generic drug: insulin glargine Inject 40 Units into the skin 2 (two) times daily. What changed: how much to take   losartan 100 MG tablet Commonly known as: COZAAR Take 100 mg by mouth in the morning.   Mounjaro 2.5 MG/0.5ML Pen Generic drug: tirzepatide Inject 2.5 mg into the  skin every Sunday.   mupirocin cream 2 % Commonly known as: BACTROBAN Apply 1 Application topically 2 (two) times daily.   NovoLOG FlexPen 100 UNIT/ML FlexPen Generic drug: insulin aspart Inject 15 Units into the skin 3 (three) times daily before meals.   rosuvastatin 40 MG tablet Commonly known as: CRESTOR Take 40 mg by mouth in the morning.   VITAMIN B-12 PO Take 1 tablet by mouth in the morning. Gummy   Vitamin D3 50 MCG (2000 UT) Tabs Take 2,000 Units by mouth in the morning.        Follow-up Information     Karl Ito, DO. Schedule an appointment as soon as possible for a visit in 1 week(s).   Specialty: General Practice Contact information: [address] [place] [address] [phone]         Adam Phenix, DPM. Schedule an appointment as soon as possible for a visit in 1 week(s).   Specialty: Podiatry Contact information: [address] [place] [address] [phone]                Allergies  Allergen Reactions   No [employer] Food Allergies Itching and Rash    All fruits including tomatoes    Consultations: Discussed with general surgery, Dr. Robyne Peers   Procedures/Studies: MR FOOT RIGHT W  CONTRAST Result Date: 09/27/2023 CLINICAL DATA:  Diabetic foot ulcer. Great toe ulcer. History 5th digit amputation. Possible osteomyelitis on radiographs. EXAM: MRI OF THE RIGHT FOREFOOT WITH CONTRAST TECHNIQUE: Multiplanar, multisequence MR imaging of the right forefoot was performed following the administration of intravenous contrast. CONTRAST:  7.57mL GADAVIST GADOBUTROL 1 MMOL/ML IV SOLN COMPARISON:  Radiographs 09/26/2023 and 12/27/2022. MRI of the hindfoot 03/07/2021. FINDINGS: Bones/Joint/Cartilage As demonstrated radiographically, there is focal soft tissue ulceration along the medial and plantar aspect of the distal great toe. Soft tissue findings further described below. There are signal changes within the distal phalanx of the great toe  with mild marrow T2 hyperintensity and enhancement. There is also mildly decreased cortical and adjacent subcortical T1 signal without gross cortical destruction. Based on adjacent soft tissue findings and previous radiographs, the findings are suspicious for early osteomyelitis. No significant interphalangeal joint effusion. The proximal 1st phalanx appears normal. The 5th toe has been previously amputated. Stable chronic postsurgical deformity of the 5th metatarsal head without associated marrow signal abnormality. No significant abnormality of the additional toes or metatarsals. Ligaments The Lisfranc ligament is intact. The collateral ligaments of the remaining metatarsophalangeal joints are intact. Muscles and Tendons Severe muscular atrophy throughout the foot, consistent with underlying diabetes. No suspicious enhancement or focal fluid collection. The forefoot tendons appear intact without significant tenosynovitis. Soft tissues As above, focal soft tissue ulceration along the medial and plantar aspect of the great toe with surrounding soft tissue edema and enhancement. No evidence of focal fluid collection, foreign body or soft tissue emphysema. No other significant soft tissue findings are identified. IMPRESSION: 1. Focal soft tissue ulceration along the medial and plantar aspect of the distal great toe with surrounding soft tissue edema and enhancement consistent with cellulitis. No evidence of focal fluid collection or soft tissue emphysema. 2. Signal changes within the distal phalanx of the great toe suspicious for early osteomyelitis. 3. No other significant osseous findings. 4. Severe muscular atrophy throughout the foot, consistent with underlying diabetes. Electronically Signed   By: Carey Bullocks M.D.   On: 09/27/2023 08:28   DG Foot Complete Right Result Date: 09/26/2023 CLINICAL DATA:  Diabetic foot ulcer.  Chronic right great toe wound. EXAM: RIGHT FOOT COMPLETE - 3+ VIEW COMPARISON:  Right  foot radiographs 12/27/2022 FINDINGS: Postsurgical changes are again seen of amputation of the fifth digit to the level of the mid aspect of the metatarsal head. The amputation cortices remain sharp and intact. There is lucency from soft tissue ulcer at the plantar, mid to distal aspect of the great toe distal phalanx. Possible minimal approximately 1 mm cortical erosion within the adjacent plantar aspect of the mid to distal portion of the distal phalanx on lateral view compared to 12/27/2022 radiograph. Moderate plantar calcaneal heel spur. Mild-to-moderate midfoot joint space narrowing and dorsal osteophytosis. IMPRESSION: 1. Soft tissue ulcer at the plantar, mid to distal aspect of the great toe distal phalanx. Possible minimal cortical erosion within the adjacent plantar aspect of the mid to distal portion of the distal phalanx on lateral view compared to 12/27/2022 radiograph. This may represent early osteomyelitis. 2. Postsurgical changes of amputation of the fifth digit to the level of the mid aspect of the metatarsal head. Electronically Signed   By: Neita Garnet M.D.   On: 09/26/2023 17:34     Subjective: Patient seen examined bedside, resting calmly.  Remains in ED holding area.  No specific complaints this morning.  Discussed with general surgery, no need for urgent  amputation with recommendation of antibiotic treatment and outpatient follow-up with her established podiatrist.  Patient agreeable to plans given questionable bony involvement on imaging.  Recommended patient make follow-up appointment with her podiatrist, Dr. Adam Phenix within 1 week.  No other specific questions, concerns or complaints at this time.  Denies headache, no visual changes, no chest pain, no palpitations, no shortness of breath, no abdominal pain, no fever/chills/night sweats, no nausea/vomiting/diarrhea, no focal weakness, no paresthesias.  No acute events overnight per nursing.  Discharging home.  Discharge  Exam: Vitals:   09/26/23 2230 09/27/23 0643  BP: (!) 197/78 (!) 140/63  Pulse: 84 81  Resp: 16 18  Temp:  98.2 F (36.8 C)  SpO2: 100% 99%   Vitals:   09/26/23 1422 09/26/23 2127 09/26/23 2230 09/27/23 0643  BP:  (!) 157/70 (!) 197/78 (!) 140/63  Pulse:  79 84 81  Resp:  16 16 18   Temp:    98.2 F (36.8 C)  TempSrc:    Oral  SpO2:  98% 100% 99%  Weight: 72.1 kg     Height: 5\' 7"  (1.702 m)       Physical Exam: GEN: NAD, alert and oriented x 3, chronically ill in appearance HEENT: NCAT, PERRL, EOMI, sclera clear, MMM PULM: CTAB w/o wheezes/crackles, normal respiratory effort, on room air CV: RRR w/o M/G/R GI: abd soft, NTND, NABS, no R/G/M MSK: no peripheral edema, muscle strength globally intact 5/5 bilateral upper/lower extremities NEURO: CN II-XII intact, no focal deficits, sensation to light touch intact diminished bilateral plantar surfaces of feet PSYCH: normal mood/affect Integumentary: Right great toe mild edema with plantar wound, nontender to palpation, depicted as below, otherwise no other concerning rashes/lesions/wounds noted on exposed skin surfaces.       The results of significant diagnostics from this hospitalization (including imaging, microbiology, ancillary and laboratory) are listed below for reference.     Microbiology: Recent Results (from the past 240 hours)  Blood Cultures x 2 sites     Status: None (Preliminary result)   Collection Time: 09/26/23 11:10 PM   Specimen: BLOOD  Result Value Ref Range Status   Specimen Description BLOOD BLOOD RIGHT ARM  Final   Special Requests   Final    BOTTLES DRAWN AEROBIC AND ANAEROBIC Blood Culture adequate volume   Culture   Final    NO GROWTH < 12 HOURS Performed at [facility], [address] [place], [place] [address]    Report Status PENDING  Incomplete  Blood Cultures x 2 sites     Status: None (Preliminary result)   Collection Time: 09/26/23 11:13 PM   Specimen: BLOOD  Result Value Ref Range  Status   Specimen Description BLOOD BLOOD RIGHT HAND  Final   Special Requests   Final    BOTTLES DRAWN AEROBIC AND ANAEROBIC Blood Culture adequate volume   Culture   Final    NO GROWTH < 12 HOURS Performed at [facility], [address], [place], [place] [address]    Report Status PENDING  Incomplete     Labs: BNP (last 3 results) No results for input(s): "BNP" in the last 8760 hours. Basic Metabolic Panel: Recent Labs  Lab 09/26/23 1717 09/26/23 2310 09/27/23 0341  NA 139 137 141  K 3.4* 3.6 3.4*  CL 104 105 108  CO2 26 26 22   GLUCOSE 179* 214* 172*  BUN 25* 25* 23  CREATININE 1.22* 1.14* 1.11*  CALCIUM 9.6 9.6 9.1   Liver Function Tests: Recent Labs  Lab 09/26/23 2310  AST 22  ALT 32  ALKPHOS 64  BILITOT 0.5  PROT 7.2  ALBUMIN 3.7   No results for input(s): "LIPASE", "AMYLASE" in the last 168 hours. No results for input(s): "AMMONIA" in the last 168 hours. CBC: Recent Labs  Lab 09/26/23 1717 09/27/23 0341  WBC 6.6 6.2  NEUTROABS 3.6  --   HGB 12.0 11.6*  HCT 38.4 36.5  MCV 80.2 81.7  PLT 232 214   Cardiac Enzymes: No results for input(s): "CKTOTAL", "CKMB", "CKMBINDEX", "TROPONINI" in the last 168 hours. BNP: Invalid input(s): "POCBNP" CBG: Recent Labs  Lab 09/27/23 0837  GLUCAP 134*   D-Dimer No results for input(s): "DDIMER" in the last 72 hours. Hgb A1c Recent Labs    09/26/23 2313  HGBA1C 9.2*   Lipid Profile No results for input(s): "CHOL", "HDL", "LDLCALC", "TRIG", "CHOLHDL", "LDLDIRECT" in the last 72 hours. Thyroid function studies No results for input(s): "TSH", "T4TOTAL", "T3FREE", "THYROIDAB" in the last 72 hours.  Invalid input(s): "FREET3" Anemia work up No results for input(s): "VITAMINB12", "FOLATE", "FERRITIN", "TIBC", "IRON", "RETICCTPCT" in the last 72 hours. Urinalysis    Component Value Date/Time   COLORURINE YELLOW 03/01/2021 2300   APPEARANCEUR HAZY (A) 03/01/2021 2300   LABSPEC 1.015 03/01/2021 2300    PHURINE 5.0 03/01/2021 2300   GLUCOSEU >=500 (A) 03/01/2021 2300   HGBUR SMALL (A) 03/01/2021 2300   BILIRUBINUR NEGATIVE 03/01/2021 2300   KETONESUR NEGATIVE 03/01/2021 2300   PROTEINUR >=300 (A) 03/01/2021 2300   NITRITE NEGATIVE 03/01/2021 2300   LEUKOCYTESUR NEGATIVE 03/01/2021 2300   Sepsis Labs Recent Labs  Lab 09/26/23 1717 09/27/23 0341  WBC 6.6 6.2   Microbiology Recent Results (from the past 240 hours)  Blood Cultures x 2 sites     Status: None (Preliminary result)   Collection Time: 09/26/23 11:10 PM   Specimen: BLOOD  Result Value Ref Range Status   Specimen Description BLOOD BLOOD RIGHT ARM  Final   Special Requests   Final    BOTTLES DRAWN AEROBIC AND ANAEROBIC Blood Culture adequate volume   Culture   Final    NO GROWTH < 12 HOURS Performed at [facility], [address], [place], [place] [address]    Report Status PENDING  Incomplete  Blood Cultures x 2 sites     Status: None (Preliminary result)   Collection Time: 09/26/23 11:13 PM   Specimen: BLOOD  Result Value Ref Range Status   Specimen Description BLOOD BLOOD RIGHT HAND  Final   Special Requests   Final    BOTTLES DRAWN AEROBIC AND ANAEROBIC Blood Culture adequate volume   Culture   Final    NO GROWTH < 12 HOURS Performed at [facility], [address], [place], [place] [address]    Report Status PENDING  Incomplete     Time coordinating discharge: Over 30 minutes  SIGNED:   Alvira Philips [place], DO  [facility] 09/27/2023, 10:13 AM

## 2023-09-27 NOTE — Care Management Obs Status (Signed)
MEDICARE OBSERVATION STATUS NOTIFICATION   Patient Details  Name: TIMEKA GOETTE MRN: [id number] Date of Birth: [birth year]   Medicare Observation Status Notification Given:  Yes    Elliot Gault, LCSW 09/27/2023, 10:35 AM

## 2023-09-27 NOTE — Assessment & Plan Note (Addendum)
-   We will Continue antihypertensive therapy and add as needed IV labetalol.

## 2023-09-27 NOTE — Progress Notes (Signed)
Pharmacy Antibiotic Note  Nicole Golden is a 63 y.o. female admitted on 09/26/2023 with  osteomyelitis .  Pharmacy has been consulted for vancomycin dosing.  Plan: Vancomycin 1500mg  x1 then 1250mg  IV Q. Goal AUC 400-550.  Expected AUC 540.  Height: 5\' 7"  (170.2 cm) Weight: 72.1 kg (159 lb) IBW/kg (Calculated) : 61.6  Temp (24hrs), Avg:97.8 F (36.6 C), Min:97.8 F (36.6 C), Max:97.8 F (36.6 C)  Recent Labs  Lab 09/26/23 1717 09/26/23 2310  WBC 6.6  --   CREATININE 1.22* 1.14*  LATICACIDVEN  --  0.7    Estimated Creatinine Clearance: 48.5 mL/min (A) (by C-G formula based on SCr of 1.14 mg/dL (H)).    Allergies  Allergen Reactions   No [employer] Food Allergies Itching and Rash    All fruits including tomatoes    Thank you for allowing pharmacy to be a part of this patient's care.  Vernard Gambles, PharmD, BCPS  09/27/2023 12:06 AM

## 2023-09-27 NOTE — Progress Notes (Signed)
   09/27/23 0948  TOC Brief Assessment  Insurance and Status Reviewed  Patient has primary care physician Yes  Home environment has been reviewed from home  Prior level of function: independent  Prior/Current Home Services No current home services  Social Drivers of Health Review SDOH reviewed no interventions necessary  Readmission risk has been reviewed Yes  Transition of care needs no transition of care needs at this time    Pt with osteomyelitis of the big toe per MD and she may transfer to [facility] for care. TOC will follow and assist if dc planning needs arise.  Transition of Care Department [facility]) has reviewed patient and no immediate TOC needs have been identified at this time. We will continue to monitor patient advancement through interdisciplinary progression rounds. If new patient transition needs arise, please place a TOC consult.

## 2023-09-27 NOTE — Care Management CC44 (Signed)
Condition Code 44 Documentation Completed  Patient Details  Name: TEKELA GARGUILO MRN: [id number] Date of Birth: September 10, 1960   Condition Code 44 given:  Yes Patient signature on Condition Code 44 notice:  Yes Documentation of 2 MD's agreement:  Yes Code 44 added to claim:  Yes    Elliot Gault, LCSW 09/27/2023, 10:35 AM

## 2023-09-27 NOTE — Assessment & Plan Note (Signed)
-   Patient will be admitted to a medical bed at [facility]. - We will continue antibiotic therapy with IV vancomycin and Zosyn. - Podiatry consult will be obtained. - I sent a times message for Dr. Annamary Rummage to notify him about the patient

## 2023-09-27 NOTE — Assessment & Plan Note (Signed)
-  We will Continue statin therapy.

## 2023-10-01 LAB — CULTURE, BLOOD (ROUTINE X 2)
Culture: NO GROWTH
Culture: NO GROWTH
Special Requests: ADEQUATE
Special Requests: ADEQUATE

## 2023-11-06 ENCOUNTER — Other Ambulatory Visit: Payer: Self-pay | Admitting: Registered Nurse

## 2023-11-06 DIAGNOSIS — M86171 Other acute osteomyelitis, right ankle and foot: Secondary | ICD-10-CM

## 2023-11-18 ENCOUNTER — Ambulatory Visit
Admission: RE | Admit: 2023-11-18 | Discharge: 2023-11-18 | Disposition: A | Discharge status: Home / Self Care | Source: Ambulatory Visit | Attending: Registered Nurse | Admitting: Registered Nurse

## 2023-11-18 DIAGNOSIS — M86171 Other acute osteomyelitis, right ankle and foot: Secondary | ICD-10-CM

## 2023-12-18 ENCOUNTER — Other Ambulatory Visit: Payer: Self-pay

## 2023-12-18 ENCOUNTER — Ambulatory Visit (INDEPENDENT_AMBULATORY_CARE_PROVIDER_SITE_OTHER): Admitting: Internal Medicine

## 2023-12-18 ENCOUNTER — Encounter: Payer: Self-pay | Admitting: Internal Medicine

## 2023-12-18 VITALS — BP 152/76 | HR 105 | Temp 97.9°F | Ht 67.0 in | Wt 158.0 lb

## 2023-12-18 DIAGNOSIS — M869 Osteomyelitis, unspecified: Secondary | ICD-10-CM

## 2023-12-18 MED ORDER — DOXYCYCLINE HYCLATE 100 MG PO CAPS: 100.0000 mg | ORAL_CAPSULE | Freq: Two times a day (BID) | ORAL | 0 refills | Status: DC

## 2023-12-18 MED ORDER — AMOXICILLIN-POT CLAVULANATE 875-125 MG PO TABS: 1.0000 | ORAL_TABLET | Freq: Two times a day (BID) | ORAL | 1 refills | Status: DC

## 2023-12-18 NOTE — Progress Notes (Signed)
 Patient Active Problem List   Diagnosis Date Noted   Dyslipidemia 09/27/2023   Type 2 diabetes mellitus with peripheral neuropathy (HCC) 09/27/2023   Toe osteomyelitis, right (HCC) 09/26/2023   Encounter for screening colonoscopy 02/15/2022   Gangrene of toe of right foot (HCC)    Sepsis due to Rt Foot cellulitis/Abscess/OsteoMyelitis 03/01/2021   Cellulitis and Abscess and Osteomyelitis in diabetic foot ---Rt 03/01/2021   Gallstones 03/01/2021   Nausea & vomiting 02/28/2021   Essential hypertension 02/28/2021   Idiopathic acute pancreatitis without infection or necrosis    Constipation 05/08/2018   DKA (diabetic ketoacidosis) (HCC) 04/24/2016   Abdominal wall cellulitis 04/24/2016   Type II diabetes mellitus with complication, uncontrolled 04/24/2016    Patient's Medications  New Prescriptions   No medications on file  Previous Medications   AMLODIPINE  (NORVASC ) 10 MG TABLET    Take 10 mg by mouth in the morning.   ASPIRIN  EC 81 MG TABLET    Take 81 mg by mouth daily. Swallow whole.   CHOLECALCIFEROL  (VITAMIN D3) 50 MCG (2000 UT) TABS    Take 2,000 Units by mouth in the morning.   CYANOCOBALAMIN  (VITAMIN B-12 PO)    Take 1 tablet by mouth in the morning. Gummy   DORZOLAMIDE -TIMOLOL  (COSOPT ) 22.3-6.8 MG/ML OPHTHALMIC SOLUTION    Place 1 drop into both eyes in the morning.   LANTUS  SOLOSTAR 100 UNIT/ML SOLOSTAR PEN    Inject 40 Units into the skin 2 (two) times daily.   LOSARTAN  (COZAAR ) 100 MG TABLET    Take 100 mg by mouth in the morning.   MOUNJARO 2.5 MG/0.5ML PEN    Inject 2.5 mg into the skin every Sunday.   MUPIROCIN  CREAM (BACTROBAN ) 2 %    Apply 1 Application topically 2 (two) times daily.   NOVOLOG  FLEXPEN 100 UNIT/ML FLEXPEN    Inject 15 Units into the skin 3 (three) times daily before meals.   ROSUVASTATIN  (CRESTOR ) 40 MG TABLET    Take 40 mg by mouth in the morning.  Modified Medications   No medications on file  Discontinued Medications   No  medications on file    Subjective: 63 year old female with history of diabetes mellitus complicated neuropathy, hypertension presents for right first distal phalanx osteomyelitis.  Patient was admitted once/2-1/30 for right great toe diabetic foot infection/cellulitis.  She presented with concern of reopening of wound on the plantar surface of her right great toe.  At that time MRI right foot showed focal soft tissue ulceration and concern for osteo early osteomyelitis of distal phalanx.  She was prescribed Doxy and Augmentin  x 2 weeks.  Followed by podiatry. Seen by Dr. Nguyen NP on 3/10 at that time noted that needed MRI follow-up.  MRI on 4/2 showed mild bone marrow edema of tuft of first distal phalanx concerning for osteomyelitis.  Referred to infectious disease. Today 12/18/23 : Discussed the use of AI scribe software for clinical note transcription with the patient, who gave verbal consent to proceed. Seda J Woodstock is a 63 year old female with diabetes who presents with a diabetic foot infection and possible bone infection. She was referred by her podiatrist, Ashok Blake, for evaluation of a diabetic foot infection and possible bone infection.  She has a history of a foot wound that began as a blister in September. Despite initial treatment with an antibiotic and cream by her podiatrist, the wound reopened, causing bleeding at work and necessitating time off.  Although it seemed to heal, it reopened again, leading to further bleeding.  In January, an MRI indicated a persistent infection, and she was given a two-week course of antibiotics. A recent MRI at the end of March still shows signs of a bone infection, although she reports no pain from the wound itself. She completed the two-week antibiotic course prescribed during a hospital visit and was subsequently prescribed doxycycline  and Augmentin  by her primary care doctor, which she started two weeks ago. She is currently on doxycycline  and  Augmentin , with a change in the Augmentin  dose, to be taken twice daily until May 19th.  She has diabetes, which increases her susceptibility to infections. Her diabetes management has improved, with her A1c decreasing from 9.5 to 8. She has been working with a nutritionist and changing her diet. She experiences neuropathic pain in both feet, which she describes as 'the same pain I have every day of my life,' and notes that it is not related to the current infection.  She cannot stand for long periods and the wound has not drained in over two months. She recalls going to the emergency room previously due to concerns about her foot infection, where she requested imaging because 'nobody wasn't doing nothing.' Review of Systems: Review of Systems  All other systems reviewed and are negative.   Past Medical History:  Diagnosis Date   Diabetes mellitus without complication (HCC)    Diabetic neuropathy (HCC)    Diabetic neuropathy (HCC)    Hypertension    Hypertensive retinopathy    OU    Social History   Tobacco Use   Smoking status: Former   Smokeless tobacco: Never  Vaping Use   Vaping status: Never Used  Substance Use Topics   Alcohol  use: Yes    Comment: occas   Drug use: Never    Family History  Problem Relation Age of Onset   Breast cancer Paternal Aunt    Diabetes Father    Colon cancer Neg Hx    Colon polyps Neg Hx     Allergies  Allergen Reactions   No [employer] Food Allergies Itching and Rash    All fruits including tomatoes    Health Maintenance  Topic Date Due   Medicare Annual Wellness (AWV)  Never done   FOOT EXAM  Never done   OPHTHALMOLOGY EXAM  Never done   Diabetic kidney evaluation - Urine ACR  Never done   Hepatitis C Screening  Never done   DTaP/Tdap/Td (1 - Tdap) Never done   Pneumococcal Vaccine 38-79 Years old (1 of 2 - PCV) Never done   Cervical Cancer Screening (HPV/Pap Cotest)  Never done   Zoster Vaccines- Shingrix (1 of 2) Never done    COVID-19 Vaccine (1 - 2024-25 season) Never done   HEMOGLOBIN A1C  03/25/2024   INFLUENZA VACCINE  03/28/2024   Diabetic kidney evaluation - eGFR measurement  09/26/2024   MAMMOGRAM  11/23/2024   Colonoscopy  10/05/2031   HIV Screening  Completed   HPV VACCINES  Aged Out   Meningococcal B Vaccine  Aged Out    Objective:  There were no vitals filed for this visit. There is no height or weight on file to calculate BMI.  Physical Exam Constitutional:      Appearance: Normal appearance.  HENT:     Head: Normocephalic and atraumatic.     Right Ear: Tympanic membrane normal.     Left Ear: Tympanic membrane normal.     Nose:  Nose normal.     Mouth/Throat:     Mouth: Mucous membranes are moist.  Eyes:     Extraocular Movements: Extraocular movements intact.     Conjunctiva/sclera: Conjunctivae normal.     Pupils: Pupils are equal, round, and reactive to light.  Cardiovascular:     Rate and Rhythm: Normal rate and regular rhythm.     Heart sounds: No murmur heard.    No friction rub. No gallop.  Pulmonary:     Effort: Pulmonary effort is normal.     Breath sounds: Normal breath sounds.  Abdominal:     General: Abdomen is flat.     Palpations: Abdomen is soft.  Musculoskeletal:        General: Normal range of motion.  Skin:    General: Skin is warm and dry.  Neurological:     General: No focal deficit present.     Mental Status: She is alert and oriented to person, place, and time.  Psychiatric:        Mood and Affect: Mood normal.    Physical Exam   Lab Results Lab Results  Component Value Date   WBC 6.2 09/27/2023   HGB 11.6 (L) 09/27/2023   HCT 36.5 09/27/2023   MCV 81.7 09/27/2023   PLT 214 09/27/2023    Lab Results  Component Value Date   CREATININE 1.11 (H) 09/27/2023   BUN 23 09/27/2023   NA 141 09/27/2023   K 3.4 (L) 09/27/2023   CL 108 09/27/2023   CO2 22 09/27/2023    Lab Results  Component Value Date   ALT 32 09/26/2023   AST 22  09/26/2023   ALKPHOS 64 09/26/2023   BILITOT 0.5 09/26/2023    No results found for: "CHOL", "HDL", "LDLCALC", "LDLDIRECT", "TRIG", "CHOLHDL" No results found for: "LABRPR", "RPRTITER" No results found for: "HIV1RNAQUANT", "HIV1RNAVL", "CD4TABS"   Problem List Items Addressed This Visit   None  Results   Assessment/Plan #Possible osteomyelitis with Hx of foot ulcer -Pt states she initially had an ulcer on right great toe.  She was hospitalized back in January and treated with 2 weeks of outpatient antibiotics.  PCP did repeat MRI which showed persistent distal phalanx osteomyelitis.  Started on Doxy cyclin, Augmentin  500 twice daily on 4/7. -Of note imaging findings on MRI can lag.  Will treat with full 6-week course of antibiotics given diabetic history.  Hold off on biopsy as patient has already been on 2 weeks antibiotics.  Foot ulcer closed.  She is currently followed by podiatry but would like to see someone in the area. - Continue antibiotics as per diabetes mellitus with foot ulcer plan. - Re-evaluate on Jan 14, 2024. -Doxy and augmetin o compelte 6 weeks rc started on 4/7-5/19 - Labs today - Refer to podiatry #Poorly controlled DM -A1c 9.5 on 12/13/23    Orlie Bjornstad, MD [facility] 12/18/2023, 2:23 PM   I have personally spent 65 minutes involved in face-to-face and non-face-to-face activities for this patient on the day of the visit. Professional time spent includes the following activities: Preparing to see the patient (review of tests), Obtaining and/or reviewing separately obtained history (admission/discharge record), Performing a medically appropriate examination and/or evaluation , Ordering medications/tests/procedures, referring and communicating with other health care professionals, Documenting clinical information in the EMR, Independently interpreting results (not separately reported), Communicating results to  the patient/family/caregiver, Counseling and educating the patient/family/caregiver and Care coordination (not separately reported).

## 2023-12-18 NOTE — Patient Instructions (Signed)
 Continue antibiotics till 5/19 Augmentin  875-125 bid and doxycyline 100mg  bid

## 2023-12-19 LAB — C-REACTIVE PROTEIN: CRP: 57.6 mg/L — ABNORMAL HIGH (ref <8.0)

## 2023-12-19 LAB — SEDIMENTATION RATE: Sed Rate: 70 mm/h — ABNORMAL HIGH (ref 0–30)

## 2023-12-25 ENCOUNTER — Ambulatory Visit: Admitting: Podiatry

## 2024-01-04 ENCOUNTER — Other Ambulatory Visit: Payer: Self-pay

## 2024-01-04 ENCOUNTER — Encounter (HOSPITAL_COMMUNITY): Payer: Self-pay | Admitting: *Deleted

## 2024-01-04 ENCOUNTER — Emergency Department (HOSPITAL_COMMUNITY)

## 2024-01-04 ENCOUNTER — Emergency Department (HOSPITAL_COMMUNITY)
Admission: EM | Admit: 2024-01-04 | Discharge: 2024-01-04 | Disposition: A | Discharge status: Home / Self Care | Attending: Emergency Medicine | Admitting: Emergency Medicine

## 2024-01-04 DIAGNOSIS — Z7984 Long term (current) use of oral hypoglycemic drugs: Secondary | ICD-10-CM | POA: Insufficient documentation

## 2024-01-04 DIAGNOSIS — E1142 Type 2 diabetes mellitus with diabetic polyneuropathy: Secondary | ICD-10-CM | POA: Insufficient documentation

## 2024-01-04 DIAGNOSIS — I10 Essential (primary) hypertension: Secondary | ICD-10-CM | POA: Diagnosis not present

## 2024-01-04 DIAGNOSIS — Z794 Long term (current) use of insulin: Secondary | ICD-10-CM | POA: Insufficient documentation

## 2024-01-04 DIAGNOSIS — Z87891 Personal history of nicotine dependence: Secondary | ICD-10-CM | POA: Diagnosis not present

## 2024-01-04 DIAGNOSIS — R112 Nausea with vomiting, unspecified: Secondary | ICD-10-CM | POA: Diagnosis present

## 2024-01-04 DIAGNOSIS — Z79899 Other long term (current) drug therapy: Secondary | ICD-10-CM | POA: Insufficient documentation

## 2024-01-04 DIAGNOSIS — Z7982 Long term (current) use of aspirin: Secondary | ICD-10-CM | POA: Insufficient documentation

## 2024-01-04 DIAGNOSIS — D649 Anemia, unspecified: Secondary | ICD-10-CM

## 2024-01-04 DIAGNOSIS — R1013 Epigastric pain: Secondary | ICD-10-CM | POA: Insufficient documentation

## 2024-01-04 LAB — COMPREHENSIVE METABOLIC PANEL WITH GFR
ALT: 60 U/L — ABNORMAL HIGH (ref 0–44)
AST: 30 U/L (ref 15–41)
Albumin: 2.4 g/dL — ABNORMAL LOW (ref 3.5–5.0)
Alkaline Phosphatase: 222 U/L — ABNORMAL HIGH (ref 38–126)
Anion gap: 12 (ref 5–15)
BUN: 23 mg/dL (ref 8–23)
CO2: 24 mmol/L (ref 22–32)
Calcium: 9.1 mg/dL (ref 8.9–10.3)
Chloride: 100 mmol/L (ref 98–111)
Creatinine, Ser: 1.45 mg/dL — ABNORMAL HIGH (ref 0.44–1.00)
GFR, Estimated: 40 mL/min — ABNORMAL LOW (ref >60)
Glucose, Bld: 434 mg/dL — ABNORMAL HIGH (ref 70–99)
Potassium: 4 mmol/L (ref 3.5–5.1)
Sodium: 136 mmol/L (ref 135–145)
Total Bilirubin: 0.9 mg/dL (ref 0.0–1.2)
Total Protein: 6.7 g/dL (ref 6.5–8.1)

## 2024-01-04 LAB — URINALYSIS, ROUTINE W REFLEX MICROSCOPIC
Bilirubin Urine: NEGATIVE
Glucose, UA: >=500 mg/dL — AB
Ketones, ur: NEGATIVE mg/dL
Leukocytes,Ua: NEGATIVE
Nitrite: NEGATIVE
Protein, ur: >=300 mg/dL — AB
Specific Gravity, Urine: 1.014 (ref 1.005–1.030)
pH: 6 (ref 5.0–8.0)

## 2024-01-04 LAB — CBC WITH DIFFERENTIAL/PLATELET
Abs Immature Granulocytes: 0.06 10*3/uL (ref 0.00–0.07)
Basophils Absolute: 0 10*3/uL (ref 0.0–0.1)
Basophils Relative: 1 %
Eosinophils Absolute: 0.1 10*3/uL (ref 0.0–0.5)
Eosinophils Relative: 1 %
HCT: 28 % — ABNORMAL LOW (ref 36.0–46.0)
Hemoglobin: 8.9 g/dL — ABNORMAL LOW (ref 12.0–15.0)
Immature Granulocytes: 1 %
Lymphocytes Relative: 10 %
Lymphs Abs: 0.9 10*3/uL (ref 0.7–4.0)
MCH: 25.9 pg — ABNORMAL LOW (ref 26.0–34.0)
MCHC: 31.8 g/dL (ref 30.0–36.0)
MCV: 81.6 fL (ref 80.0–100.0)
Monocytes Absolute: 1 10*3/uL (ref 0.1–1.0)
Monocytes Relative: 12 %
Neutro Abs: 6.4 10*3/uL (ref 1.7–7.7)
Neutrophils Relative %: 75 %
Platelets: 345 10*3/uL (ref 150–400)
RBC: 3.43 MIL/uL — ABNORMAL LOW (ref 3.87–5.11)
RDW: 13.7 % (ref 11.5–15.5)
WBC: 8.5 10*3/uL (ref 4.0–10.5)
nRBC: 0 % (ref 0.0–0.2)

## 2024-01-04 LAB — VITAMIN B12: Vitamin B-12: 773 pg/mL (ref 180–914)

## 2024-01-04 LAB — RAPID URINE DRUG SCREEN, HOSP PERFORMED
Amphetamines: NOT DETECTED
Barbiturates: NOT DETECTED
Benzodiazepines: NOT DETECTED
Cocaine: NOT DETECTED
Opiates: NOT DETECTED
Tetrahydrocannabinol: NOT DETECTED

## 2024-01-04 LAB — RETICULOCYTES
Immature Retic Fract: 19.2 % — ABNORMAL HIGH (ref 2.3–15.9)
RBC.: 3.41 MIL/uL — ABNORMAL LOW (ref 3.87–5.11)
Retic Count, Absolute: 48.8 10*3/uL (ref 19.0–186.0)
Retic Ct Pct: 1.4 % (ref 0.4–3.1)

## 2024-01-04 LAB — FOLATE: Folate: 7.9 ng/mL (ref >5.9)

## 2024-01-04 LAB — FERRITIN: Ferritin: 524 ng/mL — ABNORMAL HIGH (ref 11–307)

## 2024-01-04 LAB — IRON AND TIBC
Iron: 13 ug/dL — ABNORMAL LOW (ref 28–170)
Saturation Ratios: 6 % — ABNORMAL LOW (ref 10.4–31.8)
TIBC: 204 ug/dL — ABNORMAL LOW (ref 250–450)
UIBC: 191 ug/dL

## 2024-01-04 LAB — CBG MONITORING, ED
Glucose-Capillary: 357 mg/dL — ABNORMAL HIGH (ref 70–99)
Glucose-Capillary: 359 mg/dL — ABNORMAL HIGH (ref 70–99)

## 2024-01-04 LAB — LIPASE, BLOOD: Lipase: 51 U/L (ref 11–51)

## 2024-01-04 MED ORDER — ALUM & MAG HYDROXIDE-SIMETH 200-200-20 MG/5ML PO SUSP
15.0000 mL | Freq: Once | ORAL | Status: AC
  Administered 2024-01-04: 15 mL via ORAL
  Filled 2024-01-04: qty 30

## 2024-01-04 MED ORDER — IOHEXOL 300 MG/ML  SOLN
100.0000 mL | Freq: Once | INTRAMUSCULAR | Status: AC | PRN
  Administered 2024-01-04: 100 mL via INTRAVENOUS

## 2024-01-04 MED ORDER — INSULIN ASPART 100 UNIT/ML IJ SOLN
8.0000 [IU] | Freq: Once | INTRAMUSCULAR | Status: AC
  Administered 2024-01-04: 8 [IU] via SUBCUTANEOUS
  Filled 2024-01-04: qty 1

## 2024-01-04 MED ORDER — SODIUM CHLORIDE 0.9 % IV BOLUS
1000.0000 mL | Freq: Once | INTRAVENOUS | Status: AC
  Administered 2024-01-04: 1000 mL via INTRAVENOUS

## 2024-01-04 MED ORDER — ONDANSETRON 4 MG PO TBDP
4.0000 mg | ORAL_TABLET | Freq: Once | ORAL | Status: AC
  Administered 2024-01-04: 4 mg via ORAL
  Filled 2024-01-04: qty 1

## 2024-01-04 MED ORDER — PROCHLORPERAZINE EDISYLATE 10 MG/2ML IJ SOLN
5.0000 mg | Freq: Once | INTRAMUSCULAR | Status: AC
  Administered 2024-01-04: 5 mg via INTRAVENOUS
  Filled 2024-01-04: qty 2

## 2024-01-04 MED ORDER — DIPHENHYDRAMINE HCL 50 MG/ML IJ SOLN
25.0000 mg | Freq: Once | INTRAMUSCULAR | Status: AC
  Administered 2024-01-04: 25 mg via INTRAVENOUS
  Filled 2024-01-04: qty 1

## 2024-01-04 MED ORDER — METOCLOPRAMIDE HCL 10 MG PO TABS
10.0000 mg | ORAL_TABLET | Freq: Four times a day (QID) | ORAL | 0 refills | Status: DC | PRN
Start: 2024-01-04 — End: 2024-02-04

## 2024-01-04 NOTE — Discharge Instructions (Signed)
 We evaluated you for your nausea and vomiting.  Your symptoms improved in the emergency department.  Your testing did not show any dangerous cause of your symptoms.  Your symptoms may be due to your uncontrolled blood glucose.  Please be sure to keep your blood glucose under control and take your insulin  as prescribed.  We also noticed that your red blood cell count was low.  Please take your iron pills as recommended by your primary doctor.  Please follow-up closely with your primary doctor.  Return to the emergency department for any worsening symptoms.

## 2024-01-04 NOTE — ED Notes (Signed)
 Patient tolerated 6 oz of water  and crackers with peanut butter. No vomiting noted.

## 2024-01-04 NOTE — ED Triage Notes (Signed)
 Pt c/o vomiting x 3 days. Denies diarrhea and abdominal pain.

## 2024-01-04 NOTE — ED Provider Notes (Signed)
 [place] EMERGENCY DEPARTMENT AT [facility] Provider Note  CSN: [id number] Arrival date & time: 01/04/24 4098  Chief Complaint(s) Emesis  HPI Nicole Golden is a 63 y.o. female with past medical history as below, significant for DM, hypertension, pancreatitis, who presents to the ED with complaint of nausea vomiting epigastric pain  ongoing around 3 days, nausea and vomiting after p.o. intake. No unusual foods, no diarrhea or urination change. No fever or travel.  No fevers, chills, rashes or travel.  Pain provoked by p.o. intake regardless of the p.o. intake is.  Emesis nonbloody nonbilious.  No daily alcohol  or daily THC use.  Past Medical History Past Medical History:  Diagnosis Date   Diabetes mellitus without complication (HCC)    Diabetic neuropathy (HCC)    Diabetic neuropathy (HCC)    Hypertension    Hypertensive retinopathy    OU   Patient Active Problem List   Diagnosis Date Noted   Dyslipidemia 09/27/2023   Type 2 diabetes mellitus with peripheral neuropathy (HCC) 09/27/2023   Toe osteomyelitis, right (HCC) 09/26/2023   Encounter for screening colonoscopy 02/15/2022   Gangrene of toe of right foot (HCC)    Sepsis due to Rt Foot cellulitis/Abscess/OsteoMyelitis 03/01/2021   Cellulitis and Abscess and Osteomyelitis in diabetic foot ---Rt 03/01/2021   Gallstones 03/01/2021   Nausea & vomiting 02/28/2021   Essential hypertension 02/28/2021   Idiopathic acute pancreatitis without infection or necrosis    Constipation 05/08/2018   DKA (diabetic ketoacidosis) (HCC) 04/24/2016   Abdominal wall cellulitis 04/24/2016   Type II diabetes mellitus with complication, uncontrolled 04/24/2016   Home Medication(s) Prior to Admission medications   Medication Sig Start Date End Date Taking? Authorizing Provider  amLODipine  (NORVASC ) 10 MG tablet Take 10 mg by mouth in the morning.    [provider]  amoxicillin -clavulanate (AUGMENTIN ) 875-125 MG tablet Take  1 tablet by mouth 2 (two) times daily. 12/18/23   Orlie Bjornstad, MD  aspirin  EC 81 MG tablet Take 81 mg by mouth daily. Swallow whole.    [provider]  atorvastatin (LIPITOR) 80 MG tablet Take 80 mg by mouth. 12/14/23 01/13/24  [provider]  carvedilol (COREG) 6.25 MG tablet Take 6.25 mg by mouth. 12/13/23 01/12/24  [provider]  Cholecalciferol  (VITAMIN D3) 50 MCG (2000 UT) TABS Take 2,000 Units by mouth in the morning.    [provider]  Cyanocobalamin  (VITAMIN B-12 PO) Take 1 tablet by mouth in the morning. Gummy    [provider]  diltiazem (CARDIZEM CD) 180 MG 24 hr capsule Take 180 mg by mouth. 12/13/23 01/16/25  [provider]  dorzolamide -timolol  (COSOPT ) 22.3-6.8 MG/ML ophthalmic solution Place 1 drop into both eyes in the morning. Patient not taking: Reported on 12/18/2023 08/06/20   [provider]  doxycycline  (VIBRAMYCIN ) 100 MG capsule Take 1 capsule (100 mg total) by mouth 2 (two) times daily. 12/18/23   Orlie Bjornstad, MD  LANTUS  SOLOSTAR 100 UNIT/ML Solostar Pen Inject 40 Units into the skin 2 (two) times daily. Patient taking differently: Inject 60 Units into the skin 2 (two) times daily. 03/08/21   Gwendalyn Lemma, MD  losartan  (COZAAR ) 100 MG tablet Take 100 mg by mouth in the morning. 06/23/21   [provider]  MOUNJARO 2.5 MG/0.5ML Pen Inject 2.5 mg into the skin every Sunday. 08/16/21   [provider]  mupirocin  cream (BACTROBAN ) 2 % Apply 1 Application topically 2 (two) times daily. Patient not taking: Reported on 12/18/2023  05/03/23   Early Glisson, MD  NOVOLOG  FLEXPEN 100 UNIT/ML FlexPen Inject 15 Units into the skin 3 (three) times daily before meals. 03/04/20   [provider]  rosuvastatin  (CRESTOR ) 40 MG tablet Take 40 mg by mouth in the morning. Patient not taking: Reported on 12/18/2023    [provider]                                                                                                                                     Past Surgical History Past Surgical History:  Procedure Laterality Date   AMPUTATION TOE Right 03/03/2021   Procedure: AMPUTATION TOE;  Surgeon: Alanda Allegra, MD;  Location: [facility] ORS;  Service: General;  Laterality: Right;   CATARACT EXTRACTION Bilateral    COLONOSCOPY WITH PROPOFOL  N/A 10/04/2021   Procedure: COLONOSCOPY WITH PROPOFOL ;  Surgeon: Vinetta Greening, DO;  Location: [facility] END[facility] SUITE;  Service: Endoscopy;  Laterality: N/A;  11:00 / ASA 2   EYE SURGERY Bilateral    Cat Sx   FOOT SURGERY     right foot   TUBAL LIGATION     Family History Family History  Problem Relation Age of Onset   Breast cancer Paternal Aunt    Diabetes Father    Colon cancer Neg Hx    Colon polyps Neg Hx     Social History Social History   Tobacco Use   Smoking status: Former   Smokeless tobacco: Never  Advertising account planner   Vaping status: Never Used  Substance Use Topics   Alcohol  use: Not Currently   Drug use: Never   Allergies No healthtouch food allergies  Review of Systems A thorough review of systems was obtained and all systems are negative except as noted in the HPI and PMH.   Physical Exam Vital Signs  I have reviewed the triage vital signs BP (!) 175/73 (BP Location: Right Arm)   Pulse (!) 103   Temp 98.5 F (36.9 C) (Oral)   Resp 18   Ht 5\' 7"  (1.702 m)   Wt 80.3 kg   SpO2 95%   BMI 27.72 kg/m  Physical Exam Vitals and nursing note reviewed.  Constitutional:      General: She is not in acute distress.    Appearance: Normal appearance.  HENT:     Head: Normocephalic and atraumatic.     Right Ear: External ear normal.     Left Ear: External ear normal.     Nose: Nose normal.     Mouth/Throat:     Mouth: Mucous membranes are dry.  Eyes:     General: No scleral icterus.       Right eye: No discharge.        Left eye: No discharge.  Cardiovascular:     Rate and Rhythm: Regular rhythm. Tachycardia present.      Pulses: Normal pulses.     Heart sounds: Normal  heart sounds.  Pulmonary:     Effort: Pulmonary effort is normal. No respiratory distress.     Breath sounds: Normal breath sounds. No stridor.  Abdominal:     General: Abdomen is flat. There is no distension.     Palpations: Abdomen is soft.     Tenderness: There is abdominal tenderness.    Musculoskeletal:     Cervical back: No rigidity.     Right lower leg: No edema.     Left lower leg: No edema.  Skin:    General: Skin is warm and dry.     Capillary Refill: Capillary refill takes less than 2 seconds.  Neurological:     Mental Status: She is alert.  Psychiatric:        Mood and Affect: Mood normal.        Behavior: Behavior normal. Behavior is cooperative.     ED Results and Treatments Labs (all labs ordered are listed, but only abnormal results are displayed) Labs Reviewed  COMPREHENSIVE METABOLIC PANEL WITH GFR - Abnormal; Notable for the following components:      Result Value   Glucose, Bld 434 (*)    Creatinine, Ser 1.45 (*)    Albumin 2.4 (*)    ALT 60 (*)    Alkaline Phosphatase 222 (*)    GFR, Estimated 40 (*)    All other components within normal limits  URINALYSIS, ROUTINE W REFLEX MICROSCOPIC - Abnormal; Notable for the following components:   APPearance HAZY (*)    Glucose, UA >=500 (*)    Hgb urine dipstick SMALL (*)    Protein, ur >=300 (*)    Bacteria, UA RARE (*)    All other components within normal limits  CBC WITH DIFFERENTIAL/PLATELET - Abnormal; Notable for the following components:   RBC 3.43 (*)    Hemoglobin 8.9 (*)    HCT 28.0 (*)    MCH 25.9 (*)    All other components within normal limits  LIPASE, BLOOD  RAPID URINE DRUG SCREEN, HOSP PERFORMED  VITAMIN B12  FOLATE  IRON AND TIBC  FERRITIN  RETICULOCYTES  CBG MONITORING, ED                                                                                                                          Radiology US  Abdomen Limited RUQ  (LIVER/GB) Result Date: 01/04/2024 CLINICAL DATA:  Right upper quadrant pain EXAM: ULTRASOUND ABDOMEN LIMITED RIGHT UPPER QUADRANT COMPARISON:  CT 01/04/2024 FINDINGS: Gallbladder: Gallbladder is mildly distended. No wall thickening or adjacent fluid. Few dependent stones. No reported sonographic Murphy's sign. Common bile duct: Diameter: 4 mm Liver: No focal lesion identified. Within normal limits in parenchymal echogenicity. Portal vein is patent on color Doppler imaging with normal direction of blood flow towards the liver. Other: Right-sided pleural effusion identified. Mild atrophy of the right kidney. IMPRESSION: Gallstones. No further sonographic evidence of acute cholecystitis or biliary ductal dilatation. Right-sided pleural effusion. Electronically Signed  By: Adrianna Horde M.D.   On: 01/04/2024 13:24   CT ABDOMEN PELVIS W CONTRAST Result Date: 01/04/2024 CLINICAL DATA:  Epigastric pain n/v epig pain x3 days. EXAM: CT ABDOMEN AND PELVIS WITH CONTRAST TECHNIQUE: Multidetector CT imaging of the abdomen and pelvis was performed using the standard protocol following bolus administration of intravenous contrast. RADIATION DOSE REDUCTION: This exam was performed according to the departmental dose-optimization program which includes automated exposure control, adjustment of the mA and/or kV according to patient size and/or use of iterative reconstruction technique. CONTRAST:  100mL OMNIPAQUE IOHEXOL 300 MG/ML  SOLN COMPARISON:  CT scan abdomen and pelvis from 02/28/2021. FINDINGS: Lower chest: There are bilateral small pleural effusions with associated compressive atelectatic changes. Bilateral lungs are otherwise clear. Normal heart size. Small to moderate pericardial effusion. Hepatobiliary: The liver is normal in size. Non-cirrhotic configuration. No suspicious mass. No intrahepatic or extrahepatic bile duct dilation. Small volume layering sub 5 mm calcified gallstones noted without imaging signs of acute  cholecystitis. Normal gallbladder wall thickness. No pericholecystic inflammatory changes. Pancreas: Unremarkable. No pancreatic ductal dilatation or surrounding inflammatory changes. Spleen: Within normal limits. No focal lesion. Adrenals/Urinary Tract: There is subtle nodularity of bilateral adrenal glands however, essentially unchanged since the prior study from July 2022 and favored benign. No suspicious renal mass. There is a 1.1 x 1.2 cm simple cyst arising from the left kidney interpolar region, anteriorly. No nephroureterolithiasis or obstructive uropathy. Unremarkable urinary bladder. Stomach/Bowel: No disproportionate dilation of the small or large bowel loops. No evidence of abnormal bowel wall thickening or inflammatory changes. The appendix is unremarkable. There are scattered diverticula throughout the colon, without imaging signs of diverticulitis. Vascular/Lymphatic: No ascites or pneumoperitoneum. No abdominal or pelvic lymphadenopathy, by size criteria. No aneurysmal dilation of the major abdominal arteries. There are mild peripheral atherosclerotic vascular calcifications of the aorta and its major branches. Reproductive: Not well evaluated on the CT scan exam. However, note is made of bulky anteverted uterus due to multiple calcified and noncalcified leiomyomas with largest calcified leiomyoma arising from the anterior uterine body measuring up to 4.4 x 4.4 cm. No large adnexal mass seen. Other: Mild-to-moderate anasarca. The soft tissues and abdominal wall are otherwise unremarkable. Musculoskeletal: No suspicious osseous lesions. There are mild multilevel degenerative changes in the visualized spine. IMPRESSION: 1. No acute inflammatory process identified within the abdomen or pelvis. 2. There are bilateral small pleural effusions and small to moderate pericardial effusion. 3. Multiple other nonacute observations, as described above. Aortic Atherosclerosis (ICD10-I70.0). Electronically Signed    By: Beula Brunswick M.D.   On: 01/04/2024 12:16    Pertinent labs & imaging results that were available during my care of the patient were reviewed by me and considered in my medical decision making (see MDM for details).  Medications Ordered in ED Medications  alum & mag hydroxide-simeth (MAALOX/MYLANTA) 200-200-20 MG/5ML suspension 15 mL (has no administration in time range)  prochlorperazine (COMPAZINE) injection 5 mg (has no administration in time range)  diphenhydrAMINE  (BENADRYL ) injection 25 mg (has no administration in time range)  ondansetron  (ZOFRAN -ODT) disintegrating tablet 4 mg (4 mg Oral Given 01/04/24 1116)  sodium chloride  0.9 % bolus 1,000 mL (0 mLs Intravenous Stopped 01/04/24 1450)  iohexol (OMNIPAQUE) 300 MG/ML solution 100 mL (100 mLs Intravenous Contrast Given 01/04/24 1154)  Procedures Procedures  (including critical care time)  Medical Decision Making / ED Course    Medical Decision Making:    LAKIE ARNOLD is a 63 y.o. female with past medical history as below, significant for DM, hypertension, pancreatitis, who presents to the ED with complaint of nausea vomiting epigastric pain. The complaint involves an extensive differential diagnosis and also carries with it a high risk of complications and morbidity.  Serious etiology was considered. Ddx includes but is not limited to: Differential diagnosis includes but is not exclusive to acute cholecystitis, intrathoracic causes for epigastric abdominal pain, gastritis, duodenitis, pancreatitis, small bowel or large bowel obstruction, abdominal aortic aneurysm, hernia, gastritis, etc.   Complete initial physical exam performed, notably the patient was in no distress, abdomen nonperitoneal.    Reviewed and confirmed nursing documentation for past medical history, family history, social history.   Vital signs reviewed.     Brief summary:  63 year old female history above here with epigastric pain, nausea vomiting for 3 days. Epigastric TTP Will check screening labs, give fluids, antiemetic, get CT imaging abdomen pelvis   Clinical Course as of 01/04/24 1530  Fri Jan 04, 2024  1139 Creatinine(!): 1.45 Worsened from prior [SG]  1139 Alkaline Phosphatase(!): 222 Elev from prior [SG]  1519 Was no able to pass po challenge, reports vomiting after eating saltine crackers. She has no abd pain, no epig pain. Will give rpt anti-emetic and maalox, recheck [SG]  1520 Hemoglobin(!): 8.9 Denies bleeding, no brbpr or melena, no rashes. Reports was previously on iron supplements but switched to eating high iron containing foods instead.  [SG]  1528 Received sign out from Dr. Martina Sledge pending  [WS]    Clinical Course User Index [SG] Teddi Favors, DO [WS] Mordecai Applebaum, MD     Ultrasound revealing for gallstones, no evidence of cholecystitis. History of small pleural effusions and small pericardial effusion.  She has no chest pain or dyspnea. Hgb is reduced from prior. Was 11.6 three months ago. She denies brbpr or melena. Denies rashes or unusual bleeding. Will restart iron supplement, have her follow up with pcp for recheck of hgb  She did have repeat episode of emesis after po challenge. Give further anti-emetic and recheck  Concern ?gastroparesis.   Handoff incoming edp for rpt po chall             Additional history obtained: -Additional history obtained from na -External records from outside source obtained and reviewed including: Chart review including previous notes, labs, imaging, consultation notes including  Recent admission, prior labs and imaging, home medications   Lab Tests: -I ordered, reviewed, and interpreted labs.   The pertinent results include:   Labs Reviewed  COMPREHENSIVE METABOLIC PANEL WITH GFR - Abnormal; Notable for the following  components:      Result Value   Glucose, Bld 434 (*)    Creatinine, Ser 1.45 (*)    Albumin 2.4 (*)    ALT 60 (*)    Alkaline Phosphatase 222 (*)    GFR, Estimated 40 (*)    All other components within normal limits  URINALYSIS, ROUTINE W REFLEX MICROSCOPIC - Abnormal; Notable for the following components:   APPearance HAZY (*)    Glucose, UA >=500 (*)    Hgb urine dipstick SMALL (*)    Protein, ur >=300 (*)    Bacteria, UA RARE (*)    All other components within normal limits  CBC WITH DIFFERENTIAL/PLATELET - Abnormal; Notable for the following components:  RBC 3.43 (*)    Hemoglobin 8.9 (*)    HCT 28.0 (*)    MCH 25.9 (*)    All other components within normal limits  LIPASE, BLOOD  RAPID URINE DRUG SCREEN, HOSP PERFORMED  VITAMIN B12  FOLATE  IRON AND TIBC  FERRITIN  RETICULOCYTES  CBG MONITORING, ED    Notable for as above  EKG   EKG Interpretation Date/Time:  Friday Jan 04 2024 09:51:04 EDT Ventricular Rate:  101 PR Interval:  132 QRS Duration:  86 QT Interval:  316 QTC Calculation: 409 R Axis:   49  Text Interpretation: Sinus tachycardia with Premature atrial complexes Otherwise normal ECG When compared with ECG of 03-Mar-2021 18:05, Premature atrial complexes are now Present Confirmed by Russella Courts ([id number]) on 01/04/2024 10:35:32 AM         Imaging Studies ordered: I ordered imaging studies including CTAP ruq us  I independently visualized the following imaging with scope of interpretation limited to determining acute life threatening conditions related to emergency care; findings noted above I agree with the radiologist interpretation If any imaging was obtained with contrast I closely monitored patient for any possible adverse reaction a/w contrast administration in the emergency department   Medicines ordered and prescription drug management: Meds ordered this encounter  Medications   ondansetron  (ZOFRAN -ODT) disintegrating tablet 4 mg   sodium  chloride 0.9 % bolus 1,000 mL   iohexol (OMNIPAQUE) 300 MG/ML solution 100 mL   alum & mag hydroxide-simeth (MAALOX/MYLANTA) 200-200-20 MG/5ML suspension 15 mL   prochlorperazine (COMPAZINE) injection 5 mg   diphenhydrAMINE  (BENADRYL ) injection 25 mg    -I have reviewed the patients home medicines and have made adjustments as needed   Consultations Obtained: na   Cardiac Monitoring: Continuous pulse oximetry interpreted by myself, 97% on RA.    Social Determinants of Health:  Diagnosis or treatment significantly limited by social determinants of health: former smoker   Reevaluation: After the interventions noted above, I reevaluated the patient and found that they have stayed the same  Co morbidities that complicate the patient evaluation  Past Medical History:  Diagnosis Date   Diabetes mellitus without complication (HCC)    Diabetic neuropathy (HCC)    Diabetic neuropathy (HCC)    Hypertension    Hypertensive retinopathy    OU      Dispostion: Disposition decision including need for hospitalization was considered, and patient disposition pending at time of sign out.    Final Clinical Impression(s) / ED Diagnoses Final diagnoses:  None        Teddi Favors, DO 01/04/24 1530

## 2024-01-04 NOTE — ED Provider Notes (Signed)
   ED Course / MDM   Clinical Course as of 01/04/24 1822  Fri Jan 04, 2024  1139 Creatinine(!): 1.45 Worsened from prior [SG]  1139 Alkaline Phosphatase(!): 222 Elev from prior [SG]  1519 Was no able to pass po challenge, reports vomiting after eating saltine crackers. She has no abd pain, no epig pain. Will give rpt anti-emetic and maalox, recheck [SG]  1520 Hemoglobin(!): 8.9 Denies bleeding, no brbpr or melena, no rashes. Reports was previously on iron supplements but switched to eating high iron containing foods instead.  [SG]  1528 Received sign out from Dr. Martina Sledge pending  [WS]  1529 Received sign out from Dr. Martina Sledge pending additional medications and PO challenge. Pt with vomiting and nausea without abdominal pain or other symptoms. Incidentally noted to have worsening anemia without symptoms of GI bleeding. Pt supposed to be on iron supplementation but not taking currently. Will re-assess [WS]  1821 Patient reports that she feels better, is tolerating p.o.  Blood glucose improved.  Differential includes gastroparesis, gastritis.  Will prescribe Reglan .  Also emphasized need to take her iron supplementation as prescribed and follow-up closely with her primary doctor for lab recheck. Will discharge patient to home. All questions answered. Patient comfortable with plan of discharge. Return precautions discussed with patient and specified on the after visit summary.  [WS]    Clinical Course User Index [SG] Teddi Favors, DO [WS] Mordecai Applebaum, MD   Medical Decision Making Amount and/or Complexity of Data Reviewed Labs: ordered. Decision-making details documented in ED Course. Radiology: ordered.  Risk OTC drugs. Prescription drug management.         Mordecai Applebaum, MD 01/04/24 Jerre Moots

## 2024-01-07 ENCOUNTER — Encounter (HOSPITAL_BASED_OUTPATIENT_CLINIC_OR_DEPARTMENT_OTHER): Payer: Self-pay | Admitting: Internal Medicine

## 2024-01-07 ENCOUNTER — Encounter (HOSPITAL_BASED_OUTPATIENT_CLINIC_OR_DEPARTMENT_OTHER): Attending: General Surgery | Admitting: Internal Medicine

## 2024-01-07 DIAGNOSIS — M86671 Other chronic osteomyelitis, right ankle and foot: Secondary | ICD-10-CM | POA: Insufficient documentation

## 2024-01-07 DIAGNOSIS — L97518 Non-pressure chronic ulcer of other part of right foot with other specified severity: Secondary | ICD-10-CM | POA: Diagnosis not present

## 2024-01-07 DIAGNOSIS — E1151 Type 2 diabetes mellitus with diabetic peripheral angiopathy without gangrene: Secondary | ICD-10-CM | POA: Insufficient documentation

## 2024-01-07 DIAGNOSIS — E11621 Type 2 diabetes mellitus with foot ulcer: Secondary | ICD-10-CM | POA: Insufficient documentation

## 2024-01-14 ENCOUNTER — Telehealth: Payer: Self-pay

## 2024-01-14 ENCOUNTER — Ambulatory Visit: Admitting: Internal Medicine

## 2024-01-14 NOTE — Telephone Encounter (Signed)
 Attempted to call patient and reschedule no show 01/14/24 no answer.

## 2024-01-20 ENCOUNTER — Other Ambulatory Visit: Payer: Self-pay

## 2024-01-20 ENCOUNTER — Emergency Department (HOSPITAL_COMMUNITY)
Admission: EM | Admit: 2024-01-20 | Discharge: 2024-01-20 | Disposition: A | Discharge status: Home / Self Care | Attending: Student | Admitting: Student

## 2024-01-20 DIAGNOSIS — Z7982 Long term (current) use of aspirin: Secondary | ICD-10-CM | POA: Diagnosis not present

## 2024-01-20 DIAGNOSIS — R739 Hyperglycemia, unspecified: Secondary | ICD-10-CM

## 2024-01-20 DIAGNOSIS — R112 Nausea with vomiting, unspecified: Secondary | ICD-10-CM | POA: Diagnosis present

## 2024-01-20 DIAGNOSIS — E1165 Type 2 diabetes mellitus with hyperglycemia: Secondary | ICD-10-CM | POA: Insufficient documentation

## 2024-01-20 LAB — URINALYSIS, W/ REFLEX TO CULTURE (INFECTION SUSPECTED)
Bilirubin Urine: NEGATIVE
Glucose, UA: >=500 mg/dL — AB
Ketones, ur: NEGATIVE mg/dL
Leukocytes,Ua: NEGATIVE
Nitrite: NEGATIVE
Protein, ur: >=300 mg/dL — AB
Specific Gravity, Urine: 1.018 (ref 1.005–1.030)
Squamous Epithelial / HPF: >50 /HPF (ref 0–5)
pH: 5 (ref 5.0–8.0)

## 2024-01-20 LAB — CBG MONITORING, ED
Glucose-Capillary: 360 mg/dL — ABNORMAL HIGH (ref 70–99)
Glucose-Capillary: 392 mg/dL — ABNORMAL HIGH (ref 70–99)

## 2024-01-20 LAB — CBC WITH DIFFERENTIAL/PLATELET
Abs Immature Granulocytes: 0.07 10*3/uL (ref 0.00–0.07)
Basophils Absolute: 0 10*3/uL (ref 0.0–0.1)
Basophils Relative: 0 %
Eosinophils Absolute: 0.1 10*3/uL (ref 0.0–0.5)
Eosinophils Relative: 1 %
HCT: 32.8 % — ABNORMAL LOW (ref 36.0–46.0)
Hemoglobin: 9.8 g/dL — ABNORMAL LOW (ref 12.0–15.0)
Immature Granulocytes: 1 %
Lymphocytes Relative: 15 %
Lymphs Abs: 1.5 10*3/uL (ref 0.7–4.0)
MCH: 24.3 pg — ABNORMAL LOW (ref 26.0–34.0)
MCHC: 29.9 g/dL — ABNORMAL LOW (ref 30.0–36.0)
MCV: 81.2 fL (ref 80.0–100.0)
Monocytes Absolute: 0.8 10*3/uL (ref 0.1–1.0)
Monocytes Relative: 8 %
Neutro Abs: 7.5 10*3/uL (ref 1.7–7.7)
Neutrophils Relative %: 75 %
Platelets: 595 10*3/uL — ABNORMAL HIGH (ref 150–400)
RBC: 4.04 MIL/uL (ref 3.87–5.11)
RDW: 14.3 % (ref 11.5–15.5)
WBC: 10 10*3/uL (ref 4.0–10.5)
nRBC: 0 % (ref 0.0–0.2)

## 2024-01-20 LAB — COMPREHENSIVE METABOLIC PANEL WITH GFR
ALT: 29 U/L (ref 0–44)
AST: 40 U/L (ref 15–41)
Albumin: 2.1 g/dL — ABNORMAL LOW (ref 3.5–5.0)
Alkaline Phosphatase: 141 U/L — ABNORMAL HIGH (ref 38–126)
Anion gap: 13 (ref 5–15)
BUN: 12 mg/dL (ref 8–23)
CO2: 24 mmol/L (ref 22–32)
Calcium: 8.8 mg/dL — ABNORMAL LOW (ref 8.9–10.3)
Chloride: 103 mmol/L (ref 98–111)
Creatinine, Ser: 1.43 mg/dL — ABNORMAL HIGH (ref 0.44–1.00)
GFR, Estimated: 41 mL/min — ABNORMAL LOW (ref >60)
Glucose, Bld: 401 mg/dL — ABNORMAL HIGH (ref 70–99)
Potassium: 3.6 mmol/L (ref 3.5–5.1)
Sodium: 140 mmol/L (ref 135–145)
Total Bilirubin: 0.6 mg/dL (ref 0.0–1.2)
Total Protein: 6.8 g/dL (ref 6.5–8.1)

## 2024-01-20 LAB — I-STAT VENOUS BLOOD GAS, ED
Acid-Base Excess: 4 mmol/L — ABNORMAL HIGH (ref 0.0–2.0)
Bicarbonate: 28 mmol/L (ref 20.0–28.0)
Calcium, Ion: 1.06 mmol/L — ABNORMAL LOW (ref 1.15–1.40)
HCT: 31 % — ABNORMAL LOW (ref 36.0–46.0)
Hemoglobin: 10.5 g/dL — ABNORMAL LOW (ref 12.0–15.0)
O2 Saturation: 88 %
Potassium: 5.5 mmol/L — ABNORMAL HIGH (ref 3.5–5.1)
Sodium: 138 mmol/L (ref 135–145)
TCO2: 29 mmol/L (ref 22–32)
pCO2, Ven: 41.4 mmHg — ABNORMAL LOW (ref 44–60)
pH, Ven: 7.438 — ABNORMAL HIGH (ref 7.25–7.43)
pO2, Ven: 53 mmHg — ABNORMAL HIGH (ref 32–45)

## 2024-01-20 LAB — LIPASE, BLOOD: Lipase: 27 U/L (ref 11–51)

## 2024-01-20 MED ORDER — INSULIN ASPART 100 UNIT/ML IJ SOLN
5.0000 [IU] | Freq: Once | INTRAMUSCULAR | Status: AC
  Administered 2024-01-20: 5 [IU] via SUBCUTANEOUS

## 2024-01-20 MED ORDER — ONDANSETRON HCL 4 MG/2ML IJ SOLN
4.0000 mg | Freq: Once | INTRAMUSCULAR | Status: AC
  Administered 2024-01-20: 4 mg via INTRAVENOUS
  Filled 2024-01-20: qty 2

## 2024-01-20 NOTE — ED Provider Notes (Signed)
 [place] EMERGENCY DEPARTMENT AT [facility] Provider Note   CSN: [id number] Arrival date & time: 01/20/24  1845    History  Chief Complaint  Patient presents with   Emesis   Cough    Nicole Golden is a 63 y.o. female here for evaluation of persistent nausea and vomiting.  Patient states been ongoing over the last 2 months.  She has been seen in the emergency department multiple times for similar. she had CT imaging earlier in the month.  She was seen at [facility] ED yesterday and prescribed Reglan  and Phenergan suppositories.  Has not started it.  She does have a history of diabetes has no prior history of gastroparesis that she is aware of.  Today she developed some nausea and vomiting.  Was subsequently able to eat potatoes and gravy afterwards.  She comes in here for reevaluation.  No fever, chest pain, shortness of breath, hemoptysis, hematemesis, abdominal pain, diarrhea, dysuria or hematuria.  She is on a GLP-1 however states she has been on this for years without any difficulty.  Has not followed with a GI provider.  HPI     Home Medications Prior to Admission medications   Medication Sig Start Date End Date Taking? Authorizing Provider  amLODipine  (NORVASC ) 10 MG tablet Take 10 mg by mouth in the morning.    [provider]  amoxicillin -clavulanate (AUGMENTIN ) 875-125 MG tablet Take 1 tablet by mouth 2 (two) times daily. 12/18/23   Orlie Bjornstad, MD  aspirin  EC 81 MG tablet Take 81 mg by mouth daily. Swallow whole.    [provider]  carvedilol (COREG) 6.25 MG tablet Take 6.25 mg by mouth. 12/13/23 01/12/24  [provider]  Cholecalciferol  (VITAMIN D3) 50 MCG (2000 UT) TABS Take 2,000 Units by mouth in the morning.    [provider]  Cyanocobalamin  (VITAMIN B-12 PO) Take 1 tablet by mouth in the morning. Gummy    [provider]  diltiazem (CARDIZEM CD) 180 MG 24 hr capsule Take 180 mg by mouth. 12/13/23 01/16/25   [provider]  dorzolamide -timolol  (COSOPT ) 22.3-6.8 MG/ML ophthalmic solution Place 1 drop into both eyes in the morning. Patient not taking: Reported on 12/18/2023 08/06/20   [provider]  doxycycline  (VIBRAMYCIN ) 100 MG capsule Take 1 capsule (100 mg total) by mouth 2 (two) times daily. 12/18/23   Orlie Bjornstad, MD  LANTUS  SOLOSTAR 100 UNIT/ML Solostar Pen Inject 40 Units into the skin 2 (two) times daily. Patient taking differently: Inject 60 Units into the skin 2 (two) times daily. 03/08/21   Gwendalyn Lemma, MD  losartan  (COZAAR ) 100 MG tablet Take 100 mg by mouth in the morning. 06/23/21   [provider]  metoCLOPramide  (REGLAN ) 10 MG tablet Take 1 tablet (10 mg total) by mouth every 6 (six) hours as needed for nausea. 01/04/24   Mordecai Applebaum, MD  MOUNJARO 2.5 MG/0.5ML Pen Inject 2.5 mg into the skin every Sunday. 08/16/21   [provider]  mupirocin  cream (BACTROBAN ) 2 % Apply 1 Application topically 2 (two) times daily. Patient not taking: Reported on 12/18/2023 05/03/23   Early Glisson, MD  NOVOLOG  FLEXPEN 100 UNIT/ML FlexPen Inject 15 Units into the skin 3 (three) times daily before meals. 03/04/20   [provider]  rosuvastatin  (CRESTOR ) 40 MG tablet Take 40 mg by mouth in the morning. Patient not taking: Reported on 12/18/2023    [provider]      Allergies    No  healthtouch food allergies    Review of Systems   Review of Systems  Constitutional: Negative.   HENT: Negative.    Respiratory: Negative.    Cardiovascular: Negative.   Gastrointestinal:  Positive for nausea and vomiting. Negative for abdominal distention, abdominal pain, blood in stool, constipation and diarrhea.  Genitourinary: Negative.   Musculoskeletal: Negative.   Skin: Negative.   Neurological: Negative.   All other systems reviewed and are negative.   Physical Exam Updated Vital Signs BP (!) 166/80   Pulse 100   Temp 98.1 F (36.7 C)    Resp 18   Ht 5\' 7"  (1.702 m)   Wt 80.3 kg   SpO2 98%   BMI 27.73 kg/m  Physical Exam Vitals and nursing note reviewed.  Constitutional:      General: She is not in acute distress.    Appearance: She is well-developed. She is not ill-appearing, toxic-appearing or diaphoretic.  HENT:     Head: Atraumatic.     Nose: Nose normal.     Mouth/Throat:     Mouth: Mucous membranes are moist.  Eyes:     Pupils: Pupils are equal, round, and reactive to light.  Cardiovascular:     Rate and Rhythm: Normal rate.     Pulses: Normal pulses.     Heart sounds: Normal heart sounds.  Pulmonary:     Effort: Pulmonary effort is normal. No respiratory distress.     Breath sounds: Normal breath sounds.  Abdominal:     General: Bowel sounds are normal. There is no distension.     Palpations: Abdomen is soft.     Tenderness: There is no abdominal tenderness. There is no right CVA tenderness, left CVA tenderness, guarding or rebound.  Musculoskeletal:        General: Normal range of motion.     Cervical back: Normal range of motion.  Skin:    General: Skin is warm and dry.     Capillary Refill: Capillary refill takes less than 2 seconds.  Neurological:     General: No focal deficit present.     Mental Status: She is alert.  Psychiatric:        Mood and Affect: Mood normal.     ED Results / Procedures / Treatments   Labs (all labs ordered are listed, but only abnormal results are displayed) Labs Reviewed  CBC WITH DIFFERENTIAL/PLATELET - Abnormal; Notable for the following components:      Result Value   Hemoglobin 9.8 (*)    HCT 32.8 (*)    MCH 24.3 (*)    MCHC 29.9 (*)    Platelets 595 (*)    All other components within normal limits  COMPREHENSIVE METABOLIC PANEL WITH GFR - Abnormal; Notable for the following components:   Glucose, Bld 401 (*)    Creatinine, Ser 1.43 (*)    Calcium  8.8 (*)    Albumin 2.1 (*)    Alkaline Phosphatase 141 (*)    GFR, Estimated 41 (*)    All other  components within normal limits  URINALYSIS, W/ REFLEX TO CULTURE (INFECTION SUSPECTED) - Abnormal; Notable for the following components:   Color, Urine AMBER (*)    APPearance CLOUDY (*)    Glucose, UA >=500 (*)    Hgb urine dipstick SMALL (*)    Protein, ur >=300 (*)    Bacteria, UA MANY (*)    All other components within normal limits  CBG MONITORING, ED - Abnormal; Notable for the following components:  Glucose-Capillary 360 (*)    All other components within normal limits  I-STAT VENOUS BLOOD GAS, ED - Abnormal; Notable for the following components:   pH, Ven 7.438 (*)    pCO2, Ven 41.4 (*)    pO2, Ven 53 (*)    Acid-Base Excess 4.0 (*)    Potassium 5.5 (*)    Calcium , Ion 1.06 (*)    HCT 31.0 (*)    Hemoglobin 10.5 (*)    All other components within normal limits  CBG MONITORING, ED - Abnormal; Notable for the following components:   Glucose-Capillary 392 (*)    All other components within normal limits  URINE CULTURE  LIPASE, BLOOD    EKG None  Radiology No results found.  Procedures Procedures    Medications Ordered in ED Medications  ondansetron  (ZOFRAN ) injection 4 mg (4 mg Intravenous Given 01/20/24 2022)  insulin  aspart (novoLOG ) injection 5 Units (5 Units Subcutaneous Given 01/20/24 2125)    ED Course/ Medical Decision Making/ A&P   Here for evaluation nausea and vomiting.  This been ongoing issue for patient.  Trying Zofran  at home.  She is some intermittent abdominal pain however none today.  She was seen earlier in the month.  CT scan which did not show significant findings.  She was seen at [facility] yesterday did not have any imaging however was hyperglycemic.  Had an episode of emesis today, has been able to eat since.  Her lungs are clear.  Abdomen soft, nontender.  She is mildly tachycardic.  Will plan on labs and reassess.  She is nauseous however no vomiting currently.  Labs personally viewed and interpreted:  VBG without acidosis, normal  bicarb UA glucosuria, no ketonuria, infection CBC without leukocytosis, has no anemia, chronic Metabolic panel creatinine 1.43, similar to prior alk phos 141, downtrend from prior  Patient reassessed.  Given insulin  for her hyperglycemia however she is actively eating and drinking a regular, not diet soda  Patient with no evidence of DKA, HHS.  She was given Phenergan suppositories and Reglan  by [facility] ED last night.  I encouraged her to take these for any nausea or vomiting.  She has benign abdominal exam.  She is eating and drinking here.  Will place referral to GI as this has been an ongoing issue over the last few months or patient.  Low suspicion for acute process  Patient is nontoxic, nonseptic appearing, in no apparent distress.  Patient's pain and other symptoms adequately managed in emergency department.  Fluid bolus given.  Labs, imaging and vitals reviewed.  Patient does not meet the SIRS or Sepsis criteria.  On repeat exam patient does not have a surgical abdomin and there are no peritoneal signs.  No indication of appendicitis, bowel obstruction, bowel perforation, cholecystitis, diverticulitis.  Patient discharged home with symptomatic treatment and given strict instructions for follow-up with their primary care physician.  I have also discussed reasons to return immediately to the ER.  Patient expresses understanding and agrees with plan.                                 Medical Decision Making Amount and/or Complexity of Data Reviewed Independent Historian: friend External Data Reviewed: labs, radiology and notes. Labs: ordered. Decision-making details documented in ED Course.  Risk OTC drugs. Prescription drug management. Parenteral controlled substances. Decision regarding hospitalization. Diagnosis or treatment significantly limited by social determinants of health.  Final Clinical Impression(s) / ED Diagnoses Final diagnoses:  Nausea and  vomiting, unspecified vomiting type  Hyperglycemia    Rx / DC Orders ED Discharge Orders          Ordered    Ambulatory referral to Gastroenterology        01/20/24 2220              Dickson Founds, PA-C 01/20/24 2230    Kommor, Alyse July, MD 01/22/24 [phone]

## 2024-01-20 NOTE — ED Notes (Signed)
 Pt is eating and drinking now, no signs of nausea or vomiting noticed. Family  member is at the bedside.

## 2024-01-20 NOTE — ED Triage Notes (Signed)
 Pt has been haviing NV and cough for  a month and has been seen multiple times. Pt had emesis this morning but was able to eat mashed potatoes and gravy afterwards. Pt still having regular BM. Denies fevers chills. Denies abd pain, CP SOB

## 2024-01-20 NOTE — Discharge Instructions (Signed)
 It was a pleasure taking care of you here in the emergency department  Continue taking the antiemetics as previously prescribed.  Return for new or worsening symptoms

## 2024-01-22 LAB — URINE CULTURE

## 2024-01-27 NOTE — Progress Notes (Deleted)
 Referring Provider: Forestine Igo, DO Primary Care Physician:  Forestine Igo, DO Primary GI Physician: Dr. Mordechai April  No chief complaint on file.   HPI:   Nicole Golden is a 63 y.o. female with history of diabetes, HTN, constipation, presenting today at the request of [facility] for nausea and vomiting.   She has been seen in the ER 3 times in the month of May with nausea and vomiting.   Acute on chronic kindey injury, hyperglycemia with glucose >400, elevated LFTs, anemia with low iron and iron saturation but elevated ferritin.   CT A/P with contrast 5/9 with no acute findings. Cholelithiasis without cholecystitis. Non-cirrhotic liver. Small to moderate pericardial effusion and small bilateral pleural effusion.   RUQ US  with cholelithiasis without cholecystitis. Right sided pleural effusion.   She has been discharged from the ER with Zofran , phenergan, and Reglan .    Today:  N/V:   IDA:    Elevated LFTs:        Colon cancer screening:  Overdue.  Colonoscopy 10/04/21 with inadequate prep. Recommended repeat in 6 months with extended prep and 2 days of clears.   Past Medical History:  Diagnosis Date   Diabetes mellitus without complication (HCC)    Diabetic neuropathy (HCC)    Diabetic neuropathy (HCC)    Hypertension    Hypertensive retinopathy    OU    Past Surgical History:  Procedure Laterality Date   AMPUTATION TOE Right 03/03/2021   Procedure: AMPUTATION TOE;  Surgeon: Alanda Allegra, MD;  Location: [facility] ORS;  Service: General;  Laterality: Right;   CATARACT EXTRACTION Bilateral    COLONOSCOPY WITH PROPOFOL  N/A 10/04/2021   Procedure: COLONOSCOPY WITH PROPOFOL ;  Surgeon: Vinetta Greening, DO;  Location: [facility] ENDO SUITE;  Service: Endoscopy;  Laterality: N/A;  11:00 / ASA 2   EYE SURGERY Bilateral    Cat Sx   FOOT SURGERY     right foot   TUBAL LIGATION      Current Outpatient Medications  Medication Sig Dispense Refill   amLODipine  (NORVASC ) 10 MG  tablet Take 10 mg by mouth in the morning.     amoxicillin -clavulanate (AUGMENTIN ) 875-125 MG tablet Take 1 tablet by mouth 2 (two) times daily. 60 tablet 1   aspirin  EC 81 MG tablet Take 81 mg by mouth daily. Swallow whole.     carvedilol (COREG) 6.25 MG tablet Take 6.25 mg by mouth.     Cholecalciferol  (VITAMIN D3) 50 MCG (2000 UT) TABS Take 2,000 Units by mouth in the morning.     Cyanocobalamin  (VITAMIN B-12 PO) Take 1 tablet by mouth in the morning. Gummy     diltiazem (CARDIZEM CD) 180 MG 24 hr capsule Take 180 mg by mouth.     dorzolamide -timolol  (COSOPT ) 22.3-6.8 MG/ML ophthalmic solution Place 1 drop into both eyes in the morning. (Patient not taking: Reported on 12/18/2023)     doxycycline  (VIBRAMYCIN ) 100 MG capsule Take 1 capsule (100 mg total) by mouth 2 (two) times daily. 60 capsule 0   LANTUS  SOLOSTAR 100 UNIT/ML Solostar Pen Inject 40 Units into the skin 2 (two) times daily. (Patient taking differently: Inject 60 Units into the skin 2 (two) times daily.) 15 mL 11   losartan  (COZAAR ) 100 MG tablet Take 100 mg by mouth in the morning.     metoCLOPramide  (REGLAN ) 10 MG tablet Take 1 tablet (10 mg total) by mouth every 6 (six) hours as needed for nausea. 30 tablet 0   MOUNJARO  2.5 MG/0.5ML Pen Inject 2.5 mg into the skin every Sunday.     mupirocin  cream (BACTROBAN ) 2 % Apply 1 Application topically 2 (two) times daily. (Patient not taking: Reported on 12/18/2023) 15 g 0   NOVOLOG  FLEXPEN 100 UNIT/ML FlexPen Inject 15 Units into the skin 3 (three) times daily before meals.     rosuvastatin  (CRESTOR ) 40 MG tablet Take 40 mg by mouth in the morning. (Patient not taking: Reported on 12/18/2023)     No current facility-administered medications for this visit.    Allergies as of 01/30/2024 - Review Complete 01/20/2024  Allergen Reaction Noted   No healthtouch food allergies Itching and Rash 10/03/2021    Family History  Problem Relation Age of Onset   Breast cancer Paternal Aunt     Diabetes Father    Colon cancer Neg Hx    Colon polyps Neg Hx     Social History   Socioeconomic History   Marital status: Single    Spouse name: Not on file   Number of children: Not on file   Years of education: Not on file   Highest education level: Not on file  Occupational History   Occupation: [employer]    Comment: in [place], first shift   Tobacco Use   Smoking status: Former   Smokeless tobacco: Never  Advertising account planner   Vaping status: Never Used  Substance and Sexual Activity   Alcohol  use: Not Currently   Drug use: Never   Sexual activity: Not on file  Other Topics Concern   Not on file  Social History Narrative   Not on file   Social Drivers of Health   Financial Resource Strain: Medium Risk (12/12/2023)   Received from [facility]   Overall Financial Resource Strain (CARDIA)    Difficulty of Paying Living Expenses: Somewhat hard  Food Insecurity: No Food Insecurity (12/12/2023)   Received from [facility]   Hunger Vital Sign    Worried About Running Out of Food in the Last Year: Never true    Ran Out of Food in the Last Year: Never true  Transportation Needs: No Transportation Needs (12/12/2023)   Received from [facility]   PRAPARE - Transportation    Lack of Transportation (Medical): No    Lack of Transportation (Non-Medical): No  Physical Activity: Insufficiently Active (12/13/2023)   Received from [facility]   Exercise Vital Sign    Days of Exercise per Week: 7 days    Minutes of Exercise per Session: 10 min  Stress: Stress Concern Present (12/13/2023)   Received from [facility] of Occupational Health - Occupational Stress Questionnaire    Feeling of Stress : Very much  Social Connections: Moderately Isolated (12/13/2023)   Received from [facility]   Social Connection and Isolation Panel [NHANES]    Frequency of Communication with Friends and Family: More than three times a week    Frequency of Social  Gatherings with Friends and Family: More than three times a week    Attends Religious Services: 1 to 4 times per year    Active Member of [employer] or Organizations: No    Attends Banker Meetings: Never    Marital Status: Widowed    Review of Systems: Gen: Denies fever, chills, anorexia. Denies fatigue, weakness, weight loss.  CV: Denies chest pain, palpitations, syncope, peripheral edema, and claudication. Resp: Denies dyspnea at rest, cough, wheezing, coughing up blood, and pleurisy.  GI: Denies vomiting blood, jaundice, and fecal incontinence.   Denies dysphagia or odynophagia. Derm: Denies rash, itching, dry skin Psych: Denies depression, anxiety, memory loss, confusion. No homicidal or suicidal ideation.  Heme: Denies bruising, bleeding, and enlarged lymph nodes.  Physical Exam: There were no vitals taken for this visit. General:   Alert and oriented. No distress noted. Pleasant and cooperative.  Head:  Normocephalic and atraumatic. Eyes:  Conjuctiva clear without scleral icterus. Heart:  S1, S2 present without murmurs appreciated. Lungs:  Clear to auscultation bilaterally. No wheezes, rales, or rhonchi. No distress.  Abdomen:  +BS, soft, non-tender and non-distended. No rebound or guarding. No HSM or masses noted. Msk:  Symmetrical without gross deformities. Normal posture. Extremities:  Without edema. Neurologic:  Alert and  oriented x4 Psych:  Normal mood and affect.    Assessment:     Plan:  ***   Shana Daring, PA-C [facility] 01/30/2024

## 2024-01-30 ENCOUNTER — Ambulatory Visit: Admitting: Gastroenterology

## 2024-02-04 ENCOUNTER — Other Ambulatory Visit: Payer: Self-pay

## 2024-02-04 ENCOUNTER — Emergency Department (HOSPITAL_COMMUNITY)
Admission: EM | Admit: 2024-02-04 | Discharge: 2024-02-04 | Disposition: A | Discharge status: Home / Self Care | Attending: Emergency Medicine | Admitting: Emergency Medicine

## 2024-02-04 ENCOUNTER — Encounter (HOSPITAL_COMMUNITY): Payer: Self-pay | Admitting: Emergency Medicine

## 2024-02-04 DIAGNOSIS — I10 Essential (primary) hypertension: Secondary | ICD-10-CM | POA: Insufficient documentation

## 2024-02-04 DIAGNOSIS — Z79899 Other long term (current) drug therapy: Secondary | ICD-10-CM | POA: Insufficient documentation

## 2024-02-04 DIAGNOSIS — Z7982 Long term (current) use of aspirin: Secondary | ICD-10-CM | POA: Diagnosis not present

## 2024-02-04 DIAGNOSIS — E1165 Type 2 diabetes mellitus with hyperglycemia: Secondary | ICD-10-CM | POA: Diagnosis present

## 2024-02-04 DIAGNOSIS — Z794 Long term (current) use of insulin: Secondary | ICD-10-CM | POA: Insufficient documentation

## 2024-02-04 DIAGNOSIS — R112 Nausea with vomiting, unspecified: Secondary | ICD-10-CM | POA: Diagnosis not present

## 2024-02-04 DIAGNOSIS — R739 Hyperglycemia, unspecified: Secondary | ICD-10-CM

## 2024-02-04 DIAGNOSIS — Z87891 Personal history of nicotine dependence: Secondary | ICD-10-CM | POA: Insufficient documentation

## 2024-02-04 LAB — CBC WITH DIFFERENTIAL/PLATELET
Abs Immature Granulocytes: 0.04 10*3/uL (ref 0.00–0.07)
Basophils Absolute: 0 10*3/uL (ref 0.0–0.1)
Basophils Relative: 1 %
Eosinophils Absolute: 0.1 10*3/uL (ref 0.0–0.5)
Eosinophils Relative: 2 %
HCT: 36.6 % (ref 36.0–46.0)
Hemoglobin: 10.7 g/dL — ABNORMAL LOW (ref 12.0–15.0)
Immature Granulocytes: 1 %
Lymphocytes Relative: 15 %
Lymphs Abs: 1.1 10*3/uL (ref 0.7–4.0)
MCH: 23.7 pg — ABNORMAL LOW (ref 26.0–34.0)
MCHC: 29.2 g/dL — ABNORMAL LOW (ref 30.0–36.0)
MCV: 81 fL (ref 80.0–100.0)
Monocytes Absolute: 0.4 10*3/uL (ref 0.1–1.0)
Monocytes Relative: 5 %
Neutro Abs: 5.6 10*3/uL (ref 1.7–7.7)
Neutrophils Relative %: 76 %
Platelets: 391 10*3/uL (ref 150–400)
RBC: 4.52 MIL/uL (ref 3.87–5.11)
RDW: 15.3 % (ref 11.5–15.5)
WBC: 7.4 10*3/uL (ref 4.0–10.5)
nRBC: 0 % (ref 0.0–0.2)

## 2024-02-04 LAB — COMPREHENSIVE METABOLIC PANEL WITH GFR
ALT: 19 U/L (ref 0–44)
AST: 19 U/L (ref 15–41)
Albumin: 2.7 g/dL — ABNORMAL LOW (ref 3.5–5.0)
Alkaline Phosphatase: 126 U/L (ref 38–126)
Anion gap: 12 (ref 5–15)
BUN: 17 mg/dL (ref 8–23)
CO2: 29 mmol/L (ref 22–32)
Calcium: 9.3 mg/dL (ref 8.9–10.3)
Chloride: 101 mmol/L (ref 98–111)
Creatinine, Ser: 1.1 mg/dL — ABNORMAL HIGH (ref 0.44–1.00)
GFR, Estimated: 56 mL/min — ABNORMAL LOW
Glucose, Bld: 584 mg/dL (ref 70–99)
Potassium: 3.7 mmol/L (ref 3.5–5.1)
Sodium: 142 mmol/L (ref 135–145)
Total Bilirubin: 0.8 mg/dL (ref 0.0–1.2)
Total Protein: 7.4 g/dL (ref 6.5–8.1)

## 2024-02-04 LAB — URINALYSIS, ROUTINE W REFLEX MICROSCOPIC
Bacteria, UA: NONE SEEN
Bilirubin Urine: NEGATIVE
Glucose, UA: >=500 mg/dL — AB
Hgb urine dipstick: NEGATIVE
Ketones, ur: NEGATIVE mg/dL
Leukocytes,Ua: NEGATIVE
Nitrite: NEGATIVE
Protein, ur: 100 mg/dL — AB
Specific Gravity, Urine: 1.012 (ref 1.005–1.030)
pH: 8 (ref 5.0–8.0)

## 2024-02-04 LAB — RAPID URINE DRUG SCREEN, HOSP PERFORMED
Amphetamines: NOT DETECTED
Barbiturates: NOT DETECTED
Benzodiazepines: NOT DETECTED
Cocaine: NOT DETECTED
Opiates: NOT DETECTED
Tetrahydrocannabinol: NOT DETECTED

## 2024-02-04 LAB — CBG MONITORING, ED
Glucose-Capillary: 435 mg/dL — ABNORMAL HIGH (ref 70–99)
Glucose-Capillary: 354 mg/dL — ABNORMAL HIGH (ref 70–99)

## 2024-02-04 LAB — LIPASE, BLOOD: Lipase: 28 U/L (ref 11–51)

## 2024-02-04 LAB — ETHANOL: Alcohol, Ethyl (B): <15 mg/dL (ref <15)

## 2024-02-04 MED ORDER — PROCHLORPERAZINE EDISYLATE 10 MG/2ML IJ SOLN
10.0000 mg | Freq: Once | INTRAMUSCULAR | Status: AC
  Administered 2024-02-04: 10 mg via INTRAVENOUS
  Filled 2024-02-04: qty 2

## 2024-02-04 MED ORDER — ONDANSETRON HCL 4 MG PO TABS
4.0000 mg | ORAL_TABLET | ORAL | 0 refills | Status: AC | PRN
Start: 2024-02-04 — End: ?

## 2024-02-04 MED ORDER — DIPHENHYDRAMINE HCL 50 MG/ML IJ SOLN
25.0000 mg | Freq: Once | INTRAMUSCULAR | Status: AC
  Administered 2024-02-04: 25 mg via INTRAVENOUS
  Filled 2024-02-04: qty 1

## 2024-02-04 MED ORDER — METOCLOPRAMIDE HCL 10 MG PO TABS: 10.0000 mg | ORAL_TABLET | Freq: Four times a day (QID) | ORAL | 0 refills | Status: DC | PRN

## 2024-02-04 MED ORDER — POTASSIUM CHLORIDE CRYS ER 20 MEQ PO TBCR
40.0000 meq | EXTENDED_RELEASE_TABLET | Freq: Once | ORAL | Status: AC
  Administered 2024-02-04: 40 meq via ORAL
  Filled 2024-02-04: qty 2

## 2024-02-04 MED ORDER — SUCRALFATE 1 G PO TABS: 1.0000 g | ORAL_TABLET | Freq: Three times a day (TID) | ORAL | 0 refills | Status: DC

## 2024-02-04 MED ORDER — SODIUM CHLORIDE 0.9 % IV BOLUS
1000.0000 mL | Freq: Once | INTRAVENOUS | Status: AC
  Administered 2024-02-04: 1000 mL via INTRAVENOUS

## 2024-02-04 MED ORDER — INSULIN ASPART 100 UNIT/ML IJ SOLN
10.0000 [IU] | Freq: Once | INTRAMUSCULAR | Status: AC
  Administered 2024-02-04: 10 [IU] via SUBCUTANEOUS
  Filled 2024-02-04: qty 1

## 2024-02-04 MED ORDER — PANTOPRAZOLE SODIUM 40 MG PO TBEC: 40.0000 mg | DELAYED_RELEASE_TABLET | Freq: Every day | ORAL | 0 refills | Status: DC

## 2024-02-04 NOTE — Discharge Instructions (Addendum)
 It was a pleasure caring for you today in the emergency department.  Please follow up with gastroenterology  Please closely monitor your glucose levels  Please return to the emergency department for any worsening or worrisome symptoms.

## 2024-02-04 NOTE — ED Provider Notes (Signed)
 [place] EMERGENCY DEPARTMENT AT [facility] Provider Note  CSN: [id number] Arrival date & time: 02/04/24 8295  Chief Complaint(s) Emesis  HPI Nicole Golden is a 63 y.o. female with past medical history as below, significant for DM, hypertension, pancreatitis, recurrent nausea and vomiting who presents to the ED with complaint of nausea vomiting  Nausea and vomiting intermittently over the past 2 months.  She has attempted to follow-up with gastroenterology but appointments were canceled unfortunately.  She has postprandial nausea and vomiting.  No abdominal pain.  No change to bowel or bladder function.  No THC use.  Sugars have been running high over the past few days.  She has been taking her medications as prescribed  Past Medical History Past Medical History:  Diagnosis Date   Diabetes mellitus without complication (HCC)    Diabetic neuropathy (HCC)    Diabetic neuropathy (HCC)    Hypertension    Hypertensive retinopathy    OU   Patient Active Problem List   Diagnosis Date Noted   Dyslipidemia 09/27/2023   Type 2 diabetes mellitus with peripheral neuropathy (HCC) 09/27/2023   Toe osteomyelitis, right (HCC) 09/26/2023   Encounter for screening colonoscopy 02/15/2022   Gangrene of toe of right foot (HCC)    Sepsis due to Rt Foot cellulitis/Abscess/OsteoMyelitis 03/01/2021   Cellulitis and Abscess and Osteomyelitis in diabetic foot ---Rt 03/01/2021   Gallstones 03/01/2021   Nausea & vomiting 02/28/2021   Essential hypertension 02/28/2021   Idiopathic acute pancreatitis without infection or necrosis    Constipation 05/08/2018   DKA (diabetic ketoacidosis) (HCC) 04/24/2016   Abdominal wall cellulitis 04/24/2016   Type II diabetes mellitus with complication, uncontrolled 04/24/2016   Home Medication(s) Prior to Admission medications   Medication Sig Start Date End Date Taking? Authorizing Provider  ondansetron  (ZOFRAN ) 4 MG tablet Take 1 tablet (4 mg total) by  mouth every 4 (four) hours as needed for nausea or vomiting. 02/04/24  Yes Russella Courts A, DO  pantoprazole (PROTONIX) 40 MG tablet Take 1 tablet (40 mg total) by mouth daily for 14 days. 02/04/24 02/18/24 Yes Russella Courts A, DO  sucralfate (CARAFATE) 1 g tablet Take 1 tablet (1 g total) by mouth with breakfast, with lunch, and with evening meal for 7 days. 02/04/24 02/11/24 Yes Teddi Favors, DO  amLODipine  (NORVASC ) 10 MG tablet Take 10 mg by mouth in the morning.    [provider]  amoxicillin -clavulanate (AUGMENTIN ) 875-125 MG tablet Take 1 tablet by mouth 2 (two) times daily. 12/18/23   Orlie Bjornstad, MD  aspirin  EC 81 MG tablet Take 81 mg by mouth daily. Swallow whole.    [provider]  carvedilol (COREG) 6.25 MG tablet Take 6.25 mg by mouth. 12/13/23 01/12/24  [provider]  Cholecalciferol  (VITAMIN D3) 50 MCG (2000 UT) TABS Take 2,000 Units by mouth in the morning.    [provider]  Cyanocobalamin  (VITAMIN B-12 PO) Take 1 tablet by mouth in the morning. Gummy    [provider]  diltiazem (CARDIZEM CD) 180 MG 24 hr capsule Take 180 mg by mouth. 12/13/23 01/16/25  [provider]  dorzolamide -timolol  (COSOPT ) 22.3-6.8 MG/ML ophthalmic solution Place 1 drop into both eyes in the morning. Patient not taking: Reported on 12/18/2023 08/06/20   [provider]  doxycycline  (VIBRAMYCIN ) 100 MG capsule Take 1 capsule (100 mg total) by mouth 2 (two) times daily. 12/18/23   Orlie Bjornstad, MD  LANTUS  SOLOSTAR 100 UNIT/ML Solostar Pen Inject 40 Units  into the skin 2 (two) times daily. Patient taking differently: Inject 60 Units into the skin 2 (two) times daily. 03/08/21   Gwendalyn Lemma, MD  losartan  (COZAAR ) 100 MG tablet Take 100 mg by mouth in the morning. 06/23/21   [provider]  metoCLOPramide  (REGLAN ) 10 MG tablet Take 1 tablet (10 mg total) by mouth every 6 (six) hours as needed for nausea. 02/04/24   Teddi Favors, DO   MOUNJARO 2.5 MG/0.5ML Pen Inject 2.5 mg into the skin every Sunday. 08/16/21   [provider]  mupirocin  cream (BACTROBAN ) 2 % Apply 1 Application topically 2 (two) times daily. Patient not taking: Reported on 12/18/2023 05/03/23   Early Glisson, MD  NOVOLOG  FLEXPEN 100 UNIT/ML FlexPen Inject 15 Units into the skin 3 (three) times daily before meals. 03/04/20   [provider]  rosuvastatin  (CRESTOR ) 40 MG tablet Take 40 mg by mouth in the morning. Patient not taking: Reported on 12/18/2023    [provider]                                                                                                                                    Past Surgical History Past Surgical History:  Procedure Laterality Date   AMPUTATION TOE Right 03/03/2021   Procedure: AMPUTATION TOE;  Surgeon: Alanda Allegra, MD;  Location: [facility] ORS;  Service: General;  Laterality: Right;   CATARACT EXTRACTION Bilateral    COLONOSCOPY WITH PROPOFOL  N/A 10/04/2021   Procedure: COLONOSCOPY WITH PROPOFOL ;  Surgeon: Vinetta Greening, DO;  Location: [facility] ENDO SUITE;  Service: Endoscopy;  Laterality: N/A;  11:00 / ASA 2   EYE SURGERY Bilateral    Cat Sx   FOOT SURGERY     right foot   TUBAL LIGATION     Family History Family History  Problem Relation Age of Onset   Breast cancer Paternal Aunt    Diabetes Father    Colon cancer Neg Hx    Colon polyps Neg Hx     Social History Social History   Tobacco Use   Smoking status: Former   Smokeless tobacco: Never  Advertising account planner   Vaping status: Never Used  Substance Use Topics   Alcohol  use: Not Currently   Drug use: Never   Allergies No healthtouch food allergies  Review of Systems A thorough review of systems was obtained and all systems are negative except as noted in the HPI and PMH.   Physical Exam Vital Signs  I have reviewed the triage vital signs BP (!) 169/67   Pulse 100   Temp 97.9 F (36.6 C) (Oral)   Resp 16   Ht 5\' 7"  (1.702 m)    Wt 75.3 kg   SpO2 98%   BMI 26.00 kg/m  Physical Exam Vitals and nursing note reviewed.  Constitutional:      General: She is not in acute distress.    Appearance:  Normal appearance.  HENT:     Head: Normocephalic and atraumatic.     Right Ear: External ear normal.     Left Ear: External ear normal.     Nose: Nose normal.     Mouth/Throat:     Mouth: Mucous membranes are moist.  Eyes:     General: No scleral icterus.       Right eye: No discharge.        Left eye: No discharge.  Cardiovascular:     Rate and Rhythm: Normal rate and regular rhythm.     Pulses: Normal pulses.     Heart sounds: Normal heart sounds.  Pulmonary:     Effort: Pulmonary effort is normal. No respiratory distress.     Breath sounds: Normal breath sounds. No stridor.  Abdominal:     General: Abdomen is flat. There is no distension.     Palpations: Abdomen is soft.     Tenderness: There is no abdominal tenderness.  Musculoskeletal:     Cervical back: No rigidity.     Right lower leg: No edema.     Left lower leg: No edema.  Skin:    General: Skin is warm and dry.     Capillary Refill: Capillary refill takes less than 2 seconds.  Neurological:     Mental Status: She is alert.  Psychiatric:        Mood and Affect: Mood normal.        Behavior: Behavior normal. Behavior is cooperative.     ED Results and Treatments Labs (all labs ordered are listed, but only abnormal results are displayed) Labs Reviewed  CBC WITH DIFFERENTIAL/PLATELET - Abnormal; Notable for the following components:      Result Value   Hemoglobin 10.7 (*)    MCH 23.7 (*)    MCHC 29.2 (*)    All other components within normal limits  COMPREHENSIVE METABOLIC PANEL WITH GFR - Abnormal; Notable for the following components:   Glucose, Bld 584 (*)    Creatinine, Ser 1.10 (*)    Albumin 2.7 (*)    GFR, Estimated 56 (*)    All other components within normal limits  URINALYSIS, ROUTINE W REFLEX MICROSCOPIC - Abnormal; Notable  for the following components:   Color, Urine STRAW (*)    Glucose, UA >=500 (*)    Protein, ur 100 (*)    All other components within normal limits  CBG MONITORING, ED - Abnormal; Notable for the following components:   Glucose-Capillary 435 (*)    All other components within normal limits  CBG MONITORING, ED - Abnormal; Notable for the following components:   Glucose-Capillary 354 (*)    All other components within normal limits  LIPASE, BLOOD  RAPID URINE DRUG SCREEN, HOSP PERFORMED  ETHANOL  CBG MONITORING, ED  Radiology No results found.  Pertinent labs & imaging results that were available during my care of the patient were reviewed by me and considered in my medical decision making (see MDM for details).  Medications Ordered in ED Medications  sodium chloride  0.9 % bolus 1,000 mL (0 mLs Intravenous Stopped 02/04/24 1246)  potassium chloride  SA (KLOR-CON  M) CR tablet 40 mEq (40 mEq Oral Given 02/04/24 1111)  insulin  aspart (novoLOG ) injection 10 Units (10 Units Subcutaneous Given 02/04/24 1113)  prochlorperazine  (COMPAZINE ) injection 10 mg (10 mg Intravenous Given 02/04/24 1203)  diphenhydrAMINE  (BENADRYL ) injection 25 mg (25 mg Intravenous Given 02/04/24 1203)                                                                                                                                     Procedures Procedures  (including critical care time)  Medical Decision Making / ED Course    Medical Decision Making:    JONEA BUKOWSKI is a 63 y.o. female with past medical history as below, significant for DM, hypertension, pancreatitis, recurrent nausea and vomiting who presents to the ED with complaint of nausea vomiting. The complaint involves an extensive differential diagnosis and also carries with it a high risk of complications and morbidity.  Serious etiology was  considered. Ddx includes but is not limited to: Gastroparesis, gastroenteritis, cyclical vomiting, viral syndrome, foodborne illness, enteritis, bowel obstruction, etc.  Complete initial physical exam performed, notably the patient was in no acute distress, abdomen nontender.    Reviewed and confirmed nursing documentation for past medical history, family history, social history.  Vital signs reviewed.     Brief summary:  63 year old female history above here with nausea vomiting She reports symptoms unchanged over the past month, unable to see GI due to scheduling issues Abd  soft, nontender, she is HDS   Clinical Course as of 02/04/24 1431  Mon Feb 04, 2024  1252 Able to tolerate po [SG]  1411 Continues to improve [SG]  1412 Anion gap: 12 Hyperglycemia, no DKA [SG]    Clinical Course User Index [SG] Teddi Favors, DO     Labs are reassuring, glucose has improved with insulin .  She is tolerant p.o. difficulty.  No episodes of vomiting while in the ER. Will send refill for metoclopramide , also give Zofran  as needed.  Refill for Carafate.  Advised to continue PPI encouraged bland diet, glycemic control. Follow-up with gastroenterology was encouraged I do not feel emergent imaging is indicated at this time given lack of abdominal pain, improvement of symptoms, stable labs. Regular stooling I am highly suspicious for gastroparesis    Patient in no distress and overall condition improved here in the ED. Detailed discussions were had with the patient/guardian regarding current findings, and need for close f/u with PCP or on call doctor. The patient/guardian has been instructed to return immediately if the symptoms worsen in any way for re-evaluation. Patient/guardian verbalized  understanding and is in agreement with current care plan. All questions answered prior to discharge.        Additional history obtained: -Additional history obtained from na -External records from  outside source obtained and reviewed including: Chart review including previous notes, labs, imaging, consultation notes including  Prior ER visits, home medications, prior labs and imaging   Lab Tests: -I ordered, reviewed, and interpreted labs.   The pertinent results include:   Labs Reviewed  CBC WITH DIFFERENTIAL/PLATELET - Abnormal; Notable for the following components:      Result Value   Hemoglobin 10.7 (*)    MCH 23.7 (*)    MCHC 29.2 (*)    All other components within normal limits  COMPREHENSIVE METABOLIC PANEL WITH GFR - Abnormal; Notable for the following components:   Glucose, Bld 584 (*)    Creatinine, Ser 1.10 (*)    Albumin 2.7 (*)    GFR, Estimated 56 (*)    All other components within normal limits  URINALYSIS, ROUTINE W REFLEX MICROSCOPIC - Abnormal; Notable for the following components:   Color, Urine STRAW (*)    Glucose, UA >=500 (*)    Protein, ur 100 (*)    All other components within normal limits  CBG MONITORING, ED - Abnormal; Notable for the following components:   Glucose-Capillary 435 (*)    All other components within normal limits  CBG MONITORING, ED - Abnormal; Notable for the following components:   Glucose-Capillary 354 (*)    All other components within normal limits  LIPASE, BLOOD  RAPID URINE DRUG SCREEN, HOSP PERFORMED  ETHANOL  CBG MONITORING, ED    Notable for elevated glucose w/o ag  EKG   EKG Interpretation Date/Time:    Ventricular Rate:    PR Interval:    QRS Duration:    QT Interval:    QTC Calculation:   R Axis:      Text Interpretation:           Imaging Studies ordered: na   Medicines ordered and prescription drug management: Meds ordered this encounter  Medications   sodium chloride  0.9 % bolus 1,000 mL   potassium chloride  SA (KLOR-CON  M) CR tablet 40 mEq   insulin  aspart (novoLOG ) injection 10 Units   prochlorperazine  (COMPAZINE ) injection 10 mg   diphenhydrAMINE  (BENADRYL ) injection 25 mg    sucralfate (CARAFATE) 1 g tablet    Sig: Take 1 tablet (1 g total) by mouth with breakfast, with lunch, and with evening meal for 7 days.    Dispense:  21 tablet    Refill:  0   pantoprazole (PROTONIX) 40 MG tablet    Sig: Take 1 tablet (40 mg total) by mouth daily for 14 days.    Dispense:  14 tablet    Refill:  0   metoCLOPramide  (REGLAN ) 10 MG tablet    Sig: Take 1 tablet (10 mg total) by mouth every 6 (six) hours as needed for nausea.    Dispense:  10 tablet    Refill:  0   ondansetron  (ZOFRAN ) 4 MG tablet    Sig: Take 1 tablet (4 mg total) by mouth every 4 (four) hours as needed for nausea or vomiting.    Dispense:  5 tablet    Refill:  0    -I have reviewed the patients home medicines and have made adjustments as needed   Consultations Obtained: na   Cardiac Monitoring: Continuous pulse oximetry interpreted by myself, 99% on ra.  Social Determinants of Health:  Diagnosis or treatment significantly limited by social determinants of health: former smoker   Reevaluation: After the interventions noted above, I reevaluated the patient and found that they have improved  Co morbidities that complicate the patient evaluation  Past Medical History:  Diagnosis Date   Diabetes mellitus without complication (HCC)    Diabetic neuropathy (HCC)    Diabetic neuropathy (HCC)    Hypertension    Hypertensive retinopathy    OU      Dispostion: Disposition decision including need for hospitalization was considered, and patient discharged from emergency department.    Final Clinical Impression(s) / ED Diagnoses Final diagnoses:  Hyperglycemia  Nausea and vomiting, unspecified vomiting type        Teddi Favors, DO 02/04/24 1432

## 2024-02-04 NOTE — ED Triage Notes (Signed)
 Pt presents with c/o n/v x 2 months, reports she has been seen multiple times for same, no relief with nausea medication prescribed, pt reports she has made 2 different appts with GI and they has cancelled the day of her appt both times, denies fever/diarrhea/abd pain

## 2024-02-11 NOTE — H&P (View-Only) (Signed)
 Referring Provider: Tobie Belton, DO Primary Care Physician:  Tobie Belton, DO Primary GI Physician: Dr. Cindie  Chief Complaint  Patient presents with   Emesis    Vomiting for 2 months. Can't keep anything down.      HPI:   Nicole Golden is a 63 y.o. female  with history of diabetes, HTN, constipation, presenting today at the request of [name] for nausea and vomiting.    She has been seen in the ER 3 times in the month of May and once in June with nausea and vomiting.    Laboratory evaluations has shown acute on chronic kindey injury, hyperglycemia with glucose >400, elevated LFTs, anemia with low iron and iron saturation but elevated ferritin.    Most recent labs on 6/9 with stable Hgb at 10.7, normal LFTs, glucose 584, Cr stable at 1.10.    CT A/P with contrast 5/9 with no acute findings. Cholelithiasis without cholecystitis. Non-cirrhotic liver. Small to moderate pericardial effusion and small bilateral pleural effusion.    RUQ US  01/04/24 with cholelithiasis without cholecystitis. Right sided pleural effusion.    She has been discharged from the ER with Zofran , phenergan, Reglan , pantoprazole , and carafate .    Today:  N/V: Vomiting for the last 2 months. Even water  comes up. Nothing cold stays down. Has to always keep something in her mouth because she is constantly nauseated. Vomiting every day. Report she has been losing weight. Watermelon, pasta, mashed potatoes, green beans, sandwich seemed to stay down recently.   Taking zofran  prn, but not scheduled every 8 hours. Will help for a short while.   Vomits foods from the day prior.   Gets full quickly.  No abdominal pain.  No heartburn or reflux.   No issues prior to 2 months ago with N/V.  Reports starting carvedilol recently. No other new medications.  Has been on Mounjaro for more than 6 months.   Checks blood sugar daily. States it is coming down. In 200 range this morning. Had stopped taking her  medications when she was sick and went to the ER.     More than 6 months history of solid food and large pill dysphagia.   IDA:  No brbpr or melena.  No NSAIDs.  Repots chronic history of this.  No iron supplementation.  No Fhx of anemia.  No family history of colon cancer or stomach cancer.    No prior EGD.    Last colonoscopy 10/04/21 with inadequate prep. Recommended repeat in 6 months with extended prep and 2 days of clears.     Past Medical History:  Diagnosis Date   Diabetes mellitus without complication (HCC)    Diabetic neuropathy (HCC)    Diabetic neuropathy (HCC)    Hypertension    Hypertensive retinopathy    OU    Past Surgical History:  Procedure Laterality Date   AMPUTATION TOE Right 03/03/2021   Procedure: AMPUTATION TOE;  Surgeon: Mavis Anes, MD;  Location: [facility];  Service: General;  Laterality: Right;   CATARACT EXTRACTION Bilateral    COLONOSCOPY WITH PROPOFOL  N/A 10/04/2021   Procedure: COLONOSCOPY WITH PROPOFOL ;  Surgeon: Cindie Carlin POUR, DO;  Location: [facility];  Service: Endoscopy;  Laterality: N/A;  11:00 / ASA 2   EYE SURGERY Bilateral    Cat Sx   FOOT SURGERY     right foot   TUBAL LIGATION      Current Outpatient Medications  Medication Sig Dispense Refill  amLODipine  (NORVASC ) 10 MG tablet Take 10 mg by mouth in the morning.     aspirin  EC 81 MG tablet Take 81 mg by mouth daily. Swallow whole.     carvedilol (COREG) 6.25 MG tablet Take 6.25 mg by mouth.     diltiazem (CARDIZEM CD) 180 MG 24 hr capsule Take 180 mg by mouth.     ferrous sulfate  325 (65 FE) MG EC tablet Take 1 tablet (325 mg total) by mouth daily with breakfast. 30 tablet 3   LANTUS  SOLOSTAR 100 UNIT/ML Solostar Pen Inject 40 Units into the skin 2 (two) times daily. 15 mL 11   losartan  (COZAAR ) 100 MG tablet Take 100 mg by mouth in the morning.     metoCLOPramide  (REGLAN ) 5 MG tablet Take 5 mg by mouth 3 (three) times daily.     MOUNJARO 2.5 MG/0.5ML Pen Inject 2.5 mg  into the skin every Sunday.     NOVOLOG  FLEXPEN 100 UNIT/ML FlexPen Inject 15 Units into the skin 3 (three) times daily before meals.     ondansetron  (ZOFRAN -ODT) 4 MG disintegrating tablet Take 4 mg by mouth every 8 (eight) hours as needed for nausea or vomiting.     dorzolamide -timolol  (COSOPT ) 22.3-6.8 MG/ML ophthalmic solution Place 1 drop into both eyes in the morning. (Patient not taking: Reported on 02/13/2024)     mupirocin  cream (BACTROBAN ) 2 % Apply 1 Application topically 2 (two) times daily. (Patient not taking: Reported on 02/13/2024) 15 g 0   ondansetron  (ZOFRAN ) 4 MG tablet Take 1 tablet (4 mg total) by mouth every 4 (four) hours as needed for nausea or vomiting. (Patient not taking: Reported on 02/13/2024) 5 tablet 0   pantoprazole  (PROTONIX ) 40 MG tablet Take 1 tablet (40 mg total) by mouth 2 (two) times daily before a meal. 60 tablet 3   rosuvastatin  (CRESTOR ) 40 MG tablet Take 40 mg by mouth in the morning. (Patient not taking: Reported on 02/13/2024)     No current facility-administered medications for this visit.    Allergies as of 02/13/2024 - Review Complete 02/13/2024  Allergen Reaction Noted   No healthtouch food allergies Itching and Rash 10/03/2021    Family History  Problem Relation Age of Onset   Breast cancer Paternal Aunt    Diabetes Father    Colon cancer Neg Hx    Colon polyps Neg Hx     Social History   Socioeconomic History   Marital status: Single    Spouse name: Not on file   Number of children: Not on file   Years of education: Not on file   Highest education level: Not on file  Occupational History   Occupation: [employer]    Comment: in [place], first shift   Tobacco Use   Smoking status: Former   Smokeless tobacco: Never  Advertising account planner   Vaping status: Never Used  Substance and Sexual Activity   Alcohol  use: Not Currently   Drug use: Never   Sexual activity: Not on file  Other Topics Concern   Not on file  Social History Narrative   Not on  file   Social Drivers of Health   Financial Resource Strain: Medium Risk (12/12/2023)   Received from [facility]   Overall Financial Resource Strain (CARDIA)    Difficulty of Paying Living Expenses: Somewhat hard  Food Insecurity: No Food Insecurity (12/12/2023)   Received from [facility]   Hunger Vital Sign    Within the past 12 months, you  worried that your food would run out before you got the money to buy more.: Never true    Within the past 12 months, the food you bought just didn't last and you didn't have money to get more.: Never true  Transportation Needs: No Transportation Needs (12/12/2023)   Received from [facility] - Transportation    Lack of Transportation (Medical): No    Lack of Transportation (Non-Medical): No  Physical Activity: Insufficiently Active (12/13/2023)   Received from [facility]   Exercise Vital Sign    On average, how many days per week do you engage in moderate to strenuous exercise (like a brisk walk)?: 7 days    On average, how many minutes do you engage in exercise at this level?: 10 min  Stress: Stress Concern Present (12/13/2023)   Received from [facility] - Occupational Stress Questionnaire    Feeling of Stress : Very much  Social Connections: Moderately Isolated (12/13/2023)   Received from [facility]   Social Connection and Isolation Panel    In a typical week, how many times do you talk on the phone with family, friends, or neighbors?: More than three times a week    How often do you get together with friends or relatives?: More than three times a week    How often do you attend church or religious services?: 1 to 4 times per year    Do you belong to any clubs or organizations such as church groups, unions, fraternal or athletic groups, or school groups?: No    How often do you attend meetings of the clubs or organizations you belong to?: Never    Are you married,  widowed, divorced, separated, never married, or living with a partner?: Widowed    Review of Systems: Gen: Denies fever, chills, cold or flulike symptoms, presyncope, syncope. CV: Denies chest pain, palpitations. Resp: Denies dyspnea, cough. GI: See HPI Heme: See HPI  Physical Exam: BP 138/82 (BP Location: Right Arm, Patient Position: Sitting, Cuff Size: Normal)   Pulse 99   Temp 97.6 F (36.4 C) (Temporal)   Ht 5' 8 (1.727 m)   Wt 150 lb 6.4 oz (68.2 kg)   BMI 22.87 kg/m  General:   Alert and oriented. No distress noted. Pleasant and cooperative.  Patient actively vomiting clear liquid at the start of our visit. Head:  Normocephalic and atraumatic. Eyes:  Conjuctiva clear without scleral icterus. Heart:  S1, S2 present without murmurs appreciated. Lungs:  Clear to auscultation bilaterally. No wheezes, rales, or rhonchi. No distress.  Abdomen:  +BS, soft, non-tender and non-distended. No rebound or guarding. No HSM or masses noted. Msk:  Symmetrical without gross deformities. Normal posture. Extremities:  Without edema. Neurologic:  Alert and  oriented x4 Psych:  Normal mood and affect.    Assessment:  63 y.o. female  with history of diabetes, HTN, constipation, presenting today at the request of [name] for nausea and vomiting.   Nausea/vomiting/early satiety: Persistent for the last 2 months with daily vomiting.  Reports vomiting foods from the day prior at times.  Associated weight loss.  Suspect symptoms are likely secondary to gastroparesis in the setting of diabetes and likely exacerbated by Mounjaro though she reports being on Mounjaro for more than 6 months.  Evaluated x 3 in the ER in the last month and has been found to have severe hyperglycemia with glucose greater than  400.  CT A/P with contrast with no acute findings.  Cholelithiasis without cholecystitis.  As she denies abdominal pain, I do not think her symptoms are related to cholelithiasis.    She needs  an EGD to evaluate for gastric outlet obstruction, PUD, H. pylori, gastritis, duodenitis, malignancy.  Due to the severity of symptoms, I offered starting patient on Reglan  today, but she declined.  I recommended taking Zofran  scheduled every 6-8 hours before meals and following a soft, bland diet for now.  She needs an EGD as soon as possible, but due to uncontrolled diabetes, I have asked that she reach out to her PCP ASAP to arrange an appointment to address diabetes prior to scheduling EGD.  Notably, patient tells me that she had stopped taking her diabetes medications but has since resumed them and blood sugars are improving.   Dysphagia:  Greater than 48-month history of solid food and large pill dysphagia.  Denies heartburn.  Differentials include esophageal web, ring, stricture, EOE, malignancy.  Needs EGD for further evaluation.  IDA: New onset anemia noted in January 2025 with hemoglobin 11.6, previously in the 12 range.  Hemoglobin dropped as low as 8.9 on 01/04/2024 with low iron and iron saturation. Reports never starting iron supplementation, and most recent hemoglobin 10.7 on 02/04/2024.  Denies overt GI bleeding.  Colonoscopy attempted in 2023, but inadequate prep.  No prior EGD.  She needs an EGD and a colonoscopy, but due to severe nausea/vomiting as per above, she would not be able to tolerate colon prep, so we will plan for EGD only and circle back to colonoscopy when able.  Uncontrolled diabetes: Blood sugar recorded greater than 400 during 3 separate ER visits in the month of May and June.  Patient reports that she stopped taking  for diabetes medications due to feeling poorly, but has since resumed them and states her blood sugar is coming down.  I recommended follow-up with PCP ASAP.  Plan:  Requested patient reach out to PCP ASAP for follow-up of uncontrolled diabetes. EGD once diabetes control improves. She will let me know if her blood sugars improve prior to follow-up, and we  can go ahead and proceed with scheduling an upper endoscopy.  Will need colonoscopy once nausea/vomiting improved to where patient can tolerate colon prep. Start Zofran  4 mg 3 times a day before breakfast, lunch, dinner separated by at least 6 hours.  Start pantoprazole  40 mg twice daily, 30 minutes before breakfast and dinner. Bland, soft diet. 4-6 small meals daily. Low-fat diet. Low fiber diet. Patient will let me know if she continues with frequent vomiting, and we can go ahead and start Reglan  for suspected gastroparesis.  She declined starting this today. Start ferrous sulfate  325 mg daily. Follow-up in 8 weeks.   Josette Centers, PA-C [facility] 02/13/2024

## 2024-02-11 NOTE — Progress Notes (Signed)
 Referring Provider: Tobie Belton, DO Primary Care Physician:  Tobie Belton, DO Primary GI Physician: Dr. Cindie  Chief Complaint  Patient presents with   Emesis    Vomiting for 2 months. Can't keep anything down.      HPI:   Nicole Golden is a 63 y.o. female  with history of diabetes, HTN, constipation, presenting today at the request of [name] for nausea and vomiting.    She has been seen in the ER 3 times in the month of May and once in June with nausea and vomiting.    Laboratory evaluations has shown acute on chronic kindey injury, hyperglycemia with glucose >400, elevated LFTs, anemia with low iron and iron saturation but elevated ferritin.    Most recent labs on 6/9 with stable Hgb at 10.7, normal LFTs, glucose 584, Cr stable at 1.10.    CT A/P with contrast 5/9 with no acute findings. Cholelithiasis without cholecystitis. Non-cirrhotic liver. Small to moderate pericardial effusion and small bilateral pleural effusion.    RUQ US  01/04/24 with cholelithiasis without cholecystitis. Right sided pleural effusion.    She has been discharged from the ER with Zofran , phenergan, Reglan , pantoprazole , and carafate .    Today:  N/V: Vomiting for the last 2 months. Even water  comes up. Nothing cold stays down. Has to always keep something in her mouth because she is constantly nauseated. Vomiting every day. Report she has been losing weight. Watermelon, pasta, mashed potatoes, green beans, sandwich seemed to stay down recently.   Taking zofran  prn, but not scheduled every 8 hours. Will help for a short while.   Vomits foods from the day prior.   Gets full quickly.  No abdominal pain.  No heartburn or reflux.   No issues prior to 2 months ago with N/V.  Reports starting carvedilol recently. No other new medications.  Has been on Mounjaro for more than 6 months.   Checks blood sugar daily. States it is coming down. In 200 range this morning. Had stopped taking her  medications when she was sick and went to the ER.     More than 6 months history of solid food and large pill dysphagia.   IDA:  No brbpr or melena.  No NSAIDs.  Repots chronic history of this.  No iron supplementation.  No Fhx of anemia.  No family history of colon cancer or stomach cancer.    No prior EGD.    Last colonoscopy 10/04/21 with inadequate prep. Recommended repeat in 6 months with extended prep and 2 days of clears.     Past Medical History:  Diagnosis Date   Diabetes mellitus without complication (HCC)    Diabetic neuropathy (HCC)    Diabetic neuropathy (HCC)    Hypertension    Hypertensive retinopathy    OU    Past Surgical History:  Procedure Laterality Date   AMPUTATION TOE Right 03/03/2021   Procedure: AMPUTATION TOE;  Surgeon: Mavis Anes, MD;  Location: [facility];  Service: General;  Laterality: Right;   CATARACT EXTRACTION Bilateral    COLONOSCOPY WITH PROPOFOL  N/A 10/04/2021   Procedure: COLONOSCOPY WITH PROPOFOL ;  Surgeon: Cindie Carlin POUR, DO;  Location: [facility];  Service: Endoscopy;  Laterality: N/A;  11:00 / ASA 2   EYE SURGERY Bilateral    Cat Sx   FOOT SURGERY     right foot   TUBAL LIGATION      Current Outpatient Medications  Medication Sig Dispense Refill  amLODipine  (NORVASC ) 10 MG tablet Take 10 mg by mouth in the morning.     aspirin  EC 81 MG tablet Take 81 mg by mouth daily. Swallow whole.     carvedilol (COREG) 6.25 MG tablet Take 6.25 mg by mouth.     diltiazem (CARDIZEM CD) 180 MG 24 hr capsule Take 180 mg by mouth.     ferrous sulfate  325 (65 FE) MG EC tablet Take 1 tablet (325 mg total) by mouth daily with breakfast. 30 tablet 3   LANTUS  SOLOSTAR 100 UNIT/ML Solostar Pen Inject 40 Units into the skin 2 (two) times daily. 15 mL 11   losartan  (COZAAR ) 100 MG tablet Take 100 mg by mouth in the morning.     metoCLOPramide  (REGLAN ) 5 MG tablet Take 5 mg by mouth 3 (three) times daily.     MOUNJARO 2.5 MG/0.5ML Pen Inject 2.5 mg  into the skin every Sunday.     NOVOLOG  FLEXPEN 100 UNIT/ML FlexPen Inject 15 Units into the skin 3 (three) times daily before meals.     ondansetron  (ZOFRAN -ODT) 4 MG disintegrating tablet Take 4 mg by mouth every 8 (eight) hours as needed for nausea or vomiting.     dorzolamide -timolol  (COSOPT ) 22.3-6.8 MG/ML ophthalmic solution Place 1 drop into both eyes in the morning. (Patient not taking: Reported on 02/13/2024)     mupirocin  cream (BACTROBAN ) 2 % Apply 1 Application topically 2 (two) times daily. (Patient not taking: Reported on 02/13/2024) 15 g 0   ondansetron  (ZOFRAN ) 4 MG tablet Take 1 tablet (4 mg total) by mouth every 4 (four) hours as needed for nausea or vomiting. (Patient not taking: Reported on 02/13/2024) 5 tablet 0   pantoprazole  (PROTONIX ) 40 MG tablet Take 1 tablet (40 mg total) by mouth 2 (two) times daily before a meal. 60 tablet 3   rosuvastatin  (CRESTOR ) 40 MG tablet Take 40 mg by mouth in the morning. (Patient not taking: Reported on 02/13/2024)     No current facility-administered medications for this visit.    Allergies as of 02/13/2024 - Review Complete 02/13/2024  Allergen Reaction Noted   No healthtouch food allergies Itching and Rash 10/03/2021    Family History  Problem Relation Age of Onset   Breast cancer Paternal Aunt    Diabetes Father    Colon cancer Neg Hx    Colon polyps Neg Hx     Social History   Socioeconomic History   Marital status: Single    Spouse name: Not on file   Number of children: Not on file   Years of education: Not on file   Highest education level: Not on file  Occupational History   Occupation: [employer]    Comment: in [place], first shift   Tobacco Use   Smoking status: Former   Smokeless tobacco: Never  Advertising account planner   Vaping status: Never Used  Substance and Sexual Activity   Alcohol  use: Not Currently   Drug use: Never   Sexual activity: Not on file  Other Topics Concern   Not on file  Social History Narrative   Not on  file   Social Drivers of Health   Financial Resource Strain: Medium Risk (12/12/2023)   Received from [facility]   Overall Financial Resource Strain (CARDIA)    Difficulty of Paying Living Expenses: Somewhat hard  Food Insecurity: No Food Insecurity (12/12/2023)   Received from [facility]   Hunger Vital Sign    Within the past 12 months, you  worried that your food would run out before you got the money to buy more.: Never true    Within the past 12 months, the food you bought just didn't last and you didn't have money to get more.: Never true  Transportation Needs: No Transportation Needs (12/12/2023)   Received from [facility] - Transportation    Lack of Transportation (Medical): No    Lack of Transportation (Non-Medical): No  Physical Activity: Insufficiently Active (12/13/2023)   Received from [facility]   Exercise Vital Sign    On average, how many days per week do you engage in moderate to strenuous exercise (like a brisk walk)?: 7 days    On average, how many minutes do you engage in exercise at this level?: 10 min  Stress: Stress Concern Present (12/13/2023)   Received from [facility] - Occupational Stress Questionnaire    Feeling of Stress : Very much  Social Connections: Moderately Isolated (12/13/2023)   Received from [facility]   Social Connection and Isolation Panel    In a typical week, how many times do you talk on the phone with family, friends, or neighbors?: More than three times a week    How often do you get together with friends or relatives?: More than three times a week    How often do you attend church or religious services?: 1 to 4 times per year    Do you belong to any clubs or organizations such as church groups, unions, fraternal or athletic groups, or school groups?: No    How often do you attend meetings of the clubs or organizations you belong to?: Never    Are you married,  widowed, divorced, separated, never married, or living with a partner?: Widowed    Review of Systems: Gen: Denies fever, chills, cold or flulike symptoms, presyncope, syncope. CV: Denies chest pain, palpitations. Resp: Denies dyspnea, cough. GI: See HPI Heme: See HPI  Physical Exam: BP 138/82 (BP Location: Right Arm, Patient Position: Sitting, Cuff Size: Normal)   Pulse 99   Temp 97.6 F (36.4 C) (Temporal)   Ht 5' 8 (1.727 m)   Wt 150 lb 6.4 oz (68.2 kg)   BMI 22.87 kg/m  General:   Alert and oriented. No distress noted. Pleasant and cooperative.  Patient actively vomiting clear liquid at the start of our visit. Head:  Normocephalic and atraumatic. Eyes:  Conjuctiva clear without scleral icterus. Heart:  S1, S2 present without murmurs appreciated. Lungs:  Clear to auscultation bilaterally. No wheezes, rales, or rhonchi. No distress.  Abdomen:  +BS, soft, non-tender and non-distended. No rebound or guarding. No HSM or masses noted. Msk:  Symmetrical without gross deformities. Normal posture. Extremities:  Without edema. Neurologic:  Alert and  oriented x4 Psych:  Normal mood and affect.    Assessment:  63 y.o. female  with history of diabetes, HTN, constipation, presenting today at the request of [name] for nausea and vomiting.   Nausea/vomiting/early satiety: Persistent for the last 2 months with daily vomiting.  Reports vomiting foods from the day prior at times.  Associated weight loss.  Suspect symptoms are likely secondary to gastroparesis in the setting of diabetes and likely exacerbated by Mounjaro though she reports being on Mounjaro for more than 6 months.  Evaluated x 3 in the ER in the last month and has been found to have severe hyperglycemia with glucose greater than  400.  CT A/P with contrast with no acute findings.  Cholelithiasis without cholecystitis.  As she denies abdominal pain, I do not think her symptoms are related to cholelithiasis.    She needs  an EGD to evaluate for gastric outlet obstruction, PUD, H. pylori, gastritis, duodenitis, malignancy.  Due to the severity of symptoms, I offered starting patient on Reglan  today, but she declined.  I recommended taking Zofran  scheduled every 6-8 hours before meals and following a soft, bland diet for now.  She needs an EGD as soon as possible, but due to uncontrolled diabetes, I have asked that she reach out to her PCP ASAP to arrange an appointment to address diabetes prior to scheduling EGD.  Notably, patient tells me that she had stopped taking her diabetes medications but has since resumed them and blood sugars are improving.   Dysphagia:  Greater than 48-month history of solid food and large pill dysphagia.  Denies heartburn.  Differentials include esophageal web, ring, stricture, EOE, malignancy.  Needs EGD for further evaluation.  IDA: New onset anemia noted in January 2025 with hemoglobin 11.6, previously in the 12 range.  Hemoglobin dropped as low as 8.9 on 01/04/2024 with low iron and iron saturation. Reports never starting iron supplementation, and most recent hemoglobin 10.7 on 02/04/2024.  Denies overt GI bleeding.  Colonoscopy attempted in 2023, but inadequate prep.  No prior EGD.  She needs an EGD and a colonoscopy, but due to severe nausea/vomiting as per above, she would not be able to tolerate colon prep, so we will plan for EGD only and circle back to colonoscopy when able.  Uncontrolled diabetes: Blood sugar recorded greater than 400 during 3 separate ER visits in the month of May and June.  Patient reports that she stopped taking  for diabetes medications due to feeling poorly, but has since resumed them and states her blood sugar is coming down.  I recommended follow-up with PCP ASAP.  Plan:  Requested patient reach out to PCP ASAP for follow-up of uncontrolled diabetes. EGD once diabetes control improves. She will let me know if her blood sugars improve prior to follow-up, and we  can go ahead and proceed with scheduling an upper endoscopy.  Will need colonoscopy once nausea/vomiting improved to where patient can tolerate colon prep. Start Zofran  4 mg 3 times a day before breakfast, lunch, dinner separated by at least 6 hours.  Start pantoprazole  40 mg twice daily, 30 minutes before breakfast and dinner. Bland, soft diet. 4-6 small meals daily. Low-fat diet. Low fiber diet. Patient will let me know if she continues with frequent vomiting, and we can go ahead and start Reglan  for suspected gastroparesis.  She declined starting this today. Start ferrous sulfate  325 mg daily. Follow-up in 8 weeks.   Josette Centers, PA-C [facility] 02/13/2024

## 2024-02-13 ENCOUNTER — Encounter: Payer: Self-pay | Admitting: Gastroenterology

## 2024-02-13 ENCOUNTER — Other Ambulatory Visit: Payer: Self-pay | Admitting: Internal Medicine

## 2024-02-13 ENCOUNTER — Ambulatory Visit (INDEPENDENT_AMBULATORY_CARE_PROVIDER_SITE_OTHER): Admitting: Gastroenterology

## 2024-02-13 ENCOUNTER — Telehealth: Payer: Self-pay | Admitting: Gastroenterology

## 2024-02-13 VITALS — BP 138/82 | HR 99 | Temp 97.6°F | Ht 68.0 in | Wt 150.4 lb

## 2024-02-13 DIAGNOSIS — D509 Iron deficiency anemia, unspecified: Secondary | ICD-10-CM | POA: Diagnosis not present

## 2024-02-13 DIAGNOSIS — R6881 Early satiety: Secondary | ICD-10-CM

## 2024-02-13 DIAGNOSIS — R112 Nausea with vomiting, unspecified: Secondary | ICD-10-CM | POA: Diagnosis not present

## 2024-02-13 DIAGNOSIS — E1165 Type 2 diabetes mellitus with hyperglycemia: Secondary | ICD-10-CM

## 2024-02-13 DIAGNOSIS — R1310 Dysphagia, unspecified: Secondary | ICD-10-CM | POA: Diagnosis not present

## 2024-02-13 DIAGNOSIS — M869 Osteomyelitis, unspecified: Secondary | ICD-10-CM

## 2024-02-13 DIAGNOSIS — K8020 Calculus of gallbladder without cholecystitis without obstruction: Secondary | ICD-10-CM

## 2024-02-13 MED ORDER — PANTOPRAZOLE SODIUM 40 MG PO TBEC: 40.0000 mg | DELAYED_RELEASE_TABLET | Freq: Two times a day (BID) | ORAL | 3 refills | Status: AC

## 2024-02-13 MED ORDER — FERROUS SULFATE 325 (65 FE) MG PO TBEC
325.0000 mg | DELAYED_RELEASE_TABLET | Freq: Every day | ORAL | 3 refills | Status: DC
Start: 2024-02-13 — End: 2024-03-04

## 2024-02-13 NOTE — Telephone Encounter (Signed)
 Pt was in office today and needed a follow up for 8 weeks put in a recall and also tried to call no answer and could not leave a voicemail due to mailbox full.

## 2024-02-13 NOTE — Patient Instructions (Signed)
 I suspect your nausea and vomiting is secondary to gastroparesis, which is a condition that causes slow stomach emptying.  This can be secondary to uncontrolled diabetes as well as the medication Mounjaro.  However, we need to proceed with an upper endoscopy to rule out other causes of your nausea and vomiting, but I need your blood sugar under better control before we can proceed.  I would like for you to reach out to your primary care doctor ASAP to arrange a follow-up to discuss diabetes management.  Please keep me updated on this and let me know if your blood sugars improved so we can schedule an upper endoscopy.  To help with your current symptoms, I would like for you to take Zofran  4 mg 3 times a day before breakfast, lunch, and dinner.  Be sure to space this out at least 6 hours.  I would also like for you to start pantoprazole  40 mg twice daily 30 minutes before breakfast and 30 minutes before dinner to help suppress the acid in your stomach as this also helps with nausea.  Dietary recommendations: Bland, soft diet. 4-6 small meals daily Low fat diet Low fiber diet (avoid raw fruits and vegetables).   If you continue with frequent nausea/vomiting despite the above recommendations, we can consider starting Reglan  for suspected gastroparesis as we discussed in the office today.   You also have iron deficiency anemia.  I would like for you to start taking ferrous sulfate 325 mg daily.  I will send a prescription to your pharmacy.   I will plan to see back in 8 weeks.  Do not hesitate to call sooner if you have questions or concerns.   Shana Daring, PA-C [facility]

## 2024-02-13 NOTE — Telephone Encounter (Signed)
 Called patient's son to reschedule missed follow up appt with Dr. Zelda Hickman regarding refill request for Amoxicillin . Is scheduled for appt 7/10. Will forward refill request to MD. Per note pt to continue antbx though 5/19

## 2024-02-19 ENCOUNTER — Other Ambulatory Visit: Payer: Self-pay | Admitting: *Deleted

## 2024-02-19 ENCOUNTER — Telehealth: Payer: Self-pay | Admitting: *Deleted

## 2024-02-19 NOTE — Telephone Encounter (Signed)
 OK. Can she provide me what her blood sugars have been for the last 3 days? We also need to verify her medication list and ask if she was started on any additional medications by PCP and include dose and frequency.

## 2024-02-19 NOTE — Telephone Encounter (Signed)
 Pt states she has seen her PCP and blood sugars have improved. She said her PCP told her it was up to provider if she wanted to go ahead and get her scheduled for the EGD.

## 2024-02-19 NOTE — Telephone Encounter (Signed)
 We were holding off on EGD until blood sugar improved. She was to call PCP to follow-up with them ASAP after I saw her on 6/18. If blood sugars significantly improved prior to our planned follow-up, advised that she could call back to let me know. Not sure if she has seen PCP and started on new medications as [place] outside of [place]. Doubt significant improvement however in less than 1 week.

## 2024-02-19 NOTE — Telephone Encounter (Signed)
 Unable to leave message due to mailbox being full.

## 2024-02-19 NOTE — Telephone Encounter (Signed)
 Pt says she will provide the last 3 days of blood sugars, she will bring them by the office. No new medications were started by PCP. I did verify her medications with her. She states she is not taking the iron that was prescribed because it constipates her.

## 2024-02-19 NOTE — Telephone Encounter (Signed)
 Pt called and stated she needed to be scheduled for EGD. I do not have any orders for her. Please advise. Thank you

## 2024-02-22 ENCOUNTER — Encounter: Payer: Self-pay | Admitting: Gastroenterology

## 2024-02-22 NOTE — Telephone Encounter (Signed)
 Spoke with patient.  She provided me with last several days of blood sugars which are as follows: 199, 229, 95, 178, 189, 76.   This does not represent good control of her diabetes which she was made aware of, but is reasonable to proceed with an EGD as blood sugars are remaining below 300 on a daily basis.   Please proceed with scheduling EGD with possible esophageal dilation with Dr. Cindie ASAP.   Dx: Nausea, vomiting, early satiety, weight loss, dysphagia, IDA ASA 3, but ok to schedule in endo room 1/2.  Will need CBG day of. Day prior to EGD: Take one half dose of Lantus  at bedtime. Day of EGD: No morning diabetes medications.

## 2024-02-25 ENCOUNTER — Encounter (INDEPENDENT_AMBULATORY_CARE_PROVIDER_SITE_OTHER): Payer: Self-pay | Admitting: *Deleted

## 2024-02-26 ENCOUNTER — Encounter (INDEPENDENT_AMBULATORY_CARE_PROVIDER_SITE_OTHER): Payer: Self-pay | Admitting: *Deleted

## 2024-02-26 NOTE — Telephone Encounter (Signed)
 Pt has been scheduled for 03/04/24. Went over in detail instructions for procedure and medications to be held. Pt verbalized understanding.

## 2024-03-03 MED ORDER — AMOXICILLIN-POT CLAVULANATE 875-125 MG PO TABS: 1.0000 | ORAL_TABLET | Freq: Two times a day (BID) | ORAL | 0 refills | Status: AC

## 2024-03-03 NOTE — Addendum Note (Signed)
 Addended by: FLORENE BOUCHARD D on: 03/03/2024 01:46 PM   Modules accepted: Orders

## 2024-03-04 ENCOUNTER — Encounter (HOSPITAL_COMMUNITY): Payer: Self-pay | Admitting: Internal Medicine

## 2024-03-04 ENCOUNTER — Encounter (HOSPITAL_COMMUNITY)
Admission: RE | Disposition: A | Discharge status: Home / Self Care | Payer: Self-pay | Source: Home / Self Care | Attending: Internal Medicine

## 2024-03-04 ENCOUNTER — Ambulatory Visit (HOSPITAL_COMMUNITY)
Admission: RE | Admit: 2024-03-04 | Discharge: 2024-03-04 | Disposition: A | Discharge status: Home / Self Care | Attending: Internal Medicine | Admitting: Internal Medicine

## 2024-03-04 ENCOUNTER — Other Ambulatory Visit: Payer: Self-pay

## 2024-03-04 ENCOUNTER — Ambulatory Visit (HOSPITAL_COMMUNITY): Admitting: Certified Registered Nurse Anesthetist

## 2024-03-04 DIAGNOSIS — Z794 Long term (current) use of insulin: Secondary | ICD-10-CM | POA: Diagnosis not present

## 2024-03-04 DIAGNOSIS — Z87891 Personal history of nicotine dependence: Secondary | ICD-10-CM | POA: Insufficient documentation

## 2024-03-04 DIAGNOSIS — E785 Hyperlipidemia, unspecified: Secondary | ICD-10-CM | POA: Diagnosis not present

## 2024-03-04 DIAGNOSIS — R1310 Dysphagia, unspecified: Secondary | ICD-10-CM | POA: Diagnosis not present

## 2024-03-04 DIAGNOSIS — Z79899 Other long term (current) drug therapy: Secondary | ICD-10-CM | POA: Insufficient documentation

## 2024-03-04 DIAGNOSIS — Z5986 Financial insecurity: Secondary | ICD-10-CM | POA: Insufficient documentation

## 2024-03-04 DIAGNOSIS — E1165 Type 2 diabetes mellitus with hyperglycemia: Secondary | ICD-10-CM | POA: Insufficient documentation

## 2024-03-04 DIAGNOSIS — Z7985 Long-term (current) use of injectable non-insulin antidiabetic drugs: Secondary | ICD-10-CM | POA: Diagnosis not present

## 2024-03-04 DIAGNOSIS — D509 Iron deficiency anemia, unspecified: Secondary | ICD-10-CM | POA: Diagnosis not present

## 2024-03-04 DIAGNOSIS — I10 Essential (primary) hypertension: Secondary | ICD-10-CM

## 2024-03-04 DIAGNOSIS — K2950 Unspecified chronic gastritis without bleeding: Secondary | ICD-10-CM

## 2024-03-04 DIAGNOSIS — K2970 Gastritis, unspecified, without bleeding: Secondary | ICD-10-CM | POA: Insufficient documentation

## 2024-03-04 DIAGNOSIS — R112 Nausea with vomiting, unspecified: Secondary | ICD-10-CM | POA: Insufficient documentation

## 2024-03-04 HISTORY — PX: ESOPHAGOGASTRODUODENOSCOPY: SHX5428

## 2024-03-04 HISTORY — PX: ESOPHAGEAL DILATION: SHX303

## 2024-03-04 LAB — GLUCOSE, CAPILLARY: Glucose-Capillary: 210 mg/dL — ABNORMAL HIGH (ref 70–99)

## 2024-03-04 SURGERY — EGD (ESOPHAGOGASTRODUODENOSCOPY)
Anesthesia: General

## 2024-03-04 MED ORDER — LACTATED RINGERS IV SOLN: INTRAVENOUS | Status: DC

## 2024-03-04 MED ORDER — LACTATED RINGERS IV SOLN: INTRAVENOUS | Status: DC | PRN

## 2024-03-04 MED ORDER — LIDOCAINE 2% (20 MG/ML) 5 ML SYRINGE
INTRAMUSCULAR | Status: DC | PRN
  Administered 2024-03-04: 60 mg via INTRAVENOUS

## 2024-03-04 MED ORDER — PROPOFOL 10 MG/ML IV BOLUS
INTRAVENOUS | Status: DC | PRN
  Administered 2024-03-04: 40 mg via INTRAVENOUS
  Administered 2024-03-04: 80 mg via INTRAVENOUS
  Administered 2024-03-04: 30 mg via INTRAVENOUS

## 2024-03-04 NOTE — Interval H&P Note (Signed)
 History and Physical Interval Note:  03/04/2024 8:19 AM  Nicole Golden  has presented today for surgery, with the diagnosis of Nausea, vomiting, early satiety, weight loss, dysphagia, IDA.  The various methods of treatment have been discussed with the patient and family. After consideration of risks, benefits and other options for treatment, the patient has consented to  Procedure(s) with comments: EGD (ESOPHAGOGASTRODUODENOSCOPY) (N/A) - 9:00 am, ok rm 1/2 DILATION, ESOPHAGUS (N/A) as a surgical intervention.  The patient's history has been reviewed, patient examined, no change in status, stable for surgery.  I have reviewed the patient's chart and labs.  Questions were answered to the patient's satisfaction.     [name]

## 2024-03-04 NOTE — Transfer of Care (Signed)
 Immediate Anesthesia Transfer of Care Note  Patient: Nicole Golden  Procedure(s) Performed: EGD (ESOPHAGOGASTRODUODENOSCOPY) DILATION, ESOPHAGUS  Patient Location: Endoscopy Unit  Anesthesia Type:General  Level of Consciousness: drowsy  Airway & Oxygen Therapy: Patient Spontanous Breathing  Post-op Assessment: Report given to RN and Post -op Vital signs reviewed and stable  Post vital signs: Reviewed and stable  Last Vitals:  Vitals Value Taken Time  BP 99/39 03/04/24 08:31  Temp 36.5 C 03/04/24 08:31  Pulse 84 03/04/24 08:31  Resp 16 03/04/24 08:31  SpO2 100 % 03/04/24 08:31    Last Pain:  Vitals:   03/04/24 0831  TempSrc: Oral  PainSc:       Patients Stated Pain Goal: 9 (03/04/24 0721)  Complications: No notable events documented.

## 2024-03-04 NOTE — Op Note (Signed)
 [facility] Patient Name: Nicole Golden Procedure Date: 03/04/2024 7:42 AM MRN: [id number] Date of Birth: July 13, 1961 Attending MD: Carlin POUR. Cindie , [place], [id number] CSN: [id number] Age: 63 Admit Type: Outpatient Procedure:                Upper GI endoscopy Indications:              Dysphagia, Nausea with vomiting Providers:                Carlin POUR. Cindie, DO, Emilee Tubb RN, RN, [place]                            Wilson, Technician Referring MD:              Medicines:                See the Anesthesia note for documentation of the                            administered medications Complications:            No immediate complications. Estimated Blood Loss:     Estimated blood loss was minimal. Procedure:                Pre-Anesthesia Assessment:                           - The anesthesia plan was to use monitored                            anesthesia care (MAC).                           After obtaining informed consent, the endoscope was                            passed under direct vision. Throughout the                            procedure, the patient's blood pressure, pulse, and                            oxygen saturations were monitored continuously. The                            GIF-H190 ([id number]) scope was introduced through the                            mouth, and advanced to the second part of duodenum.                            The upper GI endoscopy was accomplished without                            difficulty. The patient tolerated the procedure                            well. Scope  In: 8:24:17 AM Scope Out: 8:28:59 AM Total Procedure Duration: 0 hours 4 minutes 42 seconds  Findings:      The Z-line was regular and was found 38 cm from the incisors.      No endoscopic abnormality was evident in the esophagus to explain the       patient's complaint of dysphagia. Preparations were made for empiric       dilation. A TTS dilator was passed through the scope.  Dilation with an       18-19-20 mm balloon dilator was performed to 20 mm. Dilation was       performed with a mild resistance at 20 mm. Estimated blood loss was none.      Minimal inflammation was found in the gastric body. Biopsies were taken       with a cold forceps for Helicobacter pylori testing.      The duodenal bulb, first portion of the duodenum and second portion of       the duodenum were normal. Impression:               - Z-line regular, 38 cm from the incisors.                           - Gastritis. Biopsied.                           - Normal duodenal bulb, first portion of the                            duodenum and second portion of the duodenum. Moderate Sedation:      Per Anesthesia Care Recommendation:           - Patient has a contact number available for                            emergencies. The signs and symptoms of potential                            delayed complications were discussed with the                            patient. Return to normal activities tomorrow.                            Written discharge instructions were provided to the                            patient.                           - Resume previous diet.                           - Continue present medications.                           - Await pathology results.                           -  Return to GI clinic in 4 weeks.                           - Use Reglan  (metoclopramide ) 5 mg PO TID.                           - Need to keep diabetes under good control.                           - 4-6 small meals daily, Low fat diet, Low fiber                            diet (avoid raw fruits and vegetables).                           - Consider extended trial off GLP-1 Procedure Code(s):        --- Professional ---                           [phone], Esophagogastroduodenoscopy, flexible,                            transoral; with biopsy, single or multiple Diagnosis Code(s):        ---  Professional ---                           K29.70, Gastritis, unspecified, without bleeding                           R13.10, Dysphagia, unspecified                           R11.2, Nausea with vomiting, unspecified CPT copyright 2022 American Medical Association. All rights reserved. The codes documented in this report are preliminary and upon coder review may  be revised to meet current compliance requirements. Carlin POUR. Cindie, DO Carlin POUR. Cindie, DO 03/04/2024 8:48:14 AM This report has been signed electronically. Number of Addenda: 0

## 2024-03-04 NOTE — Anesthesia Preprocedure Evaluation (Signed)
 Anesthesia Evaluation  Patient identified by MRN, date of birth, ID band Patient awake    Reviewed: Allergy & Precautions, H&P , NPO status , Patient's Chart, lab work & pertinent test results, reviewed documented beta blocker date and time   Airway Mallampati: II  TM Distance: >3 FB Neck ROM: full    Dental no notable dental hx.    Pulmonary neg pulmonary ROS, former smoker   Pulmonary exam normal breath sounds clear to auscultation       Cardiovascular Exercise Tolerance: Good hypertension,  Rhythm:regular Rate:Normal     Neuro/Psych  Neuromuscular disease  negative psych ROS   GI/Hepatic negative GI ROS, Neg liver ROS,,,  Endo/Other  diabetes    Renal/GU negative Renal ROS  negative genitourinary   Musculoskeletal   Abdominal   Peds  Hematology negative hematology ROS (+)   Anesthesia Other Findings   Reproductive/Obstetrics negative OB ROS                              Anesthesia Physical Anesthesia Plan  ASA: 3  Anesthesia Plan: General   Post-op Pain Management:    Induction:   PONV Risk Score and Plan: Propofol  infusion  Airway Management Planned:   Additional Equipment:   Intra-op Plan:   Post-operative Plan:   Informed Consent: I have reviewed the patients History and Physical, chart, labs and discussed the procedure including the risks, benefits and alternatives for the proposed anesthesia with the patient or authorized representative who has indicated his/her understanding and acceptance.     Dental Advisory Given  Plan Discussed with: CRNA  Anesthesia Plan Comments:         Anesthesia Quick Evaluation

## 2024-03-04 NOTE — Discharge Instructions (Addendum)
 EGD Discharge instructions Please read the instructions outlined below and refer to this sheet in the next few weeks. These discharge instructions provide you with general information on caring for yourself after you leave the hospital. Your doctor may also give you specific instructions. While your treatment has been planned according to the most current medical practices available, unavoidable complications occasionally occur. If you have any problems or questions after discharge, please call your doctor. ACTIVITY You may resume your regular activity but move at a slower pace for the next 24 hours.  Take frequent rest periods for the next 24 hours.  Walking will help expel (get rid of) the air and reduce the bloated feeling in your abdomen.  No driving for 24 hours (because of the anesthesia (medicine) used during the test).  You may shower.  Do not sign any important legal documents or operate any machinery for 24 hours (because of the anesthesia used during the test).  NUTRITION Drink plenty of fluids.  You may resume your normal diet.  Begin with a light meal and progress to your normal diet.  Avoid alcoholic beverages for 24 hours or as instructed by your caregiver.  MEDICATIONS You may resume your normal medications unless your caregiver tells you otherwise.  WHAT YOU CAN EXPECT TODAY You may experience abdominal discomfort such as a feeling of fullness or "gas" pains.  FOLLOW-UP Your doctor will discuss the results of your test with you.  SEEK IMMEDIATE MEDICAL ATTENTION IF ANY OF THE FOLLOWING OCCUR: Excessive nausea (feeling sick to your stomach) and/or vomiting.  Severe abdominal pain and distention (swelling).  Trouble swallowing.  Temperature over 101 F (37.8 C).  Rectal bleeding or vomiting of blood.   Your EGD revealed mild amount inflammation in your stomach.  I took biopsies of this to rule out infection with a bacteria called H. pylori.  Await pathology results, my  office will contact you.  Your esophagus was mildly tight.  I did stretch it out today.  Hopefully this helps with your symptoms.  Small bowel was normal.  Continue on metoclopramide  3 times daily.  Gastroparesis recommendations:  4-6 small meals daily Low fat diet Low fiber diet (avoid raw fruits and vegetables).  We may need to consider an extended trial off of your Mounjaro to see if this helps with your symptoms.  It is important you keep your diabetes under good control.  Follow-up in GI office in 4 weeks. Message sent to the office to get this scheduled.   I hope you have a great rest of your week!  Carlin POUR. Cindie, D.O. Gastroenterology and Hepatology [facility]

## 2024-03-05 ENCOUNTER — Encounter (HOSPITAL_COMMUNITY): Payer: Self-pay | Admitting: Internal Medicine

## 2024-03-05 LAB — SURGICAL PATHOLOGY

## 2024-03-05 NOTE — Anesthesia Postprocedure Evaluation (Signed)
 Anesthesia Post Note  Patient: Nicole Golden  Procedure(s) Performed: EGD (ESOPHAGOGASTRODUODENOSCOPY) DILATION, ESOPHAGUS  Patient location during evaluation: Phase II Anesthesia Type: General Level of consciousness: awake Pain management: pain level controlled Vital Signs Assessment: post-procedure vital signs reviewed and stable Respiratory status: spontaneous breathing and respiratory function stable Cardiovascular status: blood pressure returned to baseline and stable Postop Assessment: no headache and no apparent nausea or vomiting Anesthetic complications: no Comments: Late entry   No notable events documented.   Last Vitals:  Vitals:   03/04/24 0837 03/04/24 0842  BP: (!) 102/46 (!) 116/92  Pulse: 81 91  Resp: 18 19  Temp:    SpO2: 99% 100%    Last Pain:  Vitals:   03/04/24 0842  TempSrc:   PainSc: 0-No pain                 [name]

## 2024-03-06 ENCOUNTER — Ambulatory Visit: Admitting: Internal Medicine

## 2024-03-06 ENCOUNTER — Encounter: Payer: Self-pay | Admitting: Gastroenterology

## 2024-03-27 ENCOUNTER — Ambulatory Visit: Payer: Self-pay | Admitting: Internal Medicine

## 2024-03-31 NOTE — Progress Notes (Deleted)
 Referring Provider: Tobie Belton, DO Primary Care Physician:  Tobie Belton, DO Primary GI Physician: Dr. Cindie  No chief complaint on file.   HPI:   Nicole Golden is a 63 y.o. female with history of diabetes, HTN, constipation, presenting today for follow-up of nausea, vomiting, early satiety, dysphagia, and IDA.   Last seen in the office 02/13/2024.   Noted persistent nausea, vomiting, early satiety for the last 2 months.  Associated weight loss.  Felt symptoms were likely secondary to gastroparesis in the setting of uncontrolled diabetes and likely exacerbated by Mounjaro though she had been on this for more than 6 months.  She been evaluated in the ER x 3 in the last month and found to have severe hyperglycemia.  No acute findings on CT.  Cholelithiasis without cholecystitis.  Recommended scheduled Zofran  before meals, EGD once diabetes improved.     Also reported greater than 34-month history of solid food and large pill dysphagia.  Also noted new onset anemia in January 2025 with hemoglobin declining as low as 8.9 in May with iron deficiency.  Hemoglobin had improved to 10.7 on 02/04/2024, but patient stated she never started oral iron.  Denied overt GI bleeding.  Needed EGD and colonoscopy, but patient would not be or tolerate colon prep, so would circle back to this and proceed with EGD when possible. Recommended starting oral iron daily.   EGD 03/04/2024 gastritis biopsied.  Otherwise normal exam.  Recommended starting Reglan  5 mg 3 times daily, consider extended trial off GLP-1.  Pathology showed gastric antral and oxyntic mucosa with slight chronic inflammation.  No H. pylori.  Today:    Past Medical History:  Diagnosis Date   Diabetes mellitus without complication (HCC)    Diabetic neuropathy (HCC)    Diabetic neuropathy (HCC)    Hypertension    Hypertensive retinopathy    OU    Past Surgical History:  Procedure Laterality Date   AMPUTATION TOE Right 03/03/2021    Procedure: AMPUTATION TOE;  Surgeon: Mavis Anes, MD;  Location: [facility];  Service: General;  Laterality: Right;   CATARACT EXTRACTION Bilateral    COLONOSCOPY WITH PROPOFOL  N/A 10/04/2021   Procedure: COLONOSCOPY WITH PROPOFOL ;  Surgeon: Cindie Carlin POUR, DO;  [facility];  Service: Endoscopy;  Laterality: N/A;  11:00 / ASA 2   ESOPHAGEAL DILATION N/A 03/04/2024   Procedure: DILATION, ESOPHAGUS;  Surgeon: Cindie Carlin POUR, DO;  [facility];  Service: Endoscopy;  Laterality: N/A;   ESOPHAGOGASTRODUODENOSCOPY N/A 03/04/2024   Procedure: EGD (ESOPHAGOGASTRODUODENOSCOPY);  Surgeon: Cindie Carlin POUR, DO;  [facility];  Service: Endoscopy;  Laterality: N/A;  9:00 am, ok rm 1/2   EYE SURGERY Bilateral    Cat Sx   FOOT SURGERY     right foot   TUBAL LIGATION      Current Outpatient Medications  Medication Sig Dispense Refill   amLODipine  (NORVASC ) 10 MG tablet Take 10 mg by mouth in the morning.     amoxicillin -clavulanate (AUGMENTIN ) 875-125 MG tablet Take 1 tablet by mouth 2 (two) times daily. 60 tablet 0   aspirin  EC 81 MG tablet Take 81 mg by mouth daily. Swallow whole.     carvedilol (COREG) 6.25 MG tablet Take 6.25 mg by mouth.     diltiazem (CARDIZEM CD) 180 MG 24 hr capsule Take 180 mg by mouth.     dorzolamide -timolol  (COSOPT ) 22.3-6.8 MG/ML ophthalmic solution Place 1 drop into both eyes in the morning.  LANTUS  SOLOSTAR 100 UNIT/ML Solostar Pen Inject 40 Units into the skin 2 (two) times daily. 15 mL 11   losartan  (COZAAR ) 100 MG tablet Take 100 mg by mouth in the morning.     metoCLOPramide  (REGLAN ) 5 MG tablet Take 5 mg by mouth 3 (three) times daily.     MOUNJARO 2.5 MG/0.5ML Pen Inject 2.5 mg into the skin every Sunday.     mupirocin  cream (BACTROBAN ) 2 % Apply 1 Application topically 2 (two) times daily. (Patient not taking: Reported on 02/13/2024) 15 g 0   NOVOLOG  FLEXPEN 100 UNIT/ML FlexPen Inject 15 Units into the skin 3 (three) times daily  before meals.     ondansetron  (ZOFRAN ) 4 MG tablet Take 1 tablet (4 mg total) by mouth every 4 (four) hours as needed for nausea or vomiting. (Patient not taking: Reported on 02/13/2024) 5 tablet 0   ondansetron  (ZOFRAN -ODT) 4 MG disintegrating tablet Take 4 mg by mouth every 8 (eight) hours as needed for nausea or vomiting.     pantoprazole  (PROTONIX ) 40 MG tablet Take 1 tablet (40 mg total) by mouth 2 (two) times daily before a meal. 60 tablet 3   rosuvastatin  (CRESTOR ) 40 MG tablet Take 40 mg by mouth in the morning.     No current facility-administered medications for this visit.    Allergies as of 04/02/2024 - Review Complete 03/04/2024  Allergen Reaction Noted   No healthtouch food allergies Itching and Rash 10/03/2021    Family History  Problem Relation Age of Onset   Breast cancer Paternal Aunt    Diabetes Father    Colon cancer Neg Hx    Colon polyps Neg Hx     Social History   Socioeconomic History   Marital status: Single    Spouse name: Not on file   Number of children: Not on file   Years of education: Not on file   Highest education level: Not on file  Occupational History   Occupation: [employer]    Comment: in [place], first shift   Tobacco Use   Smoking status: Former   Smokeless tobacco: Never  Advertising account planner   Vaping status: Never Used  Substance and Sexual Activity   Alcohol  use: Not Currently   Drug use: Never   Sexual activity: Not on file  Other Topics Concern   Not on file  Social History Narrative   Not on file   Social Drivers of Health   Financial Resource Strain: Medium Risk (12/12/2023)   Received from [facility]   Overall Financial Resource Strain (CARDIA)    Difficulty of Paying Living Expenses: Somewhat hard  Food Insecurity: No Food Insecurity (12/12/2023)   Received from [facility]   Hunger Vital Sign    Within the past 12 months, you worried that your food would run out before you got the money to buy more.: Never true     Within the past 12 months, the food you bought just didn't last and you didn't have money to get more.: Never true  Transportation Needs: No Transportation Needs (12/12/2023)   Received from [facility]   PRAPARE - Transportation    Lack of Transportation (Medical): No    Lack of Transportation (Non-Medical): No  Physical Activity: Insufficiently Active (12/13/2023)   Received from [facility]   Exercise Vital Sign    On average, how many days per week do you engage in moderate to strenuous exercise (like a brisk walk)?: 7 days  On average, how many minutes do you engage in exercise at this level?: 10 min  Stress: Stress Concern Present (12/13/2023)   Received from [facility] - Occupational Stress Questionnaire    Feeling of Stress : Very much  Social Connections: Moderately Isolated (12/13/2023)   Received from [facility]   Social Connection and Isolation Panel    In a typical week, how many times do you talk on the phone with family, friends, or neighbors?: More than three times a week    How often do you get together with friends or relatives?: More than three times a week    How often do you attend church or religious services?: 1 to 4 times per year    Do you belong to any clubs or organizations such as church groups, unions, fraternal or athletic groups, or school groups?: No    How often do you attend meetings of the clubs or organizations you belong to?: Never    Are you married, widowed, divorced, separated, never married, or living with a partner?: Widowed    Review of Systems: Gen: Denies fever, chills, anorexia. Denies fatigue, weakness, weight loss.  CV: Denies chest pain, palpitations, syncope, peripheral edema, and claudication. Resp: Denies dyspnea at rest, cough, wheezing, coughing up blood, and pleurisy. GI: Denies vomiting blood, jaundice, and fecal incontinence.   Denies dysphagia or odynophagia. Derm:  Denies rash, itching, dry skin Psych: Denies depression, anxiety, memory loss, confusion. No homicidal or suicidal ideation.  Heme: Denies bruising, bleeding, and enlarged lymph nodes.  Physical Exam: There were no vitals taken for this visit. General:   Alert and oriented. No distress noted. Pleasant and cooperative.  Head:  Normocephalic and atraumatic. Eyes:  Conjuctiva clear without scleral icterus. Heart:  S1, S2 present without murmurs appreciated. Lungs:  Clear to auscultation bilaterally. No wheezes, rales, or rhonchi. No distress.  Abdomen:  +BS, soft, non-tender and non-distended. No rebound or guarding. No HSM or masses noted. Msk:  Symmetrical without gross deformities. Normal posture. Extremities:  Without edema. Neurologic:  Alert and  oriented x4 Psych:  Normal mood and affect.    Assessment:     Plan:  ***   Josette Centers, PA-C [facility] 04/02/2024

## 2024-04-02 ENCOUNTER — Ambulatory Visit: Admitting: Gastroenterology

## 2024-04-04 ENCOUNTER — Encounter: Payer: Self-pay | Admitting: Gastroenterology

## 2024-05-05 NOTE — Progress Notes (Shared)
 [facility] - Clinic Note  05/14/2024     CHIEF COMPLAINT Patient presents for No chief complaint on file.   HISTORY OF PRESENT ILLNESS: Nicole Golden is a 63 y.o. female who presents to the clinic today for:    Pt states  Referring physician: Tobie Belton, DO [address] [place],  [place] [address]  HISTORICAL INFORMATION:   Selected notes from the MEDICAL RECORD NUMBER Referred by [facility] for DM eye exam   CURRENT MEDICATIONS: Current Outpatient Medications (Ophthalmic Drugs)  Medication Sig   dorzolamide -timolol  (COSOPT ) 22.3-6.8 MG/ML ophthalmic solution Place 1 drop into both eyes in the morning.   No current facility-administered medications for this visit. (Ophthalmic Drugs)   Current Outpatient Medications (Other)  Medication Sig   amLODipine  (NORVASC ) 10 MG tablet Take 10 mg by mouth in the morning.   amoxicillin -clavulanate (AUGMENTIN ) 875-125 MG tablet Take 1 tablet by mouth 2 (two) times daily.   aspirin  EC 81 MG tablet Take 81 mg by mouth daily. Swallow whole.   carvedilol (COREG) 6.25 MG tablet Take 6.25 mg by mouth.   diltiazem (CARDIZEM CD) 180 MG 24 hr capsule Take 180 mg by mouth.   LANTUS  SOLOSTAR 100 UNIT/ML Solostar Pen Inject 40 Units into the skin 2 (two) times daily.   losartan  (COZAAR ) 100 MG tablet Take 100 mg by mouth in the morning.   metoCLOPramide  (REGLAN ) 5 MG tablet Take 5 mg by mouth 3 (three) times daily.   MOUNJARO 2.5 MG/0.5ML Pen Inject 2.5 mg into the skin every Sunday.   mupirocin  cream (BACTROBAN ) 2 % Apply 1 Application topically 2 (two) times daily. (Patient not taking: Reported on 02/13/2024)   NOVOLOG  FLEXPEN 100 UNIT/ML FlexPen Inject 15 Units into the skin 3 (three) times daily before meals.   ondansetron  (ZOFRAN ) 4 MG tablet Take 1 tablet (4 mg total) by mouth every 4 (four) hours as needed for nausea or vomiting. (Patient not taking: Reported on 02/13/2024)   ondansetron  (ZOFRAN -ODT) 4 MG disintegrating  tablet Take 4 mg by mouth every 8 (eight) hours as needed for nausea or vomiting.   pantoprazole  (PROTONIX ) 40 MG tablet Take 1 tablet (40 mg total) by mouth 2 (two) times daily before a meal.   rosuvastatin  (CRESTOR ) 40 MG tablet Take 40 mg by mouth in the morning.   No current facility-administered medications for this visit. (Other)   REVIEW OF SYSTEMS:    ALLERGIES Allergies  Allergen Reactions   No Healthtouch Food Allergies Itching and Rash    All fruits including tomatoes   PAST MEDICAL HISTORY Past Medical History:  Diagnosis Date   Diabetes mellitus without complication (HCC)    Diabetic neuropathy (HCC)    Diabetic neuropathy (HCC)    Hypertension    Hypertensive retinopathy    OU   Past Surgical History:  Procedure Laterality Date   AMPUTATION TOE Right 03/03/2021   Procedure: AMPUTATION TOE;  Surgeon: Mavis Anes, MD;  Location: [facility];  Service: General;  Laterality: Right;   CATARACT EXTRACTION Bilateral    COLONOSCOPY WITH PROPOFOL  N/A 10/04/2021   Procedure: COLONOSCOPY WITH PROPOFOL ;  Surgeon: Cindie Carlin POUR, DO;  [facility];  Service: Endoscopy;  Laterality: N/A;  11:00 / ASA 2   ESOPHAGEAL DILATION N/A 03/04/2024   Procedure: DILATION, ESOPHAGUS;  Surgeon: Cindie Carlin POUR, DO;  [facility];  Service: Endoscopy;  Laterality: N/A;   ESOPHAGOGASTRODUODENOSCOPY N/A 03/04/2024   Procedure: EGD (ESOPHAGOGASTRODUODENOSCOPY);  Surgeon: Cindie Carlin  K, DO;  [facility];  Service: Endoscopy;  Laterality: N/A;  9:00 am, ok rm 1/2   EYE SURGERY Bilateral    Cat Sx   FOOT SURGERY     right foot   TUBAL LIGATION     FAMILY HISTORY Family History  Problem Relation Age of Onset   Breast cancer Paternal Aunt    Diabetes Father    Colon cancer Neg Hx    Colon polyps Neg Hx    SOCIAL HISTORY Social History   Tobacco Use   Smoking status: Former   Smokeless tobacco: Never  Vaping Use   Vaping status: Never Used   Substance Use Topics   Alcohol  use: Not Currently   Drug use: Never       OPHTHALMIC EXAM:  Not recorded    IMAGING AND PROCEDURES  Imaging and Procedures for @TODAY @          ASSESSMENT/PLAN:  No diagnosis found.  1-3. 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 Pigment dispersion syndrome OS  - BCVA 20/30 OS today  - exam shows scattered fine pigment in [facility] and ant vit + Krukenberg spindle -- stable from prior  - IOL OS with mild tilt -- ?etiology of PDS  - IOP okay at 14  - continue drops as directed by Dr. Groat    Prednisolone qd OS, Latanoprost qhs OS, Cosopt  qd OS  - discussed findings, prognosis             - monitor  5,6. Hypertensive retinopathy OU  - discussed importance of tight BP control  - monitor  7. Pseudophakia OU  - s/p CE/IOL both eyes [facility])  - doing well  - ? Mild tilt of PCIOL OS  - monitor  Ophthalmic Meds Ordered this visit:  No orders of the defined types were placed in this encounter.    No follow-ups on file.  There are no Patient Instructions on file for this visit.  This document serves as a record of services personally performed by Redell JUDITHANN Hans, MD, PhD. It was created on their behalf by Almetta Pesa, an ophthalmic technician. The creation of this record is the provider's dictation and/or activities during the visit.    Electronically signed by: Almetta Pesa, OA, 05/05/24  3:05 PM    Redell JUDITHANN Hans, M.D., Ph.D. Diseases & Surgery of the Retina and Vitreous [facility]    Abbreviations: M myopia (nearsighted); A astigmatism; H hyperopia (farsighted); P presbyopia; Mrx spectacle prescription;  CTL contact lenses; OD right eye; OS left eye; OU both eyes  XT exotropia;  ET esotropia; PEK punctate epithelial keratitis; PEE punctate epithelial erosions; DES dry eye syndrome; MGD meibomian gland dysfunction; ATs artificial tears; PFAT's preservative free artificial tears; NSC nuclear sclerotic cataract; PSC posterior subcapsular cataract; ERM epi-retinal membrane; PVD posterior vitreous detachment; RD retinal detachment; DM diabetes mellitus; DR diabetic retinopathy; NPDR non-proliferative diabetic retinopathy; PDR proliferative diabetic retinopathy; CSME clinically significant macular edema; DME diabetic macular edema; dbh dot blot hemorrhages; CWS cotton wool spot; POAG primary open angle glaucoma; C/D cup-to-disc ratio; HVF humphrey visual field; GVF goldmann visual field; OCT optical coherence tomography; IOP intraocular pressure; BRVO Branch retinal vein occlusion; CRVO central retinal vein  occlusion; CRAO central retinal artery occlusion; BRAO branch retinal artery occlusion; RT retinal tear; SB scleral buckle; PPV pars plana vitrectomy; VH Vitreous hemorrhage; PRP panretinal laser photocoagulation; IVK intravitreal kenalog; VMT vitreomacular traction; MH Macular hole;  NVD neovascularization of the disc; NVE neovascularization elsewhere; AREDS age related eye disease study; ARMD age related macular degeneration; POAG primary open angle glaucoma; EBMD epithelial/anterior basement membrane dystrophy; ACIOL anterior chamber intraocular lens; IOL intraocular lens; PCIOL posterior chamber intraocular lens; Phaco/IOL phacoemulsification with intraocular lens placement; PRK photorefractive keratectomy; LASIK laser assisted in situ keratomileusis; HTN hypertension; DM diabetes mellitus; COPD chronic obstructive pulmonary disease

## 2024-05-14 ENCOUNTER — Encounter (INDEPENDENT_AMBULATORY_CARE_PROVIDER_SITE_OTHER): Payer: Medicare PPO | Admitting: Ophthalmology

## 2024-09-11 ENCOUNTER — Encounter (HOSPITAL_BASED_OUTPATIENT_CLINIC_OR_DEPARTMENT_OTHER): Attending: Internal Medicine | Admitting: Internal Medicine

## 2024-09-11 DIAGNOSIS — Z89421 Acquired absence of other right toe(s): Secondary | ICD-10-CM | POA: Insufficient documentation

## 2024-09-11 DIAGNOSIS — E1142 Type 2 diabetes mellitus with diabetic polyneuropathy: Secondary | ICD-10-CM | POA: Insufficient documentation

## 2024-09-11 DIAGNOSIS — E11621 Type 2 diabetes mellitus with foot ulcer: Secondary | ICD-10-CM | POA: Insufficient documentation

## 2024-09-11 DIAGNOSIS — L97512 Non-pressure chronic ulcer of other part of right foot with fat layer exposed: Secondary | ICD-10-CM | POA: Diagnosis not present

## 2024-09-18 ENCOUNTER — Encounter (HOSPITAL_BASED_OUTPATIENT_CLINIC_OR_DEPARTMENT_OTHER): Admitting: Internal Medicine

## 2024-09-29 ENCOUNTER — Encounter (HOSPITAL_BASED_OUTPATIENT_CLINIC_OR_DEPARTMENT_OTHER): Admitting: Internal Medicine

## 2024-10-06 ENCOUNTER — Encounter (HOSPITAL_BASED_OUTPATIENT_CLINIC_OR_DEPARTMENT_OTHER): Admitting: Internal Medicine

## 2024-12-17 ENCOUNTER — Ambulatory Visit: Admitting: Nurse Practitioner
